# Patient Record
Sex: Female | Born: 1950 | Race: Black or African American | Hispanic: No | Smoking: Never smoker
Health system: Southern US, Community
[De-identification: ages and names within clinical notes are randomized; demographics above are authoritative.]

## PROBLEM LIST (undated history)

## (undated) DIAGNOSIS — G20 Parkinson's disease: Secondary | ICD-10-CM

## (undated) DIAGNOSIS — F32A Depression, unspecified: Secondary | ICD-10-CM

## (undated) DIAGNOSIS — G20A1 Parkinson's disease without dyskinesia, without mention of fluctuations: Secondary | ICD-10-CM

---

## 2018-10-24 ENCOUNTER — Ambulatory Visit: Payer: Medicare HMO | Admitting: Family Medicine

## 2019-03-07 ENCOUNTER — Ambulatory Visit: Payer: Medicare HMO | Admitting: Internal Medicine

## 2019-05-30 DIAGNOSIS — I10 Essential (primary) hypertension: Secondary | ICD-10-CM | POA: Diagnosis not present

## 2019-05-30 DIAGNOSIS — Z13220 Encounter for screening for lipoid disorders: Secondary | ICD-10-CM | POA: Diagnosis not present

## 2019-05-30 DIAGNOSIS — Z1329 Encounter for screening for other suspected endocrine disorder: Secondary | ICD-10-CM | POA: Diagnosis not present

## 2019-05-30 DIAGNOSIS — G20 Parkinson's disease: Secondary | ICD-10-CM | POA: Diagnosis not present

## 2019-05-30 DIAGNOSIS — Z131 Encounter for screening for diabetes mellitus: Secondary | ICD-10-CM | POA: Diagnosis not present

## 2019-05-30 DIAGNOSIS — Z7689 Persons encountering health services in other specified circumstances: Secondary | ICD-10-CM | POA: Diagnosis not present

## 2019-06-06 DIAGNOSIS — G20 Parkinson's disease: Secondary | ICD-10-CM | POA: Diagnosis not present

## 2019-06-06 DIAGNOSIS — F3342 Major depressive disorder, recurrent, in full remission: Secondary | ICD-10-CM | POA: Diagnosis not present

## 2019-06-06 DIAGNOSIS — Z7689 Persons encountering health services in other specified circumstances: Secondary | ICD-10-CM | POA: Diagnosis not present

## 2019-06-06 DIAGNOSIS — Z23 Encounter for immunization: Secondary | ICD-10-CM | POA: Diagnosis not present

## 2019-07-07 DIAGNOSIS — G20 Parkinson's disease: Secondary | ICD-10-CM | POA: Diagnosis not present

## 2019-08-19 DIAGNOSIS — Z23 Encounter for immunization: Secondary | ICD-10-CM | POA: Diagnosis not present

## 2019-11-27 DIAGNOSIS — G20 Parkinson's disease: Secondary | ICD-10-CM | POA: Diagnosis not present

## 2019-11-27 DIAGNOSIS — Z7689 Persons encountering health services in other specified circumstances: Secondary | ICD-10-CM | POA: Diagnosis not present

## 2019-11-27 DIAGNOSIS — F3342 Major depressive disorder, recurrent, in full remission: Secondary | ICD-10-CM | POA: Diagnosis not present

## 2019-12-04 DIAGNOSIS — F3340 Major depressive disorder, recurrent, in remission, unspecified: Secondary | ICD-10-CM | POA: Diagnosis not present

## 2019-12-04 DIAGNOSIS — Z780 Asymptomatic menopausal state: Secondary | ICD-10-CM | POA: Diagnosis not present

## 2019-12-04 DIAGNOSIS — Z0000 Encounter for general adult medical examination without abnormal findings: Secondary | ICD-10-CM | POA: Diagnosis not present

## 2019-12-04 DIAGNOSIS — L608 Other nail disorders: Secondary | ICD-10-CM | POA: Diagnosis not present

## 2019-12-04 DIAGNOSIS — G20 Parkinson's disease: Secondary | ICD-10-CM | POA: Diagnosis not present

## 2019-12-04 DIAGNOSIS — R634 Abnormal weight loss: Secondary | ICD-10-CM | POA: Diagnosis not present

## 2019-12-04 DIAGNOSIS — B351 Tinea unguium: Secondary | ICD-10-CM | POA: Diagnosis not present

## 2019-12-23 DIAGNOSIS — M8588 Other specified disorders of bone density and structure, other site: Secondary | ICD-10-CM | POA: Diagnosis not present

## 2020-01-23 DIAGNOSIS — R7989 Other specified abnormal findings of blood chemistry: Secondary | ICD-10-CM | POA: Diagnosis not present

## 2020-03-31 DIAGNOSIS — R634 Abnormal weight loss: Secondary | ICD-10-CM | POA: Diagnosis not present

## 2020-03-31 DIAGNOSIS — Z0000 Encounter for general adult medical examination without abnormal findings: Secondary | ICD-10-CM | POA: Diagnosis not present

## 2020-03-31 DIAGNOSIS — E042 Nontoxic multinodular goiter: Secondary | ICD-10-CM | POA: Diagnosis not present

## 2020-03-31 DIAGNOSIS — B351 Tinea unguium: Secondary | ICD-10-CM | POA: Diagnosis not present

## 2020-03-31 DIAGNOSIS — L608 Other nail disorders: Secondary | ICD-10-CM | POA: Diagnosis not present

## 2020-03-31 DIAGNOSIS — G20 Parkinson's disease: Secondary | ICD-10-CM | POA: Diagnosis not present

## 2020-04-07 DIAGNOSIS — G20 Parkinson's disease: Secondary | ICD-10-CM | POA: Diagnosis not present

## 2020-04-07 DIAGNOSIS — Z1211 Encounter for screening for malignant neoplasm of colon: Secondary | ICD-10-CM | POA: Diagnosis not present

## 2020-04-07 DIAGNOSIS — B351 Tinea unguium: Secondary | ICD-10-CM | POA: Diagnosis not present

## 2020-04-07 DIAGNOSIS — M8580 Other specified disorders of bone density and structure, unspecified site: Secondary | ICD-10-CM | POA: Diagnosis not present

## 2020-04-07 DIAGNOSIS — F3340 Major depressive disorder, recurrent, in remission, unspecified: Secondary | ICD-10-CM | POA: Diagnosis not present

## 2020-06-24 DIAGNOSIS — Z23 Encounter for immunization: Secondary | ICD-10-CM | POA: Diagnosis not present

## 2021-01-06 DIAGNOSIS — Z23 Encounter for immunization: Secondary | ICD-10-CM | POA: Diagnosis not present

## 2021-01-06 DIAGNOSIS — Z7185 Encounter for immunization safety counseling: Secondary | ICD-10-CM | POA: Diagnosis not present

## 2021-01-18 DIAGNOSIS — R7989 Other specified abnormal findings of blood chemistry: Secondary | ICD-10-CM | POA: Diagnosis not present

## 2021-01-18 DIAGNOSIS — Z1159 Encounter for screening for other viral diseases: Secondary | ICD-10-CM | POA: Diagnosis not present

## 2021-01-18 DIAGNOSIS — Z1329 Encounter for screening for other suspected endocrine disorder: Secondary | ICD-10-CM | POA: Diagnosis not present

## 2021-01-18 DIAGNOSIS — Z136 Encounter for screening for cardiovascular disorders: Secondary | ICD-10-CM | POA: Diagnosis not present

## 2021-01-18 DIAGNOSIS — Z125 Encounter for screening for malignant neoplasm of prostate: Secondary | ICD-10-CM | POA: Diagnosis not present

## 2021-01-18 DIAGNOSIS — Z0000 Encounter for general adult medical examination without abnormal findings: Secondary | ICD-10-CM | POA: Diagnosis not present

## 2021-01-18 DIAGNOSIS — Z131 Encounter for screening for diabetes mellitus: Secondary | ICD-10-CM | POA: Diagnosis not present

## 2021-01-18 DIAGNOSIS — E042 Nontoxic multinodular goiter: Secondary | ICD-10-CM | POA: Diagnosis not present

## 2021-02-03 ENCOUNTER — Emergency Department: Payer: PPO

## 2021-02-03 ENCOUNTER — Other Ambulatory Visit: Payer: Self-pay

## 2021-02-03 ENCOUNTER — Inpatient Hospital Stay
Admission: EM | Admit: 2021-02-03 | Discharge: 2021-02-08 | DRG: 565 | Disposition: A | Discharge status: Skilled Nursing Facility | Payer: PPO | Attending: Internal Medicine | Admitting: Internal Medicine

## 2021-02-03 DIAGNOSIS — R52 Pain, unspecified: Secondary | ICD-10-CM | POA: Diagnosis not present

## 2021-02-03 DIAGNOSIS — I2510 Atherosclerotic heart disease of native coronary artery without angina pectoris: Secondary | ICD-10-CM | POA: Diagnosis not present

## 2021-02-03 DIAGNOSIS — M47812 Spondylosis without myelopathy or radiculopathy, cervical region: Secondary | ICD-10-CM | POA: Diagnosis not present

## 2021-02-03 DIAGNOSIS — E559 Vitamin D deficiency, unspecified: Secondary | ICD-10-CM | POA: Diagnosis not present

## 2021-02-03 DIAGNOSIS — G609 Hereditary and idiopathic neuropathy, unspecified: Secondary | ICD-10-CM | POA: Diagnosis not present

## 2021-02-03 DIAGNOSIS — W19XXXD Unspecified fall, subsequent encounter: Secondary | ICD-10-CM | POA: Diagnosis not present

## 2021-02-03 DIAGNOSIS — F329 Major depressive disorder, single episode, unspecified: Secondary | ICD-10-CM | POA: Diagnosis not present

## 2021-02-03 DIAGNOSIS — E860 Dehydration: Secondary | ICD-10-CM | POA: Diagnosis present

## 2021-02-03 DIAGNOSIS — R609 Edema, unspecified: Secondary | ICD-10-CM | POA: Diagnosis not present

## 2021-02-03 DIAGNOSIS — R531 Weakness: Secondary | ICD-10-CM | POA: Diagnosis not present

## 2021-02-03 DIAGNOSIS — R0789 Other chest pain: Secondary | ICD-10-CM | POA: Diagnosis not present

## 2021-02-03 DIAGNOSIS — I739 Peripheral vascular disease, unspecified: Secondary | ICD-10-CM | POA: Diagnosis not present

## 2021-02-03 DIAGNOSIS — E538 Deficiency of other specified B group vitamins: Secondary | ICD-10-CM | POA: Diagnosis not present

## 2021-02-03 DIAGNOSIS — Z79899 Other long term (current) drug therapy: Secondary | ICD-10-CM | POA: Diagnosis not present

## 2021-02-03 DIAGNOSIS — Z20822 Contact with and (suspected) exposure to covid-19: Secondary | ICD-10-CM | POA: Diagnosis present

## 2021-02-03 DIAGNOSIS — R42 Dizziness and giddiness: Secondary | ICD-10-CM | POA: Diagnosis not present

## 2021-02-03 DIAGNOSIS — I6529 Occlusion and stenosis of unspecified carotid artery: Secondary | ICD-10-CM | POA: Diagnosis not present

## 2021-02-03 DIAGNOSIS — S2249XA Multiple fractures of ribs, unspecified side, initial encounter for closed fracture: Secondary | ICD-10-CM | POA: Diagnosis not present

## 2021-02-03 DIAGNOSIS — M8580 Other specified disorders of bone density and structure, unspecified site: Secondary | ICD-10-CM | POA: Diagnosis not present

## 2021-02-03 DIAGNOSIS — M6282 Rhabdomyolysis: Secondary | ICD-10-CM | POA: Diagnosis present

## 2021-02-03 DIAGNOSIS — G20 Parkinson's disease: Secondary | ICD-10-CM | POA: Diagnosis present

## 2021-02-03 DIAGNOSIS — D696 Thrombocytopenia, unspecified: Secondary | ICD-10-CM | POA: Diagnosis not present

## 2021-02-03 DIAGNOSIS — W06XXXA Fall from bed, initial encounter: Secondary | ICD-10-CM | POA: Diagnosis present

## 2021-02-03 DIAGNOSIS — I499 Cardiac arrhythmia, unspecified: Secondary | ICD-10-CM | POA: Diagnosis not present

## 2021-02-03 DIAGNOSIS — S2242XA Multiple fractures of ribs, left side, initial encounter for closed fracture: Secondary | ICD-10-CM

## 2021-02-03 DIAGNOSIS — G9389 Other specified disorders of brain: Secondary | ICD-10-CM | POA: Diagnosis not present

## 2021-02-03 DIAGNOSIS — S0990XA Unspecified injury of head, initial encounter: Secondary | ICD-10-CM | POA: Diagnosis not present

## 2021-02-03 DIAGNOSIS — W19XXXA Unspecified fall, initial encounter: Secondary | ICD-10-CM | POA: Diagnosis present

## 2021-02-03 DIAGNOSIS — G629 Polyneuropathy, unspecified: Secondary | ICD-10-CM | POA: Diagnosis present

## 2021-02-03 DIAGNOSIS — S2242XD Multiple fractures of ribs, left side, subsequent encounter for fracture with routine healing: Secondary | ICD-10-CM | POA: Diagnosis not present

## 2021-02-03 DIAGNOSIS — Y92003 Bedroom of unspecified non-institutional (private) residence as the place of occurrence of the external cause: Secondary | ICD-10-CM | POA: Diagnosis not present

## 2021-02-03 DIAGNOSIS — T796XXA Traumatic ischemia of muscle, initial encounter: Secondary | ICD-10-CM | POA: Diagnosis not present

## 2021-02-03 DIAGNOSIS — F331 Major depressive disorder, recurrent, moderate: Secondary | ICD-10-CM | POA: Diagnosis not present

## 2021-02-03 DIAGNOSIS — M47814 Spondylosis without myelopathy or radiculopathy, thoracic region: Secondary | ICD-10-CM | POA: Diagnosis not present

## 2021-02-03 DIAGNOSIS — S199XXA Unspecified injury of neck, initial encounter: Secondary | ICD-10-CM | POA: Diagnosis not present

## 2021-02-03 DIAGNOSIS — J439 Emphysema, unspecified: Secondary | ICD-10-CM | POA: Diagnosis not present

## 2021-02-03 DIAGNOSIS — Z743 Need for continuous supervision: Secondary | ICD-10-CM | POA: Diagnosis not present

## 2021-02-03 LAB — URINALYSIS, COMPLETE (UACMP) WITH MICROSCOPIC
Bacteria, UA: NONE SEEN
Bilirubin Urine: NEGATIVE
Glucose, UA: NEGATIVE mg/dL
Hgb urine dipstick: NEGATIVE
Ketones, ur: 20 mg/dL — AB
Leukocytes,Ua: NEGATIVE
Nitrite: NEGATIVE
Protein, ur: 100 mg/dL — AB
Specific Gravity, Urine: 1.028 (ref 1.005–1.030)
pH: 5 (ref 5.0–8.0)

## 2021-02-03 LAB — CBC
HCT: 39.1 % (ref 36.0–46.0)
Hemoglobin: 13.5 g/dL (ref 12.0–15.0)
MCH: 30.8 pg (ref 26.0–34.0)
MCHC: 34.5 g/dL (ref 30.0–36.0)
MCV: 89.1 fL (ref 80.0–100.0)
Platelets: 170 10*3/uL (ref 150–400)
RBC: 4.39 MIL/uL (ref 3.87–5.11)
RDW: 13.5 % (ref 11.5–15.5)
WBC: 5.2 10*3/uL (ref 4.0–10.5)
nRBC: 0 % (ref 0.0–0.2)

## 2021-02-03 LAB — TROPONIN I (HIGH SENSITIVITY): Troponin I (High Sensitivity): 5 ng/L (ref <18)

## 2021-02-03 LAB — BASIC METABOLIC PANEL
Anion gap: 11 (ref 5–15)
BUN: 22 mg/dL (ref 8–23)
CO2: 26 mmol/L (ref 22–32)
Calcium: 10 mg/dL (ref 8.9–10.3)
Chloride: 103 mmol/L (ref 98–111)
Creatinine, Ser: 0.71 mg/dL (ref 0.44–1.00)
GFR, Estimated: >60 mL/min (ref >60)
Glucose, Bld: 95 mg/dL (ref 70–99)
Potassium: 3.7 mmol/L (ref 3.5–5.1)
Sodium: 140 mmol/L (ref 135–145)

## 2021-02-03 LAB — RESP PANEL BY RT-PCR (FLU A&B, COVID) ARPGX2
Influenza A by PCR: NEGATIVE
Influenza B by PCR: NEGATIVE
SARS Coronavirus 2 by RT PCR: NEGATIVE

## 2021-02-03 LAB — CK: Total CK: 304 U/L — ABNORMAL HIGH (ref 38–234)

## 2021-02-03 MED ORDER — ACETAMINOPHEN 650 MG RE SUPP: 650.0000 mg | Freq: Four times a day (QID) | RECTAL | Status: DC | PRN

## 2021-02-03 MED ORDER — ACETAMINOPHEN 500 MG PO TABS
1000.0000 mg | ORAL_TABLET | Freq: Once | ORAL | Status: AC
  Administered 2021-02-03: 1000 mg via ORAL
  Filled 2021-02-03: qty 2

## 2021-02-03 MED ORDER — CARBIDOPA-LEVODOPA 25-100 MG PO TABS
1.0000 | ORAL_TABLET | Freq: Three times a day (TID) | ORAL | Status: DC
  Administered 2021-02-03 – 2021-02-08 (×15): 1 via ORAL
  Filled 2021-02-03 (×15): qty 1

## 2021-02-03 MED ORDER — ACETAMINOPHEN 325 MG PO TABS
650.0000 mg | ORAL_TABLET | Freq: Four times a day (QID) | ORAL | Status: DC | PRN
  Administered 2021-02-05 (×2): 650 mg via ORAL
  Filled 2021-02-03 (×2): qty 2

## 2021-02-03 MED ORDER — ONDANSETRON HCL 4 MG/2ML IJ SOLN: 4.0000 mg | Freq: Four times a day (QID) | INTRAMUSCULAR | Status: DC | PRN

## 2021-02-03 MED ORDER — MAGNESIUM HYDROXIDE 400 MG/5ML PO SUSP: 30.0000 mL | Freq: Every day | ORAL | Status: DC | PRN

## 2021-02-03 MED ORDER — ENOXAPARIN SODIUM 40 MG/0.4ML IJ SOSY
40.0000 mg | PREFILLED_SYRINGE | INTRAMUSCULAR | Status: DC
  Administered 2021-02-04 – 2021-02-07 (×5): 40 mg via SUBCUTANEOUS
  Filled 2021-02-03 (×6): qty 0.4

## 2021-02-03 MED ORDER — ONDANSETRON HCL 4 MG PO TABS
4.0000 mg | ORAL_TABLET | Freq: Four times a day (QID) | ORAL | Status: DC | PRN
  Filled 2021-02-03: qty 1

## 2021-02-03 MED ORDER — SODIUM CHLORIDE 0.9 % IV SOLN: INTRAVENOUS | Status: DC

## 2021-02-03 MED ORDER — CARBIDOPA-LEVODOPA 25-100 MG PO TABS: 1.0000 | ORAL_TABLET | Freq: Three times a day (TID) | ORAL | Status: DC

## 2021-02-03 MED ORDER — GABAPENTIN 100 MG PO CAPS
100.0000 mg | ORAL_CAPSULE | Freq: Three times a day (TID) | ORAL | Status: DC
  Administered 2021-02-04 – 2021-02-08 (×14): 100 mg via ORAL
  Filled 2021-02-03 (×14): qty 1

## 2021-02-03 MED ORDER — DULOXETINE HCL 30 MG PO CPEP
30.0000 mg | ORAL_CAPSULE | Freq: Two times a day (BID) | ORAL | Status: DC
  Administered 2021-02-04 – 2021-02-08 (×9): 30 mg via ORAL
  Filled 2021-02-03 (×11): qty 1

## 2021-02-03 MED ORDER — TRAZODONE HCL 50 MG PO TABS
25.0000 mg | ORAL_TABLET | Freq: Every evening | ORAL | Status: DC | PRN
Start: 2021-02-03 — End: 2021-02-08

## 2021-02-03 MED ORDER — MORPHINE SULFATE (PF) 2 MG/ML IV SOLN
2.0000 mg | Freq: Once | INTRAVENOUS | Status: DC
  Filled 2021-02-03: qty 1

## 2021-02-03 MED ORDER — SODIUM CHLORIDE 0.9 % IV BOLUS
1000.0000 mL | Freq: Once | INTRAVENOUS | Status: AC
  Administered 2021-02-03: 1000 mL via INTRAVENOUS

## 2021-02-03 NOTE — ED Triage Notes (Signed)
Please see first nurse note. Sunday rolled off bed injuring left side. Wed when trying to get up pt states unable to get up " like a disconnect like brain not working with the legs". Pt concerned for increased weakness.  Pt states weakness involves both sides. Pt reports intermittent pain on left side from falling out of bed. A&O x 4, NAD noted at this time.

## 2021-02-03 NOTE — ED Provider Notes (Signed)
[facility] Emergency Department Provider Note  ____________________________________________   Event Date/Time   First MD Initiated Contact with Patient 02/03/21 1611     (approximate)  I have reviewed the triage vital signs and the nursing notes.   HISTORY  Chief Complaint No chief complaint on file.    HPI Michele Brewer is a 70 y.o. female here with generalized weakness.  The patient states that for the last 2 days, she has had generalized weakness and difficulty with ambulating.  The patient reportedly fell out of her bed on Sunday.  She had some moderate left sided chest pain after the fall.  Since then, she has had persistent and worsening weakness and feels like she has been leaning to her left" favoring" her left side.  She states that for the last 2 days, she feels like she tries to get up and that her legs essentially just will not work.  She is able to move them, but is unable to muster the strength to stand up.  She has been in a chair for the last 12 hours due to this.  She has not had anything eat or drink because she can get up out of her bed.  Denies any vision changes.  No focal numbness or weakness.  She says she feels generally weak.  No fevers or chills.  No urinary symptoms.  No recent medication changes, though she does admit she has not been taking her Sinemet because she has not been eating or drinking.        History reviewed. No pertinent past medical history.  Patient Active Problem List   Diagnosis Date Noted  . Fall 02/03/2021    History reviewed. No pertinent surgical history.  Prior to Admission medications   Medication Sig Start Date End Date Taking? Authorizing Provider  carbidopa-levodopa (SINEMET IR) 25-100 MG tablet Take 1 tablet by mouth in the morning, at noon, and at bedtime. 09/08/19  Yes [provider]  DULoxetine (CYMBALTA) 30 MG capsule Take 1 capsule by mouth in the morning and at bedtime. 09/08/19  Yes  [provider]  gabapentin (NEURONTIN) 100 MG capsule Take 100 mg by mouth 3 (three) times daily. 09/08/19  Yes [provider]  cyanocobalamin (,VITAMIN B-12,) 1000 MCG/ML injection Inject into the muscle. Patient not taking: No sig reported    [provider]  ergocalciferol (VITAMIN D2) 1.25 MG (50000 UT) capsule Take 1 capsule by mouth once a week. Patient not taking: Reported on 02/03/2021 12/26/19   [provider]  Multiple Vitamin (MULTIVITAMIN) capsule Take 1 capsule by mouth. Take 1 capsule by mouth every other day Patient not taking: No sig reported    [provider]  olive oil external oil Take 1 capsule by mouth daily. Patient not taking: No sig reported    [provider]    Allergies Patient has no known allergies.  History reviewed. No pertinent family history.  Social History Social History   Tobacco Use  . Smoking status: Never Smoker  . Smokeless tobacco: Never Used  Substance Use Topics  . Alcohol use: Not Currently  . Drug use: Never    Review of Systems  Review of Systems  Constitutional: Positive for fatigue. Negative for fever.  HENT: Negative for congestion and sore throat.   Eyes: Negative for visual disturbance.  Respiratory: Negative for cough and shortness of breath.   Cardiovascular: Negative for chest pain.  Gastrointestinal: Negative for abdominal pain, diarrhea, nausea and  vomiting.  Genitourinary: Negative for flank pain.  Musculoskeletal: Positive for gait problem. Negative for back pain and neck pain.  Skin: Negative for rash and wound.  Neurological: Positive for weakness.  All other systems reviewed and are negative.    ____________________________________________  PHYSICAL EXAM:      VITAL SIGNS: ED Triage Vitals  Enc Vitals Group     BP 02/03/21 1427 123/64     Pulse Rate 02/03/21 1427 81     Resp 02/03/21 1427 16     Temp 02/03/21 1427 98.1 F (36.7 C)     Temp Source  02/03/21 1427 Oral     SpO2 02/03/21 1427 100 %     Weight 02/03/21 1439 130 lb (59 kg)     Height 02/03/21 1439 5\' 4"  (1.626 m)     Head Circumference --      Peak Flow --      Pain Score 02/03/21 1439 0     Pain Loc --      Pain Edu? --      Excl. in GC? --      Physical Exam Vitals and nursing note reviewed.  Constitutional:      General: She is not in acute distress.    Appearance: She is well-developed.  HENT:     Head: Normocephalic and atraumatic.     Mouth/Throat:     Mouth: Mucous membranes are dry.  Eyes:     Conjunctiva/sclera: Conjunctivae normal.  Cardiovascular:     Rate and Rhythm: Normal rate and regular rhythm.     Heart sounds: Normal heart sounds.  Pulmonary:     Effort: Pulmonary effort is normal. No respiratory distress.     Breath sounds: No wheezing.  Chest:     Chest wall: Tenderness (marked TTP over left lateral chest wall) present.     Comments: No bruising Abdominal:     General: There is no distension.  Musculoskeletal:     Cervical back: Neck supple.  Skin:    General: Skin is warm.     Capillary Refill: Capillary refill takes less than 2 seconds.     Findings: No rash.  Neurological:     Mental Status: She is alert and oriented to person, place, and time.     Motor: Tremor and abnormal muscle tone (mild rigidity) present.       ____________________________________________   LABS (all labs ordered are listed, but only abnormal results are displayed)  Labs Reviewed  URINALYSIS, COMPLETE (UACMP) WITH MICROSCOPIC - Abnormal; Notable for the following components:      Result Value   Color, Urine YELLOW (*)    APPearance CLEAR (*)    Ketones, ur 20 (*)    Protein, ur 100 (*)    All other components within normal limits  CK - Abnormal; Notable for the following components:   Total CK 304 (*)    All other components within normal limits  RESP PANEL BY RT-PCR (FLU A&B, COVID) [id number]  BASIC METABOLIC PANEL  CBC  BASIC METABOLIC  PANEL  CBC  CK  CBG MONITORING, ED  TROPONIN I (HIGH SENSITIVITY)  TROPONIN I (HIGH SENSITIVITY)    ____________________________________________  EKG: Normal sinus rhythm, VR 95. PR 98, QRS 80, QTc 472. No acute ST elevations or depressions. ________________________________________  RADIOLOGY All imaging, including plain films, CT scans, and ultrasounds, independently reviewed by me, and interpretations confirmed via formal radiology reads.  ED MD interpretation:   CXR: L 7th rib fx  CT Head/C Spine: No acute abnormality, no acute fx, spondylitic changes noted CT Chest: multiple L rib fx, no PTX or HTX  Official radiology report(s): CT Head Wo Contrast  Result Date: 02/03/2021 CLINICAL DATA:  Rolled from bed on Sunday, unable to get up EXAM: CT HEAD WITHOUT CONTRAST CT CERVICAL SPINE WITHOUT CONTRAST TECHNIQUE: Multidetector CT imaging of the head and cervical spine was performed following the standard protocol without intravenous contrast. Multiplanar CT image reconstructions of the cervical spine were also generated. COMPARISON:  None. FINDINGS: CT HEAD FINDINGS Brain: No evidence of acute infarction, hemorrhage, hydrocephalus, extra-axial collection, visible mass lesion or mass effect. Symmetric prominence of the ventricles, cisterns and sulci compatible with parenchymal volume loss with a slight frontal lobe predominance. Patchy areas of white matter hypoattenuation are most compatible with chronic microvascular angiopathy. Scattered dural calcifications. Insert tonsils normally position. Benign intracranial structures are unremarkable. Vascular: Atherosclerotic calcification of the carotid siphons. No hyperdense vessel. Skull: No significant scalp swelling, hematoma or calvarial fracture. Sinuses/Orbits: Paranasal sinuses and mastoid air cells are predominantly clear. Included orbital structures are unremarkable. Other: None CT CERVICAL SPINE FINDINGS Alignment: Stabilization collar  absent at the time of exam. Mild cervical flexion noted scout view. Right lateral bending noted on coronal view. There is slight leftward cranial rotation as well. Reversal the normal cervical lordosis likely related to cervical flexion and positioning. No evidence of traumatic listhesis. No abnormally widened, perched or jumped facets. Normal alignment of the craniocervical and atlantoaxial articulations. Skull base and vertebrae: No acute skull base fracture. No vertebral body fracture or height loss. Normal bone mineralization. Benign bone island C5. No worrisome osseous lesions. Soft tissues and spinal canal: No pre or paravertebral fluid or swelling. No visible canal hematoma. Disc levels: Multilevel intervertebral disc height loss with spondylitic endplate changes. Slightly larger disc osteophyte complex noted C4-5 with partial effacement of the ventral thecal sac but no significant spinal canal. Uncinate spurring and facet hypertrophic changes are noted throughout the cervical levels, resulting in some moderate foraminal narrowing on right C4-5 and bilaterally C6-7. Upper chest: No acute abnormality in the upper chest or imaged lung apices. Other: No concerning thyroid nodules or masses. Punctate focus of intravenous gas in the left brachiocephalic vein likely related to intravenous access. IMPRESSION: 1. No acute intracranial abnormality. Calvarial fracture or significant swelling. 2. 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 No acute fracture or traumatic listhesis of the cervical spine. 4. Cervical spondylitic changes as described above. Electronically Signed   By: Kreg Shropshire M.D.   On: 02/03/2021 17:53   CT Chest Wo Contrast  Result Date: 02/03/2021 CLINICAL DATA:  Recent fall with left rib fracture on recent plain film, initial encounter EXAM: CT CHEST WITHOUT CONTRAST TECHNIQUE: Multidetector CT imaging of the chest was performed following the standard protocol without  IV contrast. COMPARISON:  Chest x-ray from earlier in the same day. FINDINGS: Cardiovascular: Somewhat limited due to lack of IV contrast. Aortic calcifications are noted. Coronary calcifications are seen. No cardiac enlargement is noted. Mediastinum/Nodes: Thoracic inlet is within normal limits. No sizable hilar or mediastinal adenopathy is noted. The esophagus as visualized is within normal limits. Lungs/Pleura: Lungs are well aerated bilaterally. Mild emphysematous changes are seen. No pneumothorax is seen. No effusion is noted. A 4-5 mm nodule is noted in the anterior aspect of the left upper lobe best seen on image number 55 of series 3. No other sizable nodules are seen. Upper Abdomen: Visualized upper abdomen shows no  acute abnormality. Musculoskeletal: Degenerative changes of the thoracic spine are noted. Multiple left rib fractures are seen to include the seventh, eighth and ninth ribs laterally. These correspond with that seen on prior plain film examination. Again no pneumothorax is seen. IMPRESSION: Multiple left rib fractures without complicating factors. Small left upper lobe nodule as described. No follow-up needed if patient is low-risk. Non-contrast chest CT can be considered in 12 months if patient is high-risk. This recommendation follows the consensus statement: Guidelines for Management of Incidental Pulmonary Nodules Detected on CT Images: From the Fleischner Society 2017; Radiology 2017; 284:228-243. No other focal abnormality is seen. Aortic Atherosclerosis (ICD10-I70.0). Electronically Signed   By: Alcide Clever M.D.   On: 02/03/2021 19:18   CT Cervical Spine Wo Contrast  Result Date: 02/03/2021 CLINICAL DATA:  Rolled from bed on Sunday, unable to get up EXAM: CT HEAD WITHOUT CONTRAST CT CERVICAL SPINE WITHOUT CONTRAST TECHNIQUE: Multidetector CT imaging of the head and cervical spine was performed following the standard protocol without intravenous contrast. Multiplanar CT image  reconstructions of the cervical spine were also generated. COMPARISON:  None. FINDINGS: CT HEAD FINDINGS Brain: No evidence of acute infarction, hemorrhage, hydrocephalus, extra-axial collection, visible mass lesion or mass effect. Symmetric prominence of the ventricles, cisterns and sulci compatible with parenchymal volume loss with a slight frontal lobe predominance. Patchy areas of white matter hypoattenuation are most compatible with chronic microvascular angiopathy. Scattered dural calcifications. Insert tonsils normally position. Benign intracranial structures are unremarkable. Vascular: Atherosclerotic calcification of the carotid siphons. No hyperdense vessel. Skull: No significant scalp swelling, hematoma or calvarial fracture. Sinuses/Orbits: Paranasal sinuses and mastoid air cells are predominantly clear. Included orbital structures are unremarkable. Other: None CT CERVICAL SPINE FINDINGS Alignment: Stabilization collar absent at the time of exam. Mild cervical flexion noted scout view. Right lateral bending noted on coronal view. There is slight leftward cranial rotation as well. Reversal the normal cervical lordosis likely related to cervical flexion and positioning. No evidence of traumatic listhesis. No abnormally widened, perched or jumped facets. Normal alignment of the craniocervical and atlantoaxial articulations. Skull base and vertebrae: No acute skull base fracture. No vertebral body fracture or height loss. Normal bone mineralization. Benign bone island C5. No worrisome osseous lesions. Soft tissues and spinal canal: No pre or paravertebral fluid or swelling. No visible canal hematoma. Disc levels: Multilevel intervertebral disc height loss with spondylitic endplate changes. Slightly larger disc osteophyte complex noted C4-5 with partial effacement of the ventral thecal sac but no significant spinal canal. Uncinate spurring and facet hypertrophic changes are noted throughout the cervical  levels, resulting in some moderate foraminal narrowing on right C4-5 and bilaterally C6-7. Upper chest: No acute abnormality in the upper chest or imaged lung apices. Other: No concerning thyroid nodules or masses. Punctate focus of intravenous gas in the left brachiocephalic vein likely related to intravenous access. IMPRESSION: 1. No acute intracranial abnormality. Calvarial fracture or significant swelling. 2. 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 No acute fracture or traumatic listhesis of the cervical spine. 4. Cervical spondylitic changes as described above. Electronically Signed   By: Kreg Shropshire M.D.   On: 02/03/2021 17:53   DG Chest Portable 1 View  Result Date: 02/03/2021 CLINICAL DATA:  Weakness. EXAM: PORTABLE CHEST 1 VIEW COMPARISON:  None. FINDINGS: The heart size and mediastinal contours are within normal limits. Both lungs are clear. No pneumothorax or pleural effusion is noted. Minimally displaced left seventh rib fracture is noted. IMPRESSION: Minimally displaced left  seventh rib fracture. No acute cardiopulmonary abnormality. Electronically Signed   By: Lupita RaiderJames  Green Jr M.D.   On: 02/03/2021 17:29    ____________________________________________  PROCEDURES   Procedure(s) performed (including Critical Care):  Procedures  ____________________________________________  INITIAL IMPRESSION / MDM / ASSESSMENT AND PLAN / ED COURSE  As part of my medical decision making, I reviewed the following data within the electronic MEDICAL RECORD NUMBER Nursing notes reviewed and incorporated, Old chart reviewed, Notes from prior ED visits, and [place] Controlled Substance Database       *Michele HidesLinda Hawks was evaluated in Emergency Department on 02/04/2021 for the symptoms described in the history of present illness. She was evaluated in the context of the global COVID-19 pandemic, which necessitated consideration that the patient might be at risk for infection with the  SARS-CoV-2 virus that causes COVID-19. Institutional protocols and algorithms that pertain to the evaluation of patients at risk for COVID-19 are in a state of rapid change based on information released by regulatory bodies including the CDC and federal and state organizations. These policies and algorithms were followed during the patient's care in the ED.  Some ED evaluations and interventions may be delayed as a result of limited staffing during the pandemic.*     Medical Decision Making:  70 yo F here with generalized weakness, inability to ambulate after fall several days ago. Re: fall - CXR showed L rib fx, so CT obtained and shows three L-sided rib fx. No PTX or pulmonary contusions. CT Head/Cspine reviewed by me and are unremarkable. Re: her weakness, suspect this is 2/2 combination of her pain and deconditioning related to her rib fx, as well as some dehydration. She admits to poor PO intake and UA does show ketones, labs o/w reassuring. No significant AKI. CBC at baseline. COVID is negative. Pt also reportedly has not been taking her Sinemet 2/2 being unable to get out of her chair, and this could be contributing to her motor difficulty as well. Will admit.  ____________________________________________  FINAL CLINICAL IMPRESSION(S) / ED DIAGNOSES  Final diagnoses:  Dehydration  Generalized weakness  Closed fracture of multiple ribs of left side, initial encounter     MEDICATIONS GIVEN DURING THIS VISIT:  Medications  carbidopa-levodopa (SINEMET IR) 25-100 MG per tablet immediate release 1 tablet (1 tablet Oral Given 02/04/21 0110)  morphine 2 MG/ML injection 2 mg (2 mg Intravenous Not Given 02/03/21 2015)  DULoxetine (CYMBALTA) DR capsule 30 mg (30 mg Oral Patient Refused/Not Given 02/04/21 0155)  gabapentin (NEURONTIN) capsule 100 mg (100 mg Oral Given 02/04/21 0111)  enoxaparin (LOVENOX) injection 40 mg (has no administration in time range)  0.9 %  sodium chloride infusion (has no  administration in time range)  acetaminophen (TYLENOL) tablet 650 mg (has no administration in time range)    Or  acetaminophen (TYLENOL) suppository 650 mg (has no administration in time range)  traZODone (DESYREL) tablet 25 mg (has no administration in time range)  magnesium hydroxide (MILK OF MAGNESIA) suspension 30 mL (has no administration in time range)  ondansetron (ZOFRAN) tablet 4 mg (has no administration in time range)    Or  ondansetron (ZOFRAN) injection 4 mg (has no administration in time range)  sodium chloride 0.9 % bolus 1,000 mL (1,000 mLs Intravenous New Bag/Given 02/03/21 1738)  acetaminophen (TYLENOL) tablet 1,000 mg (1,000 mg Oral Given 02/03/21 1959)     ED Discharge Orders    None       Note:  This document was prepared using  [employer] and may include unintentional dictation errors.   Shaune Pollack, MD 02/04/21 0157

## 2021-02-03 NOTE — ED Triage Notes (Signed)
First Nurse Note:  Arrives from home via [facility] for c/o weakness.  Per report patient generally weak, hx parkinson's   300 ml NS via LAC #20g.  VS wnl  CBG:  104

## 2021-02-03 NOTE — H&P (Signed)
[place]   PATIENT NAME: Michele HidesLinda Rhinesmith    MR#:  [id number]  DATE OF BIRTH:  [birth year]  DATE OF ADMISSION:  02/03/2021  PRIMARY CARE PHYSICIAN: System, Provider Not In   Patient is coming from: Home.  REQUESTING/REFERRING PHYSICIAN: Shaune PollackIsaacs, [place], MD CHIEF COMPLAINT:  Generalized weakness  HISTORY OF PRESENT ILLNESS:  Michele Brewer is a 70 y.o. female with medical history significant for Parkinson's disease, osteopenia and major depressive disorder, who presented to the ER with acute onset of generalized weakness with difficulty with ambulation.  The patient apparently had a fall out of his bed on Sunday.  She had subsequent left-sided chest pain after a fall and since then has been progressively weaker and favoring her left side.  Over the last couple of days she was unable to ambulate.  She has been in a chair for the last 12 hours.  She subsequently was unable to eat or drink during that time.  No other paresthesias or focal muscle weakness.  She denies any chest pain or dyspnea or palpitations.  No fever or chills.  No nausea or vomiting or abdominal pain.  No dysuria, oliguria or hematuria or flank pain.  She has not taken her Sinemet or any of her medications during that time either.  ED Course: Upon presentation to the ER vital signs were within normal.  Labs revealed unremarkable BMP except for a BUN of 22 and CBC.  Her CK was 304 and high-sensitivity troponin was 5.  UA was unremarkable 413 and 20 ketones.  EKG as reviewed by me : Showed normal sinus rhythm with a rate of 95 with short PR interval and occasional premature ventricular complexes. Imaging: Chest x-ray showed minimally displaced left seventh rib fracture with no acute cardiopulmonary disease. Noncontrasted head CT scan revealed no acute intracranial normalities.  It showed background parenchymal volume loss and microvascular angiopathy.  C-spine CT showed no fracture or traumatic listhesis.  It showed  cervical spondylitic changes.  Chest CTA revealed multiple left rib fractures 7th-9th without complicating factors and a small left upper lobe nodule with recommendation for noncontrast chest CT in 12 months if patient is high risk.  It showed aortic atherosclerosis without other abnormalities.  The patient was given 1 g of p.o. Tylenol, 1 p.o. sentiment, 2 mg of IV morphine sulfate and 1 L bolus of IV normal saline.  She will be admitted to an observation medical bed for further valuation and management. PAST MEDICAL HISTORY:  Parkinson's disease Osteopenia Major depressive disorder  PAST SURGICAL HISTORY:  C-section  SOCIAL HISTORY:   Social History   Tobacco Use  . Smoking status: Never Smoker  . Smokeless tobacco: Never Used  Substance Use Topics  . Alcohol use: Not Currently  She denies any history of tobacco EtOH abuse or illicit drug use.  FAMILY HISTORY:  Her mother had TIAs  DRUG ALLERGIES:  No Known Allergies  REVIEW OF SYSTEMS:   ROS As per history of present illness. All pertinent systems were reviewed above. Constitutional, HEENT, cardiovascular, respiratory, GI, GU, musculoskeletal, neuro, psychiatric, endocrine, integumentary and hematologic systems were reviewed and are otherwise negative/unremarkable except for positive findings mentioned above in the HPI.   MEDICATIONS AT HOME:   Prior to Admission medications   Medication Sig Start Date End Date Taking? Authorizing Provider  carbidopa-levodopa (SINEMET IR) 25-100 MG tablet Take 1 tablet by mouth in the morning, at noon, and at bedtime. 09/08/19  Yes [provider]  DULoxetine (  CYMBALTA) 30 MG capsule Take 1 capsule by mouth in the morning and at bedtime. 09/08/19  Yes [provider]  gabapentin (NEURONTIN) 100 MG capsule Take 100 mg by mouth 3 (three) times daily. 09/08/19  Yes [provider]  cyanocobalamin (,VITAMIN B-12,) 1000 MCG/ML injection Inject into the  muscle. Patient not taking: No sig reported    [provider]  ergocalciferol (VITAMIN D2) 1.25 MG (50000 UT) capsule Take 1 capsule by mouth once a week. Patient not taking: Reported on 02/03/2021 12/26/19   [provider]  Multiple Vitamin (MULTIVITAMIN) capsule Take 1 capsule by mouth. Take 1 capsule by mouth every other day Patient not taking: No sig reported    [provider]  olive oil external oil Take 1 capsule by mouth daily. Patient not taking: No sig reported    [provider]      VITAL SIGNS:  Blood pressure 115/64, pulse 77, temperature 98.1 F (36.7 C), temperature source Oral, resp. rate 15, height  (1.626 m), weight 59 kg, SpO2 100 %.  PHYSICAL EXAMINATION:  Physical Exam  GENERAL:  70 y.o.-year-old African-American female patient lying in the bed with no acute distress.  EYES: Pupils equal, round, reactive to light and accommodation. No scleral icterus. Extraocular muscles intact.  HEENT: Head atraumatic, normocephalic. Oropharynx and nasopharynx clear.  NECK:  Supple, no jugular venous distention. No thyroid enlargement, no tenderness.  LUNGS: Normal breath sounds bilaterally, no wheezing, rales,rhonchi or crepitation. No use of accessory muscles of respiration.  CARDIOVASCULAR: Regular rate and rhythm, S1, S2 normal. No murmurs, rubs, or gallops.  ABDOMEN: Soft, nondistended, nontender. Bowel sounds present. No organomegaly or mass.  EXTREMITIES: No pedal edema, cyanosis, or clubbing.  NEUROLOGIC: Cranial nerves II through XII are intact. Muscle strength 5/5 in all extremities. Sensation intact. Gait not checked. Musculoskeletal: Left seventh, eighth and ninth ribs tenderness. PSYCHIATRIC: The patient is alert and oriented x 3.  Normal affect and good eye contact. SKIN: No obvious rash, lesion, or ulcer.   LABORATORY PANEL:   CBC Recent Labs  Lab 02/03/21 1447  WBC 5.2  HGB 13.5  HCT 39.1  PLT 170    ------------------------------------------------------------------------------------------------------------------  Chemistries  Recent Labs  Lab 02/03/21 1447  NA 140  K 3.7  CL 103  CO2 26  GLUCOSE 95  BUN 22  CREATININE 0.71  CALCIUM 10.0   ------------------------------------------------------------------------------------------------------------------  Cardiac Enzymes No results for input(s): TROPONINI in the last 168 hours. ------------------------------------------------------------------------------------------------------------------  RADIOLOGY:  CT Head Wo Contrast  Result Date: 02/03/2021 CLINICAL DATA:  Rolled from bed on Sunday, unable to get up EXAM: CT HEAD WITHOUT CONTRAST CT CERVICAL SPINE WITHOUT CONTRAST TECHNIQUE: Multidetector CT imaging of the head and cervical spine was performed following the standard protocol without intravenous contrast. Multiplanar CT image reconstructions of the cervical spine were also generated. COMPARISON:  None. FINDINGS: CT HEAD FINDINGS Brain: No evidence of acute infarction, hemorrhage, hydrocephalus, extra-axial collection, visible mass lesion or mass effect. Symmetric prominence of the ventricles, cisterns and sulci compatible with parenchymal volume loss with a slight frontal lobe predominance. Patchy areas of white matter hypoattenuation are most compatible with chronic microvascular angiopathy. Scattered dural calcifications. Insert tonsils normally position. Benign intracranial structures are unremarkable. Vascular: Atherosclerotic calcification of the carotid siphons. No hyperdense vessel. Skull: No significant scalp swelling, hematoma or calvarial fracture. Sinuses/Orbits: Paranasal sinuses and mastoid air cells are predominantly clear. Included orbital structures are unremarkable. Other: None CT CERVICAL SPINE FINDINGS Alignment: Stabilization collar absent at  the time of exam. Mild cervical flexion noted scout view. Right  lateral bending noted on coronal view. There is slight leftward cranial rotation as well. Reversal the normal cervical lordosis likely related to cervical flexion and positioning. No evidence of traumatic listhesis. No abnormally widened, perched or jumped facets. Normal alignment of the craniocervical and atlantoaxial articulations. Skull base and vertebrae: No acute skull base fracture. No vertebral body fracture or height loss. Normal bone mineralization. Benign bone island C5. No worrisome osseous lesions. Soft tissues and spinal canal: No pre or paravertebral fluid or swelling. No visible canal hematoma. Disc levels: Multilevel intervertebral disc height loss with spondylitic endplate changes. Slightly larger disc osteophyte complex noted C4-5 with partial effacement of the ventral thecal sac but no significant spinal canal. Uncinate spurring and facet hypertrophic changes are noted throughout the cervical levels, resulting in some moderate foraminal narrowing on right C4-5 and bilaterally C6-7. Upper chest: No acute abnormality in the upper chest or imaged lung apices. Other: No concerning thyroid nodules or masses. Punctate focus of intravenous gas in the left brachiocephalic vein likely related to intravenous access. IMPRESSION: 1. No acute intracranial abnormality. Calvarial fracture or significant swelling. 2. 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 No acute fracture or traumatic listhesis of the cervical spine. 4. Cervical spondylitic changes as described above. Electronically Signed   By: Kreg Shropshire M.D.   On: 02/03/2021 17:53   CT Chest Wo Contrast  Result Date: 02/03/2021 CLINICAL DATA:  Recent fall with left rib fracture on recent plain film, initial encounter EXAM: CT CHEST WITHOUT CONTRAST TECHNIQUE: Multidetector CT imaging of the chest was performed following the standard protocol without IV contrast. COMPARISON:  Chest x-ray from earlier in the same day.  FINDINGS: Cardiovascular: Somewhat limited due to lack of IV contrast. Aortic calcifications are noted. Coronary calcifications are seen. No cardiac enlargement is noted. Mediastinum/Nodes: Thoracic inlet is within normal limits. No sizable hilar or mediastinal adenopathy is noted. The esophagus as visualized is within normal limits. Lungs/Pleura: Lungs are well aerated bilaterally. Mild emphysematous changes are seen. No pneumothorax is seen. No effusion is noted. A 4-5 mm nodule is noted in the anterior aspect of the left upper lobe best seen on image number 55 of series 3. No other sizable nodules are seen. Upper Abdomen: Visualized upper abdomen shows no acute abnormality. Musculoskeletal: Degenerative changes of the thoracic spine are noted. Multiple left rib fractures are seen to include the seventh, eighth and ninth ribs laterally. These correspond with that seen on prior plain film examination. Again no pneumothorax is seen. IMPRESSION: Multiple left rib fractures without complicating factors. Small left upper lobe nodule as described. No follow-up needed if patient is low-risk. Non-contrast chest CT can be considered in 12 months if patient is high-risk. This recommendation follows the consensus statement: Guidelines for Management of Incidental Pulmonary Nodules Detected on CT Images: From the Fleischner Society 2017; Radiology 2017; 284:228-243. No other focal abnormality is seen. Aortic Atherosclerosis (ICD10-I70.0). Electronically Signed   By: Alcide Clever M.D.   On: 02/03/2021 19:18   CT Cervical Spine Wo Contrast  Result Date: 02/03/2021 CLINICAL DATA:  Rolled from bed on Sunday, unable to get up EXAM: CT HEAD WITHOUT CONTRAST CT CERVICAL SPINE WITHOUT CONTRAST TECHNIQUE: Multidetector CT imaging of the head and cervical spine was performed following the standard protocol without intravenous contrast. Multiplanar CT image reconstructions of the cervical spine were also generated. COMPARISON:   None. FINDINGS: CT HEAD FINDINGS Brain: No evidence  of acute infarction, hemorrhage, hydrocephalus, extra-axial collection, visible mass lesion or mass effect. Symmetric prominence of the ventricles, cisterns and sulci compatible with parenchymal volume loss with a slight frontal lobe predominance. Patchy areas of white matter hypoattenuation are most compatible with chronic microvascular angiopathy. Scattered dural calcifications. Insert tonsils normally position. Benign intracranial structures are unremarkable. Vascular: Atherosclerotic calcification of the carotid siphons. No hyperdense vessel. Skull: No significant scalp swelling, hematoma or calvarial fracture. Sinuses/Orbits: Paranasal sinuses and mastoid air cells are predominantly clear. Included orbital structures are unremarkable. Other: None CT CERVICAL SPINE FINDINGS Alignment: Stabilization collar absent at the time of exam. Mild cervical flexion noted scout view. Right lateral bending noted on coronal view. There is slight leftward cranial rotation as well. Reversal the normal cervical lordosis likely related to cervical flexion and positioning. No evidence of traumatic listhesis. No abnormally widened, perched or jumped facets. Normal alignment of the craniocervical and atlantoaxial articulations. Skull base and vertebrae: No acute skull base fracture. No vertebral body fracture or height loss. Normal bone mineralization. Benign bone island C5. No worrisome osseous lesions. Soft tissues and spinal canal: No pre or paravertebral fluid or swelling. No visible canal hematoma. Disc levels: Multilevel intervertebral disc height loss with spondylitic endplate changes. Slightly larger disc osteophyte complex noted C4-5 with partial effacement of the ventral thecal sac but no significant spinal canal. Uncinate spurring and facet hypertrophic changes are noted throughout the cervical levels, resulting in some moderate foraminal narrowing on right C4-5 and  bilaterally C6-7. Upper chest: No acute abnormality in the upper chest or imaged lung apices. Other: No concerning thyroid nodules or masses. Punctate focus of intravenous gas in the left brachiocephalic vein likely related to intravenous access. IMPRESSION: 1. No acute intracranial abnormality. Calvarial fracture or significant swelling. 2. 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 No acute fracture or traumatic listhesis of the cervical spine. 4. Cervical spondylitic changes as described above. Electronically Signed   By: Kreg Shropshire M.D.   On: 02/03/2021 17:53   DG Chest Portable 1 View  Result Date: 02/03/2021 CLINICAL DATA:  Weakness. EXAM: PORTABLE CHEST 1 VIEW COMPARISON:  None. FINDINGS: The heart size and mediastinal contours are within normal limits. Both lungs are clear. No pneumothorax or pleural effusion is noted. Minimally displaced left seventh rib fracture is noted. IMPRESSION: Minimally displaced left seventh rib fracture. No acute cardiopulmonary abnormality. Electronically Signed   By: Lupita Raider M.D.   On: 02/03/2021 17:29      IMPRESSION AND PLAN:  Active Problems:   Fall  1.  Fall with subsequent mild rhabdomyolysis, generalized weakness and deconditioning. - The patient admitted to a medical bed. - She will be hydrated with IV normal saline. - Physical therapy consult to be obtained to assess her ambulation. - Pain management will be provided.  2.  Left seventh, eighth and ninth rib fracture with associated chest pain. - Pain management will be provided. - We will monitor pulse oxsymmetry.  3.  Parkinson's disease. - We will continue her Sinemet.  4.  Major depression. - We will continue her Cymbalta.  5.  Peripheral neuropathy. - We will continue her Neurontin.  6.  Vitamin B12 deficiency. - We will continue her vitamin B12.   DVT prophylaxis: Lovenox. Code Status: full code. Family Communication:  The plan of care was  discussed in details with the patient (and family). I answered all questions. The patient agreed to proceed with the above mentioned plan. Further management will depend  upon hospital course. Disposition Plan: Back to previous home environment Consults called: none. All the records are reviewed and case discussed with ED provider.  Status is: Observation  The patient remains OBS appropriate and will d/c before 2 midnights.  Dispo: The patient is from: Home              Anticipated d/c is to: Home              Patient currently is not medically stable to d/c.   Difficult to place patient No   TOTAL TIME TAKING CARE OF THIS PATIENT: 55 minutes.    Hannah Beat M.D on 02/03/2021 at 9:30 PM  [facility]   From 7 PM-7 AM, contact night-coverage [email or web address]  CC: Primary care physician; System, Provider Not In

## 2021-02-04 DIAGNOSIS — S2249XA Multiple fractures of ribs, unspecified side, initial encounter for closed fracture: Secondary | ICD-10-CM | POA: Diagnosis not present

## 2021-02-04 DIAGNOSIS — Z79899 Other long term (current) drug therapy: Secondary | ICD-10-CM | POA: Diagnosis not present

## 2021-02-04 DIAGNOSIS — T796XXA Traumatic ischemia of muscle, initial encounter: Secondary | ICD-10-CM | POA: Diagnosis present

## 2021-02-04 DIAGNOSIS — M8580 Other specified disorders of bone density and structure, unspecified site: Secondary | ICD-10-CM | POA: Diagnosis present

## 2021-02-04 DIAGNOSIS — F329 Major depressive disorder, single episode, unspecified: Secondary | ICD-10-CM | POA: Diagnosis present

## 2021-02-04 DIAGNOSIS — R531 Weakness: Secondary | ICD-10-CM | POA: Diagnosis not present

## 2021-02-04 DIAGNOSIS — Y92003 Bedroom of unspecified non-institutional (private) residence as the place of occurrence of the external cause: Secondary | ICD-10-CM | POA: Diagnosis not present

## 2021-02-04 DIAGNOSIS — M6282 Rhabdomyolysis: Secondary | ICD-10-CM | POA: Diagnosis present

## 2021-02-04 DIAGNOSIS — G20 Parkinson's disease: Secondary | ICD-10-CM | POA: Diagnosis present

## 2021-02-04 DIAGNOSIS — G629 Polyneuropathy, unspecified: Secondary | ICD-10-CM | POA: Diagnosis present

## 2021-02-04 DIAGNOSIS — E538 Deficiency of other specified B group vitamins: Secondary | ICD-10-CM | POA: Diagnosis present

## 2021-02-04 DIAGNOSIS — S2242XA Multiple fractures of ribs, left side, initial encounter for closed fracture: Secondary | ICD-10-CM | POA: Diagnosis present

## 2021-02-04 DIAGNOSIS — D696 Thrombocytopenia, unspecified: Secondary | ICD-10-CM | POA: Diagnosis present

## 2021-02-04 DIAGNOSIS — W06XXXA Fall from bed, initial encounter: Secondary | ICD-10-CM | POA: Diagnosis present

## 2021-02-04 DIAGNOSIS — E860 Dehydration: Secondary | ICD-10-CM | POA: Diagnosis present

## 2021-02-04 DIAGNOSIS — Z20822 Contact with and (suspected) exposure to covid-19: Secondary | ICD-10-CM | POA: Diagnosis present

## 2021-02-04 DIAGNOSIS — G609 Hereditary and idiopathic neuropathy, unspecified: Secondary | ICD-10-CM | POA: Diagnosis not present

## 2021-02-04 LAB — BASIC METABOLIC PANEL
Anion gap: 8 (ref 5–15)
BUN: 23 mg/dL (ref 8–23)
CO2: 24 mmol/L (ref 22–32)
Calcium: 9.1 mg/dL (ref 8.9–10.3)
Chloride: 108 mmol/L (ref 98–111)
Creatinine, Ser: 0.59 mg/dL (ref 0.44–1.00)
GFR, Estimated: >60 mL/min (ref >60)
Glucose, Bld: 79 mg/dL (ref 70–99)
Potassium: 3.6 mmol/L (ref 3.5–5.1)
Sodium: 140 mmol/L (ref 135–145)

## 2021-02-04 LAB — CBC
HCT: 33 % — ABNORMAL LOW (ref 36.0–46.0)
Hemoglobin: 11.3 g/dL — ABNORMAL LOW (ref 12.0–15.0)
MCH: 31.4 pg (ref 26.0–34.0)
MCHC: 34.2 g/dL (ref 30.0–36.0)
MCV: 91.7 fL (ref 80.0–100.0)
Platelets: 145 10*3/uL — ABNORMAL LOW (ref 150–400)
RBC: 3.6 MIL/uL — ABNORMAL LOW (ref 3.87–5.11)
RDW: 13.7 % (ref 11.5–15.5)
WBC: 4.2 10*3/uL (ref 4.0–10.5)
nRBC: 0 % (ref 0.0–0.2)

## 2021-02-04 LAB — CK: Total CK: 517 U/L — ABNORMAL HIGH (ref 38–234)

## 2021-02-04 LAB — TROPONIN I (HIGH SENSITIVITY): Troponin I (High Sensitivity): 8 ng/L (ref <18)

## 2021-02-04 NOTE — Evaluation (Signed)
Physical Therapy Evaluation Patient Details Name: Michele Brewer MRN: [id number] DOB: 12/10/1950 Today's Date: 02/04/2021   History of Present Illness  Michele Brewer is a 70 y.o. female here with generalized weakness.  The patient states that for the last 2 days, she has had generalized weakness and difficulty with ambulating.  The patient reportedly fell out of her bed on Sunday.  She had some moderate left sided chest pain after the fall.  Since then, she has had persistent and worsening weakness and feels like she has been leaning to her left" favoring" her left side. Chest x-ray showed minimally displaced left seventh rib fracture with no acute cardiopulmonary disease.     Clinical Impression  Pt received in Semi-Fowler's position and agreeable to therapy.   Pt was able to perform bed-level exercises with good technique, but was cautiously performing due to pain in he L ribs from fall at home.  Pt unable to perform transfer from supine to sit without modA due to pain in her ribs.  Therapist assisted with sliding chuck pad to bring her to EOB.  Pt attempted STS x3 before needing elevated bed surface to come upright.  Pt then able to perform well, with minimal pain.  Pt then ambulated 700+ feet with no issues, but noted to start feeling fatigued after ~553ft.  Pt then returned to room where she requested to use the bathroom.  Pt performed self-care well with good technique but requested having nurse tech to come in to assist her with changing her clothes after soiling them.  Pt left with nursing and all questions and needs met.  Current recommendation of HHPT is solely dependent on her ability to perform supine to sit and sit-to-stand transfer without assistance due to her family member not being able to assist her.  Pt will benefit from skilled PT intervention to increase independence and safety with basic mobility in preparation for discharge to the venue listed below.        Follow Up  Recommendations Home health PT    Equipment Recommendations  Rolling walker with 5" wheels;3in1 (PT)    Recommendations for Other Services       Precautions / Restrictions        Mobility  Bed Mobility Overal bed mobility: Needs Assistance Bed Mobility: Supine to Sit     Supine to sit: Mod assist     General bed mobility comments: Pt requires modA from therapist to come into sitting position due to pain from L rib fx's.    Transfers Overall transfer level: Needs assistance Equipment used: Rolling walker (2 wheeled) Transfers: Sit to/from Stand Sit to Stand: Min assist         General transfer comment: Pt requires elevated surface to come upright into standing position due to pain from bending forward.  Ambulation/Gait Ambulation/Gait assistance: Min guard Gait Distance (Feet): 700 Feet Assistive device: Rolling walker (2 wheeled) Gait Pattern/deviations: WFL(Within Functional Limits) Gait velocity: decreased   General Gait Details: Pt has good gait mechanics, just has pain in ribs when rotating to turn the walker.  Stairs            Wheelchair Mobility    Modified Rankin (Stroke Patients Only)       Balance Overall balance assessment: Mild deficits observed, not formally tested  Pertinent Vitals/Pain      Home Living                        Prior Function                 Hand Dominance        Extremity/Trunk Assessment   Upper Extremity Assessment Upper Extremity Assessment: Defer to OT evaluation    Lower Extremity Assessment Lower Extremity Assessment: Generalized weakness       Communication      Cognition Arousal/Alertness: Awake/alert Behavior During Therapy: WFL for tasks assessed/performed Overall Cognitive Status: Within Functional Limits for tasks assessed                                 General Comments: Pt has hx of  Parkinson's and has tremors at rest.      General Comments General comments (skin integrity, edema, etc.): Pt has good balance in sitting, but has posterior and lateral lean due to rib fractures.    Exercises Total Joint Exercises Ankle Circles/Pumps: AROM;Strengthening;Both;10 reps;Supine Quad Sets: AROM;Strengthening;Both;10 reps;Supine Gluteal Sets: AROM;Strengthening;Both;10 reps;Supine Hip ABduction/ADduction: AROM;Strengthening;Both;10 reps;Supine Straight Leg Raises: AROM;Strengthening;Both;10 reps;Supine Marching in Standing: AROM;Strengthening;Both;10 reps;Standing Other Exercises Other Exercises: Pt educated on role of PT and services provided during hospital stay.  Pt also edcuated on the importance of exercising throughout stay and the physiologicla benefits of performing said exercises.   Assessment/Plan    PT Assessment Patient needs continued PT services  PT Problem List Decreased strength;Decreased activity tolerance;Decreased knowledge of use of DME;Decreased safety awareness;Pain       PT Treatment Interventions DME instruction    PT Goals (Current goals can be found in the Care Plan section)  Acute Rehab PT Goals Patient Stated Goal: For rib pain to go away PT Goal Formulation: With patient Time For Goal Achievement: 02/18/21 Potential to Achieve Goals: Fair    Frequency Min 2X/week   Barriers to discharge Decreased caregiver support Pt currently lives wih 35 y.o. mother and sister who is unable to assist pt.    Co-evaluation               AM-PAC PT "6 Clicks" Mobility  Outcome Measure Help needed turning from your back to your side while in a flat bed without using bedrails?: A Lot Help needed moving from lying on your back to sitting on the side of a flat bed without using bedrails?: A Lot Help needed moving to and from a bed to a chair (including a wheelchair)?: A Little Help needed standing up from a chair using your arms (e.g., wheelchair or  bedside chair)?: A Little Help needed to walk in hospital room?: A Little Help needed climbing 3-5 steps with a railing? : A Little 6 Click Score: 16    End of Session Equipment Utilized During Treatment: Gait belt Activity Tolerance: Patient tolerated treatment well;Patient limited by pain Patient left: in bed;with call bell/phone within reach;with bed alarm set Nurse Communication: Mobility status PT Visit Diagnosis: Unsteadiness on feet (R26.81);Other abnormalities of gait and mobility (R26.89);Repeated falls (R29.6);Muscle weakness (generalized) (M62.81);History of falling (Z91.81);Difficulty in walking, not elsewhere classified (R26.2)    Time: 4665-9935 PT Time Calculation (min) (ACUTE ONLY): 70 min   Charges:   PT Evaluation $PT Eval Low Complexity: 1 Low PT Treatments $Gait Training: 23-37 mins $Therapeutic Exercise: 8-22 mins $Self Care/Home Management: 8-22  Gwenlyn Saran, PT, DPT 02/04/21, 4:07 PM   Christie Nottingham 02/04/2021, 4:03 PM

## 2021-02-04 NOTE — Progress Notes (Signed)
PROGRESS NOTE    Michele Brewer  YNW:[id number] DOB: 1950/12/30 DOA: 02/03/2021 PCP: System, Provider Not In   Assessment & Plan:   Active Problems:   Fall  Rhabdomyolysis: likely secondary to fall at home. Continue on IVFs  Generalized weakness: PT/OT consulted   Left rib fractures: secondary to fall at home.    Parkinson's disease: continue on home dose of sinemet  Depression: severity unknown. Continue on home dose of cymbalta  Peripheral neuropathy: continue on home dose of gabapentin   Vitamin B12 deficiency: continue on vitamin B12 supplement   Thrombocytopenia: etiology unclear. Will continue to monitor   DVT prophylaxis: lovenox  Code Status: full  Family Communication:  Disposition Plan: depends on PT/OT recs   Level of care: Med-Surg   Status is: Inpatient  Remains inpatient appropriate because:Unsafe d/c plan, IV treatments appropriate due to intensity of illness or inability to take PO and Inpatient level of care appropriate due to severity of illness   Dispo: The patient is from: Home              Anticipated d/c is to: Home vs SNF              Patient currently is not medically stable to d/c.   Difficult to place patient : unclear         Consultants:    Procedures:   Antimicrobials:   Subjective: Pt c/o weakness  Objective: Vitals:   02/03/21 2315 02/03/21 2330 02/04/21 0115 02/04/21 0151  BP:   127/69 (!) 115/56  Pulse:   65 71  Resp: (!) 22 20  18   Temp:    97.7 F (36.5 C)  TempSrc:    Oral  SpO2:   100% 100%  Weight:      Height:        Intake/Output Summary (Last 24 hours) at 02/04/2021 0802 Last data filed at 02/04/2021 0319 Gross per 24 hour  Intake 115.6 ml  Output --  Net 115.6 ml   Filed Weights   02/03/21 1439  Weight: 59 kg    Examination:  General exam: Appears calm and comfortable  Respiratory system: Clear to auscultation. Respiratory effort normal. Cardiovascular system: S1 & S2 +. No  rubs,  gallops or clicks.  Gastrointestinal system: Abdomen is nondistended, soft and nontender.Normal bowel sounds heard. Central nervous system: Alert and oriented. Moves all extremities  Psychiatry: Judgement and insight appear normal. Mood & affect appropriate.     Data Reviewed: I have personally reviewed following labs and imaging studies  CBC: Recent Labs  Lab 02/03/21 1447 02/04/21 0509  WBC 5.2 4.2  HGB 13.5 11.3*  HCT 39.1 33.0*  MCV 89.1 91.7  PLT 170 145*   Basic Metabolic Panel: Recent Labs  Lab 02/03/21 1447 02/04/21 0509  NA 140 140  K 3.7 3.6  CL 103 108  CO2 26 24  GLUCOSE 95 79  BUN 22 23  CREATININE 0.71 0.59  CALCIUM 10.0 9.1   GFR: Estimated Creatinine Clearance: 55.7 mL/min (by C-G formula based on SCr of 0.59 mg/dL). Liver Function Tests: No results for input(s): AST, ALT, ALKPHOS, BILITOT, PROT, ALBUMIN in the last 168 hours. No results for input(s): LIPASE, AMYLASE in the last 168 hours. No results for input(s): AMMONIA in the last 168 hours. Coagulation Profile: No results for input(s): INR, PROTIME in the last 168 hours. Cardiac Enzymes: Recent Labs  Lab 02/03/21 1710 02/04/21 0509  CKTOTAL 304* 517*   BNP (last 3 results) No  results for input(s): PROBNP in the last 8760 hours. HbA1C: No results for input(s): HGBA1C in the last 72 hours. CBG: No results for input(s): GLUCAP in the last 168 hours. Lipid Profile: No results for input(s): CHOL, HDL, LDLCALC, TRIG, CHOLHDL, LDLDIRECT in the last 72 hours. Thyroid Function Tests: No results for input(s): TSH, T4TOTAL, FREET4, T3FREE, THYROIDAB in the last 72 hours. Anemia Panel: No results for input(s): VITAMINB12, FOLATE, FERRITIN, TIBC, IRON, RETICCTPCT in the last 72 hours. Sepsis Labs: No results for input(s): PROCALCITON, LATICACIDVEN in the last 168 hours.  Recent Results (from the past 240 hour(s))  Resp Panel by RT-PCR (Flu A&B, Covid) Nasopharyngeal Swab     Status: None    Collection Time: 02/03/21 10:31 PM   Specimen: Nasopharyngeal Swab; Nasopharyngeal(NP) swabs in vial transport medium  Result Value Ref Range Status   SARS Coronavirus 2 by RT PCR NEGATIVE NEGATIVE Final    Comment: (NOTE) SARS-CoV-2 target nucleic acids are NOT DETECTED.  The SARS-CoV-2 RNA is generally detectable in upper respiratory specimens during the acute phase of infection. The lowest concentration of SARS-CoV-2 viral copies this assay can detect is 138 copies/mL. A negative result does not preclude SARS-Cov-2 infection and should not be used as the sole basis for treatment or other patient management decisions. A negative result may occur with  improper specimen collection/handling, submission of specimen other than nasopharyngeal swab, presence of viral mutation(s) within the areas targeted by this assay, and inadequate number of viral copies(<138 copies/mL). A negative result must be combined with clinical observations, patient history, and epidemiological information. The expected result is Negative.  Fact Sheet for Patients:  BloggerCourse.com  Fact Sheet for Healthcare Providers:  SeriousBroker.it  This test is no t yet approved or cleared by the [place] FDA and  has been authorized for detection and/or diagnosis of SARS-CoV-2 by FDA under an Emergency Use Authorization (EUA). This EUA will remain  in effect (meaning this test can be used) for the duration of the COVID-19 declaration under Section 564(b)(1) of the Act, 21 U.S.C.section 360bbb-3(b)(1), unless the authorization is terminated  or revoked sooner.       Influenza A by PCR NEGATIVE NEGATIVE Final   Influenza B by PCR NEGATIVE NEGATIVE Final    Comment: (NOTE) The Xpert Xpress SARS-CoV-2/FLU/RSV plus assay is intended as an aid in the diagnosis of influenza from Nasopharyngeal swab specimens and should not be used as a sole basis for treatment.  Nasal washings and aspirates are unacceptable for Xpert Xpress SARS-CoV-2/FLU/RSV testing.  Fact Sheet for Patients: BloggerCourse.com  Fact Sheet for Healthcare Providers: SeriousBroker.it  This test is not yet approved or cleared by the [place] FDA and has been authorized for detection and/or diagnosis of SARS-CoV-2 by FDA under an Emergency Use Authorization (EUA). This EUA will remain in effect (meaning this test can be used) for the duration of the COVID-19 declaration under Section 564(b)(1) of the Act, 21 U.S.C. section 360bbb-3(b)(1), unless the authorization is terminated or revoked.  Performed at [facility], [address] [place], [place] [address]          Radiology Studies: CT Head Wo Contrast  Result Date: 02/03/2021 CLINICAL DATA:  Rolled from bed on Sunday, unable to get up EXAM: CT HEAD WITHOUT CONTRAST CT CERVICAL SPINE WITHOUT CONTRAST TECHNIQUE: Multidetector CT imaging of the head and cervical spine was performed following the standard protocol without intravenous contrast. Multiplanar CT image reconstructions of the cervical spine were also generated. COMPARISON:  None. FINDINGS: CT HEAD FINDINGS Brain: No evidence of acute infarction, hemorrhage, hydrocephalus, extra-axial collection, visible mass lesion or mass effect. Symmetric prominence of the ventricles, cisterns and sulci compatible with parenchymal volume loss with a slight frontal lobe predominance. Patchy areas of white matter hypoattenuation are most compatible with chronic microvascular angiopathy. Scattered dural calcifications. Insert tonsils normally position. Benign intracranial structures are unremarkable. Vascular: Atherosclerotic calcification of the carotid siphons. No hyperdense vessel. Skull: No significant scalp swelling, hematoma or calvarial fracture. Sinuses/Orbits: Paranasal sinuses and mastoid air cells are predominantly  clear. Included orbital structures are unremarkable. Other: None CT CERVICAL SPINE FINDINGS Alignment: Stabilization collar absent at the time of exam. Mild cervical flexion noted scout view. Right lateral bending noted on coronal view. There is slight leftward cranial rotation as well. Reversal the normal cervical lordosis likely related to cervical flexion and positioning. No evidence of traumatic listhesis. No abnormally widened, perched or jumped facets. Normal alignment of the craniocervical and atlantoaxial articulations. Skull base and vertebrae: No acute skull base fracture. No vertebral body fracture or height loss. Normal bone mineralization. Benign bone island C5. No worrisome osseous lesions. Soft tissues and spinal canal: No pre or paravertebral fluid or swelling. No visible canal hematoma. Disc levels: Multilevel intervertebral disc height loss with spondylitic endplate changes. Slightly larger disc osteophyte complex noted C4-5 with partial effacement of the ventral thecal sac but no significant spinal canal. Uncinate spurring and facet hypertrophic changes are noted throughout the cervical levels, resulting in some moderate foraminal narrowing on right C4-5 and bilaterally C6-7. Upper chest: No acute abnormality in the upper chest or imaged lung apices. Other: No concerning thyroid nodules or masses. Punctate focus of intravenous gas in the left brachiocephalic vein likely related to intravenous access. IMPRESSION: 1. No acute intracranial abnormality. Calvarial fracture or significant swelling. 2. 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 No acute fracture or traumatic listhesis of the cervical spine. 4. Cervical spondylitic changes as described above. Electronically Signed   By: Kreg Shropshire M.D.   On: 02/03/2021 17:53   CT Chest Wo Contrast  Result Date: 02/03/2021 CLINICAL DATA:  Recent fall with left rib fracture on recent plain film, initial encounter EXAM: CT  CHEST WITHOUT CONTRAST TECHNIQUE: Multidetector CT imaging of the chest was performed following the standard protocol without IV contrast. COMPARISON:  Chest x-ray from earlier in the same day. FINDINGS: Cardiovascular: Somewhat limited due to lack of IV contrast. Aortic calcifications are noted. Coronary calcifications are seen. No cardiac enlargement is noted. Mediastinum/Nodes: Thoracic inlet is within normal limits. No sizable hilar or mediastinal adenopathy is noted. The esophagus as visualized is within normal limits. Lungs/Pleura: Lungs are well aerated bilaterally. Mild emphysematous changes are seen. No pneumothorax is seen. No effusion is noted. A 4-5 mm nodule is noted in the anterior aspect of the left upper lobe best seen on image number 55 of series 3. No other sizable nodules are seen. Upper Abdomen: Visualized upper abdomen shows no acute abnormality. Musculoskeletal: Degenerative changes of the thoracic spine are noted. Multiple left rib fractures are seen to include the seventh, eighth and ninth ribs laterally. These correspond with that seen on prior plain film examination. Again no pneumothorax is seen. IMPRESSION: Multiple left rib fractures without complicating factors. Small left upper lobe nodule as described. No follow-up needed if patient is low-risk. Non-contrast chest CT can be considered in 12 months if patient is high-risk. This recommendation follows the consensus statement: Guidelines for Management of Incidental Pulmonary  Nodules Detected on CT Images: From the Fleischner Society 2017; Radiology 2017; [phone]. No other focal abnormality is seen. Aortic Atherosclerosis (ICD10-I70.0). Electronically Signed   By: Alcide Clever M.D.   On: 02/03/2021 19:18   CT Cervical Spine Wo Contrast  Result Date: 02/03/2021 CLINICAL DATA:  Rolled from bed on Sunday, unable to get up EXAM: CT HEAD WITHOUT CONTRAST CT CERVICAL SPINE WITHOUT CONTRAST TECHNIQUE: Multidetector CT imaging of the  head and cervical spine was performed following the standard protocol without intravenous contrast. Multiplanar CT image reconstructions of the cervical spine were also generated. COMPARISON:  None. FINDINGS: CT HEAD FINDINGS Brain: No evidence of acute infarction, hemorrhage, hydrocephalus, extra-axial collection, visible mass lesion or mass effect. Symmetric prominence of the ventricles, cisterns and sulci compatible with parenchymal volume loss with a slight frontal lobe predominance. Patchy areas of white matter hypoattenuation are most compatible with chronic microvascular angiopathy. Scattered dural calcifications. Insert tonsils normally position. Benign intracranial structures are unremarkable. Vascular: Atherosclerotic calcification of the carotid siphons. No hyperdense vessel. Skull: No significant scalp swelling, hematoma or calvarial fracture. Sinuses/Orbits: Paranasal sinuses and mastoid air cells are predominantly clear. Included orbital structures are unremarkable. Other: None CT CERVICAL SPINE FINDINGS Alignment: Stabilization collar absent at the time of exam. Mild cervical flexion noted scout view. Right lateral bending noted on coronal view. There is slight leftward cranial rotation as well. Reversal the normal cervical lordosis likely related to cervical flexion and positioning. No evidence of traumatic listhesis. No abnormally widened, perched or jumped facets. Normal alignment of the craniocervical and atlantoaxial articulations. Skull base and vertebrae: No acute skull base fracture. No vertebral body fracture or height loss. Normal bone mineralization. Benign bone island C5. No worrisome osseous lesions. Soft tissues and spinal canal: No pre or paravertebral fluid or swelling. No visible canal hematoma. Disc levels: Multilevel intervertebral disc height loss with spondylitic endplate changes. Slightly larger disc osteophyte complex noted C4-5 with partial effacement of the ventral thecal sac  but no significant spinal canal. Uncinate spurring and facet hypertrophic changes are noted throughout the cervical levels, resulting in some moderate foraminal narrowing on right C4-5 and bilaterally C6-7. Upper chest: No acute abnormality in the upper chest or imaged lung apices. Other: No concerning thyroid nodules or masses. Punctate focus of intravenous gas in the left brachiocephalic vein likely related to intravenous access. IMPRESSION: 1. No acute intracranial abnormality. Calvarial fracture or significant swelling. 2. 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 No acute fracture or traumatic listhesis of the cervical spine. 4. Cervical spondylitic changes as described above. Electronically Signed   By: Kreg Shropshire M.D.   On: 02/03/2021 17:53   DG Chest Portable 1 View  Result Date: 02/03/2021 CLINICAL DATA:  Weakness. EXAM: PORTABLE CHEST 1 VIEW COMPARISON:  None. FINDINGS: The heart size and mediastinal contours are within normal limits. Both lungs are clear. No pneumothorax or pleural effusion is noted. Minimally displaced left seventh rib fracture is noted. IMPRESSION: Minimally displaced left seventh rib fracture. No acute cardiopulmonary abnormality. Electronically Signed   By: Lupita Raider M.D.   On: 02/03/2021 17:29        Scheduled Meds: . carbidopa-levodopa  1 tablet Oral TID  . DULoxetine  30 mg Oral BID  . enoxaparin (LOVENOX) injection  40 mg Subcutaneous Q24H  . gabapentin  100 mg Oral TID  .  morphine injection  2 mg Intravenous Once   Continuous Infusions: . sodium chloride 100 mL/hr at 02/04/21 0319  LOS: 0 days    Time spent: 30 mins     Charise KillianJamiese M Jenniger Figiel, MD [facility] Pager [phone]  If 7PM-7AM, please contact night-coverage 02/04/2021, 8:02 AM

## 2021-02-04 NOTE — Evaluation (Signed)
Occupational Therapy Evaluation Patient Details Name: Michele Brewer MRN: [id number] DOB: [birth year] Today's Date: 02/04/2021    History of Present Illness Michele Brewer is a 70 y.o. female here with generalized weakness.  The patient states that for the last 2 days, she has had generalized weakness and difficulty with ambulating.  The patient reportedly fell out of her bed on Sunday.  She had some moderate left sided chest pain after the fall.  Since then, she has had persistent and worsening weakness and feels like she has been leaning to her left" favoring" her left side. Chest x-ray showed minimally displaced left seventh rib fracture with no acute cardiopulmonary disease.   Clinical Impression   Michele Brewer was seen for OT evaluation this date. 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 Pt lives in home with ramped entrance with her mother, reports her sister lives/works nearby. Pt presents to acute OT demonstrating impaired ADL performance and functional mobility 2/2 decreased activity tolerance, functional strength/ROM/balance deficits. Pt currently requires MIN A for toilet t/f and CGA for perihygiene. MIN A for LB access. CGA + RW for hand hygiene and UB bathing standing sinkside. Pt has 2 minor LOBS, requires stagger step to self-correct. MOD A sit>sup. Pt would benefit from skilled OT to address noted impairments and functional limitations (see below for any additional details) in order to maximize safety and independence while minimizing falls risk and caregiver burden. Upon hospital discharge, recommend STR to maximize pt safety and return to PLOF.     Follow Up Recommendations  SNF (pt unable to return to caregiver duties and family unable to provide assistance)   Equipment Recommendations  None recommended by OT    Recommendations for Other Services       Precautions / Restrictions Precautions Precautions:  Fall Restrictions Weight Bearing Restrictions: No      Mobility Bed Mobility Overal bed mobility: Needs Assistance Bed Mobility: Sit to Supine     Sit to supine: Mod assist   General bed mobility comments: L rib fx pain limiting    Transfers Overall transfer level: Needs assistance Equipment used: Rolling walker (2 wheeled) Transfers: Sit to/from Stand Sit to Stand: Min assist         General transfer comment: Pt requires elevated surface to come upright into standing position due to pain from bending forward.    Balance Overall balance assessment: Needs assistance Sitting-balance support: No upper extremity supported;Feet supported Sitting balance-Leahy Scale: Good     Standing balance support: No upper extremity supported;During functional activity Standing balance-Leahy Scale: Fair                             ADL either performed or assessed with clinical judgement   ADL Overall ADL's : Needs assistance/impaired                                       General ADL Comments: MIN A for toilet t/f and CGA for perihygiene. MIN A for LB access                  Pertinent Vitals/Pain Pain Assessment: Faces Faces Pain Scale: Hurts even more Pain Location: Denies pain at rest, but in significant pain if rolling/twisting to the L due to broken ribs on the L side. Pain  Descriptors / Indicators: Aching;Discomfort;Sharp;Sore Pain Intervention(s): Monitored during session;Limited activity within patient's tolerance;Repositioned     Hand Dominance Right   Extremity/Trunk Assessment Upper Extremity Assessment Upper Extremity Assessment: LUE deficits/detail LUE Deficits / Details: achieves ~45* shoulder abduction LUE: Unable to fully assess due to pain LUE Sensation: WNL LUE Coordination: WNL   Lower Extremity Assessment Lower Extremity Assessment: Generalized weakness       Communication Communication Communication: No  difficulties   Cognition Arousal/Alertness: Awake/alert Behavior During Therapy: WFL for tasks assessed/performed Overall Cognitive Status: Within Functional Limits for tasks assessed                                 General Comments: Pt has hx of Parkinson's and has tremors at rest.   General Comments  Pt has good balance in sitting, but has posterior and lateral lean due to rib fractures.    Exercises Exercises: Other exercises Total Joint Exercises Ankle Circles/Pumps: AROM;Strengthening;Both;10 reps;Supine Quad Sets: AROM;Strengthening;Both;10 reps;Supine Gluteal Sets: AROM;Strengthening;Both;10 reps;Supine Hip ABduction/ADduction: AROM;Strengthening;Both;10 reps;Supine Straight Leg Raises: AROM;Strengthening;Both;10 reps;Supine Marching in Standing: AROM;Strengthening;Both;10 reps;Standing Other Exercises Other Exercises: Pt educated re: OT role, DME recs, d/c recs, falls prevention Other Exercises: LBD , toileting, sit>sup, sit<>stand, sitting/standing balane/tolerance, bathing   Shoulder Instructions      Home Living Family/patient expects to be discharged to:: Private residence Living Arrangements: Parent (mother) Available Help at Discharge: Family;Available PRN/intermittently Type of Home: Mobile home Home Access: Ramped entrance     Home Layout: One level     Bathroom Shower/Tub: Producer, television/film/video: Standard Bathroom Accessibility: Yes   Home Equipment: Walker - standard;Cane - single point;Shower seat;Grab bars - toilet;Grab bars - tub/shower;Hand held shower head   Additional Comments: 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        OT Problem List: Decreased strength;Decreased range of motion;Decreased  activity tolerance;Impaired balance (sitting and/or standing);Decreased safety awareness      OT Treatment/Interventions: Self-care/ADL training;Therapeutic exercise;DME and/or AE instruction;Energy conservation;Therapeutic activities;Patient/family education;Balance training    OT Goals(Current goals can be found in the care plan section) Acute Rehab OT Goals Patient Stated Goal: For rib pain to go away OT Goal Formulation: With patient Time For Goal Achievement: 02/18/21 Potential to Achieve Goals: Good ADL Goals Pt Will Perform Grooming: Independently;standing Pt Will Perform Lower Body Dressing: with modified independence;sit to/from stand (c LRAD PRN\) Pt Will Transfer to Toilet: with modified independence;ambulating;regular height toilet (c LRAD PRN\)  OT Frequency: Min 1X/week   Barriers to D/C: Decreased caregiver support             AM-PAC OT "6 Clicks" Daily Activity     Outcome Measure Help from another person eating meals?: None Help from another person taking care of personal grooming?: A Little Help from another person toileting, which includes using toliet, bedpan, or urinal?: A Little Help from another person bathing (including washing, rinsing, drying)?: A Little Help from another person to put on and taking off regular upper body clothing?: A Little Help from another person to put on and taking off regular lower body clothing?: A Little 6 Click Score: 19   End of Session Equipment Utilized During Treatment: Rolling  walker Nurse Communication: Mobility status  Activity Tolerance: Patient tolerated treatment well Patient left: in bed;with call bell/phone within reach;with nursing/sitter in room  OT Visit Diagnosis: Other abnormalities of gait and mobility (R26.89);Muscle weakness (generalized) (M62.81)                Time: 4403-4742 OT Time Calculation (min): 41 min Charges:  OT General Charges $OT Visit: 1 Visit OT Evaluation $OT Eval Low Complexity: 1  Low OT Treatments $Self Care/Home Management : 23-37 mins  Kathie Dike, M.S. OTR/L  02/04/21, 4:25 PM  ascom [phone]

## 2021-02-05 LAB — CK: Total CK: 344 U/L — ABNORMAL HIGH (ref 38–234)

## 2021-02-05 LAB — CBC
HCT: 33.1 % — ABNORMAL LOW (ref 36.0–46.0)
Hemoglobin: 11.4 g/dL — ABNORMAL LOW (ref 12.0–15.0)
MCH: 31.3 pg (ref 26.0–34.0)
MCHC: 34.4 g/dL (ref 30.0–36.0)
MCV: 90.9 fL (ref 80.0–100.0)
Platelets: 134 10*3/uL — ABNORMAL LOW (ref 150–400)
RBC: 3.64 MIL/uL — ABNORMAL LOW (ref 3.87–5.11)
RDW: 13.4 % (ref 11.5–15.5)
WBC: 3.3 10*3/uL — ABNORMAL LOW (ref 4.0–10.5)
nRBC: 0 % (ref 0.0–0.2)

## 2021-02-05 LAB — BASIC METABOLIC PANEL
Anion gap: 4 — ABNORMAL LOW (ref 5–15)
BUN: 24 mg/dL — ABNORMAL HIGH (ref 8–23)
CO2: 26 mmol/L (ref 22–32)
Calcium: 8.9 mg/dL (ref 8.9–10.3)
Chloride: 108 mmol/L (ref 98–111)
Creatinine, Ser: 0.59 mg/dL (ref 0.44–1.00)
GFR, Estimated: >60 mL/min (ref >60)
Glucose, Bld: 73 mg/dL (ref 70–99)
Potassium: 3.7 mmol/L (ref 3.5–5.1)
Sodium: 138 mmol/L (ref 135–145)

## 2021-02-05 MED ORDER — OXYCODONE-ACETAMINOPHEN 5-325 MG PO TABS: 1.0000 | ORAL_TABLET | Freq: Four times a day (QID) | ORAL | Status: DC | PRN

## 2021-02-05 NOTE — Progress Notes (Addendum)
Physical Therapy Treatment Patient Details Name: Michele Brewer MRN: [id number] DOB: [birth year] Today's Date: 02/05/2021    History of Present Illness Michele Brewer is a 70 y.o. female here with generalized weakness.  The patient states that for the last 2 days, she has had generalized weakness and difficulty with ambulating.  The patient reportedly fell out of her bed on Sunday.  She had some moderate left sided chest pain after the fall.  Since then, she has had persistent and worsening weakness and feels like she has been leaning to her left" favoring" her left side. Chest x-ray showed minimally displaced left seventh rib fracture with no acute cardiopulmonary disease.    PT Comments    Pt was sitting in recliner upon arriving. Is alert and oriented x 4 and cooperative and motivated. Does endorse pain in ribs. Required min assist to stand with vcs for improved technique and sequencing. Pt ambulated 2 trials. Once with RW with CGA and once without AD with min assist. Pt does have difficulty and is not at baseline abilities. Pt ambulated at home with [facility] prior to admission. She would benefit from SNF at [place] to received more aggressive therapy while assisting pt to PLOF. She is agreeable to rehab and feels she does not have adequate assistance at home to safely return at this time. Acute PT changing recs to SNF. Will continue to follow and progress as able per POC.     Follow Up Recommendations  SNF     Equipment Recommendations  Rolling walker with 5" wheels;3in1 (PT)       Precautions / Restrictions Precautions Precautions: Fall Restrictions Weight Bearing Restrictions: No    Mobility  Bed Mobility Overal bed mobility: Needs Assistance Bed Mobility: Sit to Supine       Sit to supine: Mod assist   General bed mobility comments: Pt requires extensive assistance to return to bed from EOB short sit.    Transfers Overall transfer level: Needs assistance Equipment used: Rolling  walker (2 wheeled) Transfers: Sit to/from Stand Sit to Stand: Min assist         General transfer comment: Pt requires min assist to stand to RW. Increased time to perform.  Ambulation/Gait Ambulation/Gait assistance: Min guard;Min assist Gait Distance (Feet): 50 Feet Assistive device: Rolling walker (2 wheeled);None Gait Pattern/deviations: Step-through pattern Gait velocity: decreased   General Gait Details: Pt was CGA for use of RW but min assist without AD. pt uses SPC at baseline.       Balance Overall balance assessment: Needs assistance Sitting-balance support: No upper extremity supported;Feet supported Sitting balance-Leahy Scale: Good     Standing balance support: No upper extremity supported;During functional activity Standing balance-Leahy Scale: Poor Standing balance comment: pt is high fall risk without BUE support       Cognition Arousal/Alertness: Awake/alert Behavior During Therapy: WFL for tasks assessed/performed Overall Cognitive Status: Within Functional Limits for tasks assessed      General Comments: Pt has hx of Parkinson's and has tremors at rest.       Pertinent Vitals/Pain Pain Assessment: Faces Pain Score: 6  Pain Location: Denies pain at rest, but in significant pain if rolling/twisting to the L due to broken ribs on the L side. Pain Descriptors / Indicators: Aching;Discomfort;Sharp;Sore Pain Intervention(s): Limited activity within patient's tolerance;Monitored during session;Premedicated before session;Repositioned           PT Goals (current goals can now be found in the care plan section) Acute Rehab PT Goals Patient Stated  Goal: I want to return to ambulating with SPC Progress towards PT goals: Progressing toward goals    Frequency    Min 2X/week      PT Plan Discharge plan needs to be updated       AM-PAC PT "6 Clicks" Mobility   Outcome Measure  Help needed turning from your back to your side while in a flat  bed without using bedrails?: A Lot Help needed moving from lying on your back to sitting on the side of a flat bed without using bedrails?: A Lot Help needed moving to and from a bed to a chair (including a wheelchair)?: A Lot Help needed standing up from a chair using your arms (e.g., wheelchair or bedside chair)?: A Little Help needed to walk in hospital room?: A Lot Help needed climbing 3-5 steps with a railing? : A Lot 6 Click Score: 13    End of Session Equipment Utilized During Treatment: Gait belt Activity Tolerance: Patient tolerated treatment well Patient left: in bed;with call bell/phone within reach;with bed alarm set Nurse Communication: Mobility status PT Visit Diagnosis: Unsteadiness on feet (R26.81);Other abnormalities of gait and mobility (R26.89);Repeated falls (R29.6);Muscle weakness (generalized) (M62.81);History of falling (Z91.81);Difficulty in walking, not elsewhere classified (R26.2)     Time: 1440-1500 PT Time Calculation (min) (ACUTE ONLY): 20 min  Charges:  $Gait Training: 8-22 mins                     Jetta Lout PTA 02/05/21, 3:23 PM

## 2021-02-05 NOTE — Progress Notes (Signed)
PROGRESS NOTE    Michele HidesLinda Dazey  WRU:[id number] DOB: 17-Feb-1951 DOA: 02/03/2021 PCP: System, Provider Not In   Assessment & Plan:   Active Problems:   Fall   Rhabdomyolysis  Rhabdomyolysis: likely secondary to fall at home. CK is still elevated but trending down. Improving. Cr is WNL   Generalized weakness: OT recs SNF. PT recs home health   Left rib fractures: secondary to fall at home. Percocet prn. Encourage incentive spirometry   Parkinson's disease: continue on home dose carbidopa-levodopa   Depression: severity unknown. Continue on home dose of duloxetine   Peripheral neuropathy: continue on home dose of gabapentin   Vitamin B12 deficiency: continue on B12 supplement   Thrombocytopenia: etiology unclear. Will continue to monitor   DVT prophylaxis: lovenox  Code Status: full  Family Communication:  Disposition Plan: SNF vs HH. CM is aware   Level of care: Med-Surg   Status is: Inpatient  Remains inpatient appropriate because:Unsafe d/c plan, IV treatments appropriate due to intensity of illness or inability to take PO and Inpatient level of care appropriate due to severity of illness   Dispo: The patient is from: Home              Anticipated d/c is to: Home vs SNF              Patient currently is not medically stable to d/c.   Difficult to place patient : unclear         Consultants:    Procedures:   Antimicrobials:   Subjective: Pt c/o not being able to sit up & get out of bed by herself  Objective: Vitals:   02/04/21 1648 02/04/21 2016 02/05/21 0037 02/05/21 0518  BP: (!) 117/56 124/65 124/72 118/63  Pulse: 71 (!) 57 (!) 57 (!) 55  Resp: 16 16 16 16   Temp: 97.8 F (36.6 C) 97.9 F (36.6 C) 97.6 F (36.4 C) 98 F (36.7 C)  TempSrc:  Oral Oral Oral  SpO2: 100% 99% 99% 100%  Weight:      Height:        Intake/Output Summary (Last 24 hours) at 02/05/2021 0759 Last data filed at 02/04/2021 1700 Gross per 24 hour  Intake 600 ml   Output 100 ml  Net 500 ml   Filed Weights   02/03/21 1439  Weight: 59 kg    Examination:  General exam: Appears comfortable   Respiratory system: clear breath sounds b/l  Cardiovascular system: S1/S2+. No rubs or clicks  Gastrointestinal system: Abd is soft, NT, ND, & hypoactive bowel sounds  Central nervous system: Alert and oriented. Moves all extremities  Psychiatry: Judgement and insight appear normal. Flat mood and affect     Data Reviewed: I have personally reviewed following labs and imaging studies  CBC: Recent Labs  Lab 02/03/21 1447 02/04/21 0509 02/05/21 0512  WBC 5.2 4.2 3.3*  HGB 13.5 11.3* 11.4*  HCT 39.1 33.0* 33.1*  MCV 89.1 91.7 90.9  PLT 170 145* 134*   Basic Metabolic Panel: Recent Labs  Lab 02/03/21 1447 02/04/21 0509 02/05/21 0512  NA 140 140 138  K 3.7 3.6 3.7  CL 103 108 108  CO2 26 24 26   GLUCOSE 95 79 73  BUN 22 23 24*  CREATININE 0.71 0.59 0.59  CALCIUM 10.0 9.1 8.9   GFR: Estimated Creatinine Clearance: 55.7 mL/min (by C-G formula based on SCr of 0.59 mg/dL). Liver Function Tests: No results for input(s): AST, ALT, ALKPHOS, BILITOT, PROT, ALBUMIN in the  last 168 hours. No results for input(s): LIPASE, AMYLASE in the last 168 hours. No results for input(s): AMMONIA in the last 168 hours. Coagulation Profile: No results for input(s): INR, PROTIME in the last 168 hours. Cardiac Enzymes: Recent Labs  Lab 02/03/21 1710 02/04/21 0509 02/05/21 0512  CKTOTAL 304* 517* 344*   BNP (last 3 results) No results for input(s): PROBNP in the last 8760 hours. HbA1C: No results for input(s): HGBA1C in the last 72 hours. CBG: No results for input(s): GLUCAP in the last 168 hours. Lipid Profile: No results for input(s): CHOL, HDL, LDLCALC, TRIG, CHOLHDL, LDLDIRECT in the last 72 hours. Thyroid Function Tests: No results for input(s): TSH, T4TOTAL, FREET4, T3FREE, THYROIDAB in the last 72 hours. Anemia Panel: No results for input(s):  VITAMINB12, FOLATE, FERRITIN, TIBC, IRON, RETICCTPCT in the last 72 hours. Sepsis Labs: No results for input(s): PROCALCITON, LATICACIDVEN in the last 168 hours.  Recent Results (from the past 240 hour(s))  Resp Panel by RT-PCR (Flu A&B, Covid) Nasopharyngeal Swab     Status: None   Collection Time: 02/03/21 10:31 PM   Specimen: Nasopharyngeal Swab; Nasopharyngeal(NP) swabs in vial transport medium  Result Value Ref Range Status   SARS Coronavirus 2 by RT PCR NEGATIVE NEGATIVE Final    Comment: (NOTE) SARS-CoV-2 target nucleic acids are NOT DETECTED.  The SARS-CoV-2 RNA is generally detectable in upper respiratory specimens during the acute phase of infection. The lowest concentration of SARS-CoV-2 viral copies this assay can detect is 138 copies/mL. A negative result does not preclude SARS-Cov-2 infection and should not be used as the sole basis for treatment or other patient management decisions. A negative result may occur with  improper specimen collection/handling, submission of specimen other than nasopharyngeal swab, presence of viral mutation(s) within the areas targeted by this assay, and inadequate number of viral copies(<138 copies/mL). A negative result must be combined with clinical observations, patient history, and epidemiological information. The expected result is Negative.  Fact Sheet for Patients:  BloggerCourse.com  Fact Sheet for Healthcare Providers:  SeriousBroker.it  This test is no t yet approved or cleared by the [place] FDA and  has been authorized for detection and/or diagnosis of SARS-CoV-2 by FDA under an Emergency Use Authorization (EUA). This EUA will remain  in effect (meaning this test can be used) for the duration of the COVID-19 declaration under Section 564(b)(1) of the Act, 21 U.S.C.section 360bbb-3(b)(1), unless the authorization is terminated  or revoked sooner.       Influenza A  by PCR NEGATIVE NEGATIVE Final   Influenza B by PCR NEGATIVE NEGATIVE Final    Comment: (NOTE) The Xpert Xpress SARS-CoV-2/FLU/RSV plus assay is intended as an aid in the diagnosis of influenza from Nasopharyngeal swab specimens and should not be used as a sole basis for treatment. Nasal washings and aspirates are unacceptable for Xpert Xpress SARS-CoV-2/FLU/RSV testing.  Fact Sheet for Patients: BloggerCourse.com  Fact Sheet for Healthcare Providers: SeriousBroker.it  This test is not yet approved or cleared by the [place] FDA and has been authorized for detection and/or diagnosis of SARS-CoV-2 by FDA under an Emergency Use Authorization (EUA). This EUA will remain in effect (meaning this test can be used) for the duration of the COVID-19 declaration under Section 564(b)(1) of the Act, 21 U.S.C. section 360bbb-3(b)(1), unless the authorization is terminated or revoked.  Performed at [facility], [address], [place], [place] [address]          Radiology Studies: CT  Head Wo Contrast  Result Date: 02/03/2021 CLINICAL DATA:  Rolled from bed on Sunday, unable to get up EXAM: CT HEAD WITHOUT CONTRAST CT CERVICAL SPINE WITHOUT CONTRAST TECHNIQUE: Multidetector CT imaging of the head and cervical spine was performed following the standard protocol without intravenous contrast. Multiplanar CT image reconstructions of the cervical spine were also generated. COMPARISON:  None. FINDINGS: CT HEAD FINDINGS Brain: No evidence of acute infarction, hemorrhage, hydrocephalus, extra-axial collection, visible mass lesion or mass effect. Symmetric prominence of the ventricles, cisterns and sulci compatible with parenchymal volume loss with a slight frontal lobe predominance. Patchy areas of white matter hypoattenuation are most compatible with chronic microvascular angiopathy. Scattered dural calcifications. Insert tonsils normally  position. Benign intracranial structures are unremarkable. Vascular: Atherosclerotic calcification of the carotid siphons. No hyperdense vessel. Skull: No significant scalp swelling, hematoma or calvarial fracture. Sinuses/Orbits: Paranasal sinuses and mastoid air cells are predominantly clear. Included orbital structures are unremarkable. Other: None CT CERVICAL SPINE FINDINGS Alignment: Stabilization collar absent at the time of exam. Mild cervical flexion noted scout view. Right lateral bending noted on coronal view. There is slight leftward cranial rotation as well. Reversal the normal cervical lordosis likely related to cervical flexion and positioning. No evidence of traumatic listhesis. No abnormally widened, perched or jumped facets. Normal alignment of the craniocervical and atlantoaxial articulations. Skull base and vertebrae: No acute skull base fracture. No vertebral body fracture or height loss. Normal bone mineralization. Benign bone island C5. No worrisome osseous lesions. Soft tissues and spinal canal: No pre or paravertebral fluid or swelling. No visible canal hematoma. Disc levels: Multilevel intervertebral disc height loss with spondylitic endplate changes. Slightly larger disc osteophyte complex noted C4-5 with partial effacement of the ventral thecal sac but no significant spinal canal. Uncinate spurring and facet hypertrophic changes are noted throughout the cervical levels, resulting in some moderate foraminal narrowing on right C4-5 and bilaterally C6-7. Upper chest: No acute abnormality in the upper chest or imaged lung apices. Other: No concerning thyroid nodules or masses. Punctate focus of intravenous gas in the left brachiocephalic vein likely related to intravenous access. IMPRESSION: 1. No acute intracranial abnormality. Calvarial fracture or significant swelling. 2. 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 No acute fracture or traumatic listhesis of the  cervical spine. 4. Cervical spondylitic changes as described above. Electronically Signed   By: Kreg Shropshire M.D.   On: 02/03/2021 17:53   CT Chest Wo Contrast  Result Date: 02/03/2021 CLINICAL DATA:  Recent fall with left rib fracture on recent plain film, initial encounter EXAM: CT CHEST WITHOUT CONTRAST TECHNIQUE: Multidetector CT imaging of the chest was performed following the standard protocol without IV contrast. COMPARISON:  Chest x-ray from earlier in the same day. FINDINGS: Cardiovascular: Somewhat limited due to lack of IV contrast. Aortic calcifications are noted. Coronary calcifications are seen. No cardiac enlargement is noted. Mediastinum/Nodes: Thoracic inlet is within normal limits. No sizable hilar or mediastinal adenopathy is noted. The esophagus as visualized is within normal limits. Lungs/Pleura: Lungs are well aerated bilaterally. Mild emphysematous changes are seen. No pneumothorax is seen. No effusion is noted. A 4-5 mm nodule is noted in the anterior aspect of the left upper lobe best seen on image number 55 of series 3. No other sizable nodules are seen. Upper Abdomen: Visualized upper abdomen shows no acute abnormality. Musculoskeletal: Degenerative changes of the thoracic spine are noted. Multiple left rib fractures are seen to include the seventh, eighth and ninth ribs laterally. These  correspond with that seen on prior plain film examination. Again no pneumothorax is seen. IMPRESSION: Multiple left rib fractures without complicating factors. Small left upper lobe nodule as described. No follow-up needed if patient is low-risk. Non-contrast chest CT can be considered in 12 months if patient is high-risk. This recommendation follows the consensus statement: Guidelines for Management of Incidental Pulmonary Nodules Detected on CT Images: From the Fleischner Society 2017; Radiology 2017; 284:228-243. No other focal abnormality is seen. Aortic Atherosclerosis (ICD10-I70.0).  Electronically Signed   By: Alcide Clever M.D.   On: 02/03/2021 19:18   CT Cervical Spine Wo Contrast  Result Date: 02/03/2021 CLINICAL DATA:  Rolled from bed on Sunday, unable to get up EXAM: CT HEAD WITHOUT CONTRAST CT CERVICAL SPINE WITHOUT CONTRAST TECHNIQUE: Multidetector CT imaging of the head and cervical spine was performed following the standard protocol without intravenous contrast. Multiplanar CT image reconstructions of the cervical spine were also generated. COMPARISON:  None. FINDINGS: CT HEAD FINDINGS Brain: No evidence of acute infarction, hemorrhage, hydrocephalus, extra-axial collection, visible mass lesion or mass effect. Symmetric prominence of the ventricles, cisterns and sulci compatible with parenchymal volume loss with a slight frontal lobe predominance. Patchy areas of white matter hypoattenuation are most compatible with chronic microvascular angiopathy. Scattered dural calcifications. Insert tonsils normally position. Benign intracranial structures are unremarkable. Vascular: Atherosclerotic calcification of the carotid siphons. No hyperdense vessel. Skull: No significant scalp swelling, hematoma or calvarial fracture. Sinuses/Orbits: Paranasal sinuses and mastoid air cells are predominantly clear. Included orbital structures are unremarkable. Other: None CT CERVICAL SPINE FINDINGS Alignment: Stabilization collar absent at the time of exam. Mild cervical flexion noted scout view. Right lateral bending noted on coronal view. There is slight leftward cranial rotation as well. Reversal the normal cervical lordosis likely related to cervical flexion and positioning. No evidence of traumatic listhesis. No abnormally widened, perched or jumped facets. Normal alignment of the craniocervical and atlantoaxial articulations. Skull base and vertebrae: No acute skull base fracture. No vertebral body fracture or height loss. Normal bone mineralization. Benign bone island C5. No worrisome osseous  lesions. Soft tissues and spinal canal: No pre or paravertebral fluid or swelling. No visible canal hematoma. Disc levels: Multilevel intervertebral disc height loss with spondylitic endplate changes. Slightly larger disc osteophyte complex noted C4-5 with partial effacement of the ventral thecal sac but no significant spinal canal. Uncinate spurring and facet hypertrophic changes are noted throughout the cervical levels, resulting in some moderate foraminal narrowing on right C4-5 and bilaterally C6-7. Upper chest: No acute abnormality in the upper chest or imaged lung apices. Other: No concerning thyroid nodules or masses. Punctate focus of intravenous gas in the left brachiocephalic vein likely related to intravenous access. IMPRESSION: 1. No acute intracranial abnormality. Calvarial fracture or significant swelling. 2. 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 No acute fracture or traumatic listhesis of the cervical spine. 4. Cervical spondylitic changes as described above. Electronically Signed   By: Kreg Shropshire M.D.   On: 02/03/2021 17:53   DG Chest Portable 1 View  Result Date: 02/03/2021 CLINICAL DATA:  Weakness. EXAM: PORTABLE CHEST 1 VIEW COMPARISON:  None. FINDINGS: The heart size and mediastinal contours are within normal limits. Both lungs are clear. No pneumothorax or pleural effusion is noted. Minimally displaced left seventh rib fracture is noted. IMPRESSION: Minimally displaced left seventh rib fracture. No acute cardiopulmonary abnormality. Electronically Signed   By: Lupita Raider M.D.   On: 02/03/2021 17:29  Scheduled Meds: . carbidopa-levodopa  1 tablet Oral TID  . DULoxetine  30 mg Oral BID  . enoxaparin (LOVENOX) injection  40 mg Subcutaneous Q24H  . gabapentin  100 mg Oral TID  .  morphine injection  2 mg Intravenous Once   Continuous Infusions: . sodium chloride 100 mL/hr at 02/05/21 0034     LOS: 1 day    Time spent: 33 mins      Charise Killian, MD [facility] Pager [phone]  If 7PM-7AM, please contact night-coverage 02/05/2021, 7:59 AM

## 2021-02-05 NOTE — Plan of Care (Signed)

## 2021-02-05 NOTE — TOC Initial Note (Addendum)
Transition of Care [facility]) - Initial/Assessment Note    Patient Details  Name: Michele Brewer MRN: [id number] Date of Birth: 12-Jul-1951  Transition of Care [facility]) CM/SW Contact:    Michele Cline, LCSW Phone Number: 02/05/2021, 1:21 PM  Clinical Narrative:                CSW spoke with patient. PCP is Dr. Marcello Fennel. Pharmacy is [facility] order. Patient uses [employer] transportation. Patient has a cane and grab bars. No HH or SNF history. Patient is requesting short term rehab as she lives with her Mother and sister who have health issues and are unable to assist her with getting up and down. Yesterday, PT recommended HH and OT recommended SNF. Asked PT to reassess today. Patient is aware that insurance must approve SNF, and that the other option would be private pay. She reported if insurance do not approve SNF, she would like Home Health but really feels that she needs short term rehab. Patient has had both COVID vaccines and 2 boosters. CSW will start SNF work up and will start auth pending PT reassessment.  3:35- Called [facility] and started SNF and EMS [facility]) auth with [employer].   Expected Discharge Plan: Skilled Nursing Facility Barriers to Discharge: Continued Medical Work up   Patient Goals and CMS Choice Patient states their goals for this hospitalization and ongoing recovery are:: would like to go to short term rehab CMS Medicare.gov Compare Post Acute Care list provided to:: Patient Choice offered to / list presented to : Patient  Expected Discharge Plan and Services Expected Discharge Plan: Skilled Nursing Facility       Living arrangements for the past 2 months: Single Family Home                                      Prior Living Arrangements/Services Living arrangements for the past 2 months: Single Family Home Lives with:: Parents,Siblings Patient language and need for interpreter reviewed:: Yes Do you feel safe going back to the  place where you live?: Yes      Need for Family Participation in Patient Care: Yes (Comment) Care giver support system in place?: Yes (comment) Current home services: DME Criminal Activity/Legal Involvement Pertinent to Current Situation/Hospitalization: No - Comment as needed  Activities of Daily Living Home Assistive Devices/Equipment: Cane (specify quad or straight) ADL Screening (condition at time of admission) Patient's cognitive ability adequate to safely complete daily activities?: Yes Is the patient deaf or have difficulty hearing?: No Does the patient have difficulty seeing, even when wearing glasses/contacts?: No Does the patient have difficulty concentrating, remembering, or making decisions?: No Patient able to express need for assistance with ADLs?: Yes Does the patient have difficulty dressing or bathing?: No Independently performs ADLs?: Yes (appropriate for developmental age) Does the patient have difficulty walking or climbing stairs?: Yes Weakness of Legs: Both Weakness of Arms/Hands: Both  Permission Sought/Granted Permission sought to share information with : Facility Industrial/product designer granted to share information with : Yes, Verbal Permission Granted     Permission granted to share info w AGENCY: SNF, HH, DME        Emotional Assessment       Orientation: : Oriented to Self,Oriented to Place,Oriented to  Time,Oriented to Situation Alcohol / Substance Use: Not Applicable Psych Involvement: No (comment)  Admission diagnosis:  Fall [W19.XXXA] Rhabdomyolysis [M62.82] Patient Active Problem  List   Diagnosis Date Noted  . Rhabdomyolysis 02/04/2021  . Fall 02/03/2021   PCP:  System, Provider Not In Pharmacy:  No Pharmacies Listed    Social Determinants of Health (SDOH) Interventions    Readmission Risk Interventions No flowsheet data found.

## 2021-02-05 NOTE — NC FL2 (Signed)
[place] MEDICAID FL2 LEVEL OF CARE SCREENING TOOL     IDENTIFICATION  Patient Name: Michele Brewer Birthdate: 10-23-1950 Sex: female Admission Date (Current Location): 02/03/2021  [place] and [name] Number:  Chiropodist and Address:  [facility], [address], [place], [place] [address]      Provider Number: [id number]  Attending Physician Name and Address:  Charise Killian, MD  Relative Name and Phone Number:       Current Level of Care: Hospital Recommended Level of Care: Skilled Nursing Facility Prior Approval Number:    Date Approved/Denied:   PASRR Number: [id number]  Discharge Plan:      Current Diagnoses: Patient Active Problem List   Diagnosis Date Noted  . Rhabdomyolysis 02/04/2021  . Fall 02/03/2021    Orientation RESPIRATION BLADDER Height & Weight     Self,Time,Situation,Place  Normal Continent Weight: 130 lb (59 kg) Height:  5\' 4"  (162.6 cm)  BEHAVIORAL SYMPTOMS/MOOD NEUROLOGICAL BOWEL NUTRITION STATUS      Continent Diet (heart diet, thin liquids)  AMBULATORY STATUS COMMUNICATION OF NEEDS Skin   Supervision Verbally Bruising (dry skin)                       Personal Care Assistance Level of Assistance  Bathing,Feeding,Dressing Bathing Assistance: Limited assistance Feeding assistance: Independent Dressing Assistance: Limited assistance     Functional Limitations Info             SPECIAL CARE FACTORS FREQUENCY  PT (By licensed PT),OT (By licensed OT)     PT Frequency: 5 x/week OT Frequency: 5 x/week            Contractures      Additional Factors Info  Code Status,Allergies Code Status Info: full code Allergies Info: nka           Current Medications (02/05/2021):  This is the current hospital active medication list Current Facility-Administered Medications  Medication Dose Route Frequency Provider Last Rate Last Admin  . acetaminophen (TYLENOL) tablet 650 mg  650 mg Oral  Q6H PRN Mansy, Jan A, MD   650 mg at 02/05/21 0044   Or  . acetaminophen (TYLENOL) suppository 650 mg  650 mg Rectal Q6H PRN Mansy, Jan A, MD      . carbidopa-levodopa (SINEMET IR) 25-100 MG per tablet immediate release 1 tablet  1 tablet Oral TID 02/07/21, MD   1 tablet at 02/05/21 0840  . DULoxetine (CYMBALTA) DR capsule 30 mg  30 mg Oral BID Mansy, Jan A, MD   30 mg at 02/05/21 0839  . enoxaparin (LOVENOX) injection 40 mg  40 mg Subcutaneous Q24H Mansy, Jan A, MD   40 mg at 02/04/21 2044  . gabapentin (NEURONTIN) capsule 100 mg  100 mg Oral TID Mansy, Jan A, MD   100 mg at 02/05/21 0839  . magnesium hydroxide (MILK OF MAGNESIA) suspension 30 mL  30 mL Oral Daily PRN Mansy, Jan A, MD      . morphine 2 MG/ML injection 2 mg  2 mg Intravenous Once Feb, MD      . ondansetron [facility]) tablet 4 mg  4 mg Oral Q6H PRN Mansy, Jan A, MD       Or  . ondansetron [facility]) injection 4 mg  4 mg Intravenous Q6H PRN Mansy, Jan A, MD      . traZODone (DESYREL) tablet 25 mg  25 mg Oral QHS PRN Mansy, Feb, MD  Discharge Medications: Please see discharge summary for a list of discharge medications.  Relevant Imaging Results:  Relevant Lab Results:   Additional Information SS #: [id number]  Onelia Cadmus E Kerigan Narvaez, LCSW

## 2021-02-06 DIAGNOSIS — D696 Thrombocytopenia, unspecified: Secondary | ICD-10-CM

## 2021-02-06 LAB — BASIC METABOLIC PANEL
Anion gap: 7 (ref 5–15)
BUN: 17 mg/dL (ref 8–23)
CO2: 27 mmol/L (ref 22–32)
Calcium: 9 mg/dL (ref 8.9–10.3)
Chloride: 105 mmol/L (ref 98–111)
Creatinine, Ser: 0.47 mg/dL (ref 0.44–1.00)
GFR, Estimated: >60 mL/min (ref >60)
Glucose, Bld: 82 mg/dL (ref 70–99)
Potassium: 3.6 mmol/L (ref 3.5–5.1)
Sodium: 139 mmol/L (ref 135–145)

## 2021-02-06 LAB — CBC
HCT: 32.2 % — ABNORMAL LOW (ref 36.0–46.0)
Hemoglobin: 11.4 g/dL — ABNORMAL LOW (ref 12.0–15.0)
MCH: 31.7 pg (ref 26.0–34.0)
MCHC: 35.4 g/dL (ref 30.0–36.0)
MCV: 89.4 fL (ref 80.0–100.0)
Platelets: 136 10*3/uL — ABNORMAL LOW (ref 150–400)
RBC: 3.6 MIL/uL — ABNORMAL LOW (ref 3.87–5.11)
RDW: 13.2 % (ref 11.5–15.5)
WBC: 2.8 10*3/uL — ABNORMAL LOW (ref 4.0–10.5)
nRBC: 0 % (ref 0.0–0.2)

## 2021-02-06 LAB — CK: Total CK: 240 U/L — ABNORMAL HIGH (ref 38–234)

## 2021-02-06 NOTE — TOC Progression Note (Addendum)
Transition of Care [facility]) - Progression Note    Patient Details  Name: Michele Brewer MRN: [id number] Date of Birth: [birth year]  Transition of Care [facility]) CM/SW Contact  Liliana Cline, LCSW Phone Number: 02/06/2021, 10:07 AM  Clinical Narrative:   Insurance auth pending. Reached out to [facility], [employer], [employer], and [facility] and asked them to review bed request that was sent out yesterday. Will follow for auth and bed offers.  12:20- SNF approved for 7 days from date of admission to facility. Auth # [id number]. EMS denied. Tammy from [facility] is faxing EMS denial letter. Waiting for bed offers. Will need to call Tammy when SNF is chosen. Presented bed offers to patient. She would like to review them and will let CSW know by end of day.  3:35- Call to patient to follow up. She chose [facility]. Updated [facility] Representative Tammy.  [facility] Representative Verlon Au reported [facility] will definitely have a bed on Tuesday, may possibly have a bed Monday- TOC to follow up tomorrow morning.  Expected Discharge Plan: Skilled Nursing Facility Barriers to Discharge: Continued Medical Work up  Expected Discharge Plan and Services Expected Discharge Plan: Skilled Nursing Facility       Living arrangements for the past 2 months: Single Family Home                                       Social Determinants of Health (SDOH) Interventions    Readmission Risk Interventions No flowsheet data found.

## 2021-02-06 NOTE — Plan of Care (Signed)
No complaints overnight. Vitals stable. Safety measures in place. Will continue to monitor.  Problem: Education: Goal: Knowledge of General Education information will improve Description: Including pain rating scale, medication(s)/side effects and non-pharmacologic comfort measures Outcome: Progressing   Problem: Health Behavior/Discharge Planning: Goal: Ability to manage health-related needs will improve Outcome: Progressing   Problem: Clinical Measurements: Goal: Ability to maintain clinical measurements within normal limits will improve Outcome: Progressing Goal: Will remain free from infection Outcome: Progressing Goal: Diagnostic test results will improve Outcome: Progressing Goal: Respiratory complications will improve Outcome: Progressing Goal: Cardiovascular complication will be avoided Outcome: Progressing   Problem: Activity: Goal: Risk for activity intolerance will decrease Outcome: Progressing   Problem: Nutrition: Goal: Adequate nutrition will be maintained Outcome: Progressing   Problem: Coping: Goal: Level of anxiety will decrease Outcome: Progressing   Problem: Elimination: Goal: Will not experience complications related to bowel motility Outcome: Progressing Goal: Will not experience complications related to urinary retention Outcome: Progressing   Problem: Pain Managment: Goal: General experience of comfort will improve Outcome: Progressing   Problem: Safety: Goal: Ability to remain free from injury will improve Outcome: Progressing   Problem: Skin Integrity: Goal: Risk for impaired skin integrity will decrease Outcome: Progressing

## 2021-02-06 NOTE — Progress Notes (Signed)
PROGRESS NOTE    Michele Brewer  KZL:[id number] DOB: 1951-05-07 DOA: 02/03/2021 PCP: System, Provider Not In   Assessment & Plan:   Active Problems:   Fall   Rhabdomyolysis  Rhabdomyolysis: likely secondary to fall at home. CK continues to trend down. Improving. Cr is WNL   Generalized weakness: PT/OT both now rec SNF  Left rib fractures: secondary to fall at home. Encourage incentive spirometry. Percocet prn   Parkinson's disease: continue on home dose of sinemet   Depression: severity unknown. Continue on home dose of cymbalta    Peripheral neuropathy: continue on home dose of gabapentin   Thrombocytopenia: etiology unclear, trending up today. Will continue to monitor   DVT prophylaxis: lovenox  Code Status: full  Family Communication:  Disposition Plan: likely d/c to SNF  Level of care: Med-Surg   Status is: Inpatient  Remains inpatient appropriate because:Unsafe d/c plan, IV treatments appropriate due to intensity of illness or inability to take PO and Inpatient level of care appropriate due to severity of illness, waiting on SNF placement    Dispo: The patient is from: Home              Anticipated d/c is to: SNF              Patient currently : is medically stable for d/c    Difficult to place patient : unclear         Consultants:    Procedures:   Antimicrobials:   Subjective: Pt c/o malaise   Objective: Vitals:   02/05/21 1127 02/05/21 1913 02/05/21 2341 02/06/21 0413  BP: 121/67 119/67 136/66 124/69  Pulse: (!) 59 71 (!) 58 61  Resp: 18 16 16 16   Temp: 97.9 F (36.6 C) 98.5 F (36.9 C) 98.2 F (36.8 C) 97.9 F (36.6 C)  TempSrc: Oral Oral  Oral  SpO2: 99% 100% 100% 99%  Weight:      Height:        Intake/Output Summary (Last 24 hours) at 02/06/2021 0758 Last data filed at 02/06/2021 0601 Gross per 24 hour  Intake 2969.43 ml  Output 0 ml  Net 2969.43 ml   Filed Weights   02/03/21 1439  Weight: 59 kg     Examination:  General exam: Appears calm & comfortable Respiratory system: clear breath sounds b/l. No wheezes, rales  Cardiovascular system: S1 & S2+. No gallops or clicks  Gastrointestinal system: Abd is soft, NT, ND & normal bowel sounds  Central nervous system: Alert and oriented. Moves all extremities  Psychiatry: Judgement and insight appear normal. Appropriate mood and affect     Data Reviewed: I have personally reviewed following labs and imaging studies  CBC: Recent Labs  Lab 02/03/21 1447 02/04/21 0509 02/05/21 0512 02/06/21 0523  WBC 5.2 4.2 3.3* 2.8*  HGB 13.5 11.3* 11.4* 11.4*  HCT 39.1 33.0* 33.1* 32.2*  MCV 89.1 91.7 90.9 89.4  PLT 170 145* 134* 136*   Basic Metabolic Panel: Recent Labs  Lab 02/03/21 1447 02/04/21 0509 02/05/21 0512 02/06/21 0523  NA 140 140 138 139  K 3.7 3.6 3.7 3.6  CL 103 108 108 105  CO2 26 24 26 27   GLUCOSE 95 79 73 82  BUN 22 23 24* 17  CREATININE 0.71 0.59 0.59 0.47  CALCIUM 10.0 9.1 8.9 9.0   GFR: Estimated Creatinine Clearance: 55.7 mL/min (by C-G formula based on SCr of 0.47 mg/dL). Liver Function Tests: No results for input(s): AST, ALT, ALKPHOS, BILITOT, PROT, ALBUMIN in  the last 168 hours. No results for input(s): LIPASE, AMYLASE in the last 168 hours. No results for input(s): AMMONIA in the last 168 hours. Coagulation Profile: No results for input(s): INR, PROTIME in the last 168 hours. Cardiac Enzymes: Recent Labs  Lab 02/03/21 1710 02/04/21 0509 02/05/21 0512 02/06/21 0523  CKTOTAL 304* 517* 344* 240*   BNP (last 3 results) No results for input(s): PROBNP in the last 8760 hours. HbA1C: No results for input(s): HGBA1C in the last 72 hours. CBG: No results for input(s): GLUCAP in the last 168 hours. Lipid Profile: No results for input(s): CHOL, HDL, LDLCALC, TRIG, CHOLHDL, LDLDIRECT in the last 72 hours. Thyroid Function Tests: No results for input(s): TSH, T4TOTAL, FREET4, T3FREE, THYROIDAB in  the last 72 hours. Anemia Panel: No results for input(s): VITAMINB12, FOLATE, FERRITIN, TIBC, IRON, RETICCTPCT in the last 72 hours. Sepsis Labs: No results for input(s): PROCALCITON, LATICACIDVEN in the last 168 hours.  Recent Results (from the past 240 hour(s))  Resp Panel by RT-PCR (Flu A&B, Covid) Nasopharyngeal Swab     Status: None   Collection Time: 02/03/21 10:31 PM   Specimen: Nasopharyngeal Swab; Nasopharyngeal(NP) swabs in vial transport medium  Result Value Ref Range Status   SARS Coronavirus 2 by RT PCR NEGATIVE NEGATIVE Final    Comment: (NOTE) SARS-CoV-2 target nucleic acids are NOT DETECTED.  The SARS-CoV-2 RNA is generally detectable in upper respiratory specimens during the acute phase of infection. The lowest concentration of SARS-CoV-2 viral copies this assay can detect is 138 copies/mL. A negative result does not preclude SARS-Cov-2 infection and should not be used as the sole basis for treatment or other patient management decisions. A negative result may occur with  improper specimen collection/handling, submission of specimen other than nasopharyngeal swab, presence of viral mutation(s) within the areas targeted by this assay, and inadequate number of viral copies(<138 copies/mL). A negative result must be combined with clinical observations, patient history, and epidemiological information. The expected result is Negative.  Fact Sheet for Patients:  BloggerCourse.com  Fact Sheet for Healthcare Providers:  SeriousBroker.it  This test is no t yet approved or cleared by the [place] FDA and  has been authorized for detection and/or diagnosis of SARS-CoV-2 by FDA under an Emergency Use Authorization (EUA). This EUA will remain  in effect (meaning this test can be used) for the duration of the COVID-19 declaration under Section 564(b)(1) of the Act, 21 U.S.C.section 360bbb-3(b)(1), unless the  authorization is terminated  or revoked sooner.       Influenza A by PCR NEGATIVE NEGATIVE Final   Influenza B by PCR NEGATIVE NEGATIVE Final    Comment: (NOTE) The Xpert Xpress SARS-CoV-2/FLU/RSV plus assay is intended as an aid in the diagnosis of influenza from Nasopharyngeal swab specimens and should not be used as a sole basis for treatment. Nasal washings and aspirates are unacceptable for Xpert Xpress SARS-CoV-2/FLU/RSV testing.  Fact Sheet for Patients: BloggerCourse.com  Fact Sheet for Healthcare Providers: SeriousBroker.it  This test is not yet approved or cleared by the [place] FDA and has been authorized for detection and/or diagnosis of SARS-CoV-2 by FDA under an Emergency Use Authorization (EUA). This EUA will remain in effect (meaning this test can be used) for the duration of the COVID-19 declaration under Section 564(b)(1) of the Act, 21 U.S.C. section 360bbb-3(b)(1), unless the authorization is terminated or revoked.  Performed at [facility], [address], [place], [place] [address]  Radiology Studies: No results found.      Scheduled Meds: . carbidopa-levodopa  1 tablet Oral TID  . DULoxetine  30 mg Oral BID  . enoxaparin (LOVENOX) injection  40 mg Subcutaneous Q24H  . gabapentin  100 mg Oral TID  .  morphine injection  2 mg Intravenous Once   Continuous Infusions:    LOS: 2 days    Time spent: 30  mins     Charise Killian, MD [facility] Pager [phone]  If 7PM-7AM, please contact night-coverage 02/06/2021, 7:58 AM

## 2021-02-06 NOTE — Plan of Care (Signed)
Continuing with plan of care. 

## 2021-02-07 LAB — BASIC METABOLIC PANEL
Anion gap: 6 (ref 5–15)
BUN: 18 mg/dL (ref 8–23)
CO2: 29 mmol/L (ref 22–32)
Calcium: 9.1 mg/dL (ref 8.9–10.3)
Chloride: 104 mmol/L (ref 98–111)
Creatinine, Ser: 0.62 mg/dL (ref 0.44–1.00)
GFR, Estimated: >60 mL/min (ref >60)
Glucose, Bld: 81 mg/dL (ref 70–99)
Potassium: 3.8 mmol/L (ref 3.5–5.1)
Sodium: 139 mmol/L (ref 135–145)

## 2021-02-07 LAB — CBC
HCT: 31.8 % — ABNORMAL LOW (ref 36.0–46.0)
Hemoglobin: 11.2 g/dL — ABNORMAL LOW (ref 12.0–15.0)
MCH: 31.5 pg (ref 26.0–34.0)
MCHC: 35.2 g/dL (ref 30.0–36.0)
MCV: 89.6 fL (ref 80.0–100.0)
Platelets: 149 10*3/uL — ABNORMAL LOW (ref 150–400)
RBC: 3.55 MIL/uL — ABNORMAL LOW (ref 3.87–5.11)
RDW: 13.2 % (ref 11.5–15.5)
WBC: 3.4 10*3/uL — ABNORMAL LOW (ref 4.0–10.5)
nRBC: 0 % (ref 0.0–0.2)

## 2021-02-07 LAB — SARS CORONAVIRUS 2 (TAT 6-24 HRS): SARS Coronavirus 2: NEGATIVE

## 2021-02-07 NOTE — TOC Progression Note (Signed)
Transition of Care [facility]) - Progression Note    Patient Details  Name: Michele Brewer MRN: [id number] Date of Birth: Mar 04, 1951  Transition of Care [facility]) CM/SW Contact  Chapman Fitch, RN Phone Number: 02/07/2021, 3:28 PM  Clinical Narrative:     Confirmed with Verlon Au at [facility] that they will have a bed tomorrow Patient updated MD has ordered repeat covid test   Expected Discharge Plan: Skilled Nursing Facility Barriers to Discharge: Continued Medical Work up  Expected Discharge Plan and Services Expected Discharge Plan: Skilled Nursing Facility       Living arrangements for the past 2 months: Single Family Home                                       Social Determinants of Health (SDOH) Interventions    Readmission Risk Interventions No flowsheet data found.

## 2021-02-07 NOTE — Progress Notes (Signed)
PROGRESS NOTE    Michele Brewer  OZD:[id number] DOB: 1951-08-04 DOA: 02/03/2021 PCP: System, Provider Not In   Assessment & Plan:   Active Problems:   Fall   Rhabdomyolysis  Rhabdomyolysis: likely secondary to fall at home. Cr is WNL. Continues to improve  Generalized weakness: therapy recs SNF. Will d/c to SNF tomorrow, when bed is available  Left rib fractures: secondary to fall at home. Percocet prn. Encourage incentive spirometry   Parkinson's disease: continue on home dose of sinemet   Depression: severity unknown. Continue on home dose of cymbalta  Peripheral neuropathy: continue on home dose of gabapentin   Thrombocytopenia: etiology unclear, continues to trend up. Will continue to monitor   DVT prophylaxis: lovenox  Code Status: full  Family Communication:  Disposition Plan: likely d/c to SNF  Level of care: Med-Surg   Status is: Inpatient  Remains inpatient appropriate because:Unsafe d/c plan, IV treatments appropriate due to intensity of illness or inability to take PO and Inpatient level of care appropriate due to severity of illness, will go to SNF tomorrow when the bed is available    Dispo: The patient is from: Home              Anticipated d/c is to: SNF              Patient currently : is medically stable for d/c    Difficult to place patient : unclear         Consultants:    Procedures:   Antimicrobials:   Subjective: Pt c/o fatigue   Objective: Vitals:   02/06/21 1559 02/06/21 1911 02/06/21 2347 02/07/21 0423  BP: 115/68 96/72 (!) 123/56 128/74  Pulse: 74 75 62 60  Resp: 18 16 17 16   Temp: 98.1 F (36.7 C) 98.7 F (37.1 C) 98 F (36.7 C) 98.7 F (37.1 C)  TempSrc: Oral Oral Oral Oral  SpO2: 100% 100% 100% 99%  Weight:      Height:       No intake or output data in the 24 hours ending 02/07/21 0747 Filed Weights   02/03/21 1439  Weight: 59 kg    Examination:  General exam: Appears comfortable  Respiratory system:  clear breath sounds b/l  Cardiovascular system: S1/S2+. No rubs or clicks  Gastrointestinal system: Abd is soft, NT, ND & normal bowel sounds  Central nervous system: Alert and oriented. Moves all extremities  Psychiatry: judgement and insight appear normal. Appropriate mood and affect     Data Reviewed: I have personally reviewed following labs and imaging studies  CBC: Recent Labs  Lab 02/03/21 1447 02/04/21 0509 02/05/21 0512 02/06/21 0523 02/07/21 0422  WBC 5.2 4.2 3.3* 2.8* 3.4*  HGB 13.5 11.3* 11.4* 11.4* 11.2*  HCT 39.1 33.0* 33.1* 32.2* 31.8*  MCV 89.1 91.7 90.9 89.4 89.6  PLT 170 145* 134* 136* 149*   Basic Metabolic Panel: Recent Labs  Lab 02/03/21 1447 02/04/21 0509 02/05/21 0512 02/06/21 0523 02/07/21 0422  NA 140 140 138 139 139  K 3.7 3.6 3.7 3.6 3.8  CL 103 108 108 105 104  CO2 26 24 26 27 29   GLUCOSE 95 79 73 82 81  BUN 22 23 24* 17 18  CREATININE 0.71 0.59 0.59 0.47 0.62  CALCIUM 10.0 9.1 8.9 9.0 9.1   GFR: Estimated Creatinine Clearance: 55.7 mL/min (by C-G formula based on SCr of 0.62 mg/dL). Liver Function Tests: No results for input(s): AST, ALT, ALKPHOS, BILITOT, PROT, ALBUMIN in the last 168  hours. No results for input(s): LIPASE, AMYLASE in the last 168 hours. No results for input(s): AMMONIA in the last 168 hours. Coagulation Profile: No results for input(s): INR, PROTIME in the last 168 hours. Cardiac Enzymes: Recent Labs  Lab 02/03/21 1710 02/04/21 0509 02/05/21 0512 02/06/21 0523  CKTOTAL 304* 517* 344* 240*   BNP (last 3 results) No results for input(s): PROBNP in the last 8760 hours. HbA1C: No results for input(s): HGBA1C in the last 72 hours. CBG: No results for input(s): GLUCAP in the last 168 hours. Lipid Profile: No results for input(s): CHOL, HDL, LDLCALC, TRIG, CHOLHDL, LDLDIRECT in the last 72 hours. Thyroid Function Tests: No results for input(s): TSH, T4TOTAL, FREET4, T3FREE, THYROIDAB in the last 72  hours. Anemia Panel: No results for input(s): VITAMINB12, FOLATE, FERRITIN, TIBC, IRON, RETICCTPCT in the last 72 hours. Sepsis Labs: No results for input(s): PROCALCITON, LATICACIDVEN in the last 168 hours.  Recent Results (from the past 240 hour(s))  Resp Panel by RT-PCR (Flu A&B, Covid) Nasopharyngeal Swab     Status: None   Collection Time: 02/03/21 10:31 PM   Specimen: Nasopharyngeal Swab; Nasopharyngeal(NP) swabs in vial transport medium  Result Value Ref Range Status   SARS Coronavirus 2 by RT PCR NEGATIVE NEGATIVE Final    Comment: (NOTE) SARS-CoV-2 target nucleic acids are NOT DETECTED.  The SARS-CoV-2 RNA is generally detectable in upper respiratory specimens during the acute phase of infection. The lowest concentration of SARS-CoV-2 viral copies this assay can detect is 138 copies/mL. A negative result does not preclude SARS-Cov-2 infection and should not be used as the sole basis for treatment or other patient management decisions. A negative result may occur with  improper specimen collection/handling, submission of specimen other than nasopharyngeal swab, presence of viral mutation(s) within the areas targeted by this assay, and inadequate number of viral copies(<138 copies/mL). A negative result must be combined with clinical observations, patient history, and epidemiological information. The expected result is Negative.  Fact Sheet for Patients:  BloggerCourse.com  Fact Sheet for Healthcare Providers:  SeriousBroker.it  This test is no t yet approved or cleared by the [place] FDA and  has been authorized for detection and/or diagnosis of SARS-CoV-2 by FDA under an Emergency Use Authorization (EUA). This EUA will remain  in effect (meaning this test can be used) for the duration of the COVID-19 declaration under Section 564(b)(1) of the Act, 21 U.S.C.section 360bbb-3(b)(1), unless the authorization is  terminated  or revoked sooner.       Influenza A by PCR NEGATIVE NEGATIVE Final   Influenza B by PCR NEGATIVE NEGATIVE Final    Comment: (NOTE) The Xpert Xpress SARS-CoV-2/FLU/RSV plus assay is intended as an aid in the diagnosis of influenza from Nasopharyngeal swab specimens and should not be used as a sole basis for treatment. Nasal washings and aspirates are unacceptable for Xpert Xpress SARS-CoV-2/FLU/RSV testing.  Fact Sheet for Patients: BloggerCourse.com  Fact Sheet for Healthcare Providers: SeriousBroker.it  This test is not yet approved or cleared by the [place] FDA and has been authorized for detection and/or diagnosis of SARS-CoV-2 by FDA under an Emergency Use Authorization (EUA). This EUA will remain in effect (meaning this test can be used) for the duration of the COVID-19 declaration under Section 564(b)(1) of the Act, 21 U.S.C. section 360bbb-3(b)(1), unless the authorization is terminated or revoked.  Performed at [facility], [address] [place], [place] [address]          Radiology Studies:  No results found.      Scheduled Meds: . carbidopa-levodopa  1 tablet Oral TID  . DULoxetine  30 mg Oral BID  . enoxaparin (LOVENOX) injection  40 mg Subcutaneous Q24H  . gabapentin  100 mg Oral TID  .  morphine injection  2 mg Intravenous Once   Continuous Infusions:    LOS: 3 days    Time spent: 25 mins     Charise Killian, MD [facility] Pager [phone]  If 7PM-7AM, please contact night-coverage 02/07/2021, 7:47 AM

## 2021-02-07 NOTE — Care Management Important Message (Signed)
Important Message  Patient Details  Name: Michele Brewer MRN: [id number] Date of Birth: December 12, 1950   Medicare Important Message Given:  Yes     [name] 02/07/2021, 12:46 PM

## 2021-02-07 NOTE — Progress Notes (Signed)
Physical Therapy Treatment Patient Details Name: Michele Brewer MRN: [id number] DOB: 05/16/1951 Today's Date: 02/07/2021    History of Present Illness Michele Brewer is a 70 y.o. female here with generalized weakness.  The patient states that for the last 2 days, she has had generalized weakness and difficulty with ambulating.  The patient reportedly fell out of her bed on Sunday.  She had some moderate left sided chest pain after the fall.  Since then, she has had persistent and worsening weakness and feels like she has been leaning to her left" favoring" her left side. Chest x-ray showed minimally displaced left seventh rib fracture with no acute cardiopulmonary disease.    PT Comments    Patient agreeable to PT and sitting up in chair on arrival to room. Patient continues to require physical assistance with mobility and is limited at times by rib pain and fatigue with activity. Continue to recommend SNF placement at discharge for ongoing PT efforts to maximize independence and decrease caregiver burden. PT will continue to follow.     Follow Up Recommendations  SNF     Equipment Recommendations  Rolling walker with 5" wheels;3in1 (PT)    Recommendations for Other Services       Precautions / Restrictions Precautions Precautions: Fall Restrictions Weight Bearing Restrictions: No    Mobility  Bed Mobility               General bed mobility comments: not observed as patient sittng up on arrival to room and post session    Transfers Overall transfer level: Needs assistance Equipment used: Rolling walker (2 wheeled) Transfers: Sit to/from Stand Sit to Stand: Min assist         General transfer comment: verbal cues for hand placement and eccentric control. lifting assistance provided with standing  Ambulation/Gait Ambulation/Gait assistance: Min guard;Min assist Gait Distance (Feet): 175 Feet Assistive device: Rolling walker (2 wheeled);None Gait Pattern/deviations:  Step-through pattern;Narrow base of support Gait velocity: decreased   General Gait Details: CGA initially progressing to Min A with increased ambulation distance. patient reports feeling mild lightheadedness with walking. Sp02 94% on room air. Cues for pacing for endurance with slow overall cadence   Stairs             Wheelchair Mobility    Modified Rankin (Stroke Patients Only)       Balance                                            Cognition Arousal/Alertness: Awake/alert Behavior During Therapy: WFL for tasks assessed/performed Overall Cognitive Status: Within Functional Limits for tasks assessed                                 General Comments: Pt has hx of Parkinson's and has tremors at rest.      Exercises      General Comments        Pertinent Vitals/Pain Pain Assessment: 0-10 Pain Score: 6  Pain Location: left rib cage Pain Descriptors / Indicators: Discomfort Pain Intervention(s): Limited activity within patient's tolerance    Home Living                      Prior Function            PT Goals (current  goals can now be found in the care plan section) Acute Rehab PT Goals Patient Stated Goal: to regain independence PT Goal Formulation: With patient Time For Goal Achievement: 02/18/21 Potential to Achieve Goals: Fair Progress towards PT goals: Progressing toward goals    Frequency    Min 2X/week      PT Plan Current plan remains appropriate    Co-evaluation              AM-PAC PT "6 Clicks" Mobility   Outcome Measure  Help needed turning from your back to your side while in a flat bed without using bedrails?: A Lot Help needed moving from lying on your back to sitting on the side of a flat bed without using bedrails?: A Lot Help needed moving to and from a bed to a chair (including a wheelchair)?: A Lot Help needed standing up from a chair using your arms (e.g., wheelchair or  bedside chair)?: A Little Help needed to walk in hospital room?: A Little Help needed climbing 3-5 steps with a railing? : A Lot 6 Click Score: 14    End of Session   Activity Tolerance: Patient tolerated treatment well Patient left: in chair;with call bell/phone within reach   PT Visit Diagnosis: Unsteadiness on feet (R26.81);Other abnormalities of gait and mobility (R26.89);Repeated falls (R29.6);Muscle weakness (generalized) (M62.81);History of falling (Z91.81);Difficulty in walking, not elsewhere classified (R26.2)     Time: 6606-3016 PT Time Calculation (min) (ACUTE ONLY): 28 min  Charges:  $Therapeutic Activity: 23-37 mins                     Donna Bernard, PT, MPT   Ina Homes 02/07/2021, 3:20 PM

## 2021-02-08 DIAGNOSIS — R296 Repeated falls: Secondary | ICD-10-CM | POA: Diagnosis not present

## 2021-02-08 DIAGNOSIS — G609 Hereditary and idiopathic neuropathy, unspecified: Secondary | ICD-10-CM

## 2021-02-08 DIAGNOSIS — D696 Thrombocytopenia, unspecified: Secondary | ICD-10-CM | POA: Diagnosis not present

## 2021-02-08 DIAGNOSIS — F331 Major depressive disorder, recurrent, moderate: Secondary | ICD-10-CM | POA: Diagnosis not present

## 2021-02-08 DIAGNOSIS — E039 Hypothyroidism, unspecified: Secondary | ICD-10-CM | POA: Diagnosis not present

## 2021-02-08 DIAGNOSIS — S2249XA Multiple fractures of ribs, unspecified side, initial encounter for closed fracture: Secondary | ICD-10-CM | POA: Diagnosis not present

## 2021-02-08 DIAGNOSIS — E538 Deficiency of other specified B group vitamins: Secondary | ICD-10-CM | POA: Diagnosis not present

## 2021-02-08 DIAGNOSIS — G629 Polyneuropathy, unspecified: Secondary | ICD-10-CM | POA: Insufficient documentation

## 2021-02-08 DIAGNOSIS — E559 Vitamin D deficiency, unspecified: Secondary | ICD-10-CM | POA: Diagnosis not present

## 2021-02-08 DIAGNOSIS — T796XXA Traumatic ischemia of muscle, initial encounter: Secondary | ICD-10-CM | POA: Diagnosis not present

## 2021-02-08 DIAGNOSIS — F3340 Major depressive disorder, recurrent, in remission, unspecified: Secondary | ICD-10-CM | POA: Diagnosis not present

## 2021-02-08 DIAGNOSIS — W19XXXD Unspecified fall, subsequent encounter: Secondary | ICD-10-CM | POA: Diagnosis not present

## 2021-02-08 DIAGNOSIS — G20 Parkinson's disease: Secondary | ICD-10-CM | POA: Diagnosis not present

## 2021-02-08 DIAGNOSIS — M8580 Other specified disorders of bone density and structure, unspecified site: Secondary | ICD-10-CM | POA: Diagnosis not present

## 2021-02-08 DIAGNOSIS — S2242XD Multiple fractures of ribs, left side, subsequent encounter for fracture with routine healing: Secondary | ICD-10-CM | POA: Diagnosis not present

## 2021-02-08 DIAGNOSIS — R531 Weakness: Secondary | ICD-10-CM | POA: Diagnosis not present

## 2021-02-08 DIAGNOSIS — R42 Dizziness and giddiness: Secondary | ICD-10-CM | POA: Diagnosis not present

## 2021-02-08 DIAGNOSIS — M6282 Rhabdomyolysis: Secondary | ICD-10-CM | POA: Diagnosis not present

## 2021-02-08 LAB — BASIC METABOLIC PANEL
Anion gap: 6 (ref 5–15)
BUN: 22 mg/dL (ref 8–23)
CO2: 29 mmol/L (ref 22–32)
Calcium: 9.2 mg/dL (ref 8.9–10.3)
Chloride: 104 mmol/L (ref 98–111)
Creatinine, Ser: 0.5 mg/dL (ref 0.44–1.00)
GFR, Estimated: >60 mL/min (ref >60)
Glucose, Bld: 85 mg/dL (ref 70–99)
Potassium: 3.9 mmol/L (ref 3.5–5.1)
Sodium: 139 mmol/L (ref 135–145)

## 2021-02-08 LAB — CBC
HCT: 34.8 % — ABNORMAL LOW (ref 36.0–46.0)
Hemoglobin: 12 g/dL (ref 12.0–15.0)
MCH: 31.3 pg (ref 26.0–34.0)
MCHC: 34.5 g/dL (ref 30.0–36.0)
MCV: 90.6 fL (ref 80.0–100.0)
Platelets: 162 10*3/uL (ref 150–400)
RBC: 3.84 MIL/uL — ABNORMAL LOW (ref 3.87–5.11)
RDW: 13.2 % (ref 11.5–15.5)
WBC: 3.3 10*3/uL — ABNORMAL LOW (ref 4.0–10.5)
nRBC: 0 % (ref 0.0–0.2)

## 2021-02-08 MED ORDER — OXYCODONE-ACETAMINOPHEN 5-325 MG PO TABS: 1.0000 | ORAL_TABLET | Freq: Four times a day (QID) | ORAL | 0 refills | Status: AC | PRN

## 2021-02-08 NOTE — Progress Notes (Signed)
Occupational Therapy Treatment Patient Details Name: Michele Brewer MRN: [id number] DOB: 07/17/1951 Today's Date: 02/08/2021    History of present illness Michele Brewer is a 70 y.o. female here with generalized weakness.  The patient states that for the last 2 days, she has had generalized weakness and difficulty with ambulating.  The patient reportedly fell out of her bed on Sunday.  She had some moderate left sided chest pain after the fall.  Since then, she has had persistent and worsening weakness and feels like she has been leaning to her left" favoring" her left side. Chest x-ray showed minimally displaced left seventh rib fracture with no acute cardiopulmonary disease.   OT comments  Pt seen for OT treatment on this date. Upon arrival to room, pt awake and seated upright in recliner. Pt agreeable to tx. Pt attempted to don socks seated via figure-4 position however unable d/t rib pain and ultimately requiring MAX A from this author. With MIN A for steadying, pt able to don/doff posterior hospital gown while standing and walk 11ft with RW. Pt is making good progress toward goals and continues to benefit from skilled OT services to maximize return to PLOF and minimize risk of future falls, injury, caregiver burden, and readmission. Will continue to follow POC. Discharge recommendation remains appropriate.    Follow Up Recommendations  SNF    Equipment Recommendations  None recommended by OT       Precautions / Restrictions Precautions Precautions: Fall Restrictions Weight Bearing Restrictions: No       Mobility Bed Mobility               General bed mobility comments: not observed as patient sittng up on arrival to room and post session    Transfers Overall transfer level: Needs assistance Equipment used: Rolling walker (2 wheeled) Transfers: Sit to/from Stand Sit to Stand: Min assist         General transfer comment: MIN A for steadying upon standing    Balance  Overall balance assessment: Needs assistance Sitting-balance support: No upper extremity supported;Feet supported Sitting balance-Leahy Scale: Good Sitting balance - Comments: good sitting balance reaching within BOS   Standing balance support: Bilateral upper extremity supported;During functional activity Standing balance-Leahy Scale: Poor Standing balance comment: MIN A and intermittent b/l UE support from RW during standing UB dressing                           ADL either performed or assessed with clinical judgement   ADL Overall ADL's : Needs assistance/impaired                 Upper Body Dressing : Minimal assistance;Standing Upper Body Dressing Details (indicate cue type and reason): to don/doff posterior gown. MIN A for steadying Lower Body Dressing: Maximal assistance;Sitting/lateral leans Lower Body Dressing Details (indicate cue type and reason): to don socks d/t rib pain             Functional mobility during ADLs: Minimal assistance;Rolling walker (to walk 113ft with RW)                 Cognition Arousal/Alertness: Awake/alert Behavior During Therapy: WFL for tasks assessed/performed Overall Cognitive Status: Within Functional Limits for tasks assessed  Pertinent Vitals/ Pain       Pain Assessment: Faces Faces Pain Scale: Hurts little more Pain Location: left rib cage Pain Descriptors / Indicators: Discomfort Pain Intervention(s): Limited activity within patient's tolerance;Monitored during session;Repositioned     Prior Functioning/Environment              Frequency  Min 1X/week        Progress Toward Goals  OT Goals(current goals can now be found in the care plan section)  Progress towards OT goals: Progressing toward goals  Acute Rehab OT Goals Patient Stated Goal: to regain independence OT Goal Formulation: With patient Time For Goal  Achievement: 02/18/21 Potential to Achieve Goals: Good  Plan Discharge plan remains appropriate;Frequency remains appropriate       AM-PAC OT "6 Clicks" Daily Activity     Outcome Measure   Help from another person eating meals?: None Help from another person taking care of personal grooming?: A Little Help from another person toileting, which includes using toliet, bedpan, or urinal?: A Little Help from another person bathing (including washing, rinsing, drying)?: A Little Help from another person to put on and taking off regular upper body clothing?: A Little Help from another person to put on and taking off regular lower body clothing?: A Little 6 Click Score: 19    End of Session Equipment Utilized During Treatment: Rolling walker;Gait belt  OT Visit Diagnosis: Other abnormalities of gait and mobility (R26.89);Muscle weakness (generalized) (M62.81)   Activity Tolerance Patient tolerated treatment well   Patient Left in bed;with call bell/phone within reach;with nursing/sitter in room   Nurse Communication Mobility status        Time: 9449-6759 OT Time Calculation (min): 23 min  Charges: OT Treatments $Self Care/Home Management : 8-22 mins $Therapeutic Activity: 8-22 mins  Matthew Folks, OTR/L [facility] [phone]

## 2021-02-08 NOTE — TOC Transition Note (Signed)
Transition of Care [facility]) - CM/SW Discharge Note   Patient Details  Name: Michele Brewer MRN: [id number] Date of Birth: 06-Aug-1951  [facility] CM/SW Contact:  Chapman Fitch, RN Phone Number: 02/08/2021, 2:17 PM   Clinical Narrative:    Patient to discharge today to [facility] Patient states that she has already notified her family about the discharge  DC info sent in [facility] Transport set up for 2 pm  [place] waiver sent to [facility] transport    Final next level of care: Skilled Nursing Facility Barriers to Discharge: No Barriers Identified   Patient Goals and CMS Choice Patient states their goals for this hospitalization and ongoing recovery are:: would like to go to short term rehab CMS Medicare.gov Compare Post Acute Care list provided to:: Patient Choice offered to / list presented to : Patient  Discharge Placement                       Discharge Plan and Services                                     Social Determinants of Health (SDOH) Interventions     Readmission Risk Interventions No flowsheet data found.

## 2021-02-08 NOTE — Plan of Care (Signed)

## 2021-02-08 NOTE — Discharge Summary (Signed)
Physician Discharge Summary  Michele Brewer UJW:[id number] DOB: Aug 21, 1951 DOA: 02/03/2021  PCP: System, Provider Not In  Admit date: 02/03/2021 Discharge date: 02/08/2021  Admitted From: home Disposition: SNF  Recommendations for Outpatient Follow-up:  1. Follow up with PCP in 1-2 weeks 2. F/u w/ neuro, Dr. Steele Sizer, in 2-3 weeks  Home Health: no  Equipment/Devices  Discharge Condition: stable  CODE STATUS: full  Diet recommendation: regular  Brief/Interim Summary: HPI was taken from Dr. Arville Care: Michele Brewer is a 70 y.o. female with medical history significant for Parkinson's disease, osteopenia and major depressive disorder, who presented to the ER with acute onset of generalized weakness with difficulty with ambulation.  The patient apparently had a fall out of his bed on Sunday.  She had subsequent left-sided chest pain after a fall and since then has been progressively weaker and favoring her left side.  Over the last couple of days she was unable to ambulate.  She has been in a chair for the last 12 hours.  She subsequently was unable to eat or drink during that time.  No other paresthesias or focal muscle weakness.  She denies any chest pain or dyspnea or palpitations.  No fever or chills.  No nausea or vomiting or abdominal pain.  No dysuria, oliguria or hematuria or flank pain.  She has not taken her Sinemet or any of her medications during that time either.  ED Course: Upon presentation to the ER vital signs were within normal.  Labs revealed unremarkable BMP except for a BUN of 22 and CBC.  Her CK was 304 and high-sensitivity troponin was 5.  UA was unremarkable 413 and 20 ketones.  EKG as reviewed by me : Showed normal sinus rhythm with a rate of 95 with short PR interval and occasional premature ventricular complexes. Imaging: Chest x-ray showed minimally displaced left seventh rib fracture with no acute cardiopulmonary disease. Noncontrasted head CT scan revealed no acute  intracranial normalities.  It showed background parenchymal volume loss and microvascular angiopathy.  C-spine CT showed no fracture or traumatic listhesis.  It showed cervical spondylitic changes.  Chest CTA revealed multiple left rib fractures 7th-9th without complicating factors and a small left upper lobe nodule with recommendation for noncontrast chest CT in 12 months if patient is high risk.  It showed aortic atherosclerosis without other abnormalities.  The patient was given 1 g of p.o. Tylenol, 1 p.o. sentiment, 2 mg of IV morphine sulfate and 1 L bolus of IV normal saline.  She will be admitted to an observation medical bed for further valuation and management.   Hospital course from Dr. Mayford Knife 5/20-5/24/22: Pt presented after a fall at home and found to have rhabdomyolysis and generalized weakness. Pt did receive IVFs while inpatient. Of note, pt was also found to have left rib fractures secondary to fall and this was treated w/ conservative management. PT/OT evaluated the pt and recommended SNF. For more information, please see previous progress/consult notes.   Discharge Diagnoses:  Active Problems:   Fall   Rhabdomyolysis Rhabdomyolysis: likely secondary to fall at home. Cr is WNL.Resolved  Generalized weakness: PT/OT recs SNF  Left rib fractures: secondary to fall at home. Percocet prn. Encourage incentive spirometry   Parkinson's disease: continue on home dose of sinemet   Depression: severity unknown. Continue on home dose of cymbalta  Peripheral neuropathy: continue on home dose of gabapentin    Thrombocytopenia: resolved    Discharge Instructions  Discharge Instructions    Diet general  Complete by: As directed    Discharge instructions   Complete by: As directed    F/ u w/ PCP in 1-2 weeks. F/u w/ neuro, Dr. Cristopher Peru, in 2-3 weeks. (Needs to establish care, would be new pt for this neurologist)   Increase activity slowly   Complete by: As directed       Allergies as of 02/08/2021   No Known Allergies     Medication List    TAKE these medications   carbidopa-levodopa 25-100 MG tablet Commonly known as: SINEMET IR Take 1 tablet by mouth in the morning, at noon, and at bedtime.   cyanocobalamin 1000 MCG/ML injection Commonly known as: (VITAMIN B-12) Inject into the muscle.   DULoxetine 30 MG capsule Commonly known as: CYMBALTA Take 1 capsule by mouth in the morning and at bedtime.   ergocalciferol 1.25 MG (50000 UT) capsule Commonly known as: VITAMIN D2 Take 1 capsule by mouth once a week.   gabapentin 100 MG capsule Commonly known as: NEURONTIN Take 100 mg by mouth 3 (three) times daily.   multivitamin capsule Take 1 capsule by mouth. Take 1 capsule by mouth every other day   olive oil external oil Take 1 capsule by mouth daily.   oxyCODONE-acetaminophen 5-325 MG tablet Commonly known as: PERCOCET/ROXICET Take 1 tablet by mouth every 6 (six) hours as needed for up to 1 day for moderate pain.       Follow-up Information    Lonell Face, MD Follow up in 2 week(s).   Specialty: Neurology Contact information: [address] [facility] [place] [address] [phone]              No Known Allergies  Consultations:     Procedures/Studies: CT Head Wo Contrast  Result Date: 02/03/2021 CLINICAL DATA:  Rolled from bed on Sunday, unable to get up EXAM: CT HEAD WITHOUT CONTRAST CT CERVICAL SPINE WITHOUT CONTRAST TECHNIQUE: Multidetector CT imaging of the head and cervical spine was performed following the standard protocol without intravenous contrast. Multiplanar CT image reconstructions of the cervical spine were also generated. COMPARISON:  None. FINDINGS: CT HEAD FINDINGS Brain: No evidence of acute infarction, hemorrhage, hydrocephalus, extra-axial collection, visible mass lesion or mass effect. Symmetric prominence of the ventricles, cisterns and sulci compatible with  parenchymal volume loss with a slight frontal lobe predominance. Patchy areas of white matter hypoattenuation are most compatible with chronic microvascular angiopathy. Scattered dural calcifications. Insert tonsils normally position. Benign intracranial structures are unremarkable. Vascular: Atherosclerotic calcification of the carotid siphons. No hyperdense vessel. Skull: No significant scalp swelling, hematoma or calvarial fracture. Sinuses/Orbits: Paranasal sinuses and mastoid air cells are predominantly clear. Included orbital structures are unremarkable. Other: None CT CERVICAL SPINE FINDINGS Alignment: Stabilization collar absent at the time of exam. Mild cervical flexion noted scout view. Right lateral bending noted on coronal view. There is slight leftward cranial rotation as well. Reversal the normal cervical lordosis likely related to cervical flexion and positioning. No evidence of traumatic listhesis. No abnormally widened, perched or jumped facets. Normal alignment of the craniocervical and atlantoaxial articulations. Skull base and vertebrae: No acute skull base fracture. No vertebral body fracture or height loss. Normal bone mineralization. Benign bone island C5. No worrisome osseous lesions. Soft tissues and spinal canal: No pre or paravertebral fluid or swelling. No visible canal hematoma. Disc levels: Multilevel intervertebral disc height loss with spondylitic endplate changes. Slightly larger disc osteophyte complex noted C4-5 with partial effacement of the ventral thecal sac  but no significant spinal canal. Uncinate spurring and facet hypertrophic changes are noted throughout the cervical levels, resulting in some moderate foraminal narrowing on right C4-5 and bilaterally C6-7. Upper chest: No acute abnormality in the upper chest or imaged lung apices. Other: No concerning thyroid nodules or masses. Punctate focus of intravenous gas in the left brachiocephalic vein likely related to intravenous  access. IMPRESSION: 1. No acute intracranial abnormality. Calvarial fracture or significant swelling. 2. 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 No acute fracture or traumatic listhesis of the cervical spine. 4. Cervical spondylitic changes as described above. Electronically Signed   By: Kreg Shropshire M.D.   On: 02/03/2021 17:53   CT Chest Wo Contrast  Result Date: 02/03/2021 CLINICAL DATA:  Recent fall with left rib fracture on recent plain film, initial encounter EXAM: CT CHEST WITHOUT CONTRAST TECHNIQUE: Multidetector CT imaging of the chest was performed following the standard protocol without IV contrast. COMPARISON:  Chest x-ray from earlier in the same day. FINDINGS: Cardiovascular: Somewhat limited due to lack of IV contrast. Aortic calcifications are noted. Coronary calcifications are seen. No cardiac enlargement is noted. Mediastinum/Nodes: Thoracic inlet is within normal limits. No sizable hilar or mediastinal adenopathy is noted. The esophagus as visualized is within normal limits. Lungs/Pleura: Lungs are well aerated bilaterally. Mild emphysematous changes are seen. No pneumothorax is seen. No effusion is noted. A 4-5 mm nodule is noted in the anterior aspect of the left upper lobe best seen on image number 55 of series 3. No other sizable nodules are seen. Upper Abdomen: Visualized upper abdomen shows no acute abnormality. Musculoskeletal: Degenerative changes of the thoracic spine are noted. Multiple left rib fractures are seen to include the seventh, eighth and ninth ribs laterally. These correspond with that seen on prior plain film examination. Again no pneumothorax is seen. IMPRESSION: Multiple left rib fractures without complicating factors. Small left upper lobe nodule as described. No follow-up needed if patient is low-risk. Non-contrast chest CT can be considered in 12 months if patient is high-risk. This recommendation follows the consensus statement:  Guidelines for Management of Incidental Pulmonary Nodules Detected on CT Images: From the Fleischner Society 2017; Radiology 2017; 284:228-243. No other focal abnormality is seen. Aortic Atherosclerosis (ICD10-I70.0). Electronically Signed   By: Alcide Clever M.D.   On: 02/03/2021 19:18   CT Cervical Spine Wo Contrast  Result Date: 02/03/2021 CLINICAL DATA:  Rolled from bed on Sunday, unable to get up EXAM: CT HEAD WITHOUT CONTRAST CT CERVICAL SPINE WITHOUT CONTRAST TECHNIQUE: Multidetector CT imaging of the head and cervical spine was performed following the standard protocol without intravenous contrast. Multiplanar CT image reconstructions of the cervical spine were also generated. COMPARISON:  None. FINDINGS: CT HEAD FINDINGS Brain: No evidence of acute infarction, hemorrhage, hydrocephalus, extra-axial collection, visible mass lesion or mass effect. Symmetric prominence of the ventricles, cisterns and sulci compatible with parenchymal volume loss with a slight frontal lobe predominance. Patchy areas of white matter hypoattenuation are most compatible with chronic microvascular angiopathy. Scattered dural calcifications. Insert tonsils normally position. Benign intracranial structures are unremarkable. Vascular: Atherosclerotic calcification of the carotid siphons. No hyperdense vessel. Skull: No significant scalp swelling, hematoma or calvarial fracture. Sinuses/Orbits: Paranasal sinuses and mastoid air cells are predominantly clear. Included orbital structures are unremarkable. Other: None CT CERVICAL SPINE FINDINGS Alignment: Stabilization collar absent at the time of exam. Mild cervical flexion noted scout view. Right lateral bending noted on coronal view. There is slight leftward cranial rotation as well.  Reversal the normal cervical lordosis likely related to cervical flexion and positioning. No evidence of traumatic listhesis. No abnormally widened, perched or jumped facets. Normal alignment of the  craniocervical and atlantoaxial articulations. Skull base and vertebrae: No acute skull base fracture. No vertebral body fracture or height loss. Normal bone mineralization. Benign bone island C5. No worrisome osseous lesions. Soft tissues and spinal canal: No pre or paravertebral fluid or swelling. No visible canal hematoma. Disc levels: Multilevel intervertebral disc height loss with spondylitic endplate changes. Slightly larger disc osteophyte complex noted C4-5 with partial effacement of the ventral thecal sac but no significant spinal canal. Uncinate spurring and facet hypertrophic changes are noted throughout the cervical levels, resulting in some moderate foraminal narrowing on right C4-5 and bilaterally C6-7. Upper chest: No acute abnormality in the upper chest or imaged lung apices. Other: No concerning thyroid nodules or masses. Punctate focus of intravenous gas in the left brachiocephalic vein likely related to intravenous access. IMPRESSION: 1. No acute intracranial abnormality. Calvarial fracture or significant swelling. 2. 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 No acute fracture or traumatic listhesis of the cervical spine. 4. Cervical spondylitic changes as described above. Electronically Signed   By: Kreg Shropshire M.D.   On: 02/03/2021 17:53   DG Chest Portable 1 View  Result Date: 02/03/2021 CLINICAL DATA:  Weakness. EXAM: PORTABLE CHEST 1 VIEW COMPARISON:  None. FINDINGS: The heart size and mediastinal contours are within normal limits. Both lungs are clear. No pneumothorax or pleural effusion is noted. Minimally displaced left seventh rib fracture is noted. IMPRESSION: Minimally displaced left seventh rib fracture. No acute cardiopulmonary abnormality. Electronically Signed   By: Lupita Raider M.D.   On: 02/03/2021 17:29      Subjective: Pt c/o fatigue    Discharge Exam: Vitals:   02/07/21 2328 02/08/21 0815  BP: 129/64 112/63  Pulse: 66 76  Resp:  16 18  Temp: 97.9 F (36.6 C) 97.8 F (36.6 C)  SpO2: 99% 100%   Vitals:   02/07/21 1536 02/07/21 2028 02/07/21 2328 02/08/21 0815  BP: 101/65 101/66 129/64 112/63  Pulse: 73 75 66 76  Resp: 16 16 16 18   Temp: 97.7 F (36.5 C) 97.7 F (36.5 C) 97.9 F (36.6 C) 97.8 F (36.6 C)  TempSrc: Oral Oral Oral Oral  SpO2: 98% 100% 99% 100%  Weight:      Height:        General: Pt is alert, awake, not in acute distress. Frail appearing  Cardiovascular:  S1/S2 +, no rubs, no gallops Respiratory: CTA bilaterally, no wheezing, no rhonchi Abdominal: Soft, NT, ND, bowel sounds + Extremities: b/l LE edema, no cyanosis    The results of significant diagnostics from this hospitalization (including imaging, microbiology, ancillary and laboratory) are listed below for reference.     Microbiology: Recent Results (from the past 240 hour(s))  Resp Panel by RT-PCR (Flu A&B, Covid) Nasopharyngeal Swab     Status: None   Collection Time: 02/03/21 10:31 PM   Specimen: Nasopharyngeal Swab; Nasopharyngeal(NP) swabs in vial transport medium  Result Value Ref Range Status   SARS Coronavirus 2 by RT PCR NEGATIVE NEGATIVE Final    Comment: (NOTE) SARS-CoV-2 target nucleic acids are NOT DETECTED.  The SARS-CoV-2 RNA is generally detectable in upper respiratory specimens during the acute phase of infection. The lowest concentration of SARS-CoV-2 viral copies this assay can detect is 138 copies/mL. A negative result does not preclude SARS-Cov-2 infection and should not  be used as the sole basis for treatment or other patient management decisions. A negative result may occur with  improper specimen collection/handling, submission of specimen other than nasopharyngeal swab, presence of viral mutation(s) within the areas targeted by this assay, and inadequate number of viral copies(<138 copies/mL). A negative result must be combined with clinical observations, patient history, and  epidemiological information. The expected result is Negative.  Fact Sheet for Patients:  BloggerCourse.comhttps://www.fda.gov/media/152166/download  Fact Sheet for Healthcare Providers:  SeriousBroker.ithttps://www.fda.gov/media/152162/download  This test is no t yet approved or cleared by the [place] FDA and  has been authorized for detection and/or diagnosis of SARS-CoV-2 by FDA under an Emergency Use Authorization (EUA). This EUA will remain  in effect (meaning this test can be used) for the duration of the COVID-19 declaration under Section 564(b)(1) of the Act, 21 U.S.C.section 360bbb-3(b)(1), unless the authorization is terminated  or revoked sooner.       Influenza A by PCR NEGATIVE NEGATIVE Final   Influenza B by PCR NEGATIVE NEGATIVE Final    Comment: (NOTE) The Xpert Xpress SARS-CoV-2/FLU/RSV plus assay is intended as an aid in the diagnosis of influenza from Nasopharyngeal swab specimens and should not be used as a sole basis for treatment. Nasal washings and aspirates are unacceptable for Xpert Xpress SARS-CoV-2/FLU/RSV testing.  Fact Sheet for Patients: BloggerCourse.comhttps://www.fda.gov/media/152166/download  Fact Sheet for Healthcare Providers: SeriousBroker.ithttps://www.fda.gov/media/152162/download  This test is not yet approved or cleared by the [place] FDA and has been authorized for detection and/or diagnosis of SARS-CoV-2 by FDA under an Emergency Use Authorization (EUA). This EUA will remain in effect (meaning this test can be used) for the duration of the COVID-19 declaration under Section 564(b)(1) of the Act, 21 U.S.C. section 360bbb-3(b)(1), unless the authorization is terminated or revoked.  Performed at [facility], [address] [place], [place] [address]   SARS CORONAVIRUS 2 (TAT 6-24 HRS) Nasopharyngeal Nasopharyngeal Swab     Status: None   Collection Time: 02/07/21  1:58 PM   Specimen: Nasopharyngeal Swab  Result Value Ref Range Status   SARS Coronavirus 2 NEGATIVE  NEGATIVE Final    Comment: (NOTE) SARS-CoV-2 target nucleic acids are NOT DETECTED.  The SARS-CoV-2 RNA is generally detectable in upper and lower respiratory specimens during the acute phase of infection. Negative results do not preclude SARS-CoV-2 infection, do not rule out co-infections with other pathogens, and should not be used as the sole basis for treatment or other patient management decisions. Negative results must be combined with clinical observations, patient history, and epidemiological information. The expected result is Negative.  Fact Sheet for Patients: HairSlick.nohttps://www.fda.gov/media/138098/download  Fact Sheet for Healthcare Providers: quierodirigir.comhttps://www.fda.gov/media/138095/download  This test is not yet approved or cleared by the [place] FDA and  has been authorized for detection and/or diagnosis of SARS-CoV-2 by FDA under an Emergency Use Authorization (EUA). This EUA will remain  in effect (meaning this test can be used) for the duration of the COVID-19 declaration under Se ction 564(b)(1) of the Act, 21 U.S.C. section 360bbb-3(b)(1), unless the authorization is terminated or revoked sooner.  Performed at [facility], [address] [place], [place] [address]      Labs: BNP (last 3 results) No results for input(s): BNP in the last 8760 hours. Basic Metabolic Panel: Recent Labs  Lab 02/04/21 0509 02/05/21 0512 02/06/21 0523 02/07/21 0422 02/08/21 0555  NA 140 138 139 139 139  K 3.6 3.7 3.6 3.8 3.9  CL 108 108 105 104 104  CO2 24 26 27 29 29   GLUCOSE 79 73 82 81 85  BUN 23 24* 17 18 22   CREATININE 0.59 0.59 0.47 0.62 0.50  CALCIUM 9.1 8.9 9.0 9.1 9.2   Liver Function Tests: No results for input(s): AST, ALT, ALKPHOS, BILITOT, PROT, ALBUMIN in the last 168 hours. No results for input(s): LIPASE, AMYLASE in the last 168 hours. No results for input(s): AMMONIA in the last 168 hours. CBC: Recent Labs  Lab 02/04/21 0509 02/05/21 0512  02/06/21 0523 02/07/21 0422 02/08/21 0555  WBC 4.2 3.3* 2.8* 3.4* 3.3*  HGB 11.3* 11.4* 11.4* 11.2* 12.0  HCT 33.0* 33.1* 32.2* 31.8* 34.8*  MCV 91.7 90.9 89.4 89.6 90.6  PLT 145* 134* 136* 149* 162   Cardiac Enzymes: Recent Labs  Lab 02/03/21 1710 02/04/21 0509 02/05/21 0512 02/06/21 0523  CKTOTAL 304* 517* 344* 240*   BNP: Invalid input(s): POCBNP CBG: No results for input(s): GLUCAP in the last 168 hours. D-Dimer No results for input(s): DDIMER in the last 72 hours. Hgb A1c No results for input(s): HGBA1C in the last 72 hours. Lipid Profile No results for input(s): CHOL, HDL, LDLCALC, TRIG, CHOLHDL, LDLDIRECT in the last 72 hours. Thyroid function studies No results for input(s): TSH, T4TOTAL, T3FREE, THYROIDAB in the last 72 hours.  Invalid input(s): FREET3 Anemia work up No results for input(s): VITAMINB12, FOLATE, FERRITIN, TIBC, IRON, RETICCTPCT in the last 72 hours. Urinalysis    Component Value Date/Time   COLORURINE YELLOW (A) 02/03/2021 1817   APPEARANCEUR CLEAR (A) 02/03/2021 1817   LABSPEC 1.028 02/03/2021 1817   PHURINE 5.0 02/03/2021 1817   GLUCOSEU NEGATIVE 02/03/2021 1817   HGBUR NEGATIVE 02/03/2021 1817   BILIRUBINUR NEGATIVE 02/03/2021 1817   KETONESUR 20 (A) 02/03/2021 1817   PROTEINUR 100 (A) 02/03/2021 1817   NITRITE NEGATIVE 02/03/2021 1817   LEUKOCYTESUR NEGATIVE 02/03/2021 1817   Sepsis Labs Invalid input(s): PROCALCITONIN,  WBC,  LACTICIDVEN Microbiology Recent Results (from the past 240 hour(s))  Resp Panel by RT-PCR (Flu A&B, Covid) Nasopharyngeal Swab     Status: None   Collection Time: 02/03/21 10:31 PM   Specimen: Nasopharyngeal Swab; Nasopharyngeal(NP) swabs in vial transport medium  Result Value Ref Range Status   SARS Coronavirus 2 by RT PCR NEGATIVE NEGATIVE Final    Comment: (NOTE) SARS-CoV-2 target nucleic acids are NOT DETECTED.  The SARS-CoV-2 RNA is generally detectable in upper respiratory specimens during the  acute phase of infection. The lowest concentration of SARS-CoV-2 viral copies this assay can detect is 138 copies/mL. A negative result does not preclude SARS-Cov-2 infection and should not be used as the sole basis for treatment or other patient management decisions. A negative result may occur with  improper specimen collection/handling, submission of specimen other than nasopharyngeal swab, presence of viral mutation(s) within the areas targeted by this assay, and inadequate number of viral copies(<138 copies/mL). A negative result must be combined with clinical observations, patient history, and epidemiological information. The expected result is Negative.  Fact Sheet for Patients:  02/05/2021  Fact Sheet for Healthcare Providers:  02/05/21  This test is no t yet approved or cleared by the BloggerCourse.[place] FDA and  has been authorized for detection and/or diagnosis of SARS-CoV-2 by FDA under an Emergency Use Authorization (EUA). This EUA will remain  in effect (meaning this test can be used) for the duration of the COVID-19 declaration under Section 564(b)(1) of the Act, 21 U.S.C.section 360bbb-3(b)(1), unless the authorization is terminated  or revoked sooner.  Influenza A by PCR NEGATIVE NEGATIVE Final   Influenza B by PCR NEGATIVE NEGATIVE Final    Comment: (NOTE) The Xpert Xpress SARS-CoV-2/FLU/RSV plus assay is intended as an aid in the diagnosis of influenza from Nasopharyngeal swab specimens and should not be used as a sole basis for treatment. Nasal washings and aspirates are unacceptable for Xpert Xpress SARS-CoV-2/FLU/RSV testing.  Fact Sheet for Patients: BloggerCourse.com  Fact Sheet for Healthcare Providers: SeriousBroker.it  This test is not yet approved or cleared by the [place] FDA and has been authorized for detection and/or  diagnosis of SARS-CoV-2 by FDA under an Emergency Use Authorization (EUA). This EUA will remain in effect (meaning this test can be used) for the duration of the COVID-19 declaration under Section 564(b)(1) of the Act, 21 U.S.C. section 360bbb-3(b)(1), unless the authorization is terminated or revoked.  Performed at [facility], [address] [place], [place] [address]   SARS CORONAVIRUS 2 (TAT 6-24 HRS) Nasopharyngeal Nasopharyngeal Swab     Status: None   Collection Time: 02/07/21  1:58 PM   Specimen: Nasopharyngeal Swab  Result Value Ref Range Status   SARS Coronavirus 2 NEGATIVE NEGATIVE Final    Comment: (NOTE) SARS-CoV-2 target nucleic acids are NOT DETECTED.  The SARS-CoV-2 RNA is generally detectable in upper and lower respiratory specimens during the acute phase of infection. Negative results do not preclude SARS-CoV-2 infection, do not rule out co-infections with other pathogens, and should not be used as the sole basis for treatment or other patient management decisions. Negative results must be combined with clinical observations, patient history, and epidemiological information. The expected result is Negative.  Fact Sheet for Patients: HairSlick.no  Fact Sheet for Healthcare Providers: quierodirigir.com  This test is not yet approved or cleared by the [place] FDA and  has been authorized for detection and/or diagnosis of SARS-CoV-2 by FDA under an Emergency Use Authorization (EUA). This EUA will remain  in effect (meaning this test can be used) for the duration of the COVID-19 declaration under Se ction 564(b)(1) of the Act, 21 U.S.C. section 360bbb-3(b)(1), unless the authorization is terminated or revoked sooner.  Performed at [facility], [address] [place], [place] [address]      Time coordinating discharge: Over 30 minutes  SIGNED:   Charise Killian, MD  [facility] 02/08/2021, 10:56 AM Pager   If 7PM-7AM, please contact night-coverage

## 2021-02-08 NOTE — Progress Notes (Signed)
Mobility Specialist - Progress Note   02/08/21 1311  Mobility  Activity Ambulated in hall  Level of Assistance Standby assist, set-up cues, supervision of patient - no hands on  Assistive Device Front wheel walker  Distance Ambulated (ft) 200 ft  Mobility Ambulated with assistance in hallway  Mobility Response Tolerated well  Mobility performed by Mobility specialist  $Mobility charge 1 Mobility    Pt sitting in recliner on arrival. Voiced pain in ribs 4/10 and thighs 3/10. Pt clarifies that LE pain only occurs during sit-stand transfers. MinA to stand. Pt ambulated in hallway with RW. No LOB. Mild SOB on RA, O2 91-95% during activity. PLB engaged throughout session. Pt initially denied dizziness, but does report feeling "a little fuzzy around the edges" during ambulation. Pt left in recliner with all needs in reach.    Filiberto Pinks Mobility Specialist 02/08/21, 1:19 PM

## 2021-02-08 NOTE — Progress Notes (Signed)
Report called to [employer].  To transport via wheelchair

## 2021-02-09 DIAGNOSIS — R296 Repeated falls: Secondary | ICD-10-CM | POA: Insufficient documentation

## 2021-02-09 DIAGNOSIS — F3340 Major depressive disorder, recurrent, in remission, unspecified: Secondary | ICD-10-CM | POA: Diagnosis not present

## 2021-02-09 DIAGNOSIS — G20 Parkinson's disease: Secondary | ICD-10-CM | POA: Diagnosis not present

## 2021-03-01 DIAGNOSIS — S2242XD Multiple fractures of ribs, left side, subsequent encounter for fracture with routine healing: Secondary | ICD-10-CM | POA: Diagnosis not present

## 2021-03-01 DIAGNOSIS — G20 Parkinson's disease: Secondary | ICD-10-CM | POA: Diagnosis not present

## 2021-03-01 DIAGNOSIS — F3340 Major depressive disorder, recurrent, in remission, unspecified: Secondary | ICD-10-CM | POA: Diagnosis not present

## 2021-03-01 DIAGNOSIS — Z9181 History of falling: Secondary | ICD-10-CM | POA: Diagnosis not present

## 2021-03-01 DIAGNOSIS — M8580 Other specified disorders of bone density and structure, unspecified site: Secondary | ICD-10-CM | POA: Diagnosis not present

## 2021-03-01 DIAGNOSIS — R32 Unspecified urinary incontinence: Secondary | ICD-10-CM | POA: Diagnosis not present

## 2021-03-04 DIAGNOSIS — L309 Dermatitis, unspecified: Secondary | ICD-10-CM | POA: Diagnosis not present

## 2021-03-04 DIAGNOSIS — Z23 Encounter for immunization: Secondary | ICD-10-CM | POA: Diagnosis not present

## 2021-03-04 DIAGNOSIS — Z0000 Encounter for general adult medical examination without abnormal findings: Secondary | ICD-10-CM | POA: Diagnosis not present

## 2021-03-04 DIAGNOSIS — G20 Parkinson's disease: Secondary | ICD-10-CM | POA: Diagnosis not present

## 2021-03-04 DIAGNOSIS — F3340 Major depressive disorder, recurrent, in remission, unspecified: Secondary | ICD-10-CM | POA: Diagnosis not present

## 2021-03-04 DIAGNOSIS — Z9181 History of falling: Secondary | ICD-10-CM | POA: Diagnosis not present

## 2021-03-04 DIAGNOSIS — R600 Localized edema: Secondary | ICD-10-CM | POA: Diagnosis not present

## 2021-03-04 DIAGNOSIS — M8580 Other specified disorders of bone density and structure, unspecified site: Secondary | ICD-10-CM | POA: Diagnosis not present

## 2021-03-08 DIAGNOSIS — Z9181 History of falling: Secondary | ICD-10-CM | POA: Diagnosis not present

## 2021-03-08 DIAGNOSIS — S2242XD Multiple fractures of ribs, left side, subsequent encounter for fracture with routine healing: Secondary | ICD-10-CM | POA: Diagnosis not present

## 2021-03-08 DIAGNOSIS — R32 Unspecified urinary incontinence: Secondary | ICD-10-CM | POA: Diagnosis not present

## 2021-03-08 DIAGNOSIS — F3340 Major depressive disorder, recurrent, in remission, unspecified: Secondary | ICD-10-CM | POA: Diagnosis not present

## 2021-03-08 DIAGNOSIS — M8580 Other specified disorders of bone density and structure, unspecified site: Secondary | ICD-10-CM | POA: Diagnosis not present

## 2021-03-08 DIAGNOSIS — G20 Parkinson's disease: Secondary | ICD-10-CM | POA: Diagnosis not present

## 2021-03-09 DIAGNOSIS — F3340 Major depressive disorder, recurrent, in remission, unspecified: Secondary | ICD-10-CM | POA: Diagnosis not present

## 2021-03-09 DIAGNOSIS — S2242XD Multiple fractures of ribs, left side, subsequent encounter for fracture with routine healing: Secondary | ICD-10-CM | POA: Diagnosis not present

## 2021-03-09 DIAGNOSIS — M8580 Other specified disorders of bone density and structure, unspecified site: Secondary | ICD-10-CM | POA: Diagnosis not present

## 2021-03-09 DIAGNOSIS — Z9181 History of falling: Secondary | ICD-10-CM | POA: Diagnosis not present

## 2021-03-09 DIAGNOSIS — R32 Unspecified urinary incontinence: Secondary | ICD-10-CM | POA: Diagnosis not present

## 2021-03-09 DIAGNOSIS — G20 Parkinson's disease: Secondary | ICD-10-CM | POA: Diagnosis not present

## 2021-03-25 DIAGNOSIS — G20 Parkinson's disease: Secondary | ICD-10-CM | POA: Diagnosis not present

## 2021-03-25 DIAGNOSIS — F3340 Major depressive disorder, recurrent, in remission, unspecified: Secondary | ICD-10-CM | POA: Diagnosis not present

## 2021-03-25 DIAGNOSIS — Z9181 History of falling: Secondary | ICD-10-CM | POA: Diagnosis not present

## 2021-03-25 DIAGNOSIS — S2242XD Multiple fractures of ribs, left side, subsequent encounter for fracture with routine healing: Secondary | ICD-10-CM | POA: Diagnosis not present

## 2021-03-25 DIAGNOSIS — M8580 Other specified disorders of bone density and structure, unspecified site: Secondary | ICD-10-CM | POA: Diagnosis not present

## 2021-03-25 DIAGNOSIS — R32 Unspecified urinary incontinence: Secondary | ICD-10-CM | POA: Diagnosis not present

## 2021-04-08 DIAGNOSIS — S2242XD Multiple fractures of ribs, left side, subsequent encounter for fracture with routine healing: Secondary | ICD-10-CM | POA: Diagnosis not present

## 2021-04-08 DIAGNOSIS — Z9181 History of falling: Secondary | ICD-10-CM | POA: Diagnosis not present

## 2021-04-08 DIAGNOSIS — M8580 Other specified disorders of bone density and structure, unspecified site: Secondary | ICD-10-CM | POA: Diagnosis not present

## 2021-04-08 DIAGNOSIS — G20 Parkinson's disease: Secondary | ICD-10-CM | POA: Diagnosis not present

## 2021-04-08 DIAGNOSIS — F3340 Major depressive disorder, recurrent, in remission, unspecified: Secondary | ICD-10-CM | POA: Diagnosis not present

## 2021-04-08 DIAGNOSIS — R32 Unspecified urinary incontinence: Secondary | ICD-10-CM | POA: Diagnosis not present

## 2021-04-29 DIAGNOSIS — M8580 Other specified disorders of bone density and structure, unspecified site: Secondary | ICD-10-CM | POA: Diagnosis not present

## 2021-04-29 DIAGNOSIS — G20 Parkinson's disease: Secondary | ICD-10-CM | POA: Diagnosis not present

## 2021-04-29 DIAGNOSIS — F3340 Major depressive disorder, recurrent, in remission, unspecified: Secondary | ICD-10-CM | POA: Diagnosis not present

## 2021-04-29 DIAGNOSIS — S2242XD Multiple fractures of ribs, left side, subsequent encounter for fracture with routine healing: Secondary | ICD-10-CM | POA: Diagnosis not present

## 2021-04-29 DIAGNOSIS — Z9181 History of falling: Secondary | ICD-10-CM | POA: Diagnosis not present

## 2021-04-29 DIAGNOSIS — R32 Unspecified urinary incontinence: Secondary | ICD-10-CM | POA: Diagnosis not present

## 2021-06-02 DIAGNOSIS — B351 Tinea unguium: Secondary | ICD-10-CM | POA: Diagnosis not present

## 2021-06-02 DIAGNOSIS — G20 Parkinson's disease: Secondary | ICD-10-CM | POA: Diagnosis not present

## 2021-06-28 DIAGNOSIS — L309 Dermatitis, unspecified: Secondary | ICD-10-CM | POA: Diagnosis not present

## 2021-06-28 DIAGNOSIS — Z0000 Encounter for general adult medical examination without abnormal findings: Secondary | ICD-10-CM | POA: Diagnosis not present

## 2021-06-28 DIAGNOSIS — G20 Parkinson's disease: Secondary | ICD-10-CM | POA: Diagnosis not present

## 2021-06-28 DIAGNOSIS — F3340 Major depressive disorder, recurrent, in remission, unspecified: Secondary | ICD-10-CM | POA: Diagnosis not present

## 2021-06-28 DIAGNOSIS — M8580 Other specified disorders of bone density and structure, unspecified site: Secondary | ICD-10-CM | POA: Diagnosis not present

## 2021-07-05 DIAGNOSIS — Z23 Encounter for immunization: Secondary | ICD-10-CM | POA: Diagnosis not present

## 2021-07-05 DIAGNOSIS — F3340 Major depressive disorder, recurrent, in remission, unspecified: Secondary | ICD-10-CM | POA: Diagnosis not present

## 2021-07-05 DIAGNOSIS — L608 Other nail disorders: Secondary | ICD-10-CM | POA: Diagnosis not present

## 2021-07-05 DIAGNOSIS — R159 Full incontinence of feces: Secondary | ICD-10-CM | POA: Diagnosis not present

## 2021-07-05 DIAGNOSIS — R296 Repeated falls: Secondary | ICD-10-CM | POA: Diagnosis not present

## 2021-07-05 DIAGNOSIS — G20 Parkinson's disease: Secondary | ICD-10-CM | POA: Diagnosis not present

## 2021-07-05 DIAGNOSIS — M8580 Other specified disorders of bone density and structure, unspecified site: Secondary | ICD-10-CM | POA: Diagnosis not present

## 2021-12-30 DIAGNOSIS — H524 Presbyopia: Secondary | ICD-10-CM | POA: Diagnosis not present

## 2021-12-30 DIAGNOSIS — H2513 Age-related nuclear cataract, bilateral: Secondary | ICD-10-CM | POA: Diagnosis not present

## 2022-01-06 DIAGNOSIS — R159 Full incontinence of feces: Secondary | ICD-10-CM | POA: Diagnosis not present

## 2022-01-06 DIAGNOSIS — M8580 Other specified disorders of bone density and structure, unspecified site: Secondary | ICD-10-CM | POA: Diagnosis not present

## 2022-01-06 DIAGNOSIS — F3340 Major depressive disorder, recurrent, in remission, unspecified: Secondary | ICD-10-CM | POA: Diagnosis not present

## 2022-01-06 DIAGNOSIS — Z23 Encounter for immunization: Secondary | ICD-10-CM | POA: Diagnosis not present

## 2022-01-06 DIAGNOSIS — R296 Repeated falls: Secondary | ICD-10-CM | POA: Diagnosis not present

## 2022-01-06 DIAGNOSIS — G20 Parkinson's disease: Secondary | ICD-10-CM | POA: Diagnosis not present

## 2022-01-13 DIAGNOSIS — M79672 Pain in left foot: Secondary | ICD-10-CM | POA: Diagnosis not present

## 2022-01-13 DIAGNOSIS — E538 Deficiency of other specified B group vitamins: Secondary | ICD-10-CM | POA: Diagnosis not present

## 2022-01-13 DIAGNOSIS — Z0000 Encounter for general adult medical examination without abnormal findings: Secondary | ICD-10-CM | POA: Diagnosis not present

## 2022-01-13 DIAGNOSIS — F3340 Major depressive disorder, recurrent, in remission, unspecified: Secondary | ICD-10-CM | POA: Diagnosis not present

## 2022-01-13 DIAGNOSIS — R296 Repeated falls: Secondary | ICD-10-CM | POA: Diagnosis not present

## 2022-01-13 DIAGNOSIS — G20 Parkinson's disease: Secondary | ICD-10-CM | POA: Diagnosis not present

## 2022-05-25 ENCOUNTER — Other Ambulatory Visit: Payer: Self-pay

## 2022-05-25 ENCOUNTER — Inpatient Hospital Stay
Admission: EM | Admit: 2022-05-25 | Discharge: 2022-05-29 | DRG: 566 | Disposition: A | Discharge status: Skilled Nursing Facility | Payer: PPO | Attending: Internal Medicine | Admitting: Internal Medicine

## 2022-05-25 ENCOUNTER — Emergency Department: Payer: PPO

## 2022-05-25 DIAGNOSIS — I959 Hypotension, unspecified: Secondary | ICD-10-CM | POA: Diagnosis not present

## 2022-05-25 DIAGNOSIS — Z043 Encounter for examination and observation following other accident: Secondary | ICD-10-CM | POA: Diagnosis not present

## 2022-05-25 DIAGNOSIS — W06XXXA Fall from bed, initial encounter: Secondary | ICD-10-CM | POA: Diagnosis present

## 2022-05-25 DIAGNOSIS — M623 Immobility syndrome (paraplegic): Secondary | ICD-10-CM | POA: Diagnosis not present

## 2022-05-25 DIAGNOSIS — Z66 Do not resuscitate: Secondary | ICD-10-CM | POA: Diagnosis present

## 2022-05-25 DIAGNOSIS — R2681 Unsteadiness on feet: Secondary | ICD-10-CM | POA: Diagnosis not present

## 2022-05-25 DIAGNOSIS — W19XXXD Unspecified fall, subsequent encounter: Secondary | ICD-10-CM | POA: Diagnosis not present

## 2022-05-25 DIAGNOSIS — R498 Other voice and resonance disorders: Secondary | ICD-10-CM | POA: Diagnosis not present

## 2022-05-25 DIAGNOSIS — T796XXA Traumatic ischemia of muscle, initial encounter: Principal | ICD-10-CM

## 2022-05-25 DIAGNOSIS — M1611 Unilateral primary osteoarthritis, right hip: Secondary | ICD-10-CM | POA: Diagnosis not present

## 2022-05-25 DIAGNOSIS — R488 Other symbolic dysfunctions: Secondary | ICD-10-CM | POA: Diagnosis not present

## 2022-05-25 DIAGNOSIS — W19XXXA Unspecified fall, initial encounter: Secondary | ICD-10-CM | POA: Diagnosis not present

## 2022-05-25 DIAGNOSIS — Z79899 Other long term (current) drug therapy: Secondary | ICD-10-CM | POA: Diagnosis not present

## 2022-05-25 DIAGNOSIS — Z7401 Bed confinement status: Secondary | ICD-10-CM | POA: Diagnosis not present

## 2022-05-25 DIAGNOSIS — F32A Depression, unspecified: Secondary | ICD-10-CM | POA: Diagnosis present

## 2022-05-25 DIAGNOSIS — Y92003 Bedroom of unspecified non-institutional (private) residence as the place of occurrence of the external cause: Secondary | ICD-10-CM

## 2022-05-25 DIAGNOSIS — G20A1 Parkinson's disease without dyskinesia, without mention of fluctuations: Secondary | ICD-10-CM | POA: Diagnosis present

## 2022-05-25 DIAGNOSIS — G20 Parkinson's disease: Secondary | ICD-10-CM | POA: Diagnosis not present

## 2022-05-25 DIAGNOSIS — M47816 Spondylosis without myelopathy or radiculopathy, lumbar region: Secondary | ICD-10-CM | POA: Diagnosis not present

## 2022-05-25 DIAGNOSIS — M792 Neuralgia and neuritis, unspecified: Secondary | ICD-10-CM | POA: Diagnosis not present

## 2022-05-25 DIAGNOSIS — R531 Weakness: Secondary | ICD-10-CM | POA: Diagnosis not present

## 2022-05-25 DIAGNOSIS — M25551 Pain in right hip: Secondary | ICD-10-CM | POA: Diagnosis not present

## 2022-05-25 DIAGNOSIS — M25511 Pain in right shoulder: Secondary | ICD-10-CM | POA: Diagnosis not present

## 2022-05-25 DIAGNOSIS — T796XXS Traumatic ischemia of muscle, sequela: Secondary | ICD-10-CM | POA: Diagnosis not present

## 2022-05-25 DIAGNOSIS — M25572 Pain in left ankle and joints of left foot: Secondary | ICD-10-CM | POA: Diagnosis not present

## 2022-05-25 DIAGNOSIS — Z736 Limitation of activities due to disability: Secondary | ICD-10-CM | POA: Diagnosis not present

## 2022-05-25 DIAGNOSIS — M6282 Rhabdomyolysis: Secondary | ICD-10-CM | POA: Diagnosis not present

## 2022-05-25 DIAGNOSIS — M6259 Muscle wasting and atrophy, not elsewhere classified, multiple sites: Secondary | ICD-10-CM | POA: Diagnosis not present

## 2022-05-25 HISTORY — DX: Parkinson's disease: G20

## 2022-05-25 HISTORY — DX: Depression, unspecified: F32.A

## 2022-05-25 HISTORY — DX: Parkinson's disease without dyskinesia, without mention of fluctuations: G20.A1

## 2022-05-25 LAB — CBC WITH DIFFERENTIAL/PLATELET
Abs Immature Granulocytes: 0.01 10*3/uL (ref 0.00–0.07)
Basophils Absolute: 0 10*3/uL (ref 0.0–0.1)
Basophils Relative: 0 %
Eosinophils Absolute: 0 10*3/uL (ref 0.0–0.5)
Eosinophils Relative: 0 %
HCT: 35.5 % — ABNORMAL LOW (ref 36.0–46.0)
Hemoglobin: 12.1 g/dL (ref 12.0–15.0)
Immature Granulocytes: 0 %
Lymphocytes Relative: 4 %
Lymphs Abs: 0.3 10*3/uL — ABNORMAL LOW (ref 0.7–4.0)
MCH: 31.5 pg (ref 26.0–34.0)
MCHC: 34.1 g/dL (ref 30.0–36.0)
MCV: 92.4 fL (ref 80.0–100.0)
Monocytes Absolute: 0.2 10*3/uL (ref 0.1–1.0)
Monocytes Relative: 3 %
Neutro Abs: 5.9 10*3/uL (ref 1.7–7.7)
Neutrophils Relative %: 93 %
Platelets: 191 10*3/uL (ref 150–400)
RBC: 3.84 MIL/uL — ABNORMAL LOW (ref 3.87–5.11)
RDW: 14.1 % (ref 11.5–15.5)
WBC: 7.1 10*3/uL (ref 4.0–10.5)
nRBC: 0 % (ref 0.0–0.2)

## 2022-05-25 LAB — BASIC METABOLIC PANEL
Anion gap: 8 (ref 5–15)
BUN: 17 mg/dL (ref 8–23)
CO2: 28 mmol/L (ref 22–32)
Calcium: 9 mg/dL (ref 8.9–10.3)
Chloride: 107 mmol/L (ref 98–111)
Creatinine, Ser: 0.63 mg/dL (ref 0.44–1.00)
GFR, Estimated: >60 mL/min (ref >60)
Glucose, Bld: 118 mg/dL — ABNORMAL HIGH (ref 70–99)
Potassium: 3.6 mmol/L (ref 3.5–5.1)
Sodium: 143 mmol/L (ref 135–145)

## 2022-05-25 LAB — CK: Total CK: 928 U/L — ABNORMAL HIGH (ref 38–234)

## 2022-05-25 MED ORDER — ONDANSETRON HCL 4 MG/2ML IJ SOLN: 4.0000 mg | Freq: Three times a day (TID) | INTRAMUSCULAR | Status: DC | PRN

## 2022-05-25 MED ORDER — CARBIDOPA-LEVODOPA 25-100 MG PO TABS
1.0000 | ORAL_TABLET | Freq: Three times a day (TID) | ORAL | Status: DC
  Administered 2022-05-25 – 2022-05-29 (×12): 1 via ORAL
  Filled 2022-05-25 (×13): qty 1

## 2022-05-25 MED ORDER — DULOXETINE HCL 30 MG PO CPEP
30.0000 mg | ORAL_CAPSULE | Freq: Every day | ORAL | Status: DC
  Administered 2022-05-25 – 2022-05-29 (×5): 30 mg via ORAL
  Filled 2022-05-25 (×6): qty 1

## 2022-05-25 MED ORDER — ACETAMINOPHEN 325 MG PO TABS
650.0000 mg | ORAL_TABLET | Freq: Four times a day (QID) | ORAL | Status: DC | PRN
  Administered 2022-05-25 – 2022-05-27 (×3): 650 mg via ORAL
  Filled 2022-05-25 (×3): qty 2

## 2022-05-25 MED ORDER — SODIUM CHLORIDE 0.9 % IV SOLN: INTRAVENOUS | Status: DC

## 2022-05-25 MED ORDER — SODIUM CHLORIDE 0.9 % IV BOLUS
1000.0000 mL | Freq: Once | INTRAVENOUS | Status: AC
  Administered 2022-05-25: 1000 mL via INTRAVENOUS

## 2022-05-25 MED ORDER — GABAPENTIN 100 MG PO CAPS
100.0000 mg | ORAL_CAPSULE | Freq: Three times a day (TID) | ORAL | Status: DC
  Administered 2022-05-25 – 2022-05-29 (×12): 100 mg via ORAL
  Filled 2022-05-25 (×12): qty 1

## 2022-05-25 MED ORDER — ENOXAPARIN SODIUM 40 MG/0.4ML IJ SOSY
40.0000 mg | PREFILLED_SYRINGE | INTRAMUSCULAR | Status: DC
  Administered 2022-05-25 – 2022-05-29 (×5): 40 mg via SUBCUTANEOUS
  Filled 2022-05-25 (×5): qty 0.4

## 2022-05-25 MED ORDER — ACETAMINOPHEN 325 MG PO TABS
650.0000 mg | ORAL_TABLET | Freq: Once | ORAL | Status: AC
  Administered 2022-05-25: 650 mg via ORAL
  Filled 2022-05-25: qty 2

## 2022-05-25 NOTE — Evaluation (Signed)
Occupational Therapy Evaluation Patient Details Name: Michele Brewer MRN: [id number] DOB: 07/07/1951 Today's Date: 05/25/2022   History of Present Illness Patient is a 71 year old female with who presents after a fall from the bed where she was unable to get up from the ground.  Head CT negative for acute findings. Imaging of shoulder and hip with no fractures reported.  Medical history significant of Parkinson's disease, depression, rhabdomyolysis   Clinical Impression   Chart reviewed, RN cleared pt for participation in OT evaluation. Co tx completed with PT on this date. Pt is oriented x4. PTA pt reports she is MOD I for ADL/IADL, uses [facility] for home/community amb, history of falls. Pt presents with deficits in strength, endurance, activity tolerance, balance on this date affecting safe and optimal ADL completion. Tremor noted throughout B hands, pt reports she was able to feed herself today without concern. Pt requires MAX A for bed mobility, MOD A +2 for STS, MOD A +2 for step pivot transfer to bedside commode. Intermittent-frequent vcs required throughout for safety/technique. Pt is performing below PLOF, would benefit from STR to address functional deficits. Pt is left as received, NAD, all needs met. OT will follow acutely.      Recommendations for follow up therapy are one component of a multi-disciplinary discharge planning process, led by the attending physician.  Recommendations may be updated based on patient status, additional functional criteria and insurance authorization.   Follow Up Recommendations  Skilled nursing-short term rehab (<3 hours/day)    Assistance Recommended at Discharge Frequent or constant Supervision/Assistance  Patient can return home with the following A lot of help with walking and/or transfers;A lot of help with bathing/dressing/bathroom    Functional Status Assessment  Patient has had a recent decline in their functional status and demonstrates the ability to  make significant improvements in function in a reasonable and predictable amount of time.  Equipment Recommendations  Other (comment) (per next venue of care)    Recommendations for Other Services       Precautions / Restrictions Precautions Precautions: Fall Restrictions Weight Bearing Restrictions: No      Mobility Bed Mobility Overal bed mobility: Needs Assistance Bed Mobility: Supine to Sit, Sit to Supine     Supine to sit: Max assist Sit to supine: Max assist, +2 for physical assistance   General bed mobility comments: increased assist due to high stretcher height, intermittent vcs for technique    Transfers Overall transfer level: Needs assistance Equipment used: 2 person hand held assist Transfers: Bed to chair/wheelchair/BSC   Stand pivot transfers: Mod assist, +2 physical assistance   Step pivot transfers: Mod assist, +2 physical assistance            Balance Overall balance assessment: Needs assistance, History of Falls Sitting-balance support: Feet supported Sitting balance-Leahy Scale: Fair   Postural control: Posterior lean   Standing balance-Leahy Scale: Poor                             ADL either performed or assessed with clinical judgement   ADL Overall ADL's : Needs assistance/impaired     Grooming: Wash/dry hands;Sitting;Set up               Lower Body Dressing: Maximal assistance;Sit to/from stand Lower Body Dressing Details (indicate cue type and reason): underwear, socks Toilet Transfer: Moderate assistance;+2 for physical assistance;+2 for safety/equipment;BSC/3in1;Stand-pivot;Cueing for safety   Toileting- Clothing Manipulation and Hygiene: Maximal assistance;Sit  to/from stand;Cueing for safety               Vision Patient Visual Report: No change from baseline       Perception     Praxis      Pertinent Vitals/Pain Pain Assessment Pain Assessment: 0-10 Pain Score: 1  Pain Location: right side,  generalized Pain Descriptors / Indicators: Discomfort Pain Intervention(s): Limited activity within patient's tolerance, Monitored during session     Hand Dominance Right   Extremity/Trunk Assessment Upper Extremity Assessment Upper Extremity Assessment: Generalized weakness (tremor noted)   Lower Extremity Assessment Lower Extremity Assessment: Generalized weakness       Communication Communication Communication: No difficulties   Cognition Arousal/Alertness: Awake/alert Behavior During Therapy: WFL for tasks assessed/performed Overall Cognitive Status: Within Functional Limits for tasks assessed                                       General Comments       Exercises Other Exercises Other Exercises: edu re: role of rehab, falls prevention, home safety   Shoulder Instructions      Home Living Family/patient expects to be discharged to:: Private residence Living Arrangements: Other relatives (mother, sister) Available Help at Discharge: Family Type of Home: House Home Access: Ramped entrance     Home Layout: One level     Bathroom Shower/Tub: Walk-in shower         Home Equipment: BSC/3in1;Cane - single point;Toilet riser;Shower seat          Prior Functioning/Environment Prior Level of Function : Independent/Modified Independent;History of Falls (last six months)             Mobility Comments: [facility] for household/community amb, reports "it has not been enough" lately ADLs Comments: MOD I in ADL, assist for IADL as needed        OT Problem List: Decreased strength;Decreased activity tolerance;Impaired balance (sitting and/or standing);Decreased coordination;Decreased safety awareness;Decreased knowledge of use of DME or AE      OT Treatment/Interventions: Self-care/ADL training;Patient/family education;Therapeutic exercise;Balance training;Energy conservation;Therapeutic activities;DME and/or AE instruction    OT Goals(Current goals  can be found in the care plan section) Acute Rehab OT Goals Patient Stated Goal: return to PLOF OT Goal Formulation: With patient Time For Goal Achievement: 06/08/22 Potential to Achieve Goals: Good ADL Goals Pt Will Perform Grooming: with modified independence;sitting;standing Pt Will Perform Lower Body Dressing: with modified independence;sit to/from stand Pt Will Transfer to Toilet: with modified independence Pt Will Perform Toileting - Clothing Manipulation and hygiene: with modified independence;sit to/from stand  OT Frequency: Min 2X/week    Co-evaluation PT/OT/SLP Co-Evaluation/Treatment: Yes Reason for Co-Treatment: Complexity of the patient's impairments (multi-system involvement);To address functional/ADL transfers PT goals addressed during session: Mobility/safety with mobility OT goals addressed during session: ADL's and self-care      AM-PAC OT "6 Clicks" Daily Activity     Outcome Measure Help from another person eating meals?: None Help from another person taking care of personal grooming?: A Little Help from another person toileting, which includes using toliet, bedpan, or urinal?: A Lot Help from another person bathing (including washing, rinsing, drying)?: A Lot Help from another person to put on and taking off regular upper body clothing?: A Little Help from another person to put on and taking off regular lower body clothing?: A Lot 6 Click Score: 16   End of Session Nurse Communication: Mobility status  Activity Tolerance: Patient tolerated treatment well Patient left: in bed;with call bell/phone within reach  OT Visit Diagnosis: Unsteadiness on feet (R26.81);History of falling (Z91.81)                Time: 1720-9106 OT Time Calculation (min): 23 min Charges:  OT General Charges $OT Visit: 1 Visit OT Evaluation $OT Eval Moderate Complexity: 1 Mod  Shanon Payor, OTD OTR/L  05/25/22, 4:19 PM

## 2022-05-25 NOTE — Assessment & Plan Note (Signed)
Continue Cymbalta.

## 2022-05-25 NOTE — Assessment & Plan Note (Signed)
Continue Sinemet ?

## 2022-05-25 NOTE — ED Notes (Signed)
Pt at bedside

## 2022-05-25 NOTE — Assessment & Plan Note (Signed)
CK is 928. GFR >60.  -place in med-surg bed for obs -IVF: 1L of NSm then 100 cc/h -repeat CK in AM

## 2022-05-25 NOTE — ED Triage Notes (Signed)
Pt to ED via EMS from home. Pt fell last around 11pm, complaining of Right hip pain with no obvious shortening or deformity per EMS. Pt not complaining of any pain currently.  Received Fentanyl and 4mg  Zofran by EMS.  BP 120's CBG 140 95% RA

## 2022-05-25 NOTE — Assessment & Plan Note (Signed)
CT-head negative. -PT/TO -fall precaution.

## 2022-05-25 NOTE — Evaluation (Signed)
Physical Therapy Evaluation Patient Details Name: Michele Brewer MRN: [id number] DOB: 1951/02/02 Today's Date: 05/25/2022  History of Present Illness  Patient is a 71 year old female with who presents after a fall from the bed where she was unable to get up from the ground.  Head CT negative for acute findings. Imaging of shoulder and hip with no fractures reported.  Medical history significant of Parkinson's disease, depression, rhabdomyolysis   Clinical Impression  Patient is agreeable to PT. She is pleasant and cooperative. She reports she is ambulatory with a cane at baseline. Today the patient has notable tremors in BUE that she reports come and go at baseline. She complains of mild right side pain with movement. She was able to stand and pivot to the bed side commode and back to bed. +2 person assistance required for transfers at this time. Unable to safely attempt ambulation due to limited standing tolerance and fatigue with activity. The patient is not at her baseline level of functional mobility and would benefit from continued PT to maximize independence and facilitate return to prior level of function. SNF is recommended at this time.      Recommendations for follow up therapy are one component of a multi-disciplinary discharge planning process, led by the attending physician.  Recommendations may be updated based on patient status, additional functional criteria and insurance authorization.  Follow Up Recommendations Skilled nursing-short term rehab (<3 hours/day) Can patient physically be transported by private vehicle: No    Assistance Recommended at Discharge Frequent or constant Supervision/Assistance  Patient can return home with the following  Two people to help with walking and/or transfers;A lot of help with bathing/dressing/bathroom;Help with stairs or ramp for entrance;Assist for transportation;Assistance with cooking/housework    Equipment Recommendations  (to de  betermined at next level of care)  Recommendations for Other Services       Functional Status Assessment Patient has had a recent decline in their functional status and demonstrates the ability to make significant improvements in function in a reasonable and predictable amount of time.     Precautions / Restrictions Precautions Precautions: Fall Restrictions Weight Bearing Restrictions: No      Mobility  Bed Mobility Overal bed mobility: Needs Assistance Bed Mobility: Supine to Sit, Sit to Supine     Supine to sit: Max assist Sit to supine: Max assist, +2 for physical assistance   General bed mobility comments: increased assistance required for returning to bed partially due to high stretcher height. cues for technique    Transfers Overall transfer level: Needs assistance Equipment used: 2 person hand held assist Transfers: Bed to chair/wheelchair/BSC   Stand pivot transfers: Mod assist, +2 physical assistance Step pivot transfers: Mod assist, +2 physical assistance       General transfer comment: verbal cues for hand placement and technique for standing. +2 person assistance reuqired    Ambulation/Gait               General Gait Details: not attempted due to poor standing tolerance and fatigue with activity  Stairs            Wheelchair Mobility    Modified Rankin (Stroke Patients Only)       Balance Overall balance assessment: Needs assistance, History of Falls Sitting-balance support: Feet supported Sitting balance-Leahy Scale: Fair       Standing balance-Leahy Scale: Poor Standing balance comment: Mod A required to maintain standing balance for peri-hygiene following bowel movement. limited standing tolerance for progression of ambulation  Pertinent Vitals/Pain Pain Assessment Pain Assessment: 0-10 Pain Score: 1  (reported at rest) Pain Location: right side Pain Descriptors / Indicators:  Discomfort Pain Intervention(s): Limited activity within patient's tolerance, Monitored during session    Home Living Family/patient expects to be discharged to:: Private residence Living Arrangements: Other relatives Available Help at Discharge: Family Type of Home: House Home Access: Ramped entrance       Home Layout: One level Home Equipment: BSC/3in1;Cane - single point      Prior Function Prior Level of Function : Independent/Modified Independent;History of Falls (last six months)             Mobility Comments: using cane for ambulation       Hand Dominance        Extremity/Trunk Assessment   Upper Extremity Assessment Upper Extremity Assessment: Generalized weakness (tremor noted)    Lower Extremity Assessment Lower Extremity Assessment: Generalized weakness       Communication   Communication: No difficulties  Cognition Arousal/Alertness: Awake/alert Behavior During Therapy: WFL for tasks assessed/performed Overall Cognitive Status: Within Functional Limits for tasks assessed                                          General Comments      Exercises     Assessment/Plan    PT Assessment Patient needs continued PT services  PT Problem List Decreased strength;Decreased activity tolerance;Decreased balance;Decreased mobility       PT Treatment Interventions DME instruction;Gait training;Functional mobility training;Therapeutic activities;Therapeutic exercise;Balance training;Neuromuscular re-education    PT Goals (Current goals can be found in the Care Plan section)  Acute Rehab PT Goals Patient Stated Goal: to regain strength PT Goal Formulation: With patient Time For Goal Achievement: 06/08/22 Potential to Achieve Goals: Good    Frequency Min 2X/week     Co-evaluation PT/OT/SLP Co-Evaluation/Treatment: Yes Reason for Co-Treatment: Complexity of the patient's impairments (multi-system involvement);To address  functional/ADL transfers PT goals addressed during session: Mobility/safety with mobility         AM-PAC PT "6 Clicks" Mobility  Outcome Measure Help needed turning from your back to your side while in a flat bed without using bedrails?: A Lot Help needed moving from lying on your back to sitting on the side of a flat bed without using bedrails?: A Lot Help needed moving to and from a bed to a chair (including a wheelchair)?: Total Help needed standing up from a chair using your arms (e.g., wheelchair or bedside chair)?: Total Help needed to walk in hospital room?: Total Help needed climbing 3-5 steps with a railing? : Total 6 Click Score: 8    End of Session   Activity Tolerance: Patient limited by fatigue Patient left: in bed;with call bell/phone within reach Nurse Communication: Mobility status PT Visit Diagnosis: Unsteadiness on feet (R26.81);History of falling (Z91.81);Muscle weakness (generalized) (M62.81)    Time: 0350-0938 PT Time Calculation (min) (ACUTE ONLY): 22 min   Charges:   PT Evaluation $PT Eval Low Complexity: 1 Low          Donna Bernard, PT, MPT   Ina Homes 05/25/2022, 3:32 PM

## 2022-05-25 NOTE — H&P (Addendum)
History and Physical    Michele Brewer ASN:[id number] DOB: 1950/12/06 DOA: 05/25/2022  Referring MD/NP/PA:   PCP: Barbette Reichmann, MD   Patient coming from:  The patient is coming from home.    Chief Complaint: fall  HPI: Michele Brewer is a 71 y.o. female with medical history significant of Parkinson's disease, depression, rhabdomyolysis, who presents with fall.  Pt states that she accidentally slipped off the bed, fell on ground last night.  She has generalized weakness, could not get up until this morning when the patient was helped up.  No loss of consciousness.  Patient has mild injury to right hip and right shoulder, causing mild pain, which has resolved.  No headache, neck pain.  Patient does not have chest pain, cough, shortness breath.  No nausea, vomiting, diarrhea or abdominal pain.  Denies symptoms of UTI.  Data reviewed independently and ED Course: pt was found to have CKD 928, negative CT head, x-ray of her right shoulder and right hip was negative for bony fracture.  Temperature normal, blood pressure 137/81, 156/103, 138/78, heart rate 93, RR 18, oxygen saturation 99% on room air.  Patient is placed on MedSurg bed for observation.   EKG: I have personally reviewed.  Seem to be sinus rhythm, QTc 477, low voltage, artificially flaccid due to Parkinson's shaking.  Review of Systems:   General: no fevers, chills, no body weight gain, has fatigue HEENT: no blurry vision, hearing changes or sore throat Respiratory: no dyspnea, coughing, wheezing CV: no chest pain, no palpitations GI: no nausea, vomiting, abdominal pain, diarrhea, constipation GU: no dysuria, burning on urination, increased urinary frequency, hematuria  Ext: no leg edema Neuro: no unilateral weakness, numbness, or tingling, no vision change or hearing loss. Has fall Skin: no rash, no skin tear. MSK: No muscle spasm, no deformity, no limitation of range of movement in spin. Has mild right hip left shoulder  pain. Heme: No easy bruising.  Travel history: No recent long distant travel.   Allergy: No Known Allergies  Past Medical History:  Diagnosis Date   Depression    Parkinson disease (HCC)     Past Surgical History:  Procedure Laterality Date   CESAREAN SECTION      Social History:  reports that she has never smoked. She has never used smokeless tobacco. She reports that she does not currently use alcohol. She reports that she does not use drugs.  Family History:  Family History  Problem Relation Age of Onset   Gout Mother    Heart disease Father      Prior to Admission medications   Medication Sig Start Date End Date Taking? Authorizing Provider  carbidopa-levodopa (SINEMET IR) 25-100 MG tablet Take 1 tablet by mouth in the morning, at noon, and at bedtime. 09/08/19   [provider]  cyanocobalamin (,VITAMIN B-12,) 1000 MCG/ML injection Inject into the muscle. Patient not taking: No sig reported    [provider]  DULoxetine (CYMBALTA) 30 MG capsule Take 1 capsule by mouth in the morning and at bedtime. 09/08/19   [provider]  ergocalciferol (VITAMIN D2) 1.25 MG (50000 UT) capsule Take 1 capsule by mouth once a week. Patient not taking: Reported on 02/03/2021 12/26/19   [provider]  gabapentin (NEURONTIN) 100 MG capsule Take 100 mg by mouth 3 (three) times daily. 09/08/19   [provider]  Multiple Vitamin (MULTIVITAMIN) capsule Take 1 capsule by mouth. Take 1 capsule by mouth every other day Patient not taking: No  sig reported    [provider]  olive oil external oil Take 1 capsule by mouth daily. Patient not taking: No sig reported    [provider]    Physical Exam: Vitals:   05/25/22 1241 05/25/22 1330 05/25/22 1400 05/25/22 1529  BP:  127/75 117/71 95/63  Pulse:  91 69 87  Resp:  18 15 17   Temp: 98.3 F (36.8 C)   98.2 F (36.8 C)  TempSrc: Oral   Oral  SpO2:  100% 98% 99%  Weight:       Height:       General: Not in acute distress HEENT:       Eyes: PERRL, EOMI, no scleral icterus.       ENT: No discharge from the ears and nose, no pharynx injection, no tonsillar enlargement.        Neck: No JVD, no bruit, no mass felt. Heme: No neck lymph node enlargement. Cardiac: S1/S2, RRR, No murmurs, No gallops or rubs. Respiratory: No rales, wheezing, rhonchi or rubs. GI: Soft, nondistended, nontender, no rebound pain, no organomegaly, BS present. GU: No hematuria Ext: No pitting leg edema bilaterally. 1+DP/PT pulse bilaterally. Musculoskeletal: No joint deformities, No joint redness or warmth, no limitation of ROM in spin. Skin: No rashes.  Neuro: Alert, oriented X3, cranial nerves II-XII grossly intact, moves all extremities. Has tremor in both hands  Psych: Patient is not psychotic, no suicidal or hemocidal ideation.  Labs on Admission: I have personally reviewed following labs and imaging studies  CBC: Recent Labs  Lab 05/25/22 0748  WBC 7.1  NEUTROABS 5.9  HGB 12.1  HCT 35.5*  MCV 92.4  PLT 99991111   Basic Metabolic Panel: Recent Labs  Lab 05/25/22 0748  NA 143  K 3.6  CL 107  CO2 28  GLUCOSE 118*  BUN 17  CREATININE 0.63  CALCIUM 9.0   GFR: Estimated Creatinine Clearance: 59.3 mL/min (by C-G formula based on SCr of 0.63 mg/dL). Liver Function Tests: No results for input(s): "AST", "ALT", "ALKPHOS", "BILITOT", "PROT", "ALBUMIN" in the last 168 hours. No results for input(s): "LIPASE", "AMYLASE" in the last 168 hours. No results for input(s): "AMMONIA" in the last 168 hours. Coagulation Profile: No results for input(s): "INR", "PROTIME" in the last 168 hours. Cardiac Enzymes: Recent Labs  Lab 05/25/22 0748  CKTOTAL 928*   BNP (last 3 results) No results for input(s): "PROBNP" in the last 8760 hours. HbA1C: No results for input(s): "HGBA1C" in the last 72 hours. CBG: No results for input(s): "GLUCAP" in the last 168 hours. Lipid Profile: No  results for input(s): "CHOL", "HDL", "LDLCALC", "TRIG", "CHOLHDL", "LDLDIRECT" in the last 72 hours. Thyroid Function Tests: No results for input(s): "TSH", "T4TOTAL", "FREET4", "T3FREE", "THYROIDAB" in the last 72 hours. Anemia Panel: No results for input(s): "VITAMINB12", "FOLATE", "FERRITIN", "TIBC", "IRON", "RETICCTPCT" in the last 72 hours. Urine analysis:    Component Value Date/Time   COLORURINE YELLOW (A) 02/03/2021 1817   APPEARANCEUR CLEAR (A) 02/03/2021 1817   LABSPEC 1.028 02/03/2021 1817   PHURINE 5.0 02/03/2021 1817   GLUCOSEU NEGATIVE 02/03/2021 1817   HGBUR NEGATIVE 02/03/2021 1817   BILIRUBINUR NEGATIVE 02/03/2021 1817   KETONESUR 20 (A) 02/03/2021 1817   PROTEINUR 100 (A) 02/03/2021 1817   NITRITE NEGATIVE 02/03/2021 1817   LEUKOCYTESUR NEGATIVE 02/03/2021 1817   Sepsis Labs: @LABRCNTIP (procalcitonin:4,lacticidven:4) )No results found for this or any previous visit (from the past 240 hour(s)).   Radiological Exams on Admission: DG Shoulder Right  Result Date: 05/25/2022 CLINICAL DATA:  Fall, right shoulder pain EXAM: RIGHT SHOULDER - 2+ VIEW COMPARISON:  Chest radiograph 02/03/2021 FINDINGS: Bony demineralization. Mild glenoid spurring. Cervical spondylosis. Normal glenohumeral alignment. Moderately off axis transscapular view but no gross cortical discontinuity in the scapula is identified. No AC joint malalignment noted. IMPRESSION: 1. No acute fracture identified. 2. Cervical spondylosis. 3. Bony demineralization. Electronically Signed   By: Van Clines M.D.   On: 05/25/2022 08:26   DG Hip Unilat With Pelvis 2-3 Views Right  Result Date: 05/25/2022 CLINICAL DATA:  Fall last night, hip pain EXAM: DG HIP (WITH OR WITHOUT PELVIS) 2-3V RIGHT COMPARISON:  None Available. FINDINGS: Bony demineralization. Mild spurring of the femoral heads and acetabula, left greater than right. Lower lumbar spondylosis. No well-defined cortical discontinuity to indicate fracture of  the right proximal femur or adjacent bony pelvis. IMPRESSION: 1. No fracture or acute bony findings identified radiographically. 2. Mild degenerative arthropathy of both hips. 3. Lower lumbar spondylosis. 4. Bony demineralization. Electronically Signed   By: Van Clines M.D.   On: 05/25/2022 08:24   CT Head Wo Contrast  Result Date: 05/25/2022 CLINICAL DATA:  Fall EXAM: CT HEAD WITHOUT CONTRAST TECHNIQUE: Contiguous axial images were obtained from the base of the skull through the vertex without intravenous contrast. RADIATION DOSE REDUCTION: This exam was performed according to the departmental dose-optimization program which includes automated exposure control, adjustment of the mA and/or kV according to patient size and/or use of iterative reconstruction technique. COMPARISON:  CT head 02/03/2021 FINDINGS: Brain: There is no acute intracranial hemorrhage, extra-axial fluid collection, or acute infarct. Parenchymal volume is normal. The ventricles are normal in size. Gray-white differentiation is preserved. There is no mass lesion.  There is no mass effect or midline shift. Vascular: No hyperdense vessel or unexpected calcification. Skull: Normal. Negative for fracture or focal lesion. Sinuses/Orbits: The paranasal sinuses are clear. The globes and orbits are unremarkable. Other: None. IMPRESSION: No acute intracranial pathology. Electronically Signed   By: Valetta Mole M.D.   On: 05/25/2022 08:08      Assessment/Plan Principal Problem:   Rhabdomyolysis Active Problems:   Fall   Parkinson disease [facility])   Depression   Assessment and Plan: * Rhabdomyolysis CK is 928. GFR >60.  -place in med-surg bed for obs -IVF: 1L of NSm then 100 cc/h -repeat CK in AM  Fall CT-head negative. -PT/TO -fall precaution.   Parkinson disease ([place]) - Continue Sinemet  Depression -Continue Cymbalta          DVT ppx: SQ Lovenox  Code Status:  DNR (I discussed with patient and explained  the meaning of CODE STATUS. Patient wants to be DNR)  Family Communication: I offered to call her family, but patient states that she can call her family by herself   Disposition Plan:  Anticipate discharge back to previous environment  Consults called:  none  Admission status and Level of care: Med-Surg:    for obs    Dispo: The patient is from: Home              Anticipated d/c is to: Home              Anticipated d/c date is: 1 day              Patient currently is not medically stable to d/c.    Severity of Illness:  The appropriate patient status for this patient is OBSERVATION. Observation status is judged to be reasonable and  necessary in order to provide the required intensity of service to ensure the patient's safety. The patient's presenting symptoms, physical exam findings, and initial radiographic and laboratory data in the context of their medical condition is felt to place them at decreased risk for further clinical deterioration. Furthermore, it is anticipated that the patient will be medically stable for discharge from the hospital within 2 midnights of admission.        Date of Service 05/25/2022    Lorretta Harp [facility]   If 7PM-7AM, please contact night-coverage [email or web address] 05/25/2022, 6:08 PM

## 2022-05-25 NOTE — ED Provider Notes (Addendum)
[facility] Provider Note    Event Date/Time   First MD Initiated Contact with Patient 05/25/22 [phone]     (approximate)   History   Fall and Hip Pain   HPI  Michele Brewer is a 71 y.o. female   Past medical history of history of Parkinson's disease, ambulatory with a cane at baseline, lives at home with 3 roommates who presents with a fall.  She slipped off of her bed last night and was on the ground unable to get up.  She denies head strike or loss of consciousness, no blood thinners.  She injured her right shoulder and right hip.  She was found this morning on the ground, and brought to the emergency department by EMS.  She otherwise has been in her regular state of health, no recent illnesses, no other complaints.    No presyncope leading to the event.    History was obtained via the patient and review of external medical notes.      Physical Exam   Triage Vital Signs: ED Triage Vitals  Enc Vitals Group     BP      Pulse      Resp      Temp      Temp src      SpO2      Weight      Height      Head Circumference      [facility] Flow      Pain Score      Pain Loc      Pain Edu?      Excl. in GC?     Most recent vital signs: Vitals:   05/25/22 0741 05/25/22 0748  BP: 137/81   Pulse: 93   Resp: 18   Temp:  98.1 F (36.7 C)  SpO2: 99%     General: Awake, no distress.  CV:  Good peripheral perfusion.  Resp:  Normal effort.  Abd:  No distention.  Other:  Coarse tremors, at baseline per patient.  No signs of head injuries, no C, T, L-spine tenderness.  No signs of thoracic or abdominal injuries, tenderness.  She is contracted at baseline.  Vascular intact in all 4 extremities.  Some tenderness to ranging of the right shoulder and right hip.  No shortening.  ED Results / Procedures / Treatments   Labs (all labs ordered are listed, but only abnormal results are displayed) Labs Reviewed  BASIC METABOLIC PANEL - Abnormal; Notable for the  following components:      Result Value   Glucose, Bld 118 (*)    All other components within normal limits  CBC WITH DIFFERENTIAL/PLATELET - Abnormal; Notable for the following components:   RBC 3.84 (*)    HCT 35.5 (*)    Lymphs Abs 0.3 (*)    All other components within normal limits  CK - Abnormal; Notable for the following components:   Total CK 928 (*)    All other components within normal limits  URINALYSIS, ROUTINE W REFLEX MICROSCOPIC     I reviewed labs and they are notable for CK 900s  RADIOLOGY I independently reviewed and interpreted hip XR R sided and see no obvious fractures or dislocations   PROCEDURES:  Critical Care performed: No  Procedures   MEDICATIONS ORDERED IN ED: Medications  acetaminophen (TYLENOL) tablet 650 mg (650 mg Oral Given 05/25/22 0831)  sodium chloride 0.9 % bolus 1,000 mL (1,000 mLs Intravenous New Bag/Given 05/25/22 0851)  Consultants:  I spoke with hospitalist re; Admission and regarding care plan for this patient.   IMPRESSION / MDM / ASSESSMENT AND PLAN / ED COURSE  I reviewed the triage vital signs and the nursing notes.                              Differential diagnosis includes, but is not limited to, mechanical slip and fall on the ground for prolonged period of time.  Check basic labs for kidney injury and rhabdo.  Traumatic work-up differential diagnosis includes intracranial injuries, less likely blunt thoracic/ intra-abdominal complaints of pain and no signs of injury on my physical exam, consider extremity dislocations or fractures, particular concern of right shoulder and right hip where the patient is reporting pain.  MDM: Differential diagnosis as above, getting basic labs, CK, imaging of the head, right shoulder, right hip and pelvis.  CK 900+ and no traumatic injuries on imaging.  Able to range at hip bilaterally, though bilateral weakness; doubt occult fx given neg XR and ability to range.   Dispo: After careful  consideration of this patient's presentation, medical and social risk factors, and evaluation in the emergency department I engaged in shared decision making with the patient and/or their representative to consider admission or observation and this patient was ultimately admitted because comorbidities, weakness, elevated CK concern for rhabdo..   Patient's presentation is most consistent with acute presentation with potential threat to life or bodily function.       FINAL CLINICAL IMPRESSION(S) / ED DIAGNOSES   Final diagnoses:  Non-traumatic rhabdomyolysis  Fall, initial encounter  Generalized weakness     Rx / DC Orders   ED Discharge Orders     None        Note:  This document was prepared using Dragon voice recognition software and may include unintentional dictation errors.    Pilar Jarvis, MD 05/25/22 9458    Pilar Jarvis, MD 05/25/22 [phone]

## 2022-05-26 DIAGNOSIS — F32A Depression, unspecified: Secondary | ICD-10-CM | POA: Diagnosis present

## 2022-05-26 DIAGNOSIS — T796XXA Traumatic ischemia of muscle, initial encounter: Secondary | ICD-10-CM | POA: Diagnosis present

## 2022-05-26 DIAGNOSIS — W06XXXA Fall from bed, initial encounter: Secondary | ICD-10-CM | POA: Diagnosis present

## 2022-05-26 DIAGNOSIS — Z66 Do not resuscitate: Secondary | ICD-10-CM | POA: Diagnosis present

## 2022-05-26 DIAGNOSIS — M25551 Pain in right hip: Secondary | ICD-10-CM | POA: Diagnosis present

## 2022-05-26 DIAGNOSIS — Y92003 Bedroom of unspecified non-institutional (private) residence as the place of occurrence of the external cause: Secondary | ICD-10-CM | POA: Diagnosis not present

## 2022-05-26 DIAGNOSIS — Z79899 Other long term (current) drug therapy: Secondary | ICD-10-CM | POA: Diagnosis not present

## 2022-05-26 DIAGNOSIS — W19XXXA Unspecified fall, initial encounter: Secondary | ICD-10-CM | POA: Diagnosis not present

## 2022-05-26 DIAGNOSIS — G20 Parkinson's disease: Secondary | ICD-10-CM | POA: Diagnosis present

## 2022-05-26 DIAGNOSIS — R531 Weakness: Secondary | ICD-10-CM | POA: Diagnosis present

## 2022-05-26 LAB — BASIC METABOLIC PANEL
Anion gap: 5 (ref 5–15)
BUN: 15 mg/dL (ref 8–23)
CO2: 26 mmol/L (ref 22–32)
Calcium: 8.6 mg/dL — ABNORMAL LOW (ref 8.9–10.3)
Chloride: 110 mmol/L (ref 98–111)
Creatinine, Ser: 0.63 mg/dL (ref 0.44–1.00)
GFR, Estimated: >60 mL/min (ref >60)
Glucose, Bld: 87 mg/dL (ref 70–99)
Potassium: 3.6 mmol/L (ref 3.5–5.1)
Sodium: 141 mmol/L (ref 135–145)

## 2022-05-26 LAB — CK: Total CK: 4450 U/L — ABNORMAL HIGH (ref 38–234)

## 2022-05-26 LAB — MAGNESIUM: Magnesium: 1.9 mg/dL (ref 1.7–2.4)

## 2022-05-26 NOTE — Plan of Care (Signed)

## 2022-05-26 NOTE — Progress Notes (Signed)
  Progress Note   Patient: Michele Brewer YOV:[id number] DOB: January 05, 1951 DOA: 05/25/2022     0 DOS: the patient was seen and examined on 05/26/2022   Brief hospital course: Michele Brewer is a 71 y.o. female with medical history significant of Parkinson's disease, depression, rhabdomyolysis, who presents with fall.  She could not get up after the fall, she was on the floor overnight.  By time she came to the hospital, her CPK level was elevated at 928, received fluids, CK level increased to 4450 the next morning. Patient is diagnosed with traumatic rhabdomyolysis, continued on IV fluids.  Assessment and Plan: Traumatic rhabdomyolysis. Fall. Patient had a fall at home, she did not have syncope.  She does not take a statin.  Her CK level went up today.  We will continue IV fluids for rehydration.  Renal function still within normal limits. Continue PT/OT.  Patient may need nursing home placement.  Parkinson disease. Continue Sinemet.  Depression. Continue Cymbalta.     Subjective:  Patient has signal weakness, tremor, otherwise no complaints.  Physical Exam: Vitals:   05/25/22 1529 05/25/22 2013 05/25/22 2349 05/26/22 0810  BP: 95/63 125/69 118/65 128/67  Pulse: 87 99 64 (!) 57  Resp: 17 17 17 17   Temp: 98.2 F (36.8 C) 98.5 F (36.9 C)  98 F (36.7 C)  TempSrc: Oral   Oral  SpO2: 99% 100% 99% 99%  Weight:      Height:       General exam: Appears calm and comfortable  Respiratory system: Clear to auscultation. Respiratory effort normal. Cardiovascular system: S1 & S2 heard, RRR. No JVD, murmurs, rubs, gallops or clicks. No pedal edema. Gastrointestinal system: Abdomen is nondistended, soft and nontender. No organomegaly or masses felt. Normal bowel sounds heard. Central nervous system: Alert and oriented x3. No focal neurological deficits. Extremities: Hand tremor. Skin: No rashes, lesions or ulcers Psychiatry: Judgement and insight appear normal. Mood & affect appropriate.  ' Data Reviewed:  Lab results reviewed.  Family Communication: Does not have a family, listed friend as contact person.  Disposition: Status is: Inpatient Remains inpatient appropriate because: Severity of disease, IV treatment.  Planned Discharge Destination: Skilled nursing facility    Time spent: 35 minutes  Author: , MD 05/26/2022 1:19 PM  For on call review [email or web address].

## 2022-05-26 NOTE — NC FL2 (Signed)
  [place] MEDICAID FL2 LEVEL OF CARE SCREENING TOOL     IDENTIFICATION  Patient Name: Michele Brewer Birthdate: 25-Feb-1951 Sex: female Admission Date (Current Location): 05/25/2022  [facility] and [name] Number:  Chiropodist and Address:  [facility], [address], [place], [place] [address]      Provider Number: [id number]  Attending Physician Name and Address:  Marrion Coy, MD  Relative Name and Phone Number:  Marina Goodell, friend [phone]    Current Level of Care: Hospital Recommended Level of Care: Skilled Nursing Facility Prior Approval Number:    Date Approved/Denied:   PASRR Number: [id number]  Discharge Plan: SNF    Current Diagnoses: Patient Active Problem List   Diagnosis Date Noted   Parkinson disease (HCC) 05/25/2022   Depression 05/25/2022   Rhabdomyolysis 02/04/2021   Fall 02/03/2021    Orientation RESPIRATION BLADDER Height & Weight     Self, Time, Situation, Place  Normal Continent Weight: 65.8 kg Height:  5\' 4"  (162.6 cm)  BEHAVIORAL SYMPTOMS/MOOD NEUROLOGICAL BOWEL NUTRITION STATUS     (parkinsons) Continent Diet (see dc summary)  AMBULATORY STATUS COMMUNICATION OF NEEDS Skin   Extensive Assist Verbally Normal                       Personal Care Assistance Level of Assistance  Bathing, Feeding, Dressing Bathing Assistance: Limited assistance Feeding assistance: Independent Dressing Assistance: Maximum assistance     Functional Limitations Info             SPECIAL CARE FACTORS FREQUENCY  PT (By licensed PT), OT (By licensed OT)     PT Frequency: 5 times per week OT Frequency: 5 times per week            Contractures Contractures Info: Not present    Additional Factors Info  Code Status, Allergies Code Status Info: DNR Allergies Info: NKDA           Current Medications (05/26/2022):  This is the current hospital active medication list Current Facility-Administered Medications   Medication Dose Route Frequency Provider Last Rate Last Admin   0.9 %  sodium chloride infusion   Intravenous Continuous 07/26/2022, MD 100 mL/hr at 05/25/22 2215 New Bag at 05/25/22 2215   acetaminophen (TYLENOL) tablet 650 mg  650 mg Oral Q6H PRN 2216, MD   650 mg at 05/25/22 2210   carbidopa-levodopa (SINEMET IR) 25-100 MG per tablet immediate release 1 tablet  1 tablet Oral TID 07/25/22, MD   1 tablet at 05/26/22 0914   DULoxetine (CYMBALTA) DR capsule 30 mg  30 mg Oral Daily 07/26/22, MD   30 mg at 05/26/22 0914   enoxaparin (LOVENOX) injection 40 mg  40 mg Subcutaneous Q24H 07/26/22, MD   40 mg at 05/26/22 0915   gabapentin (NEURONTIN) capsule 100 mg  100 mg Oral TID 07/26/22, MD   100 mg at 05/26/22 0914   ondansetron (ZOFRAN) injection 4 mg  4 mg Intravenous Q8H PRN 07/26/22, MD         Discharge Medications: Please see discharge summary for a list of discharge medications.  Relevant Imaging Results:  Relevant Lab Results:   Additional Information SS #: [id number]  Eidan Muellner Lorretta Harp, RN

## 2022-05-26 NOTE — TOC Progression Note (Signed)
Transition of Care [facility]) - Progression Note    Patient Details  Name: Michele Brewer MRN: [id number] Date of Birth: 10-19-1950  Transition of Care [facility]) CM/SW Contact  Marlowe Sax, RN Phone Number: 05/26/2022, 9:21 AM  Clinical Narrative:    Spoke to the patient and gained approval to do a bedsearch she would like to go to [facility], her sister is there currently, I explained I would reach out to see if they have a bed, I also explained we would have to have Ins approval Bedsearch sent, FL2 completed, PASSR obtained       Expected Discharge Plan and Services                                                 Social Determinants of Health (SDOH) Interventions    Readmission Risk Interventions     No data to display

## 2022-05-26 NOTE — Hospital Course (Signed)
Michele Brewer is a 71 y.o. female with medical history significant of Parkinson's disease, depression, rhabdomyolysis, who presents with fall.  She could not get up after the fall, she was on the floor overnight.  By time she came to the hospital, her CPK level was elevated at 928, received fluids, CK level increased to 4450 the next morning. Patient is diagnosed with traumatic rhabdomyolysis, continued on IV fluids.

## 2022-05-26 NOTE — TOC Progression Note (Signed)
Transition of Care [facility]) - Progression Note    Patient Details  Name: Michele Brewer MRN: [id number] Date of Birth: 12/18/1950  Transition [facility] Care [facility]) CM/SW Contact  Marlowe Sax, RN Phone Number: 05/26/2022, 2:01 PM  Clinical Narrative:     Reviewed the bed offers with the patient and she chose [facility], I called [employer] and requested Ins approval to go to [facility] as well as EMS approval  Spoke with Judeth Cornfield and provided the needed information      Expected Discharge Plan and Services                                                 Social Determinants of Health (SDOH) Interventions    Readmission Risk Interventions     No data to display

## 2022-05-27 DIAGNOSIS — G20 Parkinson's disease: Secondary | ICD-10-CM | POA: Diagnosis not present

## 2022-05-27 DIAGNOSIS — T796XXA Traumatic ischemia of muscle, initial encounter: Secondary | ICD-10-CM | POA: Diagnosis not present

## 2022-05-27 LAB — BASIC METABOLIC PANEL
Anion gap: 8 (ref 5–15)
BUN: 15 mg/dL (ref 8–23)
CO2: 24 mmol/L (ref 22–32)
Calcium: 8.6 mg/dL — ABNORMAL LOW (ref 8.9–10.3)
Chloride: 107 mmol/L (ref 98–111)
Creatinine, Ser: 0.55 mg/dL (ref 0.44–1.00)
GFR, Estimated: >60 mL/min (ref >60)
Glucose, Bld: 72 mg/dL (ref 70–99)
Potassium: 3.6 mmol/L (ref 3.5–5.1)
Sodium: 139 mmol/L (ref 135–145)

## 2022-05-27 LAB — CK: Total CK: 2857 U/L — ABNORMAL HIGH (ref 38–234)

## 2022-05-27 LAB — MAGNESIUM: Magnesium: 1.7 mg/dL (ref 1.7–2.4)

## 2022-05-27 NOTE — Plan of Care (Signed)

## 2022-05-27 NOTE — TOC Progression Note (Signed)
Transition of Care [facility])) - Progression Note    Patient Details  Name: Michele Brewer MRN: [id number] Date of Birth: 06/25/1951  Transition of Care [facility]) CM/SW Contact  Liliana Cline, LCSW Phone Number: 05/27/2022, 9:24 AM  Clinical Narrative:    Patient not medically ready for SNF today per MD. Notified Tammy at [facility].         Expected Discharge Plan and Services                                                 Social Determinants of Health (SDOH) Interventions    Readmission Risk Interventions     No data to display

## 2022-05-27 NOTE — Progress Notes (Signed)
  Progress Note   Patient: Michele Brewer EXB:[id number] DOB: 1950-11-09 DOA: 05/25/2022     1 DOS: the patient was seen and examined on 05/27/2022   Brief hospital course: Michele Brewer is a 71 y.o. female with medical history significant of Parkinson's disease, depression, rhabdomyolysis, who presents with fall.  She could not get up after the fall, she was on the floor overnight.  By time she came to the hospital, her CPK level was elevated at 928, received fluids, CK level increased to 4450 the next morning. Patient is diagnosed with traumatic rhabdomyolysis, continued on IV fluids.  Assessment and Plan: Traumatic rhabdomyolysis. Fall. Patient had a fall at home, she did not have syncope.  She does not take a statin.   CK level improving.  Currently patient has no evidence of volume overload, continue IV fluids.  Renal function still normal. Patient feels weaker today, discussed with RN, patient need to get up and walk with RN.   Parkinson disease. Continue Sinemet.   Depression. Continue Cymbalta.      Subjective:  Patient doing well today, slept well, denies any short of breath or cough.  Physical Exam: Vitals:   05/26/22 0810 05/26/22 1556 05/27/22 0012 05/27/22 0815  BP: 128/67 121/71 130/71 129/74  Pulse: (!) 57 64 (!) 57 64  Resp: 17  16 17   Temp: 98 F (36.7 C) 97.8 F (36.6 C) (!) 97.3 F (36.3 C) 97.9 F (36.6 C)  TempSrc: Oral     SpO2: 99% 99%  100%  Weight:      Height:       General exam: Appears calm and comfortable  Respiratory system: Clear to auscultation. Respiratory effort normal. Cardiovascular system: S1 & S2 heard, RRR. No JVD, murmurs, rubs, gallops or clicks. No pedal edema. Gastrointestinal system: Abdomen is nondistended, soft and nontender. No organomegaly or masses felt. Normal bowel sounds heard. Central nervous system: Alert and oriented. No focal neurological deficits. Extremities: Symmetric 5 x 5 power. Skin: No rashes, lesions or  ulcers Psychiatry: Judgement and insight appear normal. Mood & affect appropriate.   Data Reviewed:  Lab results reviewed.  Family Communication:   Disposition: Status is: Inpatient Remains inpatient appropriate because: Severity of disease, IV fluids.  Planned Discharge Destination: Skilled nursing facility    Time spent: 35 minutes  Author: , MD 05/27/2022 12:19 PM  For on call review [email or web address].

## 2022-05-28 DIAGNOSIS — G20 Parkinson's disease: Secondary | ICD-10-CM | POA: Diagnosis not present

## 2022-05-28 DIAGNOSIS — T796XXA Traumatic ischemia of muscle, initial encounter: Secondary | ICD-10-CM | POA: Diagnosis not present

## 2022-05-28 LAB — BASIC METABOLIC PANEL
Anion gap: 2 — ABNORMAL LOW (ref 5–15)
BUN: 15 mg/dL (ref 8–23)
CO2: 28 mmol/L (ref 22–32)
Calcium: 8.1 mg/dL — ABNORMAL LOW (ref 8.9–10.3)
Chloride: 110 mmol/L (ref 98–111)
Creatinine, Ser: 0.56 mg/dL (ref 0.44–1.00)
GFR, Estimated: >60 mL/min (ref >60)
Glucose, Bld: 87 mg/dL (ref 70–99)
Potassium: 4.3 mmol/L (ref 3.5–5.1)
Sodium: 140 mmol/L (ref 135–145)

## 2022-05-28 LAB — MAGNESIUM: Magnesium: 1.6 mg/dL — ABNORMAL LOW (ref 1.7–2.4)

## 2022-05-28 LAB — CK: Total CK: 1691 U/L — ABNORMAL HIGH (ref 38–234)

## 2022-05-28 MED ORDER — MAGNESIUM SULFATE 2 GM/50ML IV SOLN
2.0000 g | Freq: Once | INTRAVENOUS | Status: AC
  Administered 2022-05-28: 2 g via INTRAVENOUS
  Filled 2022-05-28: qty 50

## 2022-05-28 MED ORDER — BUTALBITAL-APAP-CAFFEINE 50-325-40 MG PO TABS
1.0000 | ORAL_TABLET | Freq: Two times a day (BID) | ORAL | Status: DC | PRN
  Administered 2022-05-28: 1 via ORAL
  Filled 2022-05-28 (×2): qty 1

## 2022-05-28 MED ORDER — BUTALBITAL-APAP-CAFFEINE 50-325-40 MG PO TABS
1.0000 | ORAL_TABLET | Freq: Two times a day (BID) | ORAL | Status: DC
Start: 2022-05-28 — End: 2022-05-28

## 2022-05-28 NOTE — Progress Notes (Signed)
  Progress Note   Patient: Michele Brewer WVP:[id number] DOB: 03/22/1951 DOA: 05/25/2022     2 DOS: the patient was seen and examined on 05/28/2022   Brief hospital course: Lilliahna Schubring is a 71 y.o. female with medical history significant of Parkinson's disease, depression, rhabdomyolysis, who presents with fall.  She could not get up after the fall, she was on the floor overnight.  By time she came to the hospital, her CPK level was elevated at 928, received fluids, CK level increased to 4450 the next morning. Patient is diagnosed with traumatic rhabdomyolysis, continued on IV fluids.  Assessment and Plan: Traumatic rhabdomyolysis. Fall. Patient had a fall at home, she did not have syncope.  She does not take a statin.   CK continues to improve, patient has no volume overload.  No short of breath.  Continue IV fluids.     Parkinson disease. Continue Sinemet.   Depression. Continue Cymbalta.        Subjective:  Doing well today, denies any short of breath or cough.  No diarrhea.  Physical Exam: Vitals:   05/27/22 0815 05/27/22 1717 05/28/22 0042 05/28/22 0938  BP: 129/74 125/66 126/66 124/74  Pulse: 64 73 (!) 58 73  Resp: 17 16 16    Temp: 97.9 F (36.6 C) 98 F (36.7 C) (!) 97.4 F (36.3 C) 98 F (36.7 C)  TempSrc:   Oral   SpO2: 100% 100% 100% 100%  Weight:      Height:       General exam: Appears calm and comfortable  Respiratory system: Clear to auscultation. Respiratory effort normal. Cardiovascular system: S1 & S2 heard, RRR. No JVD, murmurs, rubs, gallops or clicks. No pedal edema. Gastrointestinal system: Abdomen is nondistended, soft and nontender. No organomegaly or masses felt. Normal bowel sounds heard. Central nervous system: Alert and oriented. No focal neurological deficits. Extremities: Symmetric 5 x 5 power. Skin: No rashes, lesions or ulcers Psychiatry: Judgement and insight appear normal. Mood & affect appropriate.   Data Reviewed:  Lab results  reviewed.  Family Communication:   Disposition: Status is: Inpatient Remains inpatient appropriate because: Severity of disease.  IV fluids.  Planned Discharge Destination: Skilled nursing facility    Time spent: 30 minutes  Author: , MD 05/28/2022 1:19 PM  For on call review [email or web address].

## 2022-05-28 NOTE — Progress Notes (Signed)
Physical Therapy Treatment Patient Details Name: Michele Brewer MRN: [id number] DOB: 1951-07-17 Today's Date: 05/28/2022   History of Present Illness Patient is a 71 year old female with who presents after a fall from the bed where she was unable to get up from the ground.  Head CT negative for acute findings. Imaging of shoulder and hip with no fractures reported.  Medical history significant of Parkinson's disease, depression, rhabdomyolysis    PT Comments    Pt in chair, feeling better.  Stated she is walking to/from bathroom with tech.  Stood with min a a x 1 after min assist to scoot to edge of chair.  She is able to stand for standing ex x 10 with walker support.  Short seated rest before walking 140' in hallway with slow step through gait with decreased step height and length.  After sitting in chair, stands again to walk to/from bathroom to void.    Pt progressing well with mobility but is not at baseline.  Gait remains unsteady and unsafe to walk with +1 assist at this time.  Sister with recent fall at [facility] for rehab and mother is 100 home alone with mobility issues and unable to provide support.    SNF remain appropriate prior to hopeful transition back home with her mother and sister.   Recommendations for follow up therapy are one component of a multi-disciplinary discharge planning process, led by the attending physician.  Recommendations may be updated based on patient status, additional functional criteria and insurance authorization.  Follow Up Recommendations  Skilled nursing-short term rehab (<3 hours/day)     Assistance Recommended at Discharge Frequent or constant Supervision/Assistance  Patient can return home with the following A little help with walking and/or transfers;A little help with bathing/dressing/bathroom;Assistance with cooking/housework;Assist for transportation;Help with stairs or ramp for entrance   Equipment Recommendations       Recommendations for Other  Services       Precautions / Restrictions Precautions Precautions: Fall Restrictions Weight Bearing Restrictions: No     Mobility  Bed Mobility               General bed mobility comments: in chair before and after session    Transfers Overall transfer level: Needs assistance Equipment used: Rolling walker (2 wheels) Transfers: Sit to/from Stand   Stand pivot transfers: Min assist              Ambulation/Gait Ambulation/Gait assistance: Min assist Gait Distance (Feet): 130 Feet Assistive device: Rolling walker (2 wheels) Gait Pattern/deviations: Step-through pattern, Decreased step length - right, Decreased step length - left, Shuffle, Trunk flexed Gait velocity: decreased     General Gait Details: able to walk 150' in hallway then after return to chair, to bathroom and back to void.   Stairs             Wheelchair Mobility    Modified Rankin (Stroke Patients Only)       Balance Overall balance assessment: Needs assistance, History of Falls Sitting-balance support: Feet supported Sitting balance-Leahy Scale: Fair     Standing balance support: Bilateral upper extremity supported Standing balance-Leahy Scale: Fair Standing balance comment: hand on assist needed but overall does fair.  no lob or bucking but anticipate poor recovery if she does have LOB                            Cognition Arousal/Alertness: Awake/alert Behavior During Therapy: WFL for tasks  assessed/performed Overall Cognitive Status: Within Functional Limits for tasks assessed                                          Exercises Other Exercises Other Exercises: to bathroom to void    General Comments        Pertinent Vitals/Pain Pain Assessment Pain Assessment: Faces Faces Pain Scale: Hurts a little bit Pain Location: right side, generalized Pain Descriptors / Indicators: Discomfort, Sore Pain Intervention(s): Monitored during  session, Repositioned    Home Living                          Prior Function            PT Goals (current goals can now be found in the care plan section) Progress towards PT goals: Progressing toward goals    Frequency    Min 2X/week      PT Plan Current plan remains appropriate    Co-evaluation              AM-PAC PT "6 Clicks" Mobility   Outcome Measure  Help needed turning from your back to your side while in a flat bed without using bedrails?: A Little Help needed moving from lying on your back to sitting on the side of a flat bed without using bedrails?: A Little Help needed moving to and from a bed to a chair (including a wheelchair)?: A Little Help needed standing up from a chair using your arms (e.g., wheelchair or bedside chair)?: A Little Help needed to walk in hospital room?: A Little Help needed climbing 3-5 steps with a railing? : A Lot 6 Click Score: 17    End of Session Equipment Utilized During Treatment: Gait belt Activity Tolerance: Patient tolerated treatment well Patient left: in chair;with call bell/phone within reach;with chair alarm set Nurse Communication: Mobility status PT Visit Diagnosis: Unsteadiness on feet (R26.81);History of falling (Z91.81);Muscle weakness (generalized) (M62.81)     Time: 9326-7124 PT Time Calculation (min) (ACUTE ONLY): 34 min  Charges:  $Gait Training: 23-37 mins                   Danielle Dess, PTA 05/28/22, 3:56 PM

## 2022-05-29 DIAGNOSIS — M6282 Rhabdomyolysis: Secondary | ICD-10-CM | POA: Diagnosis not present

## 2022-05-29 DIAGNOSIS — F32A Depression, unspecified: Secondary | ICD-10-CM | POA: Diagnosis not present

## 2022-05-29 DIAGNOSIS — G20 Parkinson's disease: Secondary | ICD-10-CM | POA: Diagnosis not present

## 2022-05-29 DIAGNOSIS — E119 Type 2 diabetes mellitus without complications: Secondary | ICD-10-CM | POA: Diagnosis not present

## 2022-05-29 DIAGNOSIS — Z736 Limitation of activities due to disability: Secondary | ICD-10-CM | POA: Diagnosis not present

## 2022-05-29 DIAGNOSIS — T796XXS Traumatic ischemia of muscle, sequela: Secondary | ICD-10-CM | POA: Diagnosis not present

## 2022-05-29 DIAGNOSIS — Z79899 Other long term (current) drug therapy: Secondary | ICD-10-CM | POA: Diagnosis not present

## 2022-05-29 DIAGNOSIS — M6281 Muscle weakness (generalized): Secondary | ICD-10-CM | POA: Diagnosis not present

## 2022-05-29 DIAGNOSIS — R488 Other symbolic dysfunctions: Secondary | ICD-10-CM | POA: Diagnosis not present

## 2022-05-29 DIAGNOSIS — R2681 Unsteadiness on feet: Secondary | ICD-10-CM | POA: Diagnosis not present

## 2022-05-29 DIAGNOSIS — W19XXXD Unspecified fall, subsequent encounter: Secondary | ICD-10-CM | POA: Diagnosis not present

## 2022-05-29 DIAGNOSIS — D72819 Decreased white blood cell count, unspecified: Secondary | ICD-10-CM | POA: Diagnosis not present

## 2022-05-29 DIAGNOSIS — R600 Localized edema: Secondary | ICD-10-CM | POA: Diagnosis not present

## 2022-05-29 DIAGNOSIS — R498 Other voice and resonance disorders: Secondary | ICD-10-CM | POA: Diagnosis not present

## 2022-05-29 DIAGNOSIS — I10 Essential (primary) hypertension: Secondary | ICD-10-CM | POA: Diagnosis not present

## 2022-05-29 DIAGNOSIS — M792 Neuralgia and neuritis, unspecified: Secondary | ICD-10-CM | POA: Diagnosis not present

## 2022-05-29 DIAGNOSIS — W19XXXA Unspecified fall, initial encounter: Secondary | ICD-10-CM | POA: Diagnosis not present

## 2022-05-29 DIAGNOSIS — R12 Heartburn: Secondary | ICD-10-CM | POA: Diagnosis not present

## 2022-05-29 DIAGNOSIS — T796XXA Traumatic ischemia of muscle, initial encounter: Secondary | ICD-10-CM | POA: Diagnosis not present

## 2022-05-29 DIAGNOSIS — M6259 Muscle wasting and atrophy, not elsewhere classified, multiple sites: Secondary | ICD-10-CM | POA: Diagnosis not present

## 2022-05-29 DIAGNOSIS — M623 Immobility syndrome (paraplegic): Secondary | ICD-10-CM | POA: Diagnosis not present

## 2022-05-29 DIAGNOSIS — Z7401 Bed confinement status: Secondary | ICD-10-CM | POA: Diagnosis not present

## 2022-05-29 LAB — BASIC METABOLIC PANEL
Anion gap: 5 (ref 5–15)
BUN: 12 mg/dL (ref 8–23)
CO2: 26 mmol/L (ref 22–32)
Calcium: 8.6 mg/dL — ABNORMAL LOW (ref 8.9–10.3)
Chloride: 111 mmol/L (ref 98–111)
Creatinine, Ser: 0.58 mg/dL (ref 0.44–1.00)
GFR, Estimated: >60 mL/min (ref >60)
Glucose, Bld: 85 mg/dL (ref 70–99)
Potassium: 3.7 mmol/L (ref 3.5–5.1)
Sodium: 142 mmol/L (ref 135–145)

## 2022-05-29 LAB — CK: Total CK: 892 U/L — ABNORMAL HIGH (ref 38–234)

## 2022-05-29 LAB — MAGNESIUM: Magnesium: 2 mg/dL (ref 1.7–2.4)

## 2022-05-29 NOTE — TOC Progression Note (Signed)
Transition of Care [facility]) - Progression Note    Patient Details  Name: Michele Brewer MRN: [id number] Date of Birth: 05-15-1951  [facility] CM/SW Contact  Marlowe Sax, RN Phone Number: 05/29/2022, 9:52 AM  Clinical Narrative:    [facility] Approved the auth to go to [facility] today,  Auth number [id number] EMS approved Auth number auth number [id number] Tammky confirmed can go to [facility] today       Expected Discharge Plan and Services                                                 Social Determinants of Health (SDOH) Interventions    Readmission Risk Interventions     No data to display

## 2022-05-29 NOTE — Progress Notes (Signed)
Occupational Therapy Treatment Patient Details Name: Michele Brewer MRN: [id number] DOB: 05-Apr-1951 Today's Date: 05/29/2022   History of present illness Patient is a 71 year old female with who presents after a fall from the bed where she was unable to get up from the ground.  Head CT negative for acute findings. Imaging of shoulder and hip with no fractures reported.  Medical history significant of Parkinson's disease, depression, rhabdomyolysis   OT comments  Pt seen for OT tx. Pt seated EOB with nursing upon OT's arrival. Pt required cues for anterior weight shift prior to lift off to improve technique. VC for hand placement to push up from the EOB with good carryover, resulting in need for MIN-MOD A to stand from EOB then MIN A for a few steps and to sit in the recliner. Pt demonstrating progress towards goals, requiring less assist for ADL mobility this date.     Recommendations for follow up therapy are one component of a multi-disciplinary discharge planning process, led by the attending physician.  Recommendations may be updated based on patient status, additional functional criteria and insurance authorization.    Follow Up Recommendations  Skilled nursing-short term rehab (<3 hours/day)    Assistance Recommended at Discharge Frequent or constant Supervision/Assistance  Patient can return home with the following  A lot of help with walking and/or transfers;A lot of help with bathing/dressing/bathroom   Equipment Recommendations  Other (comment) (per next venue)    Recommendations for Other Services      Precautions / Restrictions Precautions Precautions: Fall Restrictions Weight Bearing Restrictions: No       Mobility Bed Mobility               General bed mobility comments: pt seated EOB with nursing upon OT's arrival    Transfers Overall transfer level: Needs assistance Equipment used: Rolling walker (2 wheels) Transfers: Sit to/from Stand, Bed to  chair/wheelchair/BSC Sit to Stand: Min assist, Mod assist     Step pivot transfers: Mod assist, Min assist     General transfer comment: VC for sequencing with RW     Balance Overall balance assessment: Needs assistance, History of Falls Sitting-balance support: Feet supported, Single extremity supported Sitting balance-Leahy Scale: Fair     Standing balance support: Bilateral upper extremity supported Standing balance-Leahy Scale: Fair                             ADL either performed or assessed with clinical judgement   ADL Overall ADL's : Needs assistance/impaired Eating/Feeding: Sitting;Set up                                   Functional mobility during ADLs: Minimal assistance;Moderate assistance;Rolling walker (2 wheels)      Extremity/Trunk Assessment              Vision       Perception     Praxis      Cognition Arousal/Alertness: Awake/alert Behavior During Therapy: WFL for tasks assessed/performed Overall Cognitive Status: Within Functional Limits for tasks assessed                                          Exercises      Shoulder Instructions  General Comments      Pertinent Vitals/ Pain       Pain Assessment Pain Assessment: Faces Faces Pain Scale: No hurt  Home Living                                          Prior Functioning/Environment              Frequency  Min 2X/week        Progress Toward Goals  OT Goals(current goals can now be found in the care plan section)  Progress towards OT goals: Progressing toward goals  Acute Rehab OT Goals Patient Stated Goal: return to PLOF OT Goal Formulation: With patient Time For Goal Achievement: 06/08/22 Potential to Achieve Goals: Good  Plan Discharge plan remains appropriate;Frequency remains appropriate    Co-evaluation                 AM-PAC OT "6 Clicks" Daily Activity     Outcome Measure    Help from another person eating meals?: None Help from another person taking care of personal grooming?: A Little Help from another person toileting, which includes using toliet, bedpan, or urinal?: A Lot Help from another person bathing (including washing, rinsing, drying)?: A Lot Help from another person to put on and taking off regular upper body clothing?: A Little Help from another person to put on and taking off regular lower body clothing?: A Lot 6 Click Score: 16    End of Session Equipment Utilized During Treatment: Rolling walker (2 wheels);Gait belt  OT Visit Diagnosis: Unsteadiness on feet (R26.81);History of falling (Z91.81)   Activity Tolerance Patient tolerated treatment well   Patient Left in chair;with call bell/phone within reach;with chair alarm set   Nurse Communication          Time: [phone] OT Time Calculation (min): 9 min  Charges: OT General Charges $OT Visit: 1 Visit OT Treatments $Therapeutic Activity: 8-22 mins  Arman Filter., MPH, MS, OTR/L ascom [phone] 05/29/22, 9:47 AM

## 2022-05-29 NOTE — Care Management Important Message (Signed)
Important Message  Patient Details  Name: Michele Brewer MRN: [id number] Date of Birth: 08/07/1951   Medicare Important Message Given:  Yes     Bernadette Hoit 05/29/2022, 3:50 PM

## 2022-05-29 NOTE — Discharge Summary (Signed)
Physician Discharge Summary   Patient: Michele Brewer MRN: [id number] DOB: 1951-06-20  Admit date:     05/25/2022  Discharge date: 05/29/22  Discharge Physician: Marrion Coy   PCP: Barbette Reichmann, MD   Recommendations at discharge:      Discharge Diagnoses: Principal Problem:   Traumatic rhabdomyolysis [facility]) Active Problems:   Fall   Parkinson disease [facility])   Depression   Hypomagnesemia  Resolved Problems:   * No resolved hospital problems. *  Hospital Course: Michele Brewer is a 71 y.o. female with medical history significant of Parkinson's disease, depression, rhabdomyolysis, who presents with fall.  She could not get up after the fall, she was on the floor overnight.  By time she came to the hospital, her CPK level was elevated at 928, received fluids, CK level increased to 4450 the next morning. Patient is diagnosed with traumatic rhabdomyolysis, continued on IV fluids.  Assessment and Plan: Traumatic rhabdomyolysis. Fall. Patient had a fall at home, she did not have syncope.  She does not take a statin.   Patient is given IV fluids, finally CK has dropped below 1000. Reanl function still normal. She is medically stable to be discharged   Parkinson disease. Continue Sinemet.   Depression. Continue Cymbalta.         Consultants: None Procedures performed: None  Disposition: Skilled nursing facility Diet recommendation:  Discharge Diet Orders (From admission, onward)     Start     Ordered   05/29/22 0000  Diet - low sodium heart healthy        05/29/22 1005           Cardiac diet DISCHARGE MEDICATION: Allergies as of 05/29/2022   No Known Allergies      Medication List     STOP taking these medications    cyanocobalamin 1000 MCG/ML injection Commonly known as: VITAMIN B12   ergocalciferol 1.25 MG (50000 UT) capsule Commonly known as: VITAMIN D2   multivitamin capsule   olive oil external oil       TAKE these medications     carbidopa-levodopa 25-100 MG tablet Commonly known as: SINEMET IR Take 1 tablet by mouth in the morning, at noon, and at bedtime.   DULoxetine 30 MG capsule Commonly known as: CYMBALTA Take 1 capsule by mouth in the morning and at bedtime.   gabapentin 100 MG capsule Commonly known as: NEURONTIN Take 100 mg by mouth 3 (three) times daily.        Contact information for after-discharge care     Destination     [facility] Preferred SNF .   Service: Skilled Nursing Contact information: [address] [place] [address] [phone]                    Discharge Exam: Ceasar Mons Weights   05/25/22 0737  Weight: 65.8 kg   General exam: Appears calm and comfortable  Respiratory system: Clear to auscultation. Respiratory effort normal. Cardiovascular system: S1 & S2 heard, RRR. No JVD, murmurs, rubs, gallops or clicks. No pedal edema. Gastrointestinal system: Abdomen is nondistended, soft and nontender. No organomegaly or masses felt. Normal bowel sounds heard. Central nervous system: Alert and oriented. No focal neurological deficits. Extremities: Symmetric 5 x 5 power. Skin: No rashes, lesions or ulcers Psychiatry: Judgement and insight appear normal. Mood & affect appropriate.    Condition at discharge: good  The results of significant diagnostics from this hospitalization (including imaging, microbiology, ancillary and laboratory) are listed  below for reference.   Imaging Studies: DG Shoulder Right  Result Date: 05/25/2022 CLINICAL DATA:  Fall, right shoulder pain EXAM: RIGHT SHOULDER - 2+ VIEW COMPARISON:  Chest radiograph 02/03/2021 FINDINGS: Bony demineralization. Mild glenoid spurring. Cervical spondylosis. Normal glenohumeral alignment. Moderately off axis transscapular view but no gross cortical discontinuity in the scapula is identified. No AC joint malalignment noted. IMPRESSION: 1. No acute fracture identified. 2.  Cervical spondylosis. 3. Bony demineralization. Electronically Signed   By: Gaylyn Rong M.D.   On: 05/25/2022 08:26   DG Hip Unilat With Pelvis 2-3 Views Right  Result Date: 05/25/2022 CLINICAL DATA:  Fall last night, hip pain EXAM: DG HIP (WITH OR WITHOUT PELVIS) 2-3V RIGHT COMPARISON:  None Available. FINDINGS: Bony demineralization. Mild spurring of the femoral heads and acetabula, left greater than right. Lower lumbar spondylosis. No well-defined cortical discontinuity to indicate fracture of the right proximal femur or adjacent bony pelvis. IMPRESSION: 1. No fracture or acute bony findings identified radiographically. 2. Mild degenerative arthropathy of both hips. 3. Lower lumbar spondylosis. 4. Bony demineralization. Electronically Signed   By: Gaylyn Rong M.D.   On: 05/25/2022 08:24   CT Head Wo Contrast  Result Date: 05/25/2022 CLINICAL DATA:  Fall EXAM: CT HEAD WITHOUT CONTRAST TECHNIQUE: Contiguous axial images were obtained from the base of the skull through the vertex without intravenous contrast. RADIATION DOSE REDUCTION: This exam was performed according to the departmental dose-optimization program which includes automated exposure control, adjustment of the mA and/or kV according to patient size and/or use of iterative reconstruction technique. COMPARISON:  CT head 02/03/2021 FINDINGS: Brain: There is no acute intracranial hemorrhage, extra-axial fluid collection, or acute infarct. Parenchymal volume is normal. The ventricles are normal in size. Gray-white differentiation is preserved. There is no mass lesion.  There is no mass effect or midline shift. Vascular: No hyperdense vessel or unexpected calcification. Skull: Normal. Negative for fracture or focal lesion. Sinuses/Orbits: The paranasal sinuses are clear. The globes and orbits are unremarkable. Other: None. IMPRESSION: No acute intracranial pathology. Electronically Signed   By: Lesia Hausen M.D.   On: 05/25/2022 08:08     Microbiology: Results for orders placed or performed during the hospital encounter of 02/03/21  Resp Panel by RT-PCR (Flu A&B, Covid) Nasopharyngeal Swab     Status: None   Collection Time: 02/03/21 10:31 PM   Specimen: Nasopharyngeal Swab; Nasopharyngeal(NP) swabs in vial transport medium  Result Value Ref Range Status   SARS Coronavirus 2 by RT PCR NEGATIVE NEGATIVE Final    Comment: (NOTE) SARS-CoV-2 target nucleic acids are NOT DETECTED.  The SARS-CoV-2 RNA is generally detectable in upper respiratory specimens during the acute phase of infection. The lowest concentration of SARS-CoV-2 viral copies this assay can detect is 138 copies/mL. A negative result does not preclude SARS-Cov-2 infection and should not be used as the sole basis for treatment or other patient management decisions. A negative result may occur with  improper specimen collection/handling, submission of specimen other than nasopharyngeal swab, presence of viral mutation(s) within the areas targeted by this assay, and inadequate number of viral copies(<138 copies/mL). A negative result must be combined with clinical observations, patient history, and epidemiological information. The expected result is Negative.  Fact Sheet for Patients:  BloggerCourse.com  Fact Sheet for Healthcare Providers:  SeriousBroker.it  This test is no t yet approved or cleared by the [place] FDA and  has been authorized for detection and/or diagnosis of SARS-CoV-2 by FDA under an Emergency Use Authorization (  EUA). This EUA will remain  in effect (meaning this test can be used) for the duration of the COVID-19 declaration under Section 564(b)(1) of the Act, 21 U.S.C.section 360bbb-3(b)(1), unless the authorization is terminated  or revoked sooner.       Influenza A by PCR NEGATIVE NEGATIVE Final   Influenza B by PCR NEGATIVE NEGATIVE Final    Comment: (NOTE) The Xpert  Xpress SARS-CoV-2/FLU/RSV plus assay is intended as an aid in the diagnosis of influenza from Nasopharyngeal swab specimens and should not be used as a sole basis for treatment. Nasal washings and aspirates are unacceptable for Xpert Xpress SARS-CoV-2/FLU/RSV testing.  Fact Sheet for Patients: BloggerCourse.com  Fact Sheet for Healthcare Providers: SeriousBroker.it  This test is not yet approved or cleared by the [place] FDA and has been authorized for detection and/or diagnosis of SARS-CoV-2 by FDA under an Emergency Use Authorization (EUA). This EUA will remain in effect (meaning this test can be used) for the duration of the COVID-19 declaration under Section 564(b)(1) of the Act, 21 U.S.C. section 360bbb-3(b)(1), unless the authorization is terminated or revoked.  Performed at [facility], [address] [place], [place] [address]   SARS CORONAVIRUS 2 (TAT 6-24 HRS) Nasopharyngeal Nasopharyngeal Swab     Status: None   Collection Time: 02/07/21  1:58 PM   Specimen: Nasopharyngeal Swab  Result Value Ref Range Status   SARS Coronavirus 2 NEGATIVE NEGATIVE Final    Comment: (NOTE) SARS-CoV-2 target nucleic acids are NOT DETECTED.  The SARS-CoV-2 RNA is generally detectable in upper and lower respiratory specimens during the acute phase of infection. Negative results do not preclude SARS-CoV-2 infection, do not rule out co-infections with other pathogens, and should not be used as the sole basis for treatment or other patient management decisions. Negative results must be combined with clinical observations, patient history, and epidemiological information. The expected result is Negative.  Fact Sheet for Patients: HairSlick.no  Fact Sheet for Healthcare Providers: quierodirigir.com  This test is not yet approved or cleared by the [place] FDA and   has been authorized for detection and/or diagnosis of SARS-CoV-2 by FDA under an Emergency Use Authorization (EUA). This EUA will remain  in effect (meaning this test can be used) for the duration of the COVID-19 declaration under Se ction 564(b)(1) of the Act, 21 U.S.C. section 360bbb-3(b)(1), unless the authorization is terminated or revoked sooner.  Performed at [facility], [address], [place], [place] [address]     Labs: CBC: Recent Labs  Lab 05/25/22 0748  WBC 7.1  NEUTROABS 5.9  HGB 12.1  HCT 35.5*  MCV 92.4  PLT 191   Basic Metabolic Panel: Recent Labs  Lab 05/25/22 0748 05/26/22 0748 05/27/22 0713 05/28/22 0349 05/29/22 0622  NA 143 141 139 140 142  K 3.6 3.6 3.6 4.3 3.7  CL 107 110 107 110 111  CO2 28 26 24 28 26   GLUCOSE 118* 87 72 87 85  BUN 17 15 15 15 12   CREATININE 0.63 0.63 0.55 0.56 0.58  CALCIUM 9.0 8.6* 8.6* 8.1* 8.6*  MG  --  1.9 1.7 1.6* 2.0   Liver Function Tests: No results for input(s): "AST", "ALT", "ALKPHOS", "BILITOT", "PROT", "ALBUMIN" in the last 168 hours. CBG: No results for input(s): "GLUCAP" in the last 168 hours.  Discharge time spent: less than 30 minutes.  Signed: , MD [facility] 05/29/2022

## 2022-05-29 NOTE — Plan of Care (Signed)
  Problem: Education: Goal: Knowledge of General Education information will improve Description: Including pain rating scale, medication(s)/side effects and non-pharmacologic comfort measures Outcome: Progressing   Problem: Clinical Measurements: Goal: Cardiovascular complication will be avoided Outcome: Progressing   Problem: Activity: Goal: Risk for activity intolerance will decrease Outcome: Progressing   Problem: Nutrition: Goal: Adequate nutrition will be maintained Outcome: Progressing   Problem: Coping: Goal: Level of anxiety will decrease Outcome: Progressing   Problem: Elimination: Goal: Will not experience complications related to urinary retention Outcome: Progressing   Problem: Pain Managment: Goal: General experience of comfort will improve Outcome: Progressing   Problem: Safety: Goal: Ability to remain free from injury will improve Outcome: Progressing

## 2022-05-29 NOTE — TOC Progression Note (Signed)
Transition of Care [facility]) - Progression Note    Patient Details  Name: Michele Brewer MRN: [id number] Date of Birth: 10-May-1951  [facility]) CM/SW Contact  Marlowe Sax, RN Phone Number: 05/29/2022, 11:05 AM  Clinical Narrative:     Patient going to [facility] has called and she is 3rd on the list, her sister is at [facility], she will notify her family of the room number       Expected Discharge Plan and Services           Expected Discharge Date: 05/29/22                                     Social Determinants of Health (SDOH) Interventions    Readmission Risk Interventions     No data to display

## 2022-05-30 DIAGNOSIS — M6281 Muscle weakness (generalized): Secondary | ICD-10-CM | POA: Diagnosis not present

## 2022-05-30 DIAGNOSIS — G20 Parkinson's disease: Secondary | ICD-10-CM | POA: Diagnosis not present

## 2022-05-30 DIAGNOSIS — M6282 Rhabdomyolysis: Secondary | ICD-10-CM | POA: Diagnosis not present

## 2022-05-30 DIAGNOSIS — F32A Depression, unspecified: Secondary | ICD-10-CM | POA: Diagnosis not present

## 2022-06-02 DIAGNOSIS — R600 Localized edema: Secondary | ICD-10-CM | POA: Diagnosis not present

## 2022-06-02 DIAGNOSIS — D72819 Decreased white blood cell count, unspecified: Secondary | ICD-10-CM | POA: Diagnosis not present

## 2022-06-02 DIAGNOSIS — M6281 Muscle weakness (generalized): Secondary | ICD-10-CM | POA: Diagnosis not present

## 2022-06-02 DIAGNOSIS — M6282 Rhabdomyolysis: Secondary | ICD-10-CM | POA: Diagnosis not present

## 2022-06-07 DIAGNOSIS — R600 Localized edema: Secondary | ICD-10-CM | POA: Diagnosis not present

## 2022-06-07 DIAGNOSIS — R12 Heartburn: Secondary | ICD-10-CM | POA: Diagnosis not present

## 2022-06-07 DIAGNOSIS — D72819 Decreased white blood cell count, unspecified: Secondary | ICD-10-CM | POA: Diagnosis not present

## 2022-06-13 DIAGNOSIS — M6281 Muscle weakness (generalized): Secondary | ICD-10-CM | POA: Diagnosis not present

## 2022-06-13 DIAGNOSIS — D72819 Decreased white blood cell count, unspecified: Secondary | ICD-10-CM | POA: Diagnosis not present

## 2022-06-13 DIAGNOSIS — R600 Localized edema: Secondary | ICD-10-CM | POA: Diagnosis not present

## 2022-06-15 DIAGNOSIS — M6282 Rhabdomyolysis: Secondary | ICD-10-CM | POA: Diagnosis not present

## 2022-06-15 DIAGNOSIS — M6281 Muscle weakness (generalized): Secondary | ICD-10-CM | POA: Diagnosis not present

## 2022-06-15 DIAGNOSIS — R600 Localized edema: Secondary | ICD-10-CM | POA: Diagnosis not present

## 2022-06-15 DIAGNOSIS — D72819 Decreased white blood cell count, unspecified: Secondary | ICD-10-CM | POA: Diagnosis not present

## 2022-06-20 DIAGNOSIS — G20A1 Parkinson's disease without dyskinesia, without mention of fluctuations: Secondary | ICD-10-CM | POA: Diagnosis not present

## 2022-06-21 DIAGNOSIS — F32A Depression, unspecified: Secondary | ICD-10-CM | POA: Diagnosis not present

## 2022-06-21 DIAGNOSIS — T796XXD Traumatic ischemia of muscle, subsequent encounter: Secondary | ICD-10-CM | POA: Diagnosis not present

## 2022-06-21 DIAGNOSIS — E559 Vitamin D deficiency, unspecified: Secondary | ICD-10-CM | POA: Diagnosis not present

## 2022-06-21 DIAGNOSIS — K219 Gastro-esophageal reflux disease without esophagitis: Secondary | ICD-10-CM | POA: Diagnosis not present

## 2022-06-21 DIAGNOSIS — G20C Parkinsonism, unspecified: Secondary | ICD-10-CM | POA: Diagnosis not present

## 2022-06-21 DIAGNOSIS — W19XXXD Unspecified fall, subsequent encounter: Secondary | ICD-10-CM | POA: Diagnosis not present

## 2022-07-14 ENCOUNTER — Emergency Department: Payer: PPO

## 2022-07-14 ENCOUNTER — Other Ambulatory Visit: Payer: Self-pay

## 2022-07-14 ENCOUNTER — Inpatient Hospital Stay
Admission: EM | Admit: 2022-07-14 | Discharge: 2022-07-18 | DRG: 057 | Disposition: A | Discharge status: Skilled Nursing Facility | Payer: PPO | Attending: Internal Medicine | Admitting: Internal Medicine

## 2022-07-14 DIAGNOSIS — M62838 Other muscle spasm: Secondary | ICD-10-CM

## 2022-07-14 DIAGNOSIS — F32A Depression, unspecified: Secondary | ICD-10-CM | POA: Diagnosis present

## 2022-07-14 DIAGNOSIS — R29898 Other symptoms and signs involving the musculoskeletal system: Secondary | ICD-10-CM | POA: Diagnosis not present

## 2022-07-14 DIAGNOSIS — R2681 Unsteadiness on feet: Secondary | ICD-10-CM | POA: Diagnosis not present

## 2022-07-14 DIAGNOSIS — R4789 Other speech disturbances: Secondary | ICD-10-CM | POA: Diagnosis not present

## 2022-07-14 DIAGNOSIS — R600 Localized edema: Secondary | ICD-10-CM | POA: Diagnosis present

## 2022-07-14 DIAGNOSIS — E559 Vitamin D deficiency, unspecified: Secondary | ICD-10-CM

## 2022-07-14 DIAGNOSIS — G319 Degenerative disease of nervous system, unspecified: Secondary | ICD-10-CM | POA: Diagnosis not present

## 2022-07-14 DIAGNOSIS — E538 Deficiency of other specified B group vitamins: Secondary | ICD-10-CM

## 2022-07-14 DIAGNOSIS — G20B2 Parkinson's disease with dyskinesia, with fluctuations: Secondary | ICD-10-CM

## 2022-07-14 DIAGNOSIS — G629 Polyneuropathy, unspecified: Secondary | ICD-10-CM | POA: Diagnosis not present

## 2022-07-14 DIAGNOSIS — R4189 Other symptoms and signs involving cognitive functions and awareness: Secondary | ICD-10-CM | POA: Diagnosis present

## 2022-07-14 DIAGNOSIS — Z79899 Other long term (current) drug therapy: Secondary | ICD-10-CM

## 2022-07-14 DIAGNOSIS — R609 Edema, unspecified: Secondary | ICD-10-CM | POA: Diagnosis not present

## 2022-07-14 DIAGNOSIS — G20C Parkinsonism, unspecified: Secondary | ICD-10-CM | POA: Diagnosis not present

## 2022-07-14 DIAGNOSIS — E569 Vitamin deficiency, unspecified: Secondary | ICD-10-CM | POA: Diagnosis not present

## 2022-07-14 DIAGNOSIS — R262 Difficulty in walking, not elsewhere classified: Secondary | ICD-10-CM | POA: Diagnosis present

## 2022-07-14 DIAGNOSIS — T796XXS Traumatic ischemia of muscle, sequela: Secondary | ICD-10-CM | POA: Diagnosis not present

## 2022-07-14 DIAGNOSIS — M40204 Unspecified kyphosis, thoracic region: Secondary | ICD-10-CM | POA: Diagnosis not present

## 2022-07-14 DIAGNOSIS — E878 Other disorders of electrolyte and fluid balance, not elsewhere classified: Secondary | ICD-10-CM | POA: Diagnosis present

## 2022-07-14 DIAGNOSIS — G20A1 Parkinson's disease without dyskinesia, without mention of fluctuations: Principal | ICD-10-CM | POA: Diagnosis present

## 2022-07-14 DIAGNOSIS — K5900 Constipation, unspecified: Secondary | ICD-10-CM | POA: Diagnosis not present

## 2022-07-14 DIAGNOSIS — M6250 Muscle wasting and atrophy, not elsewhere classified, unspecified site: Secondary | ICD-10-CM | POA: Diagnosis not present

## 2022-07-14 DIAGNOSIS — W19XXXD Unspecified fall, subsequent encounter: Secondary | ICD-10-CM | POA: Diagnosis not present

## 2022-07-14 DIAGNOSIS — Z9181 History of falling: Secondary | ICD-10-CM

## 2022-07-14 DIAGNOSIS — R42 Dizziness and giddiness: Secondary | ICD-10-CM | POA: Diagnosis not present

## 2022-07-14 DIAGNOSIS — E860 Dehydration: Secondary | ICD-10-CM | POA: Diagnosis present

## 2022-07-14 DIAGNOSIS — M549 Dorsalgia, unspecified: Secondary | ICD-10-CM | POA: Diagnosis present

## 2022-07-14 DIAGNOSIS — M7989 Other specified soft tissue disorders: Secondary | ICD-10-CM | POA: Diagnosis not present

## 2022-07-14 DIAGNOSIS — R251 Tremor, unspecified: Secondary | ICD-10-CM | POA: Diagnosis not present

## 2022-07-14 DIAGNOSIS — M25561 Pain in right knee: Secondary | ICD-10-CM | POA: Diagnosis not present

## 2022-07-14 DIAGNOSIS — N390 Urinary tract infection, site not specified: Secondary | ICD-10-CM | POA: Diagnosis not present

## 2022-07-14 DIAGNOSIS — M6259 Muscle wasting and atrophy, not elsewhere classified, multiple sites: Secondary | ICD-10-CM | POA: Diagnosis not present

## 2022-07-14 DIAGNOSIS — M792 Neuralgia and neuritis, unspecified: Secondary | ICD-10-CM | POA: Diagnosis not present

## 2022-07-14 DIAGNOSIS — Z736 Limitation of activities due to disability: Secondary | ICD-10-CM | POA: Diagnosis not present

## 2022-07-14 DIAGNOSIS — M542 Cervicalgia: Secondary | ICD-10-CM | POA: Diagnosis not present

## 2022-07-14 DIAGNOSIS — I10 Essential (primary) hypertension: Secondary | ICD-10-CM | POA: Diagnosis not present

## 2022-07-14 DIAGNOSIS — M546 Pain in thoracic spine: Secondary | ICD-10-CM | POA: Diagnosis not present

## 2022-07-14 LAB — CBC WITH DIFFERENTIAL/PLATELET
Abs Immature Granulocytes: 0.01 10*3/uL (ref 0.00–0.07)
Basophils Absolute: 0 10*3/uL (ref 0.0–0.1)
Basophils Relative: 0 %
Eosinophils Absolute: 0 10*3/uL (ref 0.0–0.5)
Eosinophils Relative: 0 %
HCT: 37.4 % (ref 36.0–46.0)
Hemoglobin: 12.4 g/dL (ref 12.0–15.0)
Immature Granulocytes: 0 %
Lymphocytes Relative: 13 %
Lymphs Abs: 0.6 10*3/uL — ABNORMAL LOW (ref 0.7–4.0)
MCH: 31.4 pg (ref 26.0–34.0)
MCHC: 33.2 g/dL (ref 30.0–36.0)
MCV: 94.7 fL (ref 80.0–100.0)
Monocytes Absolute: 0.5 10*3/uL (ref 0.1–1.0)
Monocytes Relative: 10 %
Neutro Abs: 3.4 10*3/uL (ref 1.7–7.7)
Neutrophils Relative %: 77 %
Platelets: 193 10*3/uL (ref 150–400)
RBC: 3.95 MIL/uL (ref 3.87–5.11)
RDW: 13.6 % (ref 11.5–15.5)
WBC: 4.5 10*3/uL (ref 4.0–10.5)
nRBC: 0 % (ref 0.0–0.2)

## 2022-07-14 LAB — CK: Total CK: 184 U/L (ref 38–234)

## 2022-07-14 LAB — URINALYSIS, ROUTINE W REFLEX MICROSCOPIC
Bilirubin Urine: NEGATIVE
Glucose, UA: NEGATIVE mg/dL
Hgb urine dipstick: NEGATIVE
Ketones, ur: 20 mg/dL — AB
Leukocytes,Ua: NEGATIVE
Nitrite: NEGATIVE
Protein, ur: NEGATIVE mg/dL
Specific Gravity, Urine: 1.02 (ref 1.005–1.030)
pH: 5 (ref 5.0–8.0)

## 2022-07-14 LAB — MAGNESIUM: Magnesium: 2 mg/dL (ref 1.7–2.4)

## 2022-07-14 LAB — BASIC METABOLIC PANEL
Anion gap: 10 (ref 5–15)
BUN: 31 mg/dL — ABNORMAL HIGH (ref 8–23)
CO2: 26 mmol/L (ref 22–32)
Calcium: 10 mg/dL (ref 8.9–10.3)
Chloride: 104 mmol/L (ref 98–111)
Creatinine, Ser: 0.76 mg/dL (ref 0.44–1.00)
GFR, Estimated: >60 mL/min (ref >60)
Glucose, Bld: 95 mg/dL (ref 70–99)
Potassium: 4 mmol/L (ref 3.5–5.1)
Sodium: 140 mmol/L (ref 135–145)

## 2022-07-14 MED ORDER — SODIUM CHLORIDE 0.9 % IV BOLUS
500.0000 mL | Freq: Once | INTRAVENOUS | Status: AC
  Administered 2022-07-14: 500 mL via INTRAVENOUS

## 2022-07-14 MED ORDER — METHOCARBAMOL 500 MG PO TABS
500.0000 mg | ORAL_TABLET | Freq: Once | ORAL | Status: AC
  Administered 2022-07-14: 500 mg via ORAL
  Filled 2022-07-14: qty 1

## 2022-07-14 NOTE — Assessment & Plan Note (Signed)
We will continue patient on Sinemet IR and Neurontin. Continue working with physical therapy. Check orthostatic vitals.

## 2022-07-14 NOTE — Assessment & Plan Note (Signed)
From Parkinson's disease. Physical therapy. Diazepam overnight x1. Fall precautions. Ambulation with assistance.

## 2022-07-14 NOTE — ED Provider Notes (Signed)
Provider Note  Patient Contact: 4:02 PM (approximate)   History   Dehydration   HPI  Michele Brewer is a 71 y.o. female with a history of depression, Parkinson's disease and nontraumatic rhabdo, presents to the emergency department with concern for shakiness, muscle rigidity and feeling as though she is frozen when trying to take a step.  Patient states that it took her 5 minutes to take a step while trying to leave her bathroom earlier today.  She states that the symptoms started approximately a week ago and seemed initially to come and go.  Her symptoms have become more frequent and feel constant at this current time, prompting her to come to the emergency department.  She states that she has new lower extremity swelling that has occurred for the past several months.  She denies associated shortness of breath, chest tightness, increased pillow usage or paroxysmal nocturnal dyspnea. She denies nausea, vomiting or abdominal pain.        Physical Exam   Triage Vital Signs: ED Triage Vitals [07/14/22 1450]  Enc Vitals Group     BP (!) 112/49     Pulse Rate 83     Resp 20     Temp 98.3 F (36.8 C)     Temp src      SpO2 99 %     Weight 145 lb (65.8 kg)     Height      Head Circumference      [facility] Flow      Pain Score 8     Pain Loc      Pain Edu?      Excl. in GC?     Most recent vital signs: Vitals:   07/14/22 2000 07/14/22 2130  BP: 118/72 133/67  Pulse: 85 82  Resp: 16 16  Temp:  98.3 F (36.8 C)  SpO2: 100% 100%     General: Alert and in no acute distress. Eyes:  PERRL. EOMI. Head: No acute traumatic findings ENT:      Nose: No congestion/rhinnorhea.      Mouth/Throat: Mucous membranes are moist. Neck: No stridor. No cervical spine tenderness to palpation. Cardiovascular:  Good peripheral perfusion Respiratory: Normal respiratory effort without tachypnea or retractions. Lungs CTAB. Good air entry to the bases with no decreased or absent breath  sounds. Gastrointestinal: Bowel sounds 4 quadrants. Soft and nontender to palpation. No guarding or rigidity. No palpable masses. No distention. No CVA tenderness. Musculoskeletal: Full range of motion to all extremities.  Neurologic:  No gross focal neurologic deficits are appreciated.  Skin: Patient has 2+ pitting edema bilaterally. Other:   ED Results / Procedures / Treatments   Labs (all labs ordered are listed, but only abnormal results are displayed) Labs Reviewed  CBC WITH DIFFERENTIAL/PLATELET - Abnormal; Notable for the following components:      Result Value   Lymphs Abs 0.6 (*)    All other components within normal limits  BASIC METABOLIC PANEL - Abnormal; Notable for the following components:   BUN 31 (*)    All other components within normal limits  URINALYSIS, ROUTINE W REFLEX MICROSCOPIC - Abnormal; Notable for the following components:   Color, Urine YELLOW (*)    APPearance CLEAR (*)    Ketones, ur 20 (*)    All other components within normal limits  CK  MAGNESIUM        RADIOLOGY  I personally viewed and evaluated these images as part of my medical decision making,  as well as reviewing the written report by the radiologist.  ED Provider Interpretation: No evidence of DVT on bilateral venous ultrasound.  CT head unremarkable for intracranial bleed.  Chest x-ray shows no evidence of pneumonia or pneumothorax.   PROCEDURES:  Critical Care performed: No  Procedures   MEDICATIONS ORDERED IN ED: Medications  sodium chloride 0.9 % bolus 500 mL (0 mLs Intravenous Stopped 07/14/22 2024)  sodium chloride 0.9 % bolus 500 mL (0 mLs Intravenous Stopped 07/14/22 2248)  methocarbamol (ROBAXIN) tablet 500 mg (500 mg Oral Given 07/14/22 2036)     IMPRESSION / MDM / ASSESSMENT AND PLAN / ED COURSE  I reviewed the triage vital signs and the nursing notes.                              Assessment and plan: Weakness Ambulatory dysfunction 71 year old female  with history of Parkinson's disease, nontraumatic rhabdo and depression presents to the emergency department with a sensation of muscle rigidity and stiffness.  Patient states that she is largely incapacitated and cannot take care of herself or her family members at home.  Vital signs were reassuring at triage.  On exam, patient was able to provide historical information but was not able to move her lower extremities easily and her upper extremity seemed rigid.  CBC, BMP and CK were within range.  Urinalysis shows no signs of UTI.  CT head showed no evidence of intracranial bleed.  Bilateral venous ultrasound showed no signs of DVT.  Chest x-ray unremarkable.    Patient received a normal saline bolus in the emergency department.  Given weakness and muscle rigidity and new onset ambulatory dysfunction, will admit to the hospitalist service under the care of Dr. Posey Pronto.     FINAL CLINICAL IMPRESSION(S) / ED DIAGNOSES   Final diagnoses:  Muscle spasms of both lower extremities     Rx / DC Orders   ED Discharge Orders     None        Note:  This document was prepared using Dragon voice recognition software and may include unintentional dictation errors.   Vallarie Mare [place], PA-C 07/14/22 2308    Merlyn Lot, MD 07/16/22 [phone]

## 2022-07-14 NOTE — ED Notes (Signed)
Patient transported to X-ray 

## 2022-07-14 NOTE — Assessment & Plan Note (Signed)
Physical therapy recommending the urine

## 2022-07-14 NOTE — H&P (Signed)
History and Physical    Chief Complaint: Muscle rigidity    HISTORY OF PRESENT ILLNESS: Michele Brewer is an 71 y.o. female  with muscle tightness and generalized weakness.  She can't take care of herself.  It is progressively gotten worse but the patient decided to come to the hospital tonight.  She is not able to ambulate or move her legs.   Pt has PMH as below: Past Medical History:  Diagnosis Date   Depression    Parkinson disease (HCC)      Review of Systems  Musculoskeletal:  Positive for gait problem and myalgias.  Neurological:  Positive for tremors and weakness.       Rigidity.   All other systems reviewed and are negative.    No Known Allergies   Past Surgical History:  Procedure Laterality Date   CESAREAN SECTION        Social History   Socioeconomic History   Marital status: Widowed    Spouse name: Not on file   Number of children: Not on file   Years of education: Not on file   Highest education level: Not on file  Occupational History   Not on file  Tobacco Use   Smoking status: Never   Smokeless tobacco: Never  Substance and Sexual Activity   Alcohol use: Not Currently   Drug use: Never   Sexual activity: Not on file  Other Topics Concern   Not on file  Social History Narrative   Not on file   Social Determinants of Health   Financial Resource Strain: Not on file  Food Insecurity: Not on file  Transportation Needs: Not on file  Physical Activity: Not on file  Stress: Not on file  Social Connections: Not on file      Current Outpatient Medications (Other):    carbidopa-levodopa (SINEMET IR) 25-100 MG tablet, Take 1 tablet by mouth in the morning, at noon, and at bedtime.   DULoxetine (CYMBALTA) 30 MG capsule, Take 1 capsule by mouth in the morning and at bedtime.   gabapentin (NEURONTIN) 100 MG capsule, Take 100 mg by mouth 3 (three) times daily. No current facility-administered medications for this encounter.    ED Course: Pt  in Ed alert awake oriented afebrile. Vitals:   07/14/22 1450 07/14/22 1740 07/14/22 2000 07/14/22 2130  BP: (!) 112/49 127/78 118/72 133/67  Pulse: 83 98 85 82  Resp: 20 15 16 16   Temp: 98.3 F (36.8 C) 98 F (36.7 C)  98.3 F (36.8 C)  TempSrc:  Oral  Oral  SpO2: 99% 100% 100% 100%  Weight: 65.8 kg      No intake/output data recorded. SpO2: 100 % Blood work in ed shows normal CBC, normal BMP, normal CPK, Results for orders placed or performed during the hospital encounter of 07/14/22 (from the past 48 hour(s))  CBC with Differential     Status: Abnormal   Collection Time: 07/14/22  2:52 PM  Result Value Ref Range   WBC 4.5 4.0 - 10.5 K/uL   RBC 3.95 3.87 - 5.11 MIL/uL   Hemoglobin 12.4 12.0 - 15.0 g/dL   HCT 07/16/22 08.6 - 76.1 %   MCV 94.7 80.0 - 100.0 fL   MCH 31.4 26.0 - 34.0 pg   MCHC 33.2 30.0 - 36.0 g/dL   RDW 95.0 93.2 - 67.1 %   Platelets 193 150 - 400 K/uL   nRBC 0.0 0.0 - 0.2 %   Neutrophils Relative % 77 %  Neutro Abs 3.4 1.7 - 7.7 K/uL   Lymphocytes Relative 13 %   Lymphs Abs 0.6 (L) 0.7 - 4.0 K/uL   Monocytes Relative 10 %   Monocytes Absolute 0.5 0.1 - 1.0 K/uL   Eosinophils Relative 0 %   Eosinophils Absolute 0.0 0.0 - 0.5 K/uL   Basophils Relative 0 %   Basophils Absolute 0.0 0.0 - 0.1 K/uL   Immature Granulocytes 0 %   Abs Immature Granulocytes 0.01 0.00 - 0.07 K/uL    Comment: Performed at [facility], [address] [place], [place] [address]  Basic metabolic panel     Status: Abnormal   Collection Time: 07/14/22  2:52 PM  Result Value Ref Range   Sodium 140 135 - 145 mmol/L   Potassium 4.0 3.5 - 5.1 mmol/L   Chloride 104 98 - 111 mmol/L   CO2 26 22 - 32 mmol/L   Glucose, Bld 95 70 - 99 mg/dL    Comment: Glucose reference range applies only to samples taken after fasting for at least 8 hours.   BUN 31 (H) 8 - 23 mg/dL   Creatinine, Ser 1.020.76 0.44 - 1.00 mg/dL   Calcium 72.510.0 8.9 - 36.610.3 mg/dL   GFR, Estimated >44>60 >03>60 mL/min     Comment: (NOTE) Calculated using the CKD-EPI Creatinine Equation (2021)    Anion gap 10 5 - 15    Comment: Performed at [facility], [address] [place], [place] [address]  CK     Status: None   Collection Time: 07/14/22  2:52 PM  Result Value Ref Range   Total CK 184 38 - 234 U/L    Comment: Performed at [facility], [address] [place], [place] [address]  Magnesium     Status: None   Collection Time: 07/14/22  2:52 PM  Result Value Ref Range   Magnesium 2.0 1.7 - 2.4 mg/dL    Comment: Performed at [facility], [address] [place], [place] [address]  Urinalysis, Routine w reflex microscopic Urine, Clean Catch     Status: Abnormal   Collection Time: 07/14/22  9:39 PM  Result Value Ref Range   Color, Urine YELLOW (A) YELLOW   APPearance CLEAR (A) CLEAR   Specific Gravity, Urine 1.020 1.005 - 1.030   pH 5.0 5.0 - 8.0   Glucose, UA NEGATIVE NEGATIVE mg/dL   Hgb urine dipstick NEGATIVE NEGATIVE   Bilirubin Urine NEGATIVE NEGATIVE   Ketones, ur 20 (A) NEGATIVE mg/dL   Protein, ur NEGATIVE NEGATIVE mg/dL   Nitrite NEGATIVE NEGATIVE   Leukocytes,Ua NEGATIVE NEGATIVE    Comment: Performed at [facility], [address] [place], [place] [address]    In Ed pt received: Meds ordered this encounter  Medications   sodium chloride 0.9 % bolus 500 mL   sodium chloride 0.9 % bolus 500 mL   methocarbamol (ROBAXIN) tablet 500 mg    Unresulted Labs (From admission, onward)     Start     Ordered   Pending  CBC  (heparin)  Once,   R       Comments: Baseline for heparin therapy IF NOT ALREADY DRAWN.  Notify MD if PLT < 100 K.    Pending   Pending  Creatinine, serum  (heparin)  Once,   R       Comments: Baseline for heparin therapy IF NOT ALREADY DRAWN.    Pending   Pending  Comprehensive metabolic panel  Tomorrow morning,  R        Pending   Pending  CBC  Tomorrow morning,   R        Pending   Pending  T4, free  Once,   R         Pending   Pending  TSH  Once,   R        Pending   Pending  Vitamin B12  Once,   R        Pending            Admission Imaging : US Venous Img Lower Bilateral (DVT)  Result Date: 07/14/2022 CLINICAL DATA:  Bilateral leg swelling x3 months EXAM: BILATERAL LOWER EXTREMITY VENOUS DOPPLER ULTRASOUND TECHNIQUE: Gray-scale sonography with compression, as well as color and duplex ultrasound, were performed to evaluate the deep venous system(s) from the level of the common femoral vein through the popliteal and proximal calf veins. COMPARISON:  None Available. FINDINGS: VENOUS Normal compressibility of the common femoral, superficial femoral, and popliteal veins, as well as the visualized calf veins. Visualized portions of profunda femoral vein and great saphenous vein unremarkable. No filling defects to suggest DVT on grayscale or color Doppler imaging. Doppler waveforms show normal direction of venous flow, normal respiratory plasticity and response to augmentation. OTHER Subcutaneous edema in the bilateral calves, right greater than left. Limitations: none IMPRESSION: Negative. Electronically Signed   By: Julian Hy M.D.   On: 07/14/2022 17:18   CT Head Wo Contrast  Result Date: 07/14/2022 CLINICAL DATA:  Dizziness, persistent/recurrent, cardiac or vascular cause suspected EXAM: CT HEAD WITHOUT CONTRAST TECHNIQUE: Contiguous axial images were obtained from the base of the skull through the vertex without intravenous contrast. RADIATION DOSE REDUCTION: This exam was performed according to the departmental dose-optimization program which includes automated exposure control, adjustment of the mA and/or kV according to patient size and/or use of iterative reconstruction technique. COMPARISON:  None Available. FINDINGS: Brain: No evidence of acute infarction, hemorrhage, hydrocephalus, extra-axial collection or mass lesion/mass effect. Vascular: No hyperdense vessel identified. Skull: No acute  fracture. Sinuses/Orbits: Clear sinuses.  No acute orbital findings. Other: No mastoid effusions. IMPRESSION: No evidence of acute intracranial abnormality. Electronically Signed   By: Margaretha Sheffield M.D.   On: 07/14/2022 16:30   DG Chest 1 View  Result Date: 07/14/2022 CLINICAL DATA:  Muscle tightness, tremors EXAM: CHEST  1 VIEW COMPARISON:  02/03/2021 FINDINGS: The heart size and mediastinal contours are within normal limits. Both lungs are clear. The visualized skeletal structures are unremarkable. IMPRESSION: No active disease. Electronically Signed   By: Randa Ngo M.D.   On: 07/14/2022 15:21      Physical Examination: Vitals:   07/14/22 1450 07/14/22 1740 07/14/22 2000 07/14/22 2130  BP: (!) 112/49 127/78 118/72 133/67  Pulse: 83 98 85 82  Temp: 98.3 F (36.8 C) 98 F (36.7 C)  98.3 F (36.8 C)  Resp: 20 15 16 16   Weight: 65.8 kg     SpO2: 99% 100% 100% 100%  TempSrc:  Oral  Oral   Physical Exam Vitals and nursing note reviewed.  Constitutional:      General: She is not in acute distress.    Appearance: Normal appearance. She is not ill-appearing, toxic-appearing or diaphoretic.  HENT:     Head: Normocephalic and atraumatic.     Right Ear: Hearing and external ear normal.     Left Ear: Hearing and external ear normal.     Nose: Nose normal. No nasal  deformity.     Mouth/Throat:     Lips: Pink.     Mouth: Mucous membranes are moist.     Tongue: No lesions.     Pharynx: Oropharynx is clear.  Eyes:     Extraocular Movements: Extraocular movements intact.     Pupils: Pupils are equal, round, and reactive to light.  Cardiovascular:     Rate and Rhythm: Normal rate and regular rhythm.     Pulses: Normal pulses.     Heart sounds: Normal heart sounds.  Pulmonary:     Effort: Pulmonary effort is normal.     Breath sounds: Normal breath sounds.  Abdominal:     General: Bowel sounds are normal. There is no distension.     Palpations: Abdomen is soft. There is no  mass.     Tenderness: There is no abdominal tenderness. There is no guarding.     Hernia: No hernia is present.  Musculoskeletal:     Right lower leg: No edema.     Left lower leg: No edema.  Skin:    General: Skin is warm.  Neurological:     General: No focal deficit present.     Mental Status: She is alert and oriented to person, place, and time.     Cranial Nerves: Cranial nerves 2-12 are intact.     Motor: Weakness, tremor and abnormal muscle tone present.  Psychiatric:        Attention and Perception: Attention normal.        Mood and Affect: Mood normal.        Speech: Speech normal.        Behavior: Behavior normal. Behavior is cooperative.        Cognition and Memory: Cognition normal.        Assessment and Plan: * Parkinson disease We will continue patient on Sinemet IR and Neurontin. Attribute patient's stiffness to truncal stiffness and gait dysfunction and ambulatory dysfunction due to Parkinson's disease patient has cogwheel rigidity.  Single dose trial with diazepam overnight. Neurology consult per a.m. team as deemed appropriate and EMG for further evaluation. CPK and aldolase.  Electrolyte abnormality Replace and follow levels as deemed appropriate for sodium potassium calcium magnesium phosphorus etc.  Cog-wheel rigidity From Parkinson's disease. Physical therapy. Diazepam overnight x1. Fall precautions. Ambulation with assistance.  Ambulatory dysfunction Due to patient's Parkinson disease patient's developed ambulatory dysfunction. CPK is within normal limits. We will get physical therapy consult.  Additional supportive care as deemed appropriate.      DVT prophylaxis:  Heparin  Code Status:  Full code  Family Communication:  Karle Plumber friend: [phone].  Disposition Plan:  Home  Consults called:  None  Admission status: Observation  Unit/ Expected LOS: MedSurg/1 to 2 days.   Gertha Calkin MD [facility]  6 PM-  2 AM. Please contact me via secure Chat 6 PM-2 AM. [phone] ( Pager ) To contact the [facility] Attending or Consulting provider 7A - 7P or covering provider during after hours 7P -7A, for this patient.   Check the care team in [facility] and look for a) attending/consulting [facility] provider listed and b) the [facility] team listed Log into [email or web address] and use [place] [facility] universal password to access. If you do not have the password, please contact the hospital operator. Locate the [facility] provider you are looking for under [facility] and page to a number that you can be directly reached. If you still have difficulty reaching the provider, please page  the [facility] (Director on Call) for the Hospitalists listed on amion for assistance. [email or web address] 07/14/2022, 11:47 PM

## 2022-07-14 NOTE — ED Triage Notes (Signed)
Pt BIB EMS for muscle tightness and tremors. Pt seen for same before. Pt has parkinsons. Per EMS, pts tremors and muscle tightness get worse with dehydration. Pt also has some lower leg swelling that started about 3 months ago.

## 2022-07-14 NOTE — Assessment & Plan Note (Signed)
Replace and follow levels as deemed appropriate for sodium potassium calcium magnesium phosphorus etc.

## 2022-07-15 ENCOUNTER — Inpatient Hospital Stay: Payer: PPO

## 2022-07-15 ENCOUNTER — Encounter: Payer: Self-pay | Admitting: Internal Medicine

## 2022-07-15 ENCOUNTER — Observation Stay: Payer: PPO

## 2022-07-15 DIAGNOSIS — R29898 Other symptoms and signs involving the musculoskeletal system: Secondary | ICD-10-CM | POA: Diagnosis present

## 2022-07-15 DIAGNOSIS — G629 Polyneuropathy, unspecified: Secondary | ICD-10-CM | POA: Diagnosis present

## 2022-07-15 DIAGNOSIS — R262 Difficulty in walking, not elsewhere classified: Secondary | ICD-10-CM | POA: Diagnosis present

## 2022-07-15 DIAGNOSIS — E559 Vitamin D deficiency, unspecified: Secondary | ICD-10-CM | POA: Diagnosis present

## 2022-07-15 DIAGNOSIS — Z79899 Other long term (current) drug therapy: Secondary | ICD-10-CM | POA: Diagnosis not present

## 2022-07-15 DIAGNOSIS — R4189 Other symptoms and signs involving cognitive functions and awareness: Secondary | ICD-10-CM | POA: Diagnosis present

## 2022-07-15 DIAGNOSIS — E860 Dehydration: Secondary | ICD-10-CM | POA: Diagnosis present

## 2022-07-15 DIAGNOSIS — G319 Degenerative disease of nervous system, unspecified: Secondary | ICD-10-CM | POA: Diagnosis not present

## 2022-07-15 DIAGNOSIS — Z9181 History of falling: Secondary | ICD-10-CM | POA: Diagnosis not present

## 2022-07-15 DIAGNOSIS — K5900 Constipation, unspecified: Secondary | ICD-10-CM | POA: Diagnosis present

## 2022-07-15 DIAGNOSIS — M6250 Muscle wasting and atrophy, not elsewhere classified, unspecified site: Secondary | ICD-10-CM | POA: Diagnosis present

## 2022-07-15 DIAGNOSIS — F32A Depression, unspecified: Secondary | ICD-10-CM | POA: Diagnosis present

## 2022-07-15 DIAGNOSIS — G20B2 Parkinson's disease with dyskinesia, with fluctuations: Secondary | ICD-10-CM | POA: Diagnosis not present

## 2022-07-15 DIAGNOSIS — R600 Localized edema: Secondary | ICD-10-CM | POA: Diagnosis present

## 2022-07-15 DIAGNOSIS — G20A1 Parkinson's disease without dyskinesia, without mention of fluctuations: Secondary | ICD-10-CM | POA: Diagnosis present

## 2022-07-15 DIAGNOSIS — E538 Deficiency of other specified B group vitamins: Secondary | ICD-10-CM | POA: Diagnosis present

## 2022-07-15 DIAGNOSIS — M549 Dorsalgia, unspecified: Secondary | ICD-10-CM | POA: Diagnosis present

## 2022-07-15 DIAGNOSIS — M62838 Other muscle spasm: Secondary | ICD-10-CM | POA: Diagnosis not present

## 2022-07-15 LAB — CBC
HCT: 32.2 % — ABNORMAL LOW (ref 36.0–46.0)
HCT: 34.2 % — ABNORMAL LOW (ref 36.0–46.0)
Hemoglobin: 11.4 g/dL — ABNORMAL LOW (ref 12.0–15.0)
Hemoglobin: 11.7 g/dL — ABNORMAL LOW (ref 12.0–15.0)
MCH: 33.6 pg (ref 26.0–34.0)
MCH: 31.8 pg (ref 26.0–34.0)
MCHC: 35.4 g/dL (ref 30.0–36.0)
MCHC: 34.2 g/dL (ref 30.0–36.0)
MCV: 95 fL (ref 80.0–100.0)
MCV: 92.9 fL (ref 80.0–100.0)
Platelets: 170 10*3/uL (ref 150–400)
Platelets: 158 10*3/uL (ref 150–400)
RBC: 3.39 MIL/uL — ABNORMAL LOW (ref 3.87–5.11)
RBC: 3.68 MIL/uL — ABNORMAL LOW (ref 3.87–5.11)
RDW: 13.9 % (ref 11.5–15.5)
RDW: 13.8 % (ref 11.5–15.5)
WBC: 5.9 10*3/uL (ref 4.0–10.5)
WBC: 5.6 10*3/uL (ref 4.0–10.5)
nRBC: 0 % (ref 0.0–0.2)
nRBC: 0 % (ref 0.0–0.2)

## 2022-07-15 LAB — COMPREHENSIVE METABOLIC PANEL
ALT: 17 U/L (ref 0–44)
AST: 41 U/L (ref 15–41)
Albumin: 3.5 g/dL (ref 3.5–5.0)
Alkaline Phosphatase: 84 U/L (ref 38–126)
Anion gap: 7 (ref 5–15)
BUN: 24 mg/dL — ABNORMAL HIGH (ref 8–23)
CO2: 24 mmol/L (ref 22–32)
Calcium: 8.9 mg/dL (ref 8.9–10.3)
Chloride: 108 mmol/L (ref 98–111)
Creatinine, Ser: 0.62 mg/dL (ref 0.44–1.00)
GFR, Estimated: >60 mL/min (ref >60)
Glucose, Bld: 78 mg/dL (ref 70–99)
Potassium: 3.6 mmol/L (ref 3.5–5.1)
Sodium: 139 mmol/L (ref 135–145)
Total Bilirubin: 1.6 mg/dL — ABNORMAL HIGH (ref 0.3–1.2)
Total Protein: 6.4 g/dL — ABNORMAL LOW (ref 6.5–8.1)

## 2022-07-15 LAB — CREATININE, SERUM
Creatinine, Ser: 0.66 mg/dL (ref 0.44–1.00)
GFR, Estimated: >60 mL/min

## 2022-07-15 LAB — TSH: TSH: 0.456 u[IU]/mL (ref 0.350–4.500)

## 2022-07-15 LAB — T4, FREE: Free T4: 1.32 ng/dL — ABNORMAL HIGH (ref 0.61–1.12)

## 2022-07-15 LAB — FOLATE: Folate: 5.2 ng/mL — ABNORMAL LOW (ref >5.9)

## 2022-07-15 LAB — VITAMIN B12: Vitamin B-12: 145 pg/mL — ABNORMAL LOW (ref 180–914)

## 2022-07-15 MED ORDER — INFLUENZA VAC A&B SA ADJ QUAD 0.5 ML IM PRSY
0.5000 mL | PREFILLED_SYRINGE | INTRAMUSCULAR | Status: DC
  Filled 2022-07-15: qty 0.5

## 2022-07-15 MED ORDER — CEFAZOLIN SODIUM-DEXTROSE 1-4 GM/50ML-% IV SOLN
1.0000 g | Freq: Three times a day (TID) | INTRAVENOUS | Status: DC
  Administered 2022-07-15 – 2022-07-18 (×9): 1 g via INTRAVENOUS
  Filled 2022-07-15 (×10): qty 50

## 2022-07-15 MED ORDER — ACETAMINOPHEN 650 MG RE SUPP: 650.0000 mg | Freq: Four times a day (QID) | RECTAL | Status: DC | PRN

## 2022-07-15 MED ORDER — ACETAMINOPHEN 325 MG PO TABS
650.0000 mg | ORAL_TABLET | Freq: Four times a day (QID) | ORAL | Status: DC | PRN
  Administered 2022-07-15 – 2022-07-16 (×3): 650 mg via ORAL
  Filled 2022-07-15 (×3): qty 2

## 2022-07-15 MED ORDER — DIAZEPAM 2 MG PO TABS
2.0000 mg | ORAL_TABLET | Freq: Once | ORAL | Status: AC
  Administered 2022-07-15: 2 mg via ORAL
  Filled 2022-07-15: qty 1

## 2022-07-15 MED ORDER — MORPHINE SULFATE (PF) 2 MG/ML IV SOLN
2.0000 mg | INTRAVENOUS | Status: DC | PRN
  Administered 2022-07-16 (×2): 2 mg via INTRAVENOUS
  Filled 2022-07-15 (×3): qty 1

## 2022-07-15 MED ORDER — GABAPENTIN 100 MG PO CAPS
100.0000 mg | ORAL_CAPSULE | Freq: Three times a day (TID) | ORAL | Status: DC
  Administered 2022-07-15 – 2022-07-18 (×11): 100 mg via ORAL
  Filled 2022-07-15 (×11): qty 1

## 2022-07-15 MED ORDER — SODIUM CHLORIDE 0.9% FLUSH
3.0000 mL | Freq: Two times a day (BID) | INTRAVENOUS | Status: DC
  Administered 2022-07-15 – 2022-07-18 (×7): 3 mL via INTRAVENOUS

## 2022-07-15 MED ORDER — HEPARIN SODIUM (PORCINE) 5000 UNIT/ML IJ SOLN
5000.0000 [IU] | Freq: Two times a day (BID) | INTRAMUSCULAR | Status: DC
  Administered 2022-07-15 – 2022-07-18 (×8): 5000 [IU] via SUBCUTANEOUS
  Filled 2022-07-15 (×8): qty 1

## 2022-07-15 MED ORDER — DULOXETINE HCL 30 MG PO CPEP
30.0000 mg | ORAL_CAPSULE | Freq: Two times a day (BID) | ORAL | Status: DC
  Administered 2022-07-15 – 2022-07-18 (×7): 30 mg via ORAL
  Filled 2022-07-15 (×7): qty 1

## 2022-07-15 MED ORDER — SODIUM CHLORIDE 0.9 % IV SOLN: INTRAVENOUS | Status: AC

## 2022-07-15 MED ORDER — CYANOCOBALAMIN 1000 MCG/ML IJ SOLN
1000.0000 ug | Freq: Every day | INTRAMUSCULAR | Status: AC
  Administered 2022-07-15 – 2022-07-17 (×3): 1000 ug via INTRAMUSCULAR
  Filled 2022-07-15 (×3): qty 1

## 2022-07-15 MED ORDER — CARBIDOPA-LEVODOPA 25-100 MG PO TABS
0.5000 | ORAL_TABLET | Freq: Every day | ORAL | Status: DC
  Administered 2022-07-15: 0.5 via ORAL

## 2022-07-15 MED ORDER — CARBIDOPA-LEVODOPA 25-100 MG PO TABS
1.0000 | ORAL_TABLET | Freq: Two times a day (BID) | ORAL | Status: DC
  Administered 2022-07-15 – 2022-07-16 (×3): 1 via ORAL
  Filled 2022-07-15 (×4): qty 1

## 2022-07-15 MED ORDER — MECLIZINE HCL 25 MG PO TABS: 12.5000 mg | ORAL_TABLET | Freq: Three times a day (TID) | ORAL | Status: DC | PRN

## 2022-07-15 NOTE — Assessment & Plan Note (Signed)
On Cymbalta 

## 2022-07-15 NOTE — Consult Note (Signed)
Neurology Consultation Reason for Consult: Right lower extremity weakness Requesting Physician: Fidela Juneau  CC: Cannot move my legs  History is obtained from: Patient and chart review  HPI: Michele Brewer is a 71 y.o. female with a PMHx significant for Parkinson's disease complicated by poor tolerance of Sinemet and cognitive impairment as well as chart history of visual hallucinations which she denies, depression  She reports that she was sitting on the side of the bed and found herself unable to get up and therefore decided to come to the ED for further evaluation via EMS.  She denies being weaker on any one side but reports that both her legs feel heavy.  She denies any bowel or bladder symptoms (including chronic constipation) or change in sensation.  When asked about anosmia she reports waxing and waning sense of smell. She does note she has lost nearly 100 lbs but reports her weight has been more stable recently  Patient denies any other recent symptoms to me but on reviewing the records on 10/27 home health had called her physician noting that the patient had refused visits over the course of the week reporting that she was not feeling well having weakness and some vomiting.  She was recently admitted from 9/9-9/11 for traumatic rhabdomyolysis as she had fallen and could not get up with CPK level peaking at 4450.  She additionally presented on 02/08/2021 again for traumatic after a fall and generalized weakness and again was discharged to a skilled nursing facility.  Her Sinemet is always documented as 1 tablet 3 times a day, a very low dose of Sinemet.  There is also note that she has not tolerated dose increases so that she was recommended for dose increase at her last outpatient follow-up with Dr. Sherryll Burger on 06/02/2021 (1.5 tablets 3 times daily was recommended).  In fact she tells me she is taking Sinemet 1 tablet twice a day and half a tablet for her third dose.  At our initial conversation  she does not clearly tell me which side effects of Sinemet are intolerable for her.  She notes that she has been diagnosed with Parkinson's disease for over 15 years and confirms she has not been on higher dose of Sinemet.  She does inquire if there are other medication options.  She reports her history of visual hallucinations was just a medication side effect/hospital delirium when she was giving birth to her daughter, with "the doors going up and down instead of opening and closing"  ROS: All other review of systems was negative except as noted in the HPI.   Past Medical History:  Diagnosis Date   Depression    Parkinson disease    Past Surgical History:  Procedure Laterality Date   CESAREAN SECTION     Current Outpatient Medications  Medication Instructions   carbidopa-levodopa (SINEMET IR) 25-100 MG tablet 1 tablet, Oral, As directed, Take 1 tablet in the morning, 1 tablet at midday and 1/2 tablet at bedtime.   DULoxetine (CYMBALTA) 30 MG capsule 1 capsule, Oral, 2 times daily   gabapentin (NEURONTIN) 100 mg, Oral, 3 times daily     Family History  Problem Relation Age of Onset   Gout Mother    Heart disease Father     Social History:  reports that she has never smoked. She has never used smokeless tobacco. She reports that she does not currently use alcohol. She reports that she does not use drugs.  Exam: Current vital signs: BP 104/62 (BP  Location: Left Arm)   Pulse 77   Temp 98.1 F (36.7 C) (Oral)   Resp 16   Ht 5\' 4"  (1.626 m)   Wt 65.8 kg   SpO2 98%   BMI 24.89 kg/m  Vital signs in last 24 hours: Temp:  [98 F (36.7 C)-98.3 F (36.8 C)] 98.1 F (36.7 C) (10/28 0848) Pulse Rate:  [64-98] 77 (10/28 0848) Resp:  [12-20] 16 (10/28 0848) BP: (104-133)/(42-78) 104/62 (10/28 0848) SpO2:  [97 %-100 %] 98 % (10/28 0848)   Physical Exam  Constitutional: Appears thin Psych: Affect flat (masked facies)  Eyes: No scleral injection HENT: No oropharyngeal  obstruction.  MSK: no joint deformities. Muscle wasting and atrophy of the bilateral hands Cardiovascular: Perfusing extremities well Respiratory: Effort normal, non-labored breathing GI: Soft.  No distension. There is no tenderness.  Skin: Warm dry and intact visible skin. Some mild edema of the bilateral LE  Neuro: Mental Status: Patient is awake, alert, oriented to person, place, month, year, and situation. Patient is able to give a clear and coherent history, though slow to respond at times  No signs of aphasia or neglect Cranial Nerves: II: Visual Fields are full. Pupils are equal, round, and reactive to light.   III,IV, VI: EOMI without ptosis or diploplia.  V: Facial sensation is symmetric to light touch VII: Facial movement is notable for masked facies but symmetric smile.  VIII: hearing is intact to voice X: Uvula elevates symmetrically XI: Shoulder shrug is symmetric. XII: tongue is midline without atrophy or fasciculations.  Motor: Tone is notable for a cogwheeling tremor. Bulk is notable for atrophy particularly notable in bilateral hands. 5/5 strength was present in all four extremities, except hip flexion bilaterally which may be effort limited.  Sensory: Sensation is symmetric to light touch and temperature in the arms and legs with length dependent loss of temperature sensation Deep Tendon Reflexes: 3+ and symmetric in the brachioradialis, biceps, + Hoffman's bilaterally and 3+ patellae.  Cerebellar: FNF intact bilaterally Gait:  Deferred    I have reviewed labs in epic and the results pertinent to this consultation are:   Lab Results  Component Value Date   VITAMINB12 145 (L) 07/15/2022   01/09/2022  B12 -- 170 (L) Vit D -- 22.6 (L)  I have reviewed the images obtained:  MRI brain personally reviewed, agree with radiology:   Normal MRI appearance of the brain for age. No acute or focal lesion to explain the patient's symptoms.  Assessment: Discussed  with the patient that Parkinson's disease is a progressive neurodegenerative disorder and typically requires escalating doses of Sinemet for treatment.  The rigidity and tremor she exhibits on examination indicate that she is underdosed.  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  With her significant muscle atrophy I am concerned about the possibility of other nutritional deficiencies playing a role in causing neuropathy and potentially myelopathy secondary to B12 deficiency. Given her age, compressive myelopathy from disc disease is also a possibility, though she denies bowel/bladder symptoms exam is notable for new hyperreflexia not documented on last neurology examination in addition to bilateral hip flexor weakness (vs. Freezing vs. Poor effort). Myelopathy could be due to her B12 deficiency since at least 12/2021, but will additionally assess for structural lesions with MRI C and T spine  Recommendations: - Given low B12 and history of low Vit D, will send additional nutritional labs (thiamine, B3, B6,  folate, copper, zinc) - MRI C spine and T spine - Neurology will continue to follow    Lenawee Chapel [phone] [facility] coverage for [facility] is from 8 AM to 4 AM in-house and 4 PM to 8 PM by telephone/video. 8 PM to 8 AM emergent questions or overnight urgent questions should be addressed to Teleneurology On-call or [facility] neurohospitalist; contact information can be found on AMION

## 2022-07-15 NOTE — Evaluation (Signed)
Physical Therapy Evaluation Patient Details Name: Michele Brewer MRN: [id number] DOB: 1951-07-05 Today's Date: 07/15/2022  History of Present Illness  Michele Brewer is an 71 y.o. female  with muscle tightness and generalized weakness.   She can't take care of herself.  It is progressively gotten worse but the patient decided to come to the hospital 07/14/2022.  She was not able to ambulate or move her legs since yesterday. Pt describes weakness or being frozen in place for 5 mins. Till day before yesterday pt was walking with cane.  Clinical Impression  Pt receive din bed agreeable to participate in therapy evaluation. Pt reported of 7/10 pain with touch and movement and 0/10 at rest in RLE and Sacral bone.  Pt PLOF is Ind with household level education and using ramp with SPC. Today's assessment reveals weakness in BLE R>L, decreased neuromotor control, increased pain with movement and touch with edema noted in R>LLE. Pt remained dizzy through out he sessio with intermittent spell of spinning. Modified passive rolling of head in supine to Right increased spinning. Pt has history of vertigo. Closing of eyes makes pt feel better. Pt required max assit for bed mobility and max of 1 for STS and Max of 2 to remaining standing for 30 secs with NBOS unable correct stance. Pt neuromotor control in BLE has decreased severely as compared to pt's baseline. Pt may benefit from skilled pT interventions after pain meds. Pt is Awaiting Neurology consult. PT will continue in acute. Pt will benefit from SNF after acute to return t [facility].     Recommendations for follow up therapy are one component of a multi-disciplinary discharge planning process, led by the attending physician.  Recommendations may be updated based on patient status, additional functional criteria and insurance authorization.  Follow Up Recommendations Skilled nursing-short term rehab (<3 hours/day) Can patient physically be transported by private  vehicle: No    Assistance Recommended at Discharge Frequent or constant Supervision/Assistance  Patient can return home with the following  Two people to help with bathing/dressing/bathroom;Two people to help with walking and/or transfers;Assistance with cooking/housework;Assistance with feeding;Direct supervision/assist for medications management;Direct supervision/assist for financial management;Assist for transportation;Help with stairs or ramp for entrance    Equipment Recommendations None recommended by PT  Recommendations for Other Services  OT consult    Functional Status Assessment Patient has had a recent decline in their functional status and demonstrates the ability to make significant improvements in function in a reasonable and predictable amount of time.     Precautions / Restrictions Precautions Precautions: Fall Restrictions Weight Bearing Restrictions: No      Mobility  Bed Mobility Overal bed mobility: Needs Assistance Bed Mobility: Rolling, Supine to Sit, Sit to Supine Rolling: Max assist   Supine to sit: Max assist Sit to supine: Max assist   General bed mobility comments: In pain throught out with dizzinesss and  spinning. L sided lean    Transfers Overall transfer level: Needs assistance Equipment used: Rolling walker (2 wheels) Transfers: Sit to/from Stand Sit to Stand: +2 physical assistance           General transfer comment: with L sided lean    Ambulation/Gait               General Gait Details: Unable  Stairs: Unable            Wheelchair Mobility    Modified Rankin (Stroke Patients Only)       Balance Overall balance assessment: Needs assistance Sitting-balance  support: Single extremity supported Sitting balance-Leahy Scale: Poor Sitting balance - Comments: dizzy and intermittently spinning. Postural control: Left lateral lean Standing balance support: Bilateral upper extremity supported Standing balance-Leahy  Scale: Poor Standing balance comment: Max assit to stand with L lateral lean without upright posture.                             Pertinent Vitals/Pain Pain Assessment Pain Assessment: 0-10 Pain Score: 7  Pain Location: LLE Pain Descriptors / Indicators: Discomfort, Sharp, Aching Pain Intervention(s): Limited activity within patient's tolerance, Monitored during session, RN gave pain meds during session    [place] expects to be discharged to:: Private residence Living Arrangements: Other relatives Available Help at Discharge: Family Type of Home: Mobile home Home Access: Ramped entrance       Home Layout: One level Home Equipment: BSC/3in1;Cane - single point;Toilet riser;Shower seat      Prior Function Prior Level of Function : Independent/Modified Independent ((last fall in September))             Mobility Comments: SPC for household/community amb, reports "it has not been enough" lately ADLs Comments: MOD I in ADL, assist for IADL as needed     Hand Dominance   Dominant Hand: Right    Extremity/Trunk Assessment   Upper Extremity Assessment Upper Extremity Assessment: Generalized weakness    Lower Extremity Assessment Lower Extremity Assessment: Generalized weakness;RLE deficits/detail RLE Deficits / Details: {Ain with touch and movement RLE: Unable to fully assess due to pain RLE Sensation: WNL RLE Coordination: decreased gross motor;decreased fine motor    Cervical / Trunk Assessment Cervical / Trunk Assessment: Other exceptions (flexed posutre)  Communication   Communication: No difficulties  Cognition Arousal/Alertness: Awake/alert Behavior During Therapy: WFL for tasks assessed/performed, Flat affect Overall Cognitive Status: Within Functional Limits for tasks assessed                                          General Comments  Pt communicative and able to follow commands well. But unable to move  with time.     Exercises     Assessment/Plan    PT Assessment Patient needs continued PT services  PT Problem List Decreased strength;Decreased range of motion;Decreased activity tolerance;Decreased balance;Decreased mobility;Decreased coordination;Pain       PT Treatment Interventions Gait training;Functional mobility training;Therapeutic activities;Therapeutic exercise;Balance training;Neuromuscular re-education;Patient/family education;Stair training    PT Goals (Current goals can be found in the Care Plan section)  Acute Rehab PT Goals Patient Stated Goal: " I want to feel better." PT Goal Formulation: With patient Time For Goal Achievement: 07/29/22 Potential to Achieve Goals: Fair    Frequency Min 2X/week     Co-evaluation               AM-PAC PT "6 Clicks" Mobility  Outcome Measure Help needed turning from your back to your side while in a flat bed without using bedrails?: A Lot Help needed moving from lying on your back to sitting on the side of a flat bed without using bedrails?: A Lot Help needed moving to and from a bed to a chair (including a wheelchair)?: A Lot Help needed standing up from a chair using your arms (e.g., wheelchair or bedside chair)?: A Lot Help needed to walk in hospital room?: Total Help needed climbing 3-5 steps  with a railing? : Total 6 Click Score: 10    End of Session Equipment Utilized During Treatment: Gait belt Activity Tolerance: Patient limited by pain;Treatment limited secondary to medical complications (Comment) Patient left: in bed;with call bell/phone within reach;with chair alarm set Nurse Communication: Mobility status (pain level and dizziness and spinning) PT Visit Diagnosis: Unsteadiness on feet (R26.81);Other abnormalities of gait and mobility (R26.89);History of falling (Z91.81);Muscle weakness (generalized) (M62.81);Pain;Dizziness and giddiness (R42);Other symptoms and signs involving the nervous system  (R29.898);BPPV BPPV - Right/Left : Right Pain - Right/Left: Right Pain - part of body: Leg (Sacral bone)    Time: 3276-1470 PT Time Calculation (min) (ACUTE ONLY): 20 min   Charges:   PT Evaluation $PT Eval Moderate Complexity: 1 Mod PT Treatments $Therapeutic Activity: 8-22 mins      Travin Marik PT DPT 11:50 AM,07/15/22

## 2022-07-15 NOTE — ED Notes (Signed)
Straight stick using butterfly to collect labs. Right forearm

## 2022-07-15 NOTE — Progress Notes (Signed)
  Progress Note   Patient: Michele Brewer TSV:[id number] DOB: 13-May-1951 DOA: 07/14/2022     0 DOS: the patient was seen and examined on 07/15/2022     Assessment and Plan: * Right leg weakness Patient withdrew her leg to Babinski but did have pain in the morning so.  Unable to lift her right leg up off the bed.  MRI of the brain was negative for stroke.  Neurological consultation to evaluate if there is any other neurological cause.  I did place on empiric antibiotics just in case cellulitis with subcutaneous edema seen on both legs on ultrasound.  Parkinson disease We will continue patient on Sinemet IR and Neurontin. Continue working with physical therapy. Check orthostatic vitals.  Depression On Cymbalta  Neuropathy Continue gabapentin  Ambulatory dysfunction Physical therapy recommending the urine         Subjective: Patient states that she is having difficulty moving her right leg from yesterday.  Having difficulty standing and walking.  Does have some pain in her right leg.  Has a history of Parkinson's and sometimes her first step is difficult for her.  No back pain.  Physical Exam: Vitals:   07/15/22 0344 07/15/22 0400 07/15/22 0515 07/15/22 0848  BP: (!) 112/55 (!) 104/42  104/62  Pulse: 64 71  77  Resp: 12 20  16   Temp:  98 F (36.7 C)  98.1 F (36.7 C)  TempSrc:  Oral  Oral  SpO2: 100% 97%  98%  Weight:      Height:   5\' 4"  (1.626 m)    Physical Exam HENT:     Head: Normocephalic.     Mouth/Throat:     Pharynx: No oropharyngeal exudate.  Eyes:     General: Lids are normal.     Conjunctiva/sclera: Conjunctivae normal.  Cardiovascular:     Rate and Rhythm: Normal rate and regular rhythm.     Heart sounds: Normal heart sounds, S1 normal and S2 normal.  Pulmonary:     Breath sounds: No decreased breath sounds, wheezing, rhonchi or rales.  Abdominal:     Palpations: Abdomen is soft.     Tenderness: There is no abdominal tenderness.  Musculoskeletal:      Right lower leg: Swelling present.     Left lower leg: Swelling present.  Skin:    General: Skin is warm.     Comments: Possible slight redness posterior right leg  Neurological:     Mental Status: She is alert and oriented to person, place, and time.     Comments: Unable to lift her right leg up off the bed.  Did withdraw her leg when I did a Babinski and had some pain in doing so.  Slightly able to lift the left leg up off the bed.     Data Reviewed: Vitamin B12 low at 145, CK1 84, creatinine 0.62, hemoglobin 11.7, platelet count 158, TSH 0.56  Family Communication: Tried to reach primary contact but phone number likely not good  Disposition: Status is: Observation Physical therapy recommending rehab.  Neurology consultation pending.  Trying to figure out Right leg weakness secondary to a neurological issue versus Parkinson's.  Planned Discharge Destination: Rehab    Time spent: 28 minutes  Author: Loletha Grayer, MD 07/15/2022 2:24 PM  For on call review [email or web address].

## 2022-07-15 NOTE — ED Notes (Signed)
Request made for transport to the floor ?

## 2022-07-15 NOTE — NC FL2 (Signed)
[place] LEVEL OF CARE SCREENING TOOL     IDENTIFICATION  Patient Name: Michele Brewer Birthdate: April 23, 1951 Sex: female Admission Date (Current Location): 07/14/2022  [facility] and [place] Number:  Engineering geologist and Address:  [facility], [address], [place], [place] [address]      Provider Number: [id number]  Attending Physician Name and Address:  Loletha Grayer, MD  Relative Name and Phone Number:       Current Level of Care: Hospital Recommended Level of Care: [place] Prior Approval Number:    Date Approved/Denied:   PASRR Number: [id number]  Discharge Plan: SNF    Current Diagnoses: Patient Active Problem List   Diagnosis Date Noted   Right leg weakness 07/15/2022   Neuropathy 07/15/2022   Ambulatory dysfunction 07/14/2022   Hypomagnesemia 05/28/2022   Parkinson disease 05/25/2022   Depression 05/25/2022   Traumatic rhabdomyolysis ([place]) 02/04/2021   Fall 02/03/2021    Orientation RESPIRATION BLADDER Height & Weight     Self, Time, Situation, Place  Normal Continent Weight: 65.8 kg Height:  5\' 4"  (162.6 cm)  BEHAVIORAL SYMPTOMS/MOOD NEUROLOGICAL BOWEL NUTRITION STATUS      Continent Diet  AMBULATORY STATUS COMMUNICATION OF NEEDS Skin   Limited Assist Verbally Normal                       Personal Care Assistance Level of Assistance  Bathing, Dressing Bathing Assistance: Limited assistance         Functional Limitations Info             SPECIAL CARE FACTORS FREQUENCY  PT (By licensed PT), OT (By licensed OT)     PT Frequency: 5 times a week OT Frequency: 5 times a week            Contractures Contractures Info: Not present    Additional Factors Info  Code Status, Allergies Code Status Info: Full Allergies Info: NKA           Current Medications (07/15/2022):  This is the current hospital active medication list Current Facility-Administered Medications   Medication Dose Route Frequency Provider Last Rate Last Admin   0.9 %  sodium chloride infusion   Intravenous Continuous Para Skeans, MD 20 mL/hr at 07/15/22 0409 New Bag at 07/15/22 0409   acetaminophen (TYLENOL) tablet 650 mg  650 mg Oral Q6H PRN Para Skeans, MD   650 mg at 07/15/22 1049   Or   acetaminophen (TYLENOL) suppository 650 mg  650 mg Rectal Q6H PRN Para Skeans, MD       carbidopa-levodopa (SINEMET IR) 25-100 MG per tablet immediate release 0.5 tablet  0.5 tablet Oral QHS Wynelle Cleveland, RPH       carbidopa-levodopa (SINEMET IR) 25-100 MG per tablet immediate release 1 tablet  1 tablet Oral BID Florina Ou V, MD   1 tablet at 07/15/22 1020   ceFAZolin (ANCEF) IVPB 1 g/50 mL premix  1 g Intravenous Q8H Loletha Grayer, MD 100 mL/hr at 07/15/22 1052 1 g at 07/15/22 1052   cyanocobalamin (VITAMIN B12) injection 1,000 mcg  1,000 mcg Intramuscular Daily Loletha Grayer, MD   1,000 mcg at 07/15/22 1049   DULoxetine (CYMBALTA) DR capsule 30 mg  30 mg Oral BID Para Skeans, MD   30 mg at 07/15/22 1050   gabapentin (NEURONTIN) capsule 100 mg  100 mg Oral TID Para Skeans, MD   100 mg at 07/15/22  1058   heparin injection 5,000 Units  5,000 Units Subcutaneous Q12H Irena Cords V, MD   5,000 Units at 07/15/22 1050   [START ON 07/16/2022] influenza vaccine adjuvanted (FLUAD) injection 0.5 mL  0.5 mL Intramuscular Tomorrow-1000 Gertha Calkin, MD       meclizine (ANTIVERT) tablet 12.5 mg  12.5 mg Oral TID PRN Alford Highland, MD       morphine (PF) 2 MG/ML injection 2 mg  2 mg Intravenous Q4H PRN Gertha Calkin, MD       sodium chloride flush (NS) 0.9 % injection 3 mL  3 mL Intravenous Q12H Gertha Calkin, MD   3 mL at 07/15/22 0218     Discharge Medications: Please see discharge summary for a list of discharge medications.  Relevant Imaging Results:  Relevant Lab Results:   Additional Information SS #: [id number]  Kemper Durie, RN

## 2022-07-15 NOTE — Assessment & Plan Note (Signed)
Continue gabapentin.

## 2022-07-15 NOTE — Assessment & Plan Note (Signed)
Patient withdrew her leg to Babinski but did have pain in the morning so.  Unable to lift her right leg up off the bed.  MRI of the brain was negative for stroke.  Neurological consultation to evaluate if there is any other neurological cause.  I did place on empiric antibiotics just in case cellulitis with subcutaneous edema seen on both legs on ultrasound.

## 2022-07-15 NOTE — TOC Initial Note (Signed)
Transition of Care [facility]) - Initial/Assessment Note    Patient Details  Name: Michele Brewer MRN: [id number] Date of Birth: 07/14/1951  [facility] CM/SW Contact:    Valente David, RN Phone Number: 07/15/2022, 3:25 PM  Clinical Narrative:                  Patient admitted from home where she lives with her mother and sister.  PCP is Dr. Ginette Pitman, obtains medications from mail order pharmacy via insurance plan.  Has walker and cane in the home.    Aware of recommendations to discharge to SNF for short term rehab, she agrees.  Choices offered, state she would like to go to [facility].  Discussed other options should [facility] not accept, she declines to have information sent elsewhere unless [facility] declines.  Will need EMS transport once medically ready for discharge.  Active PASRR status, FL2 completed, bed searched initiated to [facility] only per patient request.  Expected Discharge Plan: Skilled Nursing Facility Barriers to Discharge: Continued Medical Work up   Patient Goals and CMS Choice Patient states their goals for this hospitalization and ongoing recovery are:: SNF for short term rehab CMS Medicare.gov Compare Post Acute Care list provided to:: Patient Choice offered to / list presented to : Patient  Expected Discharge Plan and Services Expected Discharge Plan: [place] arrangements for the past 2 months: Single Family Home                                      Prior Living Arrangements/Services Living arrangements for the past 2 months: Single Family Home Lives with:: Siblings, Parents Patient language and need for interpreter reviewed:: Yes Do you feel safe going back to the place where you live?: Yes      Need for Family Participation in Patient Care: Yes (Comment) Care giver support system in place?: Yes (comment) Current home services: DME Criminal Activity/Legal Involvement Pertinent to Current Situation/Hospitalization: No - Comment  as needed  Activities of Daily Living Home Assistive Devices/Equipment: Gilford Rile (specify type) ADL Screening (condition at time of admission) Patient's cognitive ability adequate to safely complete daily activities?: Yes Is the patient deaf or have difficulty hearing?: No Does the patient have difficulty seeing, even when wearing glasses/contacts?: No Does the patient have difficulty concentrating, remembering, or making decisions?: No Patient able to express need for assistance with ADLs?: Yes Does the patient have difficulty dressing or bathing?: Yes Independently performs ADLs?: No Communication: Independent Dressing (OT): Dependent Is this a change from baseline?: Change from baseline, expected to last >3 days Grooming: Dependent Is this a change from baseline?: Change from baseline, expected to last >3 days Feeding: Dependent Is this a change from baseline?: Change from baseline, expected to last >3 days Bathing: Dependent Is this a change from baseline?: Change from baseline, expected to last >3 days Toileting: Dependent Is this a change from baseline?: Change from baseline, expected to last >3days In/Out Bed: Dependent Is this a change from baseline?: Change from baseline, expected to last >3 days Walks in Home: Dependent Is this a change from baseline?: Change from baseline, expected to last >3 days Does the patient have difficulty walking or climbing stairs?: Yes Weakness of Legs: Both Weakness of Arms/Hands: Both  Permission Sought/Granted Permission sought to share information with : Case Manager, Customer service manager Permission granted to share information with :  Yes, Verbal Permission Granted     Permission granted to share info w AGENCY: [facility]        Emotional Assessment   Attitude/Demeanor/Rapport: Engaged Affect (typically observed): Calm Orientation: : Oriented to Self, Oriented to Place, Oriented to  Time, Oriented to Situation   Psych  Involvement: No (comment)  Admission diagnosis:  Rigidity [R29.898] Muscle spasms of both lower extremities [M62.838] Patient Active Problem List   Diagnosis Date Noted   Right leg weakness 07/15/2022   Neuropathy 07/15/2022   Ambulatory dysfunction 07/14/2022   Hypomagnesemia 05/28/2022   Parkinson disease 05/25/2022   Depression 05/25/2022   Traumatic rhabdomyolysis ([place]) 02/04/2021   Fall 02/03/2021   PCP:  Tracie Harrier, MD Pharmacy:  No Pharmacies Listed    Social Determinants of Health (SDOH) Interventions Housing Interventions: Patient Refused  Readmission Risk Interventions     No data to display

## 2022-07-16 ENCOUNTER — Inpatient Hospital Stay: Payer: PPO

## 2022-07-16 DIAGNOSIS — F32A Depression, unspecified: Secondary | ICD-10-CM | POA: Diagnosis not present

## 2022-07-16 DIAGNOSIS — R29898 Other symptoms and signs involving the musculoskeletal system: Secondary | ICD-10-CM | POA: Diagnosis not present

## 2022-07-16 DIAGNOSIS — G20B2 Parkinson's disease with dyskinesia, with fluctuations: Secondary | ICD-10-CM | POA: Diagnosis not present

## 2022-07-16 DIAGNOSIS — R262 Difficulty in walking, not elsewhere classified: Secondary | ICD-10-CM | POA: Diagnosis not present

## 2022-07-16 DIAGNOSIS — E559 Vitamin D deficiency, unspecified: Secondary | ICD-10-CM

## 2022-07-16 DIAGNOSIS — E538 Deficiency of other specified B group vitamins: Secondary | ICD-10-CM

## 2022-07-16 LAB — VITAMIN D 25 HYDROXY (VIT D DEFICIENCY, FRACTURES): Vit D, 25-Hydroxy: 18.99 ng/mL — ABNORMAL LOW (ref 30–100)

## 2022-07-16 MED ORDER — THIAMINE HCL 100 MG/ML IJ SOLN
500.0000 mg | Freq: Three times a day (TID) | INTRAVENOUS | Status: AC
  Administered 2022-07-16 – 2022-07-18 (×6): 500 mg via INTRAVENOUS
  Filled 2022-07-16 (×6): qty 5

## 2022-07-16 MED ORDER — CARBIDOPA-LEVODOPA 25-100 MG PO TABS
1.5000 | ORAL_TABLET | Freq: Two times a day (BID) | ORAL | Status: DC
  Administered 2022-07-16 – 2022-07-18 (×5): 1.5 via ORAL
  Filled 2022-07-16 (×5): qty 2

## 2022-07-16 MED ORDER — VITAMIN D (ERGOCALCIFEROL) 1.25 MG (50000 UNIT) PO CAPS
50000.0000 [IU] | ORAL_CAPSULE | ORAL | Status: DC
  Administered 2022-07-16: 50000 [IU] via ORAL
  Filled 2022-07-16: qty 1

## 2022-07-16 MED ORDER — CARBIDOPA-LEVODOPA 25-100 MG PO TABS
1.5000 | ORAL_TABLET | Freq: Every day | ORAL | Status: DC
  Administered 2022-07-16 – 2022-07-17 (×2): 1.5 via ORAL
  Filled 2022-07-16 (×2): qty 2

## 2022-07-16 MED ORDER — FOLIC ACID 1 MG PO TABS
1.0000 mg | ORAL_TABLET | Freq: Every day | ORAL | Status: DC
  Administered 2022-07-16 – 2022-07-18 (×3): 1 mg via ORAL
  Filled 2022-07-16 (×3): qty 1

## 2022-07-16 NOTE — Assessment & Plan Note (Signed)
Oral supplementation

## 2022-07-16 NOTE — Assessment & Plan Note (Signed)
Received intramuscular B12 injections for 3 days then change over to oral

## 2022-07-16 NOTE — Assessment & Plan Note (Addendum)
Supplement vitamin D orally

## 2022-07-16 NOTE — Progress Notes (Signed)
  Progress Note   Patient: Michele Brewer [id number] DOB: [birth year] DOA: 07/14/2022     1 DOS: the patient was seen and examined on 07/16/2022     Assessment and Plan: * Right leg weakness MRI of the brain negative.  MRI of the cervical thoracic spine to explain this.  I did place on empiric antibiotics just in case cellulitis with subcutaneous edema seen on both legs on ultrasound.  We will get an x-ray of the right knee.  Continue working with physical therapy. Replace vitamin B12, vitamin D and folic acid. As per neurology recommended high-dose thiamine for few days.  Parkinson disease We will continue patient on Sinemet IR and Neurontin. Continue working with physical therapy.   Depression On Cymbalta  Folate deficiency Oral supplementation  Vitamin D deficiency Supplement vitamin D orally  Vitamin B12 deficiency Intramuscular B12 injections for 3 days then change over to oral  Neuropathy Continue gabapentin  Ambulatory dysfunction Physical therapy recommending rehab        Subjective: Patient still cannot move her right leg very well.  Able to flex and extend the ankle.  Admitted with weakness and inability to ambulate  Physical Exam: Vitals:   07/15/22 1932 07/16/22 0409 07/16/22 0500 07/16/22 0801  BP: (!) 98/52 130/67  (!) 118/59  Pulse: 65 69  (!) 59  Resp: 20 20    Temp: 97.7 F (36.5 C) 97.8 F (36.6 C)  97.6 F (36.4 C)  TempSrc: Oral Oral  Oral  SpO2: 99% 99%  99%  Weight:   68.5 kg   Height:       Physical Exam HENT:     Head: Normocephalic.     Mouth/Throat:     Pharynx: No oropharyngeal exudate.  Eyes:     General: Lids are normal.     Conjunctiva/sclera: Conjunctivae normal.  Cardiovascular:     Rate and Rhythm: Normal rate and regular rhythm.     Heart sounds: Normal heart sounds, S1 normal and S2 normal.  Pulmonary:     Breath sounds: No decreased breath sounds, wheezing, rhonchi or rales.  Abdominal:     Palpations: Abdomen  is soft.     Tenderness: There is no abdominal tenderness.  Musculoskeletal:     Right lower leg: Swelling present.     Left lower leg: Swelling present.  Skin:    General: Skin is warm.     Comments: Possible slight redness posterior right leg  Neurological:     Mental Status: She is alert and oriented to person, place, and time.     Comments: Unable to lift her right leg up off the bed but I saw her muscles prior Her leg.  Able to flex and extend at the right ankle and wiggle toes.     Data Reviewed: MRI cervical spine-right foraminal narrowing at C4-C5.  In speaking with neurology this would not cause her right leg weakness. Vitamin B12 0.45, vitamin D18.99, folate 5 2  Disposition: Status is: Inpatient Remains inpatient appropriate because: Still working up right leg weakness.  Patient will need rehab.  Neurology recommended IV thiamine high-dose.  Planned Discharge Destination: Rehab    Time spent: 27 minutes  Author: Loletha Grayer, MD 07/16/2022 2:33 PM  For on call review [email or web address].

## 2022-07-16 NOTE — Progress Notes (Signed)
Neurology Progress Note  Patient ID: Michele Brewer is a 71 y.o. with PMHx of  has a past medical history of Depression and Parkinson disease.  Subjective: Brighter affect today, friend is visiting at bedside and asked them to step out for examination and further discussion with the patient. She reports she was having some back pain for which morphine has been helpful and she feels comfortable at this time She reports she is willing to try an increased dose of Sinemet today  Exam: Vitals:   07/16/22 0409 07/16/22 0801  BP: 130/67 (!) 118/59  Pulse: 69 (!) 59  Resp: 20   Temp: 97.8 F (36.6 C) 97.6 F (36.4 C)  SpO2: 99% 99%   Gen: In bed, comfortable  Resp: non-labored breathing, no grossly audible wheezing Cardiac: Perfusing extremities well  Abd: soft, nt  Neuro: MS: Awake, alert, oriented to person, place, time and situation CN: Face symmetric, EOMI, pupils equal Motor: Remains 5/5 throughout the upper extremities and in bilateral knee flexion and extension, still does not give antigravity effort with the bilateral hip flexors, but does appear to be giving better effort, estimate 2/5  MRI C and T-spine personally reviewed, agree with radiology: 1. C4-C5 severe right and mild left neural foraminal narrowing. 2. C6-C7 mild-to-moderate left neural foraminal narrowing. 3. C3-C4 mild bilateral neural foraminal narrowing. 4. No spinal canal stenosis in the cervical or thoracic spine. 5. Trace pleural effusions.  Pertinent Labs: Folate low at 5.2, vitamin D low at 18.99  Thiamine level drawn on 10/28 at 1900 hrs.  Impression: Multifactorial gait difficulty secondary to Parkinson's disease as well as nutritional myelopathy/neuropathy  Recommendations: -Increase Sinemet to 1.5 tablets 3 times daily (ordered) -Empiric high-dose thiamine 500 every 8 hours pending levels, may be discontinued if the level results normal (will be accurate as it was collected prior to starting  supplementation) -Appreciate vitamin D supplementation and B12 supplementation per primary team -Nutrition consult requested  -Neurology will follow along to confirm she is tolerating the increased Sinemet dose  Lesleigh Noe MD-PhD [facility] [phone]  [facility] coverage for [facility] is from 8 AM to 4 AM in-house and 4 PM to 8 PM by telephone/video. 8 PM to 8 AM emergent questions or overnight urgent questions should be addressed to Teleneurology On-call or [facility] neurohospitalist; contact information can be found on AMION

## 2022-07-17 DIAGNOSIS — R29898 Other symptoms and signs involving the musculoskeletal system: Secondary | ICD-10-CM | POA: Diagnosis not present

## 2022-07-17 DIAGNOSIS — M62838 Other muscle spasm: Secondary | ICD-10-CM

## 2022-07-17 DIAGNOSIS — G20B2 Parkinson's disease with dyskinesia, with fluctuations: Secondary | ICD-10-CM | POA: Diagnosis not present

## 2022-07-17 DIAGNOSIS — R262 Difficulty in walking, not elsewhere classified: Secondary | ICD-10-CM | POA: Diagnosis not present

## 2022-07-17 DIAGNOSIS — E538 Deficiency of other specified B group vitamins: Secondary | ICD-10-CM | POA: Diagnosis not present

## 2022-07-17 MED ORDER — VITAMIN B-12 1000 MCG PO TABS
1000.0000 ug | ORAL_TABLET | Freq: Every day | ORAL | Status: DC
  Administered 2022-07-18: 1000 ug via ORAL
  Filled 2022-07-17: qty 1

## 2022-07-17 MED ORDER — ADULT MULTIVITAMIN W/MINERALS CH
1.0000 | ORAL_TABLET | Freq: Every day | ORAL | Status: DC
  Administered 2022-07-18: 1 via ORAL
  Filled 2022-07-17: qty 1

## 2022-07-17 MED ORDER — NEPRO/CARBSTEADY PO LIQD
237.0000 mL | Freq: Three times a day (TID) | ORAL | Status: DC
  Administered 2022-07-17 – 2022-07-18 (×2): 237 mL via ORAL

## 2022-07-17 NOTE — Progress Notes (Addendum)
Initial Nutrition Assessment  DOCUMENTATION CODES:   Not applicable  INTERVENTION:   Nepro Shake po TID, each supplement provides 425 kcal and 19 grams protein   MVI po daily   Liberalize diet   Vitamins B1, B3, B6, A, E, copper and zinc pending   SLP evaluation   NUTRITION DIAGNOSIS:   Increased nutrient needs related to catabolic illness (Parkinson's disease) as evidenced by estimated needs.  GOAL:   Patient will meet greater than or equal to 90% of their needs  MONITOR:   PO intake, Supplement acceptance, Labs, Weight trends, Skin, I & O's  REASON FOR ASSESSMENT:   Consult Assessment of nutrition requirement/status  ASSESSMENT:   71 y/o female with h/o Parkison's disease, MDD and neuropathy who is admitted with weakness and ambulatory dysfunction.  RD working remotely.  Spoke with pt via phone. Pt reports good appetite and oral intake pta and in hospital. Pt is documented to be eating 100% of meals. Pt reports a 60lb weight loss but reports that this has occurred over many years and that she stays around 145lbs now. Per chart, pt has been weight stable for at least 2 years. Pt with several nutrient deficiencies noted. Pt reports that she does not take any vitamins or nutritional supplements at home. Pt denies any past history of abdominal surgeries or gastric bypass; suspect deficiencies are in relation to Sinemet use. Pt does have several vitamin labs pending. Vitamins are being supplemented per MD. RD discussed with pt the importance of adequate nutrition needed to preserve lean muscle. Pt is willing to drink vanilla supplements in hospital. Recommended daily MVI. Pt will also need to continue vitamin supplementation and follow up to have labs rechecked in ~30-60 days. RD will add supplements and MVI. RD will also liberalize pt's diet. Unsure why pt is on nectar thick liquids; pt reports normal diet at home. SLP evaluation is pending. RD will follow up to obtain  nutrition related exam.    Medications reviewed and include: sinemet, folic acid, heparin, ergocalciferol, cefazolin, thiamine   Labs reviewed: K 3.6 wnl Folate 5.2(L), vitamin D 18.99(L), B12 145(L)- 10/28  NUTRITION - FOCUSED PHYSICAL EXAM: Unable to perform at this time   Diet Order:   Diet Order             Diet regular Room service appropriate? Yes; Fluid consistency: Nectar Thick  Diet effective now                  EDUCATION NEEDS:   Education needs have been addressed  Skin:  Skin Assessment: Reviewed RN Assessment  Last BM:  10/25  Height:   Ht Readings from Last 1 Encounters:  07/15/22 5\' 4"  (1.626 m)    Weight:   Wt Readings from Last 1 Encounters:  07/17/22 65.1 kg    Ideal Body Weight:  54.5 kg  BMI:  Body mass index is 24.63 kg/m.  Estimated Nutritional Needs:   Kcal:  1500-1700kcal/day  Protein:  75-85g/day  Fluid:  1.4-1.6L/day  Koleen Distance MS, RD, LDN Please refer to [facility] for RD and/or RD on-call/weekend/after hours pager

## 2022-07-17 NOTE — Evaluation (Signed)
Clinical/Bedside Swallow Evaluation Patient Details  Name: Michele Brewer MRN: [id number] Date of Birth: 1951-05-19  Today's Date: 07/17/2022 Time: SLP Start Time (ACUTE ONLY): 1555 SLP Stop Time (ACUTE ONLY): 1640 SLP Time Calculation (min) (ACUTE ONLY): 45 min  Past Medical History:  Past Medical History:  Diagnosis Date   Depression    Parkinson disease    Past Surgical History:  Past Surgical History:  Procedure Laterality Date   CESAREAN SECTION     HPI:  Pt  is a 71 y.o. female with a history of depression, Parkinson's disease and nontraumatic rhabdo, presents to the emergency department with concern for shakiness, muscle rigidity and feeling as though she is frozen when trying to take a step.  Patient states that it took her 5 minutes to take a step while trying to leave her bathroom earlier today.  She states that the symptoms started approximately a week ago and seemed initially to come and go.  Her symptoms have become more frequent and feel constant at this current time, prompting her to come to the emergency department.  She states that she has new lower extremity swelling that has occurred for the past several months.  She denies associated shortness of breath, chest tightness, increased pillow usage or paroxysmal nocturnal dyspnea. She denies nausea, vomiting or abdominal pain.  Neurology is following and adjusting her Sinemet(for Parkinson's Dis.).  CXR: Negative.  MRI: Negative.    Assessment / Plan / Recommendation  Clinical Impression   Pt seen for BSE today. She was alert/awake; A/O x4. Followed all instructions appropriately.  On RA; afebrile. WBC WNL.   Pt appears to present w/ adequate oropharyngeal phase swallow function w/ No gross oropharyngeal phase dysphagia noted, No neuromuscular deficits noted to impact general bolus management and swallowing. Pt consumed po trials w/ No immediate, overt, clinical s/s of aspiration during po trials.  Pt appears at reduced risk  for aspiration following general aspiration precautions.   However, pt does have challenging factors that could impact her oropharyngeal swallowing to include fatigue/weakness and Parkinson's Dis. These factors can increase risk for dysphagia, aspiration as well as decreased oral intake overall.   During po trials, pt consumed all consistencies w/ no overt coughing, decline in vocal quality, or change in respiratory presentation during/post trials. Oral phase appeared [facility] w/ timely bolus management and control of bolus propulsion for A-P transfer for swallowing. Oral clearing achieved w/ all trial consistencies -- moistened, soft foods given. OM Exam appeared [facility] w/ no unilateral weakness noted; adequate strength w/ direct movements but min weaker in connected/rapid lingual movement - suspect impact from Parkinson's Dis. Speech intelligible but w/ low volume - pt stated she had Speech Therapy at [facility] including Pulmonary exs. Encouraged her to continue this even though she was discharged to home - maybe have Home Health f/u from the Discharge for Speech Therapy for Speech/Exs. Pt fed self w/ min setup support.  Recommend continue a fairly Regular consistency diet w/ well-Cut meats/foods, moistened foods; Thin liquids. Recommend general aspiration precautions, Pills WHOLE in Puree for safer, easier swallowing as pt desires d/t stating "some pills stick to the roof of my mouth". Rest Breaks during meals for conservation of energy. Education given on Pills in Puree for cohesion during swallowing; food consistencies and easy to eat options; general aspiration precautions to pt. NSG to reconsult if any new needs arise. NSG updated, agreed. MD updated. Recommend Dietician f/u for support. SLP Visit Diagnosis: Dysphagia, unspecified (R13.10)    Aspiration  Risk   (reduced folloiwing general precautions)    Diet Recommendation    a fairly Regular consistency diet w/ well-Cut meats/foods, moistened foods;  Thin liquids. Recommend general aspiration precautions. Rest Breaks during meals for conservation of energy.   Medication Administration: Whole meds with puree (as needed for ease/safety of swallowing)    Other  Recommendations Recommended Consults:  (Dietician f/u for support) Oral Care Recommendations: Oral care BID;Oral care before and after PO;Patient independent with oral care (setup support) Other Recommendations:  (n/a)    Recommendations for follow up therapy are one component of a multi-disciplinary discharge planning process, led by the attending physician.  Recommendations may be updated based on patient status, additional functional criteria and insurance authorization.  Follow up Recommendations No SLP follow up      Assistance Recommended at Discharge Set up Supervision/Assistance (at meals as needed)  Functional Status Assessment Patient has had a recent decline in their functional status and demonstrates the ability to make significant improvements in function in a reasonable and predictable amount of time.  Frequency and Duration  (n/a)   (n/a)       Prognosis Prognosis for Safe Diet Advancement: Good Barriers to Reach Goals: Time post onset;Severity of deficits Barriers/Prognosis Comment: Parksinson's Dis.; deconditioned      Swallow Study   General Date of Onset: 07/14/22 HPI: Pt  is a 71 y.o. female with a history of depression, Parkinson's disease and nontraumatic rhabdo, presents to the emergency department with concern for shakiness, muscle rigidity and feeling as though she is frozen when trying to take a step.  Patient states that it took her 5 minutes to take a step while trying to leave her bathroom earlier today.  She states that the symptoms started approximately a week ago and seemed initially to come and go.  Her symptoms have become more frequent and feel constant at this current time, prompting her to come to the emergency department.  She states that she  has new lower extremity swelling that has occurred for the past several months.  She denies associated shortness of breath, chest tightness, increased pillow usage or paroxysmal nocturnal dyspnea. She denies nausea, vomiting or abdominal pain.  Neurology is following and adjusting her Sinemet(for Parkinson's Dis.).  CXR: Negative.  MRI: Negative. Type of Study: Bedside Swallow Evaluation Previous Swallow Assessment: none Diet Prior to this Study: Regular;Nectar-thick liquids (at admit) Temperature Spikes Noted: No (wbc 5.6) Respiratory Status: Room air History of Recent Intubation: No Behavior/Cognition: Alert;Cooperative;Pleasant mood Oral Cavity Assessment: Within Functional Limits Oral Care Completed by SLP: Yes Oral Cavity - Dentition: Adequate natural dentition;Missing dentition (few) Vision: Functional for self-feeding Self-Feeding Abilities: Able to feed self;Needs set up Patient Positioning: Upright in chair (needed supported positoning) Baseline Vocal Quality: Low vocal intensity (mildly decreased articulation of speech -- Baseline for pt(was seen by ST services at recent Rehab admit to [facility])) Volitional Cough: Strong Volitional Swallow: Able to elicit    Oral/Motor/Sensory Function Overall Oral Motor/Sensory Function: Within functional limits (for individual movements; min decreased connected/rapid movements)   Ice Chips Ice chips: Not tested   Thin Liquid Thin Liquid: Within functional limits Presentation: Self Fed;Straw (10+ trials; consecutive sips)    Nectar Thick Nectar Thick Liquid: Not tested   Honey Thick Honey Thick Liquid: Not tested   Puree Puree: Within functional limits Presentation: Self Fed;Spoon (3+ trials)   Solid     Solid: Not tested Other Comments: declined; but denied any significant deficits w/ foods at her meals(chicken  is a little tougher to chew she stated)        Jerilynn Som, MS, CCC-SLP Speech Language Pathologist Rehab Services; [facility]  [place] [phone] (ascom) Akshar Starnes 07/17/2022,4:41 PM

## 2022-07-17 NOTE — TOC Progression Note (Signed)
Transition of Care [facility]) - Progression Note    Patient Details  Name: Michele Brewer MRN: [id number] Date of Birth: May 17, 1951  [facility] CM/SW Contact  Beverly Sessions, RN Phone Number: 07/17/2022, 3:38 PM  Clinical Narrative:     Received call from Tammy at [place] has been approved for [facility] for 7 days.  Auth number [id number].  Tammy at [facility] notified  EMS auth has went to medical review   Expected Discharge Plan: [place] Barriers to Discharge: Continued Medical Work up  Expected Discharge Plan and Services Expected Discharge Plan: [place] arrangements for the past 2 months: Single Family Home                                       Social Determinants of Health (SDOH) Interventions Housing Interventions: Patient Refused  Readmission Risk Interventions     No data to display

## 2022-07-17 NOTE — Progress Notes (Signed)
She reports that she has had no problems with the increased dose of Sinemet.  On exam, no increased tone, no signs of cogwheeling.  I agree with Dr. Curly [place], likely multifactorial gait disturbance including nutritional component and parkinsonism.  At this point, I think focusing on nutrition and physical therapy/Occupational Therapy would be prudent.  If she is tolerating her increased dose, would continue 1.5 tab 3 times daily assessment.  Neurology will continue to be available as needed, please call with further questions or concerns.  Roland Rack, MD [facility] [phone]  If 7pm- 7am, please page neurology on call as listed in [place].

## 2022-07-17 NOTE — Progress Notes (Signed)
  Progress Note   Patient: Michele Brewer ERX:[id number] DOB: 1951/03/19 DOA: 07/14/2022     2 DOS: the patient was seen and examined on 07/17/2022     Assessment and Plan: * Right leg weakness MRI of the brain negative.  MRI of the cervical thoracic spine does not explain her weakness.  I did place on empiric antibiotics just in case cellulitis with subcutaneous edema seen on both legs on ultrasound.  Right knee x-ray negative.  Continue working with physical therapy.  Patient's strength has improved today but still weak.  Continue P50, vitamin D, folic acid and thiamine replacement.  Parkinson disease We will continue patient on Sinemet IR and Neurontin. Continue working with physical therapy.   Depression On Cymbalta  Folate deficiency Oral supplementation  Vitamin D deficiency Supplement vitamin D orally  Vitamin B12 deficiency Intramuscular B12 injections for 3 days then change over to oral  Neuropathy Continue gabapentin  Ambulatory dysfunction Physical therapy recommending rehab        Subjective: Patient able to wiggle her toes and flex up and down with her ankle.  Patient able to straight leg raise today.  Came in with right leg weakness  Physical Exam: Vitals:   07/17/22 0430 07/17/22 0441 07/17/22 0833 07/17/22 1629  BP: 121/64  130/66   Pulse: 67  (!) 59 69  Resp: 18  16 16   Temp: 97.6 F (36.4 C)  97.8 F (36.6 C)   TempSrc: Oral  Oral   SpO2: 100%  98% 100%  Weight:  65.1 kg    Height:       Physical Exam HENT:     Head: Normocephalic.     Mouth/Throat:     Pharynx: No oropharyngeal exudate.  Eyes:     General: Lids are normal.     Conjunctiva/sclera: Conjunctivae normal.  Cardiovascular:     Rate and Rhythm: Normal rate and regular rhythm.     Heart sounds: Normal heart sounds, S1 normal and S2 normal.  Pulmonary:     Breath sounds: No decreased breath sounds, wheezing, rhonchi or rales.  Abdominal:     Palpations: Abdomen is soft.      Tenderness: There is no abdominal tenderness.  Musculoskeletal:     Right lower leg: Swelling present.     Left lower leg: Swelling present.  Skin:    General: Skin is warm.  Neurological:     Mental Status: She is alert and oriented to person, place, and time.     Comments: Slightly able to straight leg raise with the right leg today.  Able to flex up and down with her ankle on the right foot.  Able to wiggle toes.     Data Reviewed: Right knee x-ray negative  Disposition: Status is: Inpatient Remains inpatient appropriate because: Awaiting insurance authorization for rehab  Planned Discharge Destination: Rehab    Time spent: 41minutes  Author: Loletha Grayer, MD 07/17/2022 5:52 PM  For on call review [email or web address].

## 2022-07-17 NOTE — TOC Progression Note (Signed)
Transition of Care [facility]) - Progression Note    Patient Details  Name: Michele Brewer MRN: [id number] Date of Birth: 1951/05/08  [facility] CM/SW Contact  Beverly Sessions, RN Phone Number: 07/17/2022, 11:42 AM  Clinical Narrative:     Per Tam[facility] at [facility] they can offer a bed but patient has used 18 of her 20 days and will owe $1288 for 7 days prior to admission Patient aware and would like to accept.  Tammy at [facility] notified Accepted in [place] Per MD anticipated patient will be ready for DC tomorrow Spoke with Marlowe Kays at [facility].  Alma Downs for SNF and EMS through [facility]    Expected Discharge Plan: [place] Barriers to Discharge: Continued Medical Work up  Expected Discharge Plan and Services Expected Discharge Plan: [place] arrangements for the past 2 months: Single Family Home                                       Social Determinants of Health (SDOH) Interventions Housing Interventions: Patient Refused  Readmission Risk Interventions     No data to display

## 2022-07-17 NOTE — Progress Notes (Signed)
Physical Therapy Treatment Patient Details Name: Michele Brewer MRN: [id number] DOB: 1951-01-13 Today's Date: 07/17/2022   History of Present Illness Michele Brewer is an 71 y.o. female  with muscle tightness and generalized weakness.   She can't take care of herself.  It is progressively gotten worse but the patient decided to come to the hospital tonight.  She is not able to ambulate or move her legs since yesterday. Pt describes weakness or being frozen in place for 5 mins. Till day befroe yesterday pt was walking with cane.    PT Comments    Pt received in bed & agreeable to tx. Pt requires extra time & use of hospital bed features to complete supine>sit but is able to do so without physical assistance from PT, using BUE to move RLE to EOB. Pt completes STS with min assist & requires mod assist for step pivot to recliner. Pt reports feeling "stuck" & requires manual facilitation for weight shifting to allow her to take steps with RW. Pt ambulates into hallway with RW & min assist with cuing for "big" steps with fair improvement in gait pattern. Pt with continent void on BSC at end of session, managing clothing with CGA for standing balance. Pt endorses dizziness that comes & goes throughout session but reports this has been ongoing for a few years. Pt reports she's been educated on "crystals in her ears" but has never had formal testing for BPPV (no nystagmus observed during session). Will make note for BPPV assessment prior to getting OOB during next PT session. Continue to recommend STR upon d/c.    Recommendations for follow up therapy are one component of a multi-disciplinary discharge planning process, led by the attending physician.  Recommendations may be updated based on patient status, additional functional criteria and insurance authorization.  Follow Up Recommendations  Skilled nursing-short term rehab (<3 hours/day) Can patient physically be transported by private vehicle: Yes    Assistance Recommended at Discharge Frequent or constant Supervision/Assistance  Patient can return home with the following A little help with walking and/or transfers;A little help with bathing/dressing/bathroom;Assistance with cooking/housework;Assist for transportation;Help with stairs or ramp for entrance   Equipment Recommendations  None recommended by PT    Recommendations for Other Services OT consult     Precautions / Restrictions Precautions Precautions: Fall Restrictions Weight Bearing Restrictions: No     Mobility  Bed Mobility Overal bed mobility: Needs Assistance Bed Mobility: Supine to Sit     Supine to sit: Supervision, HOB elevated     General bed mobility comments: Significantly extra time to come to sitting EOB, HOB elevated, bed rails, uses UE to move RLE to EOB.    Transfers Overall transfer level: Needs assistance Equipment used: Rolling walker (2 wheels) Transfers: Sit to/from Stand, Bed to chair/wheelchair/BSC Sit to Stand: Min assist (Educational cuing re: hand placement during STS) Stand pivot transfers: Min guard (recliner<>BSC) Step pivot transfers: Mod assist (initially required mod assist with PT facilitating weight shifting L<>R to allow her to advance RLE)            Ambulation/Gait Ambulation/Gait assistance: Min assist Gait Distance (Feet): 50 Feet Assistive device: Rolling walker (2 wheels) Gait Pattern/deviations: Decreased step length - right, Decreased step length - left, Decreased dorsiflexion - right, Decreased dorsiflexion - left, Decreased stride length Gait velocity: decreased     General Gait Details: Initially pt with shuffled gait pattern, decreased heel strike BLE. Cuing for increased step width & step length, pt with fair return  demo of step length & heel strike. Intermittent standing breaks 2/2 dizziness.   Stairs             Wheelchair Mobility    Modified Rankin (Stroke Patients Only)       Balance  Overall balance assessment: Needs assistance Sitting-balance support: Feet supported, Bilateral upper extremity supported Sitting balance-Leahy Scale: Fair     Standing balance support: Bilateral upper extremity supported, During functional activity, Reliant on assistive device for balance Standing balance-Leahy Scale: Fair Standing balance comment: R lateral lean in standing with decreased awareness & ability to correct even when using mirror in room for visual feedback.                            Cognition Arousal/Alertness: Awake/alert Behavior During Therapy: WFL for tasks assessed/performed, Flat affect Overall Cognitive Status: Within Functional Limits for tasks assessed                                          Exercises      General Comments General comments (skin integrity, edema, etc.): Pt with continent void on [facility] & performs peri hygiene with CGA for standing balance.      Pertinent Vitals/Pain Pain Assessment Pain Assessment: No/denies pain    Home Living                          Prior Function            PT Goals (current goals can now be found in the care plan section) Acute Rehab PT Goals Patient Stated Goal: " I want to feel better." PT Goal Formulation: With patient Time For Goal Achievement: 07/29/22 Potential to Achieve Goals: Fair Progress towards PT goals: Progressing toward goals    Frequency    Min 2X/week      PT Plan Current plan remains appropriate    Co-evaluation              AM-PAC PT "6 Clicks" Mobility   Outcome Measure  Help needed turning from your back to your side while in a flat bed without using bedrails?: None Help needed moving from lying on your back to sitting on the side of a flat bed without using bedrails?: A Lot Help needed moving to and from a bed to a chair (including a wheelchair)?: A Lot Help needed standing up from a chair using your arms (e.g., wheelchair or  bedside chair)?: A Little Help needed to walk in hospital room?: A Lot Help needed climbing 3-5 steps with a railing? : A Lot 6 Click Score: 15    End of Session Equipment Utilized During Treatment: Gait belt Activity Tolerance: Patient tolerated treatment well Patient left: in chair;with chair alarm set Nurse Communication: Mobility status PT Visit Diagnosis: Unsteadiness on feet (R26.81);Other abnormalities of gait and mobility (R26.89);History of falling (Z91.81);Muscle weakness (generalized) (M62.81);Dizziness and giddiness (R42);Other symptoms and signs involving the nervous system (R29.898);BPPV     Time: 5956-3875 PT Time Calculation (min) (ACUTE ONLY): 38 min  Charges:  $Gait Training: 8-22 mins $Therapeutic Activity: 23-37 mins                     Lavone Nian, PT, DPT 07/17/22, 12:37 PM   Waunita Schooner 07/17/2022, 12:37 PM

## 2022-07-18 DIAGNOSIS — D519 Vitamin B12 deficiency anemia, unspecified: Secondary | ICD-10-CM | POA: Diagnosis not present

## 2022-07-18 DIAGNOSIS — G20C Parkinsonism, unspecified: Secondary | ICD-10-CM | POA: Diagnosis not present

## 2022-07-18 DIAGNOSIS — F32A Depression, unspecified: Secondary | ICD-10-CM | POA: Diagnosis not present

## 2022-07-18 DIAGNOSIS — K5900 Constipation, unspecified: Secondary | ICD-10-CM | POA: Diagnosis not present

## 2022-07-18 DIAGNOSIS — K219 Gastro-esophageal reflux disease without esophagitis: Secondary | ICD-10-CM | POA: Diagnosis not present

## 2022-07-18 DIAGNOSIS — R2681 Unsteadiness on feet: Secondary | ICD-10-CM | POA: Diagnosis not present

## 2022-07-18 DIAGNOSIS — J309 Allergic rhinitis, unspecified: Secondary | ICD-10-CM | POA: Diagnosis not present

## 2022-07-18 DIAGNOSIS — R262 Difficulty in walking, not elsewhere classified: Secondary | ICD-10-CM | POA: Diagnosis not present

## 2022-07-18 DIAGNOSIS — E538 Deficiency of other specified B group vitamins: Secondary | ICD-10-CM | POA: Diagnosis not present

## 2022-07-18 DIAGNOSIS — M792 Neuralgia and neuritis, unspecified: Secondary | ICD-10-CM | POA: Diagnosis not present

## 2022-07-18 DIAGNOSIS — G20B2 Parkinson's disease with dyskinesia, with fluctuations: Secondary | ICD-10-CM | POA: Diagnosis not present

## 2022-07-18 DIAGNOSIS — T796XXS Traumatic ischemia of muscle, sequela: Secondary | ICD-10-CM | POA: Diagnosis not present

## 2022-07-18 DIAGNOSIS — E559 Vitamin D deficiency, unspecified: Secondary | ICD-10-CM | POA: Diagnosis not present

## 2022-07-18 DIAGNOSIS — R4789 Other speech disturbances: Secondary | ICD-10-CM | POA: Diagnosis not present

## 2022-07-18 DIAGNOSIS — M6281 Muscle weakness (generalized): Secondary | ICD-10-CM | POA: Diagnosis not present

## 2022-07-18 DIAGNOSIS — E569 Vitamin deficiency, unspecified: Secondary | ICD-10-CM | POA: Diagnosis not present

## 2022-07-18 DIAGNOSIS — T796XXD Traumatic ischemia of muscle, subsequent encounter: Secondary | ICD-10-CM | POA: Diagnosis not present

## 2022-07-18 DIAGNOSIS — M79604 Pain in right leg: Secondary | ICD-10-CM | POA: Diagnosis not present

## 2022-07-18 DIAGNOSIS — G629 Polyneuropathy, unspecified: Secondary | ICD-10-CM | POA: Diagnosis not present

## 2022-07-18 DIAGNOSIS — M6259 Muscle wasting and atrophy, not elsewhere classified, multiple sites: Secondary | ICD-10-CM | POA: Diagnosis not present

## 2022-07-18 DIAGNOSIS — W19XXXD Unspecified fall, subsequent encounter: Secondary | ICD-10-CM | POA: Diagnosis not present

## 2022-07-18 DIAGNOSIS — Z736 Limitation of activities due to disability: Secondary | ICD-10-CM | POA: Diagnosis not present

## 2022-07-18 DIAGNOSIS — N390 Urinary tract infection, site not specified: Secondary | ICD-10-CM | POA: Diagnosis not present

## 2022-07-18 DIAGNOSIS — R29898 Other symptoms and signs involving the musculoskeletal system: Secondary | ICD-10-CM | POA: Diagnosis not present

## 2022-07-18 MED ORDER — VITAMIN B-1 100 MG PO TABS: 100.0000 mg | ORAL_TABLET | Freq: Every day | ORAL | 0 refills | Status: DC

## 2022-07-18 MED ORDER — VITAMIN D (ERGOCALCIFEROL) 1.25 MG (50000 UNIT) PO CAPS: 50000.0000 [IU] | ORAL_CAPSULE | ORAL | 0 refills | Status: DC

## 2022-07-18 MED ORDER — LACTULOSE 10 GM/15ML PO SOLN: 20.0000 g | Freq: Once | ORAL | Status: DC

## 2022-07-18 MED ORDER — ACETAMINOPHEN 325 MG PO TABS: 650.0000 mg | ORAL_TABLET | Freq: Four times a day (QID) | ORAL | Status: AC | PRN

## 2022-07-18 MED ORDER — CARBIDOPA-LEVODOPA 25-100 MG PO TABS: 1.5000 | ORAL_TABLET | Freq: Two times a day (BID) | ORAL | Status: DC

## 2022-07-18 MED ORDER — NEPRO/CARBSTEADY PO LIQD: 237.0000 mL | Freq: Three times a day (TID) | ORAL | 0 refills | Status: DC

## 2022-07-18 MED ORDER — CYANOCOBALAMIN 1000 MCG PO TABS: 1000.0000 ug | ORAL_TABLET | Freq: Every day | ORAL | 0 refills | Status: DC

## 2022-07-18 MED ORDER — FLEET ENEMA 7-19 GM/118ML RE ENEM
1.0000 | ENEMA | Freq: Once | RECTAL | Status: AC
  Administered 2022-07-18: 1 via RECTAL

## 2022-07-18 MED ORDER — POLYETHYLENE GLYCOL 3350 17 G PO PACK
17.0000 g | PACK | Freq: Every day | ORAL | Status: DC
  Administered 2022-07-18: 17 g via ORAL
  Filled 2022-07-18: qty 1

## 2022-07-18 MED ORDER — ADULT MULTIVITAMIN W/MINERALS CH: 1.0000 | ORAL_TABLET | Freq: Every day | ORAL | 0 refills | Status: AC

## 2022-07-18 MED ORDER — CEPHALEXIN 500 MG PO CAPS
500.0000 mg | ORAL_CAPSULE | Freq: Three times a day (TID) | ORAL | Status: DC
  Administered 2022-07-18: 500 mg via ORAL
  Filled 2022-07-18: qty 1

## 2022-07-18 MED ORDER — POLYETHYLENE GLYCOL 3350 17 G PO PACK: 17.0000 g | PACK | Freq: Every day | ORAL | 0 refills | Status: AC | PRN

## 2022-07-18 MED ORDER — DULOXETINE HCL 30 MG PO CPEP: 30.0000 mg | ORAL_CAPSULE | Freq: Two times a day (BID) | ORAL | 0 refills | Status: DC

## 2022-07-18 MED ORDER — THIAMINE MONONITRATE 100 MG PO TABS: 100.0000 mg | ORAL_TABLET | Freq: Every day | ORAL | Status: DC

## 2022-07-18 MED ORDER — CEPHALEXIN 500 MG PO CAPS: 500.0000 mg | ORAL_CAPSULE | Freq: Three times a day (TID) | ORAL | 0 refills | Status: AC

## 2022-07-18 MED ORDER — FOLIC ACID 1 MG PO TABS: 1.0000 mg | ORAL_TABLET | Freq: Every day | ORAL | 0 refills | Status: AC

## 2022-07-18 MED ORDER — CARBIDOPA-LEVODOPA 25-100 MG PO TABS: 1.5000 | ORAL_TABLET | Freq: Every day | ORAL | Status: DC

## 2022-07-18 NOTE — Progress Notes (Signed)
Mobility Specialist - Progress Note   07/18/22 1619  Mobility  Activity Stood at bedside  Level of Assistance Standby assist, set-up cues, supervision of patient - no hands on  Assistive Device Front wheel walker  Activity Response Tolerated well  Mobility Referral Yes  $Mobility charge 1 Mobility   Pt supine upon entry, utilizing RA. Pt transferred to EOB and stood at the bedside. Pt donned undergarments and returned to bed. Pt left supine with needs within reach, RN present at bedside.   Candie Mile Mobility Specialist 07/18/22 4:22 PM

## 2022-07-18 NOTE — TOC Transition Note (Signed)
Transition of Care [facility]) - CM/SW Discharge Note   Patient Details  Name: Tametria Aho MRN: [id number] Date of Birth: Apr 08, 1951  Transition of Care [facility] CM/SW Contact:  Beverly Sessions, RN Phone Number: 07/18/2022, 2:11 PM   Clinical Narrative:     Patient will DC MB:WGYK Anticipated DC date:07/18/22 Transport by: Patient's friend will transport at 6pm  Per MD patient ready for DC to . RN, patient, and facility notified of DC. Discharge Summary sent to facility. RN given number for report.   TOC signing off.  Isaias Cowman [facility] [phone]     Barriers to Discharge: Continued Medical Work up   Patient Goals and CMS Choice Patient states their goals for this hospitalization and ongoing recovery are:: SNF for short term rehab CMS Medicare.gov Compare Post Acute Care list provided to:: Patient Choice offered to / list presented to : Patient  Discharge Placement                       Discharge Plan and Services                                     Social Determinants of Health (SDOH) Interventions Housing Interventions: Patient Refused   Readmission Risk Interventions     No data to display

## 2022-07-18 NOTE — Progress Notes (Signed)
Mobility Specialist - Progress Note   07/18/22 1613  Mobility  Activity Stood at bedside;Transferred to/from [facility]  Level of Assistance Minimal assist, patient does 75% or more  Assistive Device Front wheel walker  Distance Ambulated (ft) 4 ft  Activity Response Tolerated well  Mobility Referral Yes  $Mobility charge 1 Mobility   Pt supine upon entry, utilizing RA. Pt completed bed mob and STS to RW with supervision. Pt ambulated to [facility], tolerated well. Pt completed ADLs such as brushing teeth and washing face. Pt is given a seated bath but Chief Strategy Officer and MS, dons gown and returns to bed. Pt left supine with needs within reach, no complaints.   Candie Mile / AP Mobility Specialist 07/18/22 4:19 PM

## 2022-07-18 NOTE — Assessment & Plan Note (Signed)
Patient had bowel movement with enema.  As needed MiraLAX prescribed.

## 2022-07-18 NOTE — Care Management Important Message (Signed)
Important Message  Patient Details  Name: Michele Brewer MRN: [id number] Date of Birth: [birth year]   Medicare Important Message Given:  N/A - LOS <3 / Initial given by admissions     Dannette Barbara 07/18/2022, 8:34 AM

## 2022-07-18 NOTE — Hospital Course (Signed)
71 year old female with past medical history of Parkinson's disease.  Patient presented to the hospital with right leg weakness and difficulty with ambulating.  She was unable to move her right leg at all initially.  MRI of the brain was negative for stroke.  MRI of the cervical and thoracic spine did not show any etiology for right leg weakness.  She did have an ultrasound of the lower extremity which was negative for DVT but did show some subcutaneous edema.  She was empirically started on antibiotics.  She was found to have vitamin B12 deficiency, vitamin D deficiency and folic acid deficiency.  She was supplemented on Mayzent also given high-dose IV thiamine.  At the time of discharge the patient was able to move her right leg is still weaker than normal but better than on admission.

## 2022-07-18 NOTE — Discharge Summary (Signed)
Physician Discharge Summary   Patient: Michele HidesLinda Brewer MRN: [id number] DOB: [birth year]  Admit date:     07/14/2022  Discharge date: 07/18/22  Discharge Physician: Alford Highlandichard Elizah Mierzwa   PCP: Barbette ReichmannHande, Vishwanath, MD   Recommendations at discharge:    Follow up team at rehab one day  Discharge Diagnoses: Principal Problem:   Right leg weakness Active Problems:   Parkinson disease   Depression   Ambulatory dysfunction   Neuropathy   Vitamin B12 deficiency   Vitamin D deficiency   Folate deficiency   Constipation    Hospital Course: 71 year old female with past medical history of Parkinson's disease.  Patient presented to the hospital with right leg weakness and difficulty with ambulating.  She was unable to move her right leg at all initially.  MRI of the brain was negative for stroke.  MRI of the cervical and thoracic spine did not show any etiology for right leg weakness.  She did have an ultrasound of the lower extremity which was negative for DVT but did show some subcutaneous edema.  She was empirically started on antibiotics.  She was found to have vitamin B12 deficiency, vitamin D deficiency and folate deficiency.  She was supplemented.  Patient was also given high-dose IV thiamine.  At the time of discharge the patient was able to move her right leg is still weaker than normal but better than on admission.  Assessment and Plan: * Right leg weakness MRI of the brain negative.  MRI of the cervical thoracic spine does not explain her weakness.  I did place on empiric antibiotics just in case cellulitis with subcutaneous edema seen on both legs on ultrasound.  We will continue 4 more days of Keflex upon discharge.  Right knee x-ray negative.  Continue working with physical therapy.  Patient's strength has improved today but still weak.  Continue B12, vitamin D, folic acid and thiamine replacement.  Parkinson disease We will continue patient on Sinemet IR and Neurontin. Continue working  with physical therapy.   Depression On Cymbalta  Constipation Patient had bowel movement with enema.  As needed MiraLAX prescribed.  Folate deficiency Oral supplementation  Vitamin D deficiency Supplement vitamin D orally  Vitamin B12 deficiency Received intramuscular B12 injections for 3 days then change over to oral  Neuropathy Continue gabapentin  Ambulatory dysfunction Physical therapy recommending rehab         Consultants: Neurology Procedures performed: None Disposition: Rehabilitation facility Diet recommendation:  Regular with thin liquid DISCHARGE MEDICATION: Allergies as of 07/18/2022   No Known Allergies      Medication List     TAKE these medications    acetaminophen 325 MG tablet Commonly known as: TYLENOL Take 2 tablets (650 mg total) by mouth every 6 (six) hours as needed for mild pain, moderate pain, fever or headache (or Fever >/= 101).   carbidopa-levodopa 25-100 MG tablet Commonly known as: SINEMET IR Take 1.5 tablets by mouth 2 (two) times daily. What changed:  how much to take when to take this additional instructions   carbidopa-levodopa 25-100 MG tablet Commonly known as: SINEMET IR Take 1.5 tablets by mouth at bedtime. What changed: You were already taking a medication with the same name, and this prescription was added. Make sure you understand how and when to take each.   cephALEXin 500 MG capsule Commonly known as: KEFLEX Take 1 capsule (500 mg total) by mouth every 8 (eight) hours for 4 days.   cyanocobalamin 1000 MCG tablet Take 1 tablet (1,000 mcg  total) by mouth daily. Start taking on: July 19, 2022   DULoxetine 30 MG capsule Commonly known as: CYMBALTA Take 1 capsule (30 mg total) by mouth 2 (two) times daily. What changed: when to take this   feeding supplement (NEPRO CARB STEADY) Liqd Take 237 mLs by mouth 3 (three) times daily between meals.   folic acid 1 MG tablet Commonly known as: FOLVITE Take  1 tablet (1 mg total) by mouth daily. Start taking on: July 19, 2022   gabapentin 100 MG capsule Commonly known as: NEURONTIN Take 100 mg by mouth 3 (three) times daily.   multivitamin with minerals Tabs tablet Take 1 tablet by mouth daily. Start taking on: July 19, 2022   polyethylene glycol 17 g packet Commonly known as: MIRALAX / GLYCOLAX Take 17 g by mouth daily as needed for moderate constipation.   thiamine 100 MG tablet Commonly known as: Vitamin B-1 Take 1 tablet (100 mg total) by mouth daily. Start taking on: July 19, 2022   Vitamin D (Ergocalciferol) 1.25 MG (50000 UNIT) Caps capsule Commonly known as: DRISDOL Take 1 capsule (50,000 Units total) by mouth every 7 (seven) days. Start taking on: July 23, 2022        Contact information for after-discharge care     Destination     [facility] Preferred SNF .   Service: Skilled Nursing Contact information: [address] [place] [address] [phone]                    Discharge Exam: Filed Weights   07/16/22 0500 07/17/22 0441 07/18/22 0500  Weight: 68.5 kg 65.1 kg 64.8 kg   Physical Exam HENT:     Head: Normocephalic.     Mouth/Throat:     Pharynx: No oropharyngeal exudate.  Eyes:     General: Lids are normal.     Conjunctiva/sclera: Conjunctivae normal.  Cardiovascular:     Rate and Rhythm: Normal rate and regular rhythm.     Heart sounds: Normal heart sounds, S1 normal and S2 normal.  Pulmonary:     Breath sounds: No decreased breath sounds, wheezing, rhonchi or rales.  Abdominal:     Palpations: Abdomen is soft.     Tenderness: There is no abdominal tenderness.  Musculoskeletal:     Right lower leg: Swelling present.     Left lower leg: Swelling present.  Skin:    General: Skin is warm.  Neurological:     Mental Status: She is alert and oriented to person, place, and time.     Comments: Slightly able to straight leg raise with the  right leg today.  Able to flex up and down with her ankle on the right foot.  Able to wiggle toes.      Condition at discharge: stable  The results of significant diagnostics from this hospitalization (including imaging, microbiology, ancillary and laboratory) are listed below for reference.   Imaging Studies: DG Knee 1-2 Views Right  Result Date: 07/16/2022 CLINICAL DATA:  Right knee pain.  No injury. EXAM: RIGHT KNEE - 1-2 VIEW COMPARISON:  None Available. FINDINGS: No fracture.  No bone lesion. Knee joint normally spaced and aligned. Minimal marginal osteophytes from the patella and lateral compartment. No joint effusion. Soft tissues are unremarkable. IMPRESSION: No fracture or acute finding. Electronically Signed   By: Amie Portland M.D.   On: 07/16/2022 14:47   MR CERVICAL SPINE WO CONTRAST  Result Date: 07/16/2022 CLINICAL DATA:  Cervical  and thoracic pain EXAM: MRI CERVICAL AND THORACIC SPINE WITHOUT CONTRAST TECHNIQUE: Multiplanar and multiecho pulse sequences of the cervical spine, to include the craniocervical junction and cervicothoracic junction, and the thoracic spine, were obtained without intravenous contrast. COMPARISON:  No prior MRI of the cervical or thoracic spine, correlation is made with CT cervical spine and CT chest 02/03/2021 FINDINGS: MRI CERVICAL SPINE FINDINGS Alignment: Straightening and mild reversal of the normal cervical lordosis. No significant listhesis. Vertebrae: No fracture, evidence of discitis, or bone lesion. Cord: Normal signal and morphology. Posterior Fossa, vertebral arteries, paraspinal tissues: Negative. Disc levels: C2-C3: No significant disc bulge. No spinal canal stenosis or neuroforaminal narrowing. C3-C4: Mild disc bulge. Facet and uncovertebral hypertrophy. No spinal canal stenosis. Mild bilateral neural foraminal narrowing. C4-C5: Mild disc bulge. Right-greater-than-left uncovertebral and facet arthropathy. No spinal canal stenosis. Severe right  and mild left neural foraminal narrowing. C5-C6: Mild disc bulge. No spinal canal stenosis or neural foraminal narrowing. C6-C7: Mild disc bulge. Facet and uncovertebral hypertrophy. No spinal canal stenosis. Mild-to-moderate left neural foraminal narrowing. C7-T1: No significant disc bulge. No spinal canal stenosis or neuroforaminal narrowing. MRI THORACIC SPINE FINDINGS Alignment: Trace anterolisthesis of T1 on T2 and T2 on T3. Mild exaggeration of the normal thoracic kyphosis. Vertebrae: No fracture, evidence of discitis, or bone lesion. Cord:  Normal signal and morphology. Paraspinal and other soft tissues: Trace pleural effusions. Disc levels: Small disc bulges without significant spinal canal stenosis. No neural foraminal narrowing. IMPRESSION: 1. C4-C5 severe right and mild left neural foraminal narrowing. 2. C6-C7 mild-to-moderate left neural foraminal narrowing. 3. C3-C4 mild bilateral neural foraminal narrowing. 4. No spinal canal stenosis in the cervical or thoracic spine. 5. Trace pleural effusions. Electronically Signed   By: Merilyn Baba M.D.   On: 07/16/2022 02:44   MR THORACIC SPINE WO CONTRAST  Result Date: 07/16/2022 CLINICAL DATA:  Cervical and thoracic pain EXAM: MRI CERVICAL AND THORACIC SPINE WITHOUT CONTRAST TECHNIQUE: Multiplanar and multiecho pulse sequences of the cervical spine, to include the craniocervical junction and cervicothoracic junction, and the thoracic spine, were obtained without intravenous contrast. COMPARISON:  No prior MRI of the cervical or thoracic spine, correlation is made with CT cervical spine and CT chest 02/03/2021 FINDINGS: MRI CERVICAL SPINE FINDINGS Alignment: Straightening and mild reversal of the normal cervical lordosis. No significant listhesis. Vertebrae: No fracture, evidence of discitis, or bone lesion. Cord: Normal signal and morphology. Posterior Fossa, vertebral arteries, paraspinal tissues: Negative. Disc levels: C2-C3: No significant disc bulge.  No spinal canal stenosis or neuroforaminal narrowing. C3-C4: Mild disc bulge. Facet and uncovertebral hypertrophy. No spinal canal stenosis. Mild bilateral neural foraminal narrowing. C4-C5: Mild disc bulge. Right-greater-than-left uncovertebral and facet arthropathy. No spinal canal stenosis. Severe right and mild left neural foraminal narrowing. C5-C6: Mild disc bulge. No spinal canal stenosis or neural foraminal narrowing. C6-C7: Mild disc bulge. Facet and uncovertebral hypertrophy. No spinal canal stenosis. Mild-to-moderate left neural foraminal narrowing. C7-T1: No significant disc bulge. No spinal canal stenosis or neuroforaminal narrowing. MRI THORACIC SPINE FINDINGS Alignment: Trace anterolisthesis of T1 on T2 and T2 on T3. Mild exaggeration of the normal thoracic kyphosis. Vertebrae: No fracture, evidence of discitis, or bone lesion. Cord:  Normal signal and morphology. Paraspinal and other soft tissues: Trace pleural effusions. Disc levels: Small disc bulges without significant spinal canal stenosis. No neural foraminal narrowing. IMPRESSION: 1. C4-C5 severe right and mild left neural foraminal narrowing. 2. C6-C7 mild-to-moderate left neural foraminal narrowing. 3. C3-C4 mild bilateral neural foraminal narrowing. 4. No  spinal canal stenosis in the cervical or thoracic spine. 5. Trace pleural effusions. Electronically Signed   By: Wiliam Ke M.D.   On: 07/16/2022 02:44   MR BRAIN WO CONTRAST  Result Date: 07/15/2022 CLINICAL DATA:  Neuro deficit, acute, stroke suspected. Increased frequency of feeling frozen and unable to move. EXAM: MRI HEAD WITHOUT CONTRAST TECHNIQUE: Multiplanar, multiecho pulse sequences of the brain and surrounding structures were obtained without intravenous contrast. COMPARISON:  None Available. FINDINGS: Brain: No acute infarct, hemorrhage, or mass lesion is present. No acute infarct, hemorrhage, or mass lesion is present. Mild atrophy and white matter changes are within  normal limits for age. The ventricles are of normal size. No significant extraaxial fluid collection is present. The internal auditory canals are within normal limits. The brainstem and cerebellum are within normal limits. Vascular: Flow is present in the major intracranial arteries. Skull and upper cervical spine: The craniocervical junction is normal. Upper cervical spine is within normal limits. Marrow signal is unremarkable. Sinuses/Orbits: The paranasal sinuses and mastoid air cells are clear. The globes and orbits are within normal limits. IMPRESSION: Normal MRI appearance of the brain for age. No acute or focal lesion to explain the patient's symptoms. Electronically Signed   By: Marin Roberts M.D.   On: 07/15/2022 13:26   US Venous Img Lower Bilateral (DVT)  Result Date: 07/14/2022 CLINICAL DATA:  Bilateral leg swelling x3 months EXAM: BILATERAL LOWER EXTREMITY VENOUS DOPPLER ULTRASOUND TECHNIQUE: Gray-scale sonography with compression, as well as color and duplex ultrasound, were performed to evaluate the deep venous system(s) from the level of the common femoral vein through the popliteal and proximal calf veins. COMPARISON:  None Available. FINDINGS: VENOUS Normal compressibility of the common femoral, superficial femoral, and popliteal veins, as well as the visualized calf veins. Visualized portions of profunda femoral vein and great saphenous vein unremarkable. No filling defects to suggest DVT on grayscale or color Doppler imaging. Doppler waveforms show normal direction of venous flow, normal respiratory plasticity and response to augmentation. OTHER Subcutaneous edema in the bilateral calves, right greater than left. Limitations: none IMPRESSION: Negative. Electronically Signed   By: Charline Bills M.D.   On: 07/14/2022 17:18   CT Head Wo Contrast  Result Date: 07/14/2022 CLINICAL DATA:  Dizziness, persistent/recurrent, cardiac or vascular cause suspected EXAM: CT HEAD WITHOUT  CONTRAST TECHNIQUE: Contiguous axial images were obtained from the base of the skull through the vertex without intravenous contrast. RADIATION DOSE REDUCTION: This exam was performed according to the departmental dose-optimization program which includes automated exposure control, adjustment of the mA and/or kV according to patient size and/or use of iterative reconstruction technique. COMPARISON:  None Available. FINDINGS: Brain: No evidence of acute infarction, hemorrhage, hydrocephalus, extra-axial collection or mass lesion/mass effect. Vascular: No hyperdense vessel identified. Skull: No acute fracture. Sinuses/Orbits: Clear sinuses.  No acute orbital findings. Other: No mastoid effusions. IMPRESSION: No evidence of acute intracranial abnormality. Electronically Signed   By: Feliberto Harts M.D.   On: 07/14/2022 16:30   DG Chest 1 View  Result Date: 07/14/2022 CLINICAL DATA:  Muscle tightness, tremors EXAM: CHEST  1 VIEW COMPARISON:  02/03/2021 FINDINGS: The heart size and mediastinal contours are within normal limits. Both lungs are clear. The visualized skeletal structures are unremarkable. IMPRESSION: No active disease. Electronically Signed   By: Sharlet Salina M.D.   On: 07/14/2022 15:21    Microbiology: Results for orders placed or performed during the hospital encounter of 02/03/21  Resp Panel by RT-PCR (Flu A&B,  Covid) Nasopharyngeal Swab     Status: None   Collection Time: 02/03/21 10:31 PM   Specimen: Nasopharyngeal Swab; Nasopharyngeal(NP) swabs in vial transport medium  Result Value Ref Range Status   SARS Coronavirus 2 by RT PCR NEGATIVE NEGATIVE Final    Comment: (NOTE) SARS-CoV-2 target nucleic acids are NOT DETECTED.  The SARS-CoV-2 RNA is generally detectable in upper respiratory specimens during the acute phase of infection. The lowest concentration of SARS-CoV-2 viral copies this assay can detect is 138 copies/mL. A negative result does not preclude SARS-Cov-2 infection  and should not be used as the sole basis for treatment or other patient management decisions. A negative result may occur with  improper specimen collection/handling, submission of specimen other than nasopharyngeal swab, presence of viral mutation(s) within the areas targeted by this assay, and inadequate number of viral copies(<138 copies/mL). A negative result must be combined with clinical observations, patient history, and epidemiological information. The expected result is Negative.  Fact Sheet for Patients:  BloggerCourse.com  Fact Sheet for Healthcare Providers:  SeriousBroker.it  This test is no t yet approved or cleared by the [place] FDA and  has been authorized for detection and/or diagnosis of SARS-CoV-2 by FDA under an Emergency Use Authorization (EUA). This EUA will remain  in effect (meaning this test can be used) for the duration of the COVID-19 declaration under Section 564(b)(1) of the Act, 21 U.S.C.section 360bbb-3(b)(1), unless the authorization is terminated  or revoked sooner.       Influenza A by PCR NEGATIVE NEGATIVE Final   Influenza B by PCR NEGATIVE NEGATIVE Final    Comment: (NOTE) The Xpert Xpress SARS-CoV-2/FLU/RSV plus assay is intended as an aid in the diagnosis of influenza from Nasopharyngeal swab specimens and should not be used as a sole basis for treatment. Nasal washings and aspirates are unacceptable for Xpert Xpress SARS-CoV-2/FLU/RSV testing.  Fact Sheet for Patients: BloggerCourse.com  Fact Sheet for Healthcare Providers: SeriousBroker.it  This test is not yet approved or cleared by the [place] FDA and has been authorized for detection and/or diagnosis of SARS-CoV-2 by FDA under an Emergency Use Authorization (EUA). This EUA will remain in effect (meaning this test can be used) for the duration of the COVID-19 declaration  under Section 564(b)(1) of the Act, 21 U.S.C. section 360bbb-3(b)(1), unless the authorization is terminated or revoked.  Performed at [facility], [address] [place], [place] [address]   SARS CORONAVIRUS 2 (TAT 6-24 HRS) Nasopharyngeal Nasopharyngeal Swab     Status: None   Collection Time: 02/07/21  1:58 PM   Specimen: Nasopharyngeal Swab  Result Value Ref Range Status   SARS Coronavirus 2 NEGATIVE NEGATIVE Final    Comment: (NOTE) SARS-CoV-2 target nucleic acids are NOT DETECTED.  The SARS-CoV-2 RNA is generally detectable in upper and lower respiratory specimens during the acute phase of infection. Negative results do not preclude SARS-CoV-2 infection, do not rule out co-infections with other pathogens, and should not be used as the sole basis for treatment or other patient management decisions. Negative results must be combined with clinical observations, patient history, and epidemiological information. The expected result is Negative.  Fact Sheet for Patients: HairSlick.no  Fact Sheet for Healthcare Providers: quierodirigir.com  This test is not yet approved or cleared by the [place] FDA and  has been authorized for detection and/or diagnosis of SARS-CoV-2 by FDA under an Emergency Use Authorization (EUA). This EUA will remain  in effect (meaning this test can be used)  for the duration of the COVID-19 declaration under Se ction 564(b)(1) of the Act, 21 U.S.C. section 360bbb-3(b)(1), unless the authorization is terminated or revoked sooner.  Performed at [facility], [address], [place], [place] [address]     Labs: CBC: Recent Labs  Lab 07/14/22 1452 07/15/22 0203 07/15/22 0436  WBC 4.5 5.9 5.6  NEUTROABS 3.4  --   --   HGB 12.4 11.4* 11.7*  HCT 37.4 32.2* 34.2*  MCV 94.7 95.0 92.9  PLT 193 170 158   Basic Metabolic Panel: Recent Labs  Lab 07/14/22 1452  07/15/22 0203 07/15/22 0436  NA 140  --  139  K 4.0  --  3.6  CL 104  --  108  CO2 26  --  24  GLUCOSE 95  --  78  BUN 31*  --  24*  CREATININE 0.76 0.66 0.62  CALCIUM 10.0  --  8.9  MG 2.0  --   --    Liver Function Tests: Recent Labs  Lab 07/15/22 0436  AST 41  ALT 17  ALKPHOS 84  BILITOT 1.6*  PROT 6.4*  ALBUMIN 3.5    Discharge time spent: greater than 30 minutes.  Signed: Alford Highland, MD [facility] 07/18/2022

## 2022-07-19 DIAGNOSIS — D519 Vitamin B12 deficiency anemia, unspecified: Secondary | ICD-10-CM | POA: Diagnosis not present

## 2022-07-19 DIAGNOSIS — M6281 Muscle weakness (generalized): Secondary | ICD-10-CM | POA: Diagnosis not present

## 2022-07-19 DIAGNOSIS — E559 Vitamin D deficiency, unspecified: Secondary | ICD-10-CM | POA: Diagnosis not present

## 2022-07-19 DIAGNOSIS — M79604 Pain in right leg: Secondary | ICD-10-CM | POA: Diagnosis not present

## 2022-07-19 LAB — VITAMIN B6: Vitamin B6: 5.7 ug/L (ref 3.4–65.2)

## 2022-07-20 DIAGNOSIS — E559 Vitamin D deficiency, unspecified: Secondary | ICD-10-CM | POA: Diagnosis not present

## 2022-07-20 DIAGNOSIS — K219 Gastro-esophageal reflux disease without esophagitis: Secondary | ICD-10-CM | POA: Diagnosis not present

## 2022-07-20 DIAGNOSIS — G20C Parkinsonism, unspecified: Secondary | ICD-10-CM | POA: Diagnosis not present

## 2022-07-20 DIAGNOSIS — W19XXXD Unspecified fall, subsequent encounter: Secondary | ICD-10-CM | POA: Diagnosis not present

## 2022-07-20 DIAGNOSIS — F32A Depression, unspecified: Secondary | ICD-10-CM | POA: Diagnosis not present

## 2022-07-20 DIAGNOSIS — T796XXD Traumatic ischemia of muscle, subsequent encounter: Secondary | ICD-10-CM | POA: Diagnosis not present

## 2022-07-20 LAB — VITAMIN B1: Vitamin B1 (Thiamine): 67.4 nmol/L (ref 66.5–200.0)

## 2022-07-21 LAB — VITAMIN E
Vitamin E (Alpha Tocopherol): 6.6 mg/L — ABNORMAL LOW (ref 9.0–29.0)
Vitamin E(Gamma Tocopherol): 1.3 mg/L (ref 0.5–4.9)

## 2022-07-21 LAB — VITAMIN A: Vitamin A (Retinoic Acid): 17.7 ug/dL — ABNORMAL LOW (ref 22.0–69.5)

## 2022-07-24 DIAGNOSIS — D519 Vitamin B12 deficiency anemia, unspecified: Secondary | ICD-10-CM | POA: Diagnosis not present

## 2022-07-24 DIAGNOSIS — J309 Allergic rhinitis, unspecified: Secondary | ICD-10-CM | POA: Diagnosis not present

## 2022-07-24 DIAGNOSIS — E559 Vitamin D deficiency, unspecified: Secondary | ICD-10-CM | POA: Diagnosis not present

## 2022-07-25 DIAGNOSIS — M6281 Muscle weakness (generalized): Secondary | ICD-10-CM | POA: Diagnosis not present

## 2022-07-25 DIAGNOSIS — G20C Parkinsonism, unspecified: Secondary | ICD-10-CM | POA: Diagnosis not present

## 2022-07-25 DIAGNOSIS — M792 Neuralgia and neuritis, unspecified: Secondary | ICD-10-CM | POA: Diagnosis not present

## 2022-07-25 DIAGNOSIS — F32A Depression, unspecified: Secondary | ICD-10-CM | POA: Diagnosis not present

## 2022-07-26 LAB — ZINC: Zinc: 56 ug/dL (ref 44–115)

## 2022-07-26 LAB — COPPER, SERUM: Copper: 96 ug/dL (ref 80–158)

## 2022-07-28 DIAGNOSIS — E559 Vitamin D deficiency, unspecified: Secondary | ICD-10-CM | POA: Diagnosis not present

## 2022-07-28 DIAGNOSIS — M79604 Pain in right leg: Secondary | ICD-10-CM | POA: Diagnosis not present

## 2022-07-28 DIAGNOSIS — D519 Vitamin B12 deficiency anemia, unspecified: Secondary | ICD-10-CM | POA: Diagnosis not present

## 2022-07-28 DIAGNOSIS — M6281 Muscle weakness (generalized): Secondary | ICD-10-CM | POA: Diagnosis not present

## 2022-08-01 ENCOUNTER — Encounter: Payer: Self-pay | Admitting: Dietician

## 2022-08-01 DIAGNOSIS — F32A Depression, unspecified: Secondary | ICD-10-CM | POA: Diagnosis not present

## 2022-08-01 DIAGNOSIS — K219 Gastro-esophageal reflux disease without esophagitis: Secondary | ICD-10-CM | POA: Diagnosis not present

## 2022-08-01 DIAGNOSIS — W19XXXD Unspecified fall, subsequent encounter: Secondary | ICD-10-CM | POA: Diagnosis not present

## 2022-08-01 DIAGNOSIS — E559 Vitamin D deficiency, unspecified: Secondary | ICD-10-CM | POA: Diagnosis not present

## 2022-08-01 DIAGNOSIS — T796XXD Traumatic ischemia of muscle, subsequent encounter: Secondary | ICD-10-CM | POA: Diagnosis not present

## 2022-08-01 DIAGNOSIS — G20C Parkinsonism, unspecified: Secondary | ICD-10-CM | POA: Diagnosis not present

## 2022-08-01 NOTE — Progress Notes (Signed)
Nutrition Brief Note   Vitamin labs sent out while patient was hospitalized as pt with neuropathy, weakness and on chronic Sinemet. Labs returned as follows:    Spoke with pt via phone. Recommended:  Vitamin D2- 50,000 units po weekly B12- 2000 units po daily Folic acid- 1mg  po daily Vitamin A- 10,000 units po daily  Vitamin E- 400 units po daily   Recommended follow-up with PCP in 30-60 days to recheck vitamins labs. Pt is at high risk for deficiency r/t chronic Sinemet use. Recommend checking vitamin labs yearly.   [birth year]-21-1969 MS, RD, LDN Please refer to [facility] for RD and/or RD on-call/weekend/after hours pager

## 2022-08-18 DIAGNOSIS — G609 Hereditary and idiopathic neuropathy, unspecified: Secondary | ICD-10-CM | POA: Diagnosis not present

## 2022-08-18 DIAGNOSIS — J309 Allergic rhinitis, unspecified: Secondary | ICD-10-CM | POA: Diagnosis not present

## 2022-08-18 DIAGNOSIS — F32A Depression, unspecified: Secondary | ICD-10-CM | POA: Diagnosis not present

## 2022-08-18 DIAGNOSIS — R296 Repeated falls: Secondary | ICD-10-CM | POA: Diagnosis not present

## 2022-08-18 DIAGNOSIS — R2689 Other abnormalities of gait and mobility: Secondary | ICD-10-CM | POA: Diagnosis not present

## 2022-08-18 DIAGNOSIS — K5900 Constipation, unspecified: Secondary | ICD-10-CM | POA: Diagnosis not present

## 2022-08-18 DIAGNOSIS — G20C Parkinsonism, unspecified: Secondary | ICD-10-CM | POA: Diagnosis not present

## 2022-08-18 DIAGNOSIS — E559 Vitamin D deficiency, unspecified: Secondary | ICD-10-CM | POA: Diagnosis not present

## 2022-08-18 DIAGNOSIS — K219 Gastro-esophageal reflux disease without esophagitis: Secondary | ICD-10-CM | POA: Diagnosis not present

## 2022-08-18 DIAGNOSIS — R531 Weakness: Secondary | ICD-10-CM | POA: Diagnosis not present

## 2022-08-18 DIAGNOSIS — D510 Vitamin B12 deficiency anemia due to intrinsic factor deficiency: Secondary | ICD-10-CM | POA: Diagnosis not present

## 2022-08-25 DIAGNOSIS — J309 Allergic rhinitis, unspecified: Secondary | ICD-10-CM | POA: Diagnosis not present

## 2022-09-08 DIAGNOSIS — R2689 Other abnormalities of gait and mobility: Secondary | ICD-10-CM | POA: Diagnosis not present

## 2022-09-08 DIAGNOSIS — G20A1 Parkinson's disease without dyskinesia, without mention of fluctuations: Secondary | ICD-10-CM | POA: Diagnosis not present

## 2022-09-08 DIAGNOSIS — K219 Gastro-esophageal reflux disease without esophagitis: Secondary | ICD-10-CM | POA: Diagnosis not present

## 2022-09-08 DIAGNOSIS — F32A Depression, unspecified: Secondary | ICD-10-CM | POA: Diagnosis not present

## 2022-09-08 DIAGNOSIS — R531 Weakness: Secondary | ICD-10-CM | POA: Diagnosis not present

## 2022-09-08 DIAGNOSIS — R296 Repeated falls: Secondary | ICD-10-CM | POA: Diagnosis not present

## 2022-09-08 DIAGNOSIS — E559 Vitamin D deficiency, unspecified: Secondary | ICD-10-CM | POA: Diagnosis not present

## 2022-09-08 DIAGNOSIS — G609 Hereditary and idiopathic neuropathy, unspecified: Secondary | ICD-10-CM | POA: Diagnosis not present

## 2022-09-22 DIAGNOSIS — R531 Weakness: Secondary | ICD-10-CM | POA: Diagnosis not present

## 2022-09-22 DIAGNOSIS — K219 Gastro-esophageal reflux disease without esophagitis: Secondary | ICD-10-CM | POA: Diagnosis not present

## 2022-09-22 DIAGNOSIS — G609 Hereditary and idiopathic neuropathy, unspecified: Secondary | ICD-10-CM | POA: Diagnosis not present

## 2022-09-22 DIAGNOSIS — D510 Vitamin B12 deficiency anemia due to intrinsic factor deficiency: Secondary | ICD-10-CM | POA: Diagnosis not present

## 2022-09-22 DIAGNOSIS — R296 Repeated falls: Secondary | ICD-10-CM | POA: Diagnosis not present

## 2022-09-22 DIAGNOSIS — G20C Parkinsonism, unspecified: Secondary | ICD-10-CM | POA: Diagnosis not present

## 2022-09-22 DIAGNOSIS — R2689 Other abnormalities of gait and mobility: Secondary | ICD-10-CM | POA: Diagnosis not present

## 2022-09-22 DIAGNOSIS — F32A Depression, unspecified: Secondary | ICD-10-CM | POA: Diagnosis not present

## 2022-09-22 DIAGNOSIS — K5900 Constipation, unspecified: Secondary | ICD-10-CM | POA: Diagnosis not present

## 2022-09-22 DIAGNOSIS — E559 Vitamin D deficiency, unspecified: Secondary | ICD-10-CM | POA: Diagnosis not present

## 2022-10-11 IMAGING — CT CT CERVICAL SPINE W/O CM
3 series · 9 of 35 positions shown, 10 images · non-contrast
Comparison: None.

CLINICAL DATA: Rolled from bed on [REDACTED], unable to get up

EXAM:
CT HEAD WITHOUT CONTRAST
CT CERVICAL SPINE WITHOUT CONTRAST
TECHNIQUE: Multidetector CT imaging of the head and cervical spine was
performed following the standard protocol without intravenous
contrast. Multiplanar CT image reconstructions of the cervical spine
were also generated.

[Series 3: c spine soft · axial · 0.40mm/px · z∈[+319,+319]mm · 1 of 61 slices shown, 2 images]
[im 33/61  soft-tissue]
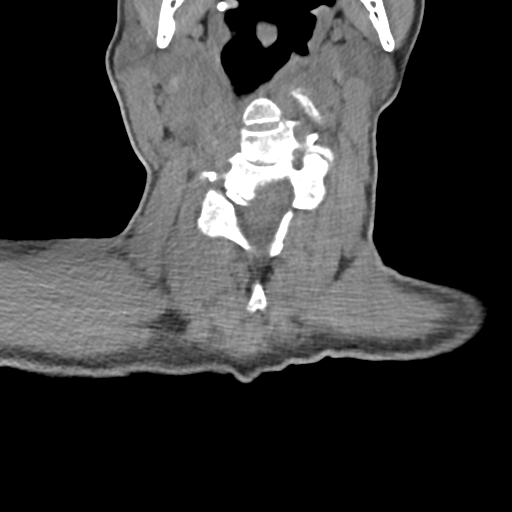
[im 33/61  bone]
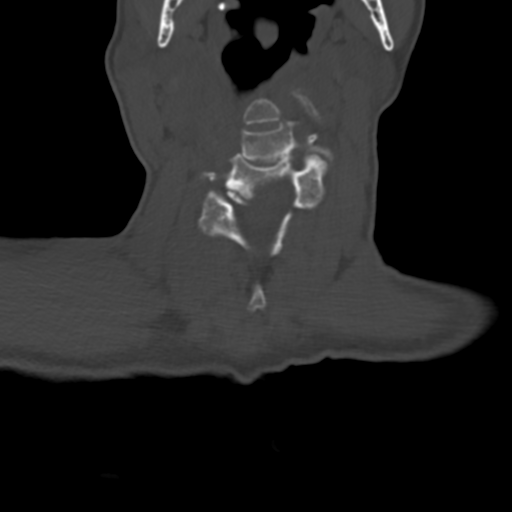

[Series 6: sagittal bone · sagittal · 0.21mm/px · 5 of 37 slices shown]
[im 13/37  bone]
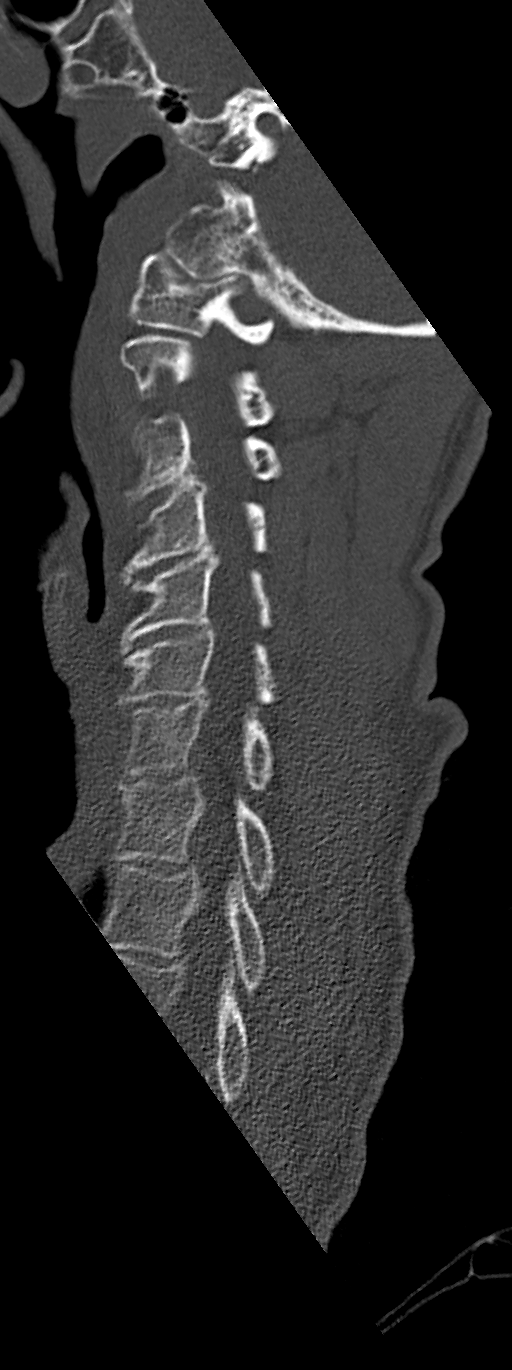
[im 16/37  bone]
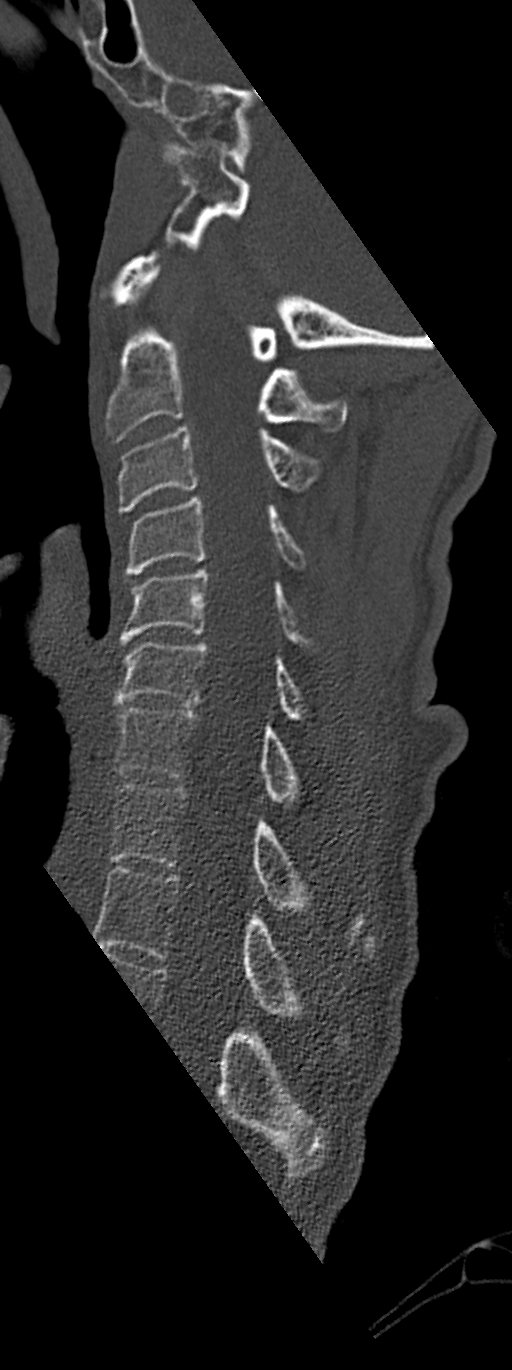
[im 19/37  bone]
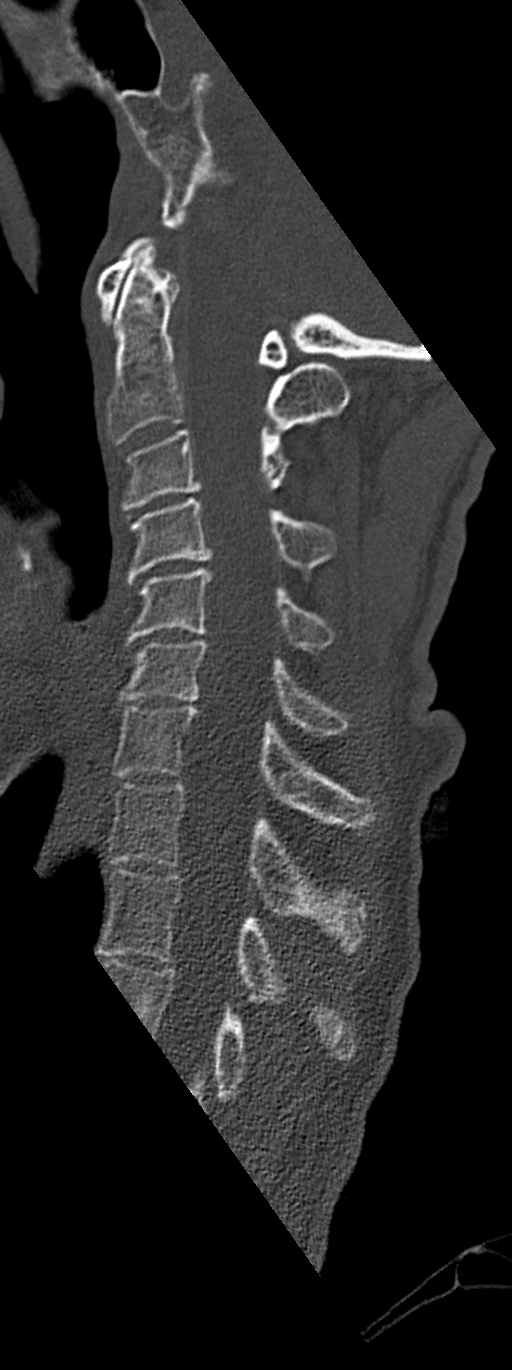
[im 22/37  bone]
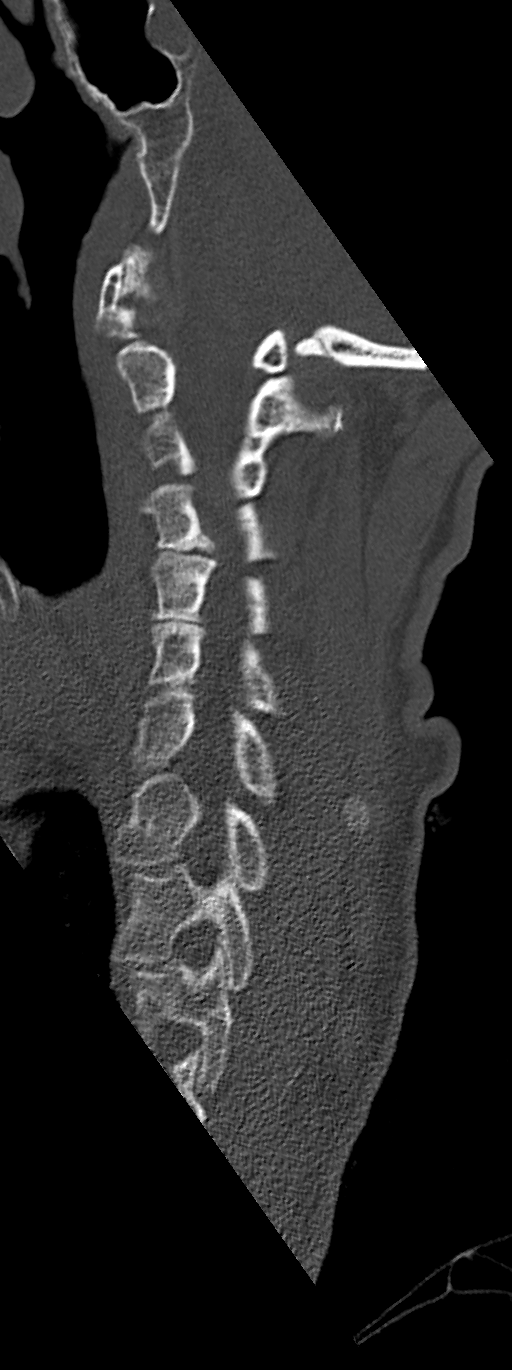
[im 25/37  bone]
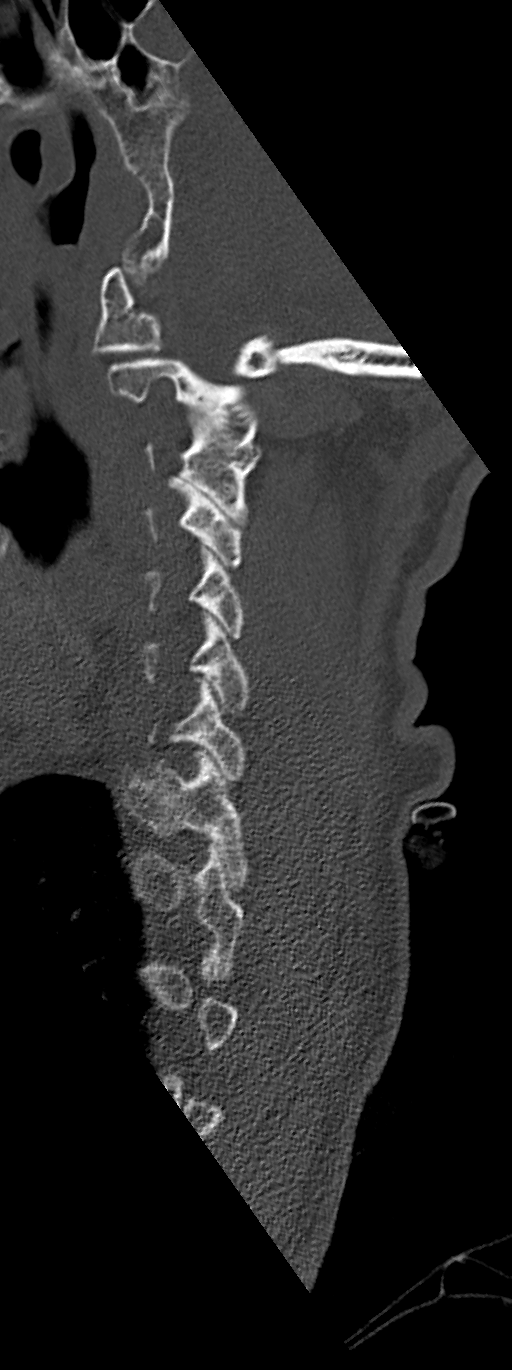

[Series 8: orthogonal bone · coronal · 0.21mm/px · 3 of 88 slices shown]
[im 31/88  bone]
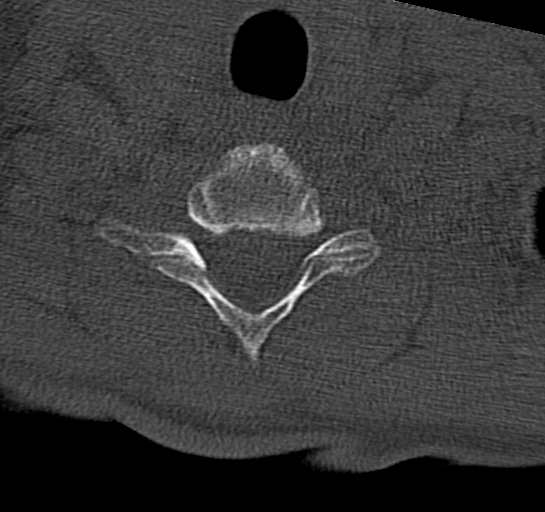
[im 40/88  bone]
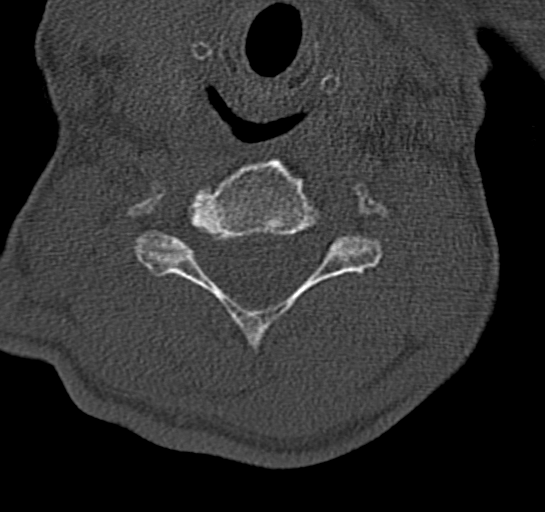
[im 48/88  bone]
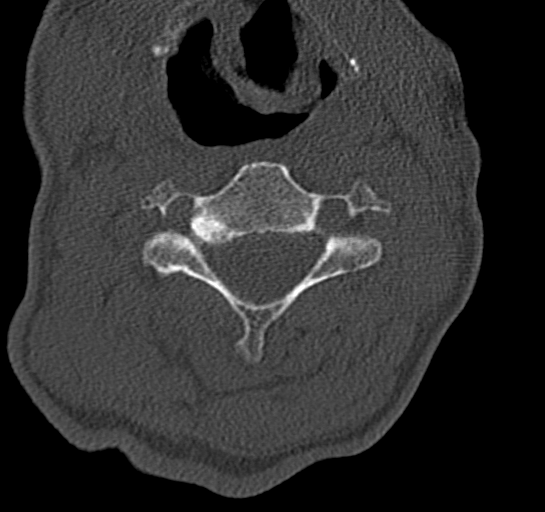

[9 of 35 positions shown; findings below may reference images not displayed]

FINDINGS: CT HEAD FINDINGS

Brain: No evidence of acute infarction, hemorrhage, hydrocephalus,
extra-axial collection, visible mass lesion or mass effect.
Symmetric prominence of the ventricles, cisterns and sulci
compatible with parenchymal volume loss with a slight frontal lobe
predominance. Patchy areas of white matter hypoattenuation are most
compatible with chronic microvascular angiopathy. Scattered dural
calcifications. Insert tonsils normally position. Benign
intracranial structures are unremarkable.

Vascular: Atherosclerotic calcification of the carotid siphons. No
hyperdense vessel.

Skull: No significant scalp swelling, hematoma or calvarial
fracture.

Sinuses/Orbits: Paranasal sinuses and mastoid air cells are
predominantly clear. Included orbital structures are unremarkable.

Other: None

CT CERVICAL SPINE FINDINGS

Alignment: Stabilization collar absent at the time of exam. Mild
cervical flexion noted scout view. Right lateral bending noted on
coronal view. There is slight leftward cranial rotation as well.
Reversal the normal cervical lordosis likely related to cervical
flexion and positioning. No evidence of traumatic listhesis. No
abnormally widened, perched or jumped facets. Normal alignment of
the craniocervical and atlantoaxial articulations.

Skull base and vertebrae: No acute skull base fracture. No vertebral
body fracture or height loss. Normal bone mineralization. Benign
bone island C5. No worrisome osseous lesions.

Soft tissues and spinal canal: No pre or paravertebral fluid or
swelling. No visible canal hematoma.

Disc levels: Multilevel intervertebral disc height loss with
spondylitic endplate changes. Slightly larger disc osteophyte
complex noted C4-5 with partial effacement of the ventral thecal sac
but no significant spinal canal. Uncinate spurring and facet
hypertrophic changes are noted throughout the cervical levels,
resulting in some moderate foraminal narrowing on right C4-5 and
bilaterally C6-7.

Upper chest: No acute abnormality in the upper chest or imaged lung
apices.

Other: No concerning thyroid nodules or masses. Punctate focus of
intravenous gas in the left brachiocephalic vein likely related to
intravenous access.
IMPRESSION: 1. No acute intracranial abnormality. Calvarial fracture or
significant swelling.
2. Background of parenchymal volume loss and microvascular
angiopathy
3. No acute fracture or traumatic listhesis of the cervical spine.
4. Cervical spondylitic changes as described above.

## 2022-11-22 ENCOUNTER — Other Ambulatory Visit: Payer: Self-pay

## 2022-11-22 ENCOUNTER — Other Ambulatory Visit (HOSPITAL_COMMUNITY): Payer: Self-pay

## 2022-11-22 DIAGNOSIS — R296 Repeated falls: Secondary | ICD-10-CM | POA: Diagnosis not present

## 2022-11-22 DIAGNOSIS — M8589 Other specified disorders of bone density and structure, multiple sites: Secondary | ICD-10-CM | POA: Diagnosis not present

## 2022-11-22 DIAGNOSIS — F3340 Major depressive disorder, recurrent, in remission, unspecified: Secondary | ICD-10-CM | POA: Diagnosis not present

## 2022-11-22 DIAGNOSIS — H6123 Impacted cerumen, bilateral: Secondary | ICD-10-CM | POA: Diagnosis not present

## 2022-11-22 DIAGNOSIS — G20A1 Parkinson's disease without dyskinesia, without mention of fluctuations: Secondary | ICD-10-CM | POA: Diagnosis not present

## 2022-11-22 MED ORDER — DULOXETINE HCL 30 MG PO CPEP
30.0000 mg | ORAL_CAPSULE | Freq: Two times a day (BID) | ORAL | 3 refills | Status: DC
  Filled 2022-11-22: qty 180, 90d supply, fill #0

## 2022-11-22 MED ORDER — GABAPENTIN 100 MG PO CAPS
100.0000 mg | ORAL_CAPSULE | Freq: Three times a day (TID) | ORAL | 1 refills | Status: DC
  Filled 2022-11-22: qty 270, 90d supply, fill #0

## 2022-11-24 ENCOUNTER — Other Ambulatory Visit: Payer: Self-pay

## 2022-11-28 ENCOUNTER — Other Ambulatory Visit: Payer: Self-pay

## 2023-01-05 DIAGNOSIS — H6123 Impacted cerumen, bilateral: Secondary | ICD-10-CM | POA: Diagnosis not present

## 2023-01-05 DIAGNOSIS — H900 Conductive hearing loss, bilateral: Secondary | ICD-10-CM | POA: Diagnosis not present

## 2023-02-05 DIAGNOSIS — H8112 Benign paroxysmal vertigo, left ear: Secondary | ICD-10-CM | POA: Diagnosis not present

## 2023-02-28 DIAGNOSIS — H8112 Benign paroxysmal vertigo, left ear: Secondary | ICD-10-CM | POA: Diagnosis not present

## 2023-06-02 ENCOUNTER — Inpatient Hospital Stay
Admission: EM | Admit: 2023-06-02 | Discharge: 2023-06-09 | DRG: 178 | Disposition: A | Discharge status: Home Health | Payer: PPO | Attending: Internal Medicine | Admitting: Internal Medicine

## 2023-06-02 ENCOUNTER — Other Ambulatory Visit: Payer: Self-pay

## 2023-06-02 ENCOUNTER — Emergency Department: Payer: PPO

## 2023-06-02 DIAGNOSIS — W19XXXA Unspecified fall, initial encounter: Secondary | ICD-10-CM | POA: Diagnosis present

## 2023-06-02 DIAGNOSIS — R262 Difficulty in walking, not elsewhere classified: Secondary | ICD-10-CM | POA: Diagnosis present

## 2023-06-02 DIAGNOSIS — Z79899 Other long term (current) drug therapy: Secondary | ICD-10-CM

## 2023-06-02 DIAGNOSIS — R42 Dizziness and giddiness: Secondary | ICD-10-CM | POA: Diagnosis present

## 2023-06-02 DIAGNOSIS — R69 Illness, unspecified: Secondary | ICD-10-CM | POA: Diagnosis not present

## 2023-06-02 DIAGNOSIS — E876 Hypokalemia: Secondary | ICD-10-CM | POA: Diagnosis present

## 2023-06-02 DIAGNOSIS — G20B2 Parkinson's disease with dyskinesia, with fluctuations: Secondary | ICD-10-CM | POA: Diagnosis not present

## 2023-06-02 DIAGNOSIS — Z91010 Allergy to peanuts: Secondary | ICD-10-CM

## 2023-06-02 DIAGNOSIS — Y92009 Unspecified place in unspecified non-institutional (private) residence as the place of occurrence of the external cause: Secondary | ICD-10-CM

## 2023-06-02 DIAGNOSIS — R5383 Other fatigue: Secondary | ICD-10-CM | POA: Diagnosis not present

## 2023-06-02 DIAGNOSIS — R059 Cough, unspecified: Secondary | ICD-10-CM | POA: Diagnosis not present

## 2023-06-02 DIAGNOSIS — F32A Depression, unspecified: Secondary | ICD-10-CM | POA: Diagnosis present

## 2023-06-02 DIAGNOSIS — R519 Headache, unspecified: Secondary | ICD-10-CM | POA: Diagnosis not present

## 2023-06-02 DIAGNOSIS — Z91018 Allergy to other foods: Secondary | ICD-10-CM

## 2023-06-02 DIAGNOSIS — R531 Weakness: Secondary | ICD-10-CM | POA: Diagnosis not present

## 2023-06-02 DIAGNOSIS — E538 Deficiency of other specified B group vitamins: Secondary | ICD-10-CM | POA: Diagnosis not present

## 2023-06-02 DIAGNOSIS — A0839 Other viral enteritis: Secondary | ICD-10-CM | POA: Diagnosis present

## 2023-06-02 DIAGNOSIS — R748 Abnormal levels of other serum enzymes: Secondary | ICD-10-CM

## 2023-06-02 DIAGNOSIS — G20A1 Parkinson's disease without dyskinesia, without mention of fluctuations: Secondary | ICD-10-CM | POA: Diagnosis present

## 2023-06-02 DIAGNOSIS — Z8249 Family history of ischemic heart disease and other diseases of the circulatory system: Secondary | ICD-10-CM

## 2023-06-02 DIAGNOSIS — U071 COVID-19: Secondary | ICD-10-CM | POA: Diagnosis not present

## 2023-06-02 DIAGNOSIS — M6282 Rhabdomyolysis: Secondary | ICD-10-CM | POA: Diagnosis present

## 2023-06-02 LAB — CK: Total CK: 2060 U/L — ABNORMAL HIGH (ref 38–234)

## 2023-06-02 LAB — CBC WITH DIFFERENTIAL/PLATELET
Abs Immature Granulocytes: 0.01 10*3/uL (ref 0.00–0.07)
Basophils Absolute: 0 10*3/uL (ref 0.0–0.1)
Basophils Relative: 0 %
Eosinophils Absolute: 0 10*3/uL (ref 0.0–0.5)
Eosinophils Relative: 0 %
HCT: 33.3 % — ABNORMAL LOW (ref 36.0–46.0)
Hemoglobin: 11.3 g/dL — ABNORMAL LOW (ref 12.0–15.0)
Immature Granulocytes: 0 %
Lymphocytes Relative: 20 %
Lymphs Abs: 0.7 10*3/uL (ref 0.7–4.0)
MCH: 31.2 pg (ref 26.0–34.0)
MCHC: 33.9 g/dL (ref 30.0–36.0)
MCV: 92 fL (ref 80.0–100.0)
Monocytes Absolute: 0.6 10*3/uL (ref 0.1–1.0)
Monocytes Relative: 18 %
Neutro Abs: 2 10*3/uL (ref 1.7–7.7)
Neutrophils Relative %: 62 %
Platelets: 162 10*3/uL (ref 150–400)
RBC: 3.62 MIL/uL — ABNORMAL LOW (ref 3.87–5.11)
RDW: 13.2 % (ref 11.5–15.5)
WBC: 3.2 10*3/uL — ABNORMAL LOW (ref 4.0–10.5)
nRBC: 0 % (ref 0.0–0.2)

## 2023-06-02 LAB — COMPREHENSIVE METABOLIC PANEL
ALT: 24 U/L (ref 0–44)
AST: 62 U/L — ABNORMAL HIGH (ref 15–41)
Albumin: 3.6 g/dL (ref 3.5–5.0)
Alkaline Phosphatase: 87 U/L (ref 38–126)
Anion gap: 11 (ref 5–15)
BUN: 21 mg/dL (ref 8–23)
CO2: 26 mmol/L (ref 22–32)
Calcium: 9.1 mg/dL (ref 8.9–10.3)
Chloride: 100 mmol/L (ref 98–111)
Creatinine, Ser: 0.72 mg/dL (ref 0.44–1.00)
GFR, Estimated: >60 mL/min (ref >60)
Glucose, Bld: 84 mg/dL (ref 70–99)
Potassium: 3.3 mmol/L — ABNORMAL LOW (ref 3.5–5.1)
Sodium: 137 mmol/L (ref 135–145)
Total Bilirubin: 1.2 mg/dL (ref 0.3–1.2)
Total Protein: 7.1 g/dL (ref 6.5–8.1)

## 2023-06-02 LAB — VITAMIN B12: Vitamin B-12: 168 pg/mL — ABNORMAL LOW (ref 180–914)

## 2023-06-02 LAB — RESP PANEL BY RT-PCR (FLU A&B, COVID) ARPGX2
Influenza A by PCR: NEGATIVE
Influenza B by PCR: NEGATIVE
SARS Coronavirus 2 by RT PCR: POSITIVE — AB

## 2023-06-02 LAB — TSH: TSH: 0.779 u[IU]/mL (ref 0.350–4.500)

## 2023-06-02 LAB — FOLATE: Folate: 30 ng/mL (ref >5.9)

## 2023-06-02 MED ORDER — GABAPENTIN 100 MG PO CAPS
100.0000 mg | ORAL_CAPSULE | Freq: Three times a day (TID) | ORAL | Status: DC
  Administered 2023-06-02 – 2023-06-09 (×21): 100 mg via ORAL
  Filled 2023-06-02 (×21): qty 1

## 2023-06-02 MED ORDER — CARBIDOPA-LEVODOPA 25-100 MG PO TABS
1.5000 | ORAL_TABLET | Freq: Three times a day (TID) | ORAL | Status: DC
  Administered 2023-06-02 – 2023-06-09 (×21): 1.5 via ORAL
  Filled 2023-06-02 (×21): qty 2

## 2023-06-02 MED ORDER — ACETAMINOPHEN 650 MG RE SUPP: 650.0000 mg | Freq: Four times a day (QID) | RECTAL | Status: DC | PRN

## 2023-06-02 MED ORDER — ENOXAPARIN SODIUM 40 MG/0.4ML IJ SOSY
40.0000 mg | PREFILLED_SYRINGE | INTRAMUSCULAR | Status: DC
  Administered 2023-06-02 – 2023-06-08 (×7): 40 mg via SUBCUTANEOUS
  Filled 2023-06-02 (×7): qty 0.4

## 2023-06-02 MED ORDER — LOPERAMIDE HCL 2 MG PO CAPS: 2.0000 mg | ORAL_CAPSULE | ORAL | Status: DC | PRN

## 2023-06-02 MED ORDER — POLYETHYLENE GLYCOL 3350 17 G PO PACK: 17.0000 g | PACK | Freq: Every day | ORAL | Status: DC | PRN

## 2023-06-02 MED ORDER — CARBIDOPA-LEVODOPA 25-100 MG PO TABS: 1.5000 | ORAL_TABLET | Freq: Two times a day (BID) | ORAL | Status: DC

## 2023-06-02 MED ORDER — ONDANSETRON HCL 4 MG/2ML IJ SOLN
4.0000 mg | Freq: Four times a day (QID) | INTRAMUSCULAR | Status: DC | PRN
  Filled 2023-06-02: qty 2

## 2023-06-02 MED ORDER — CYANOCOBALAMIN 500 MCG PO TABS: 1000.0000 ug | ORAL_TABLET | Freq: Every day | ORAL | Status: DC

## 2023-06-02 MED ORDER — CARBIDOPA-LEVODOPA 25-100 MG PO TABS: 1.5000 | ORAL_TABLET | Freq: Every day | ORAL | Status: DC

## 2023-06-02 MED ORDER — BENZONATATE 100 MG PO CAPS
200.0000 mg | ORAL_CAPSULE | Freq: Three times a day (TID) | ORAL | Status: DC | PRN
  Administered 2023-06-02 – 2023-06-05 (×2): 200 mg via ORAL
  Filled 2023-06-02 (×2): qty 2

## 2023-06-02 MED ORDER — ONDANSETRON HCL 4 MG PO TABS: 4.0000 mg | ORAL_TABLET | Freq: Four times a day (QID) | ORAL | Status: DC | PRN

## 2023-06-02 MED ORDER — POTASSIUM CHLORIDE CRYS ER 20 MEQ PO TBCR
40.0000 meq | EXTENDED_RELEASE_TABLET | Freq: Once | ORAL | Status: AC
  Administered 2023-06-02: 40 meq via ORAL
  Filled 2023-06-02: qty 2

## 2023-06-02 MED ORDER — FOLIC ACID 1 MG PO TABS
1.0000 mg | ORAL_TABLET | ORAL | Status: AC
  Administered 2023-06-02: 1 mg via ORAL
  Filled 2023-06-02: qty 1

## 2023-06-02 MED ORDER — SODIUM CHLORIDE 0.9 % IV BOLUS
1000.0000 mL | Freq: Once | INTRAVENOUS | Status: AC
  Administered 2023-06-02: 1000 mL via INTRAVENOUS

## 2023-06-02 MED ORDER — ADULT MULTIVITAMIN W/MINERALS CH
1.0000 | ORAL_TABLET | Freq: Every day | ORAL | Status: DC
  Administered 2023-06-02 – 2023-06-09 (×8): 1 via ORAL
  Filled 2023-06-02 (×8): qty 1

## 2023-06-02 MED ORDER — THIAMINE MONONITRATE 100 MG PO TABS
100.0000 mg | ORAL_TABLET | Freq: Every day | ORAL | Status: DC
  Administered 2023-06-02 – 2023-06-09 (×8): 100 mg via ORAL
  Filled 2023-06-02 (×8): qty 1

## 2023-06-02 MED ORDER — FOLIC ACID 1 MG PO TABS
1.0000 mg | ORAL_TABLET | Freq: Every day | ORAL | Status: DC
  Administered 2023-06-03 – 2023-06-09 (×7): 1 mg via ORAL
  Filled 2023-06-02 (×7): qty 1

## 2023-06-02 MED ORDER — CYANOCOBALAMIN 1000 MCG/ML IJ SOLN
1000.0000 ug | INTRAMUSCULAR | Status: DC
  Administered 2023-06-02: 1000 ug via INTRAMUSCULAR
  Filled 2023-06-02: qty 1

## 2023-06-02 MED ORDER — INFLUENZA VAC A&B SURF ANT ADJ 0.5 ML IM SUSY
0.5000 mL | PREFILLED_SYRINGE | INTRAMUSCULAR | Status: DC
  Filled 2023-06-02: qty 0.5

## 2023-06-02 MED ORDER — SODIUM CHLORIDE 0.9% FLUSH
3.0000 mL | Freq: Two times a day (BID) | INTRAVENOUS | Status: DC
  Administered 2023-06-02 – 2023-06-09 (×13): 3 mL via INTRAVENOUS

## 2023-06-02 MED ORDER — ACETAMINOPHEN 325 MG PO TABS
650.0000 mg | ORAL_TABLET | Freq: Four times a day (QID) | ORAL | Status: DC | PRN
  Administered 2023-06-08: 650 mg via ORAL
  Filled 2023-06-02 (×2): qty 2

## 2023-06-02 NOTE — Assessment & Plan Note (Addendum)
Patient presenting with 2-day history of worsening fatigue, cough and congestion with COVID-19 PCR positive. Labs notable for mild leukopenia and elevated AST consistent with COVID. No evidence of SOB, hypoxia or lower respiratory involvement. Given diarrhea with COVID, will hold off on Paxlovid.   - Imodium as needed for diarrhea - Tessalon Perles as needed for cough - S/p 1 L fluids

## 2023-06-02 NOTE — ED Notes (Signed)
Checked if pt voided via purewick. No urine assessed.

## 2023-06-02 NOTE — ED Notes (Signed)
Attempted to ambulate pt. She can lift her legs with ample effort, however she can not stand on her own.

## 2023-06-02 NOTE — ED Provider Notes (Signed)
[facility] Provider Note    Event Date/Time   First MD Initiated Contact with Patient 06/02/23 [phone]     (approximate)   History   Fall   HPI  Michele Brewer is a 72 y.o. female with a history of Parkinson's disease, baseline difficulty with ambulation.  History of B12 deficiency vitamin D deficiency and folate deficiency.  Patient reports that for about the last 2 days she has had slight increase in her fatigue.  Here for evaluation for fatigue.  She has been experiencing a couple days of increasing weakness in the leg that she is also having for "falls".  She advises she feels very weak she suffered no injury during any period the last when she was walking and she felt lightheaded, fell to the floor and thankfully fell onto a pile of her laundry.  She did not suffer any injury.  She relates that she did spend a few hours on the floor before her family found her.  Another family member tested positive for COVID about 2 weeks ago.  She herself does not realize any symptoms except maybe slight increase in fatigue     Physical Exam   Triage Vital Signs: ED Triage Vitals  Encounter Vitals Group     BP 06/02/23 0654 107/61     Systolic BP Percentile --      Diastolic BP Percentile --      Pulse Rate 06/02/23 0654 79     Resp 06/02/23 0654 17     Temp 06/02/23 0654 99.1 F (37.3 C)     Temp src --      SpO2 06/02/23 0654 90 %     Weight 06/02/23 0651 141 lb 1.5 oz (64 kg)     Height 06/02/23 0651 5\' 4"  (1.626 m)     Head Circumference --      Peak Flow --      Pain Score 06/02/23 0651 0     Pain Loc --      Pain Education --      Exclude from Growth Chart --     Most recent vital signs: Vitals:   06/02/23 1753 06/02/23 2001  BP: 119/67 103/64  Pulse: 74 86  Resp: 18 16  Temp: 98.6 F (37 C) 99 F (37.2 C)  SpO2: 100% 100%     General: Awake, no distress.  Normocephalic atraumatic no cervical tenderness CV:  Good peripheral perfusion.   Normal rate and tone Resp:  Normal effort.  Clear bilateral Abd:  No distention.  Soft nontender nondistended Other:  Moist mucous membranes.  She demonstrates no focal motor deficits but about 4 out of 5 strength in all extremities and feels fatigued.  She does have Parkinson's-like features.  Mild resting tremor that does interfere somewhat with her ECG and lead tracings  She is very pleasant calm and without distress.   After receiving IV hydration, the patient continues to have significant weakness requiring significant assistance and inability to transfer herself from toilet to bed without help.  This is a significant departure from her baseline though she does typically use a walker.  She relates though that she feels significantly fatigued   ED Results / Procedures / Treatments   Labs (all labs ordered are listed, but only abnormal results are displayed) Labs Reviewed  RESP PANEL BY RT-PCR (FLU A&B, COVID) ARPGX2 - Abnormal; Notable for the following components:      Result Value   SARS Coronavirus 2  by RT PCR POSITIVE (*)    All other components within normal limits  CBC WITH DIFFERENTIAL/PLATELET - Abnormal; Notable for the following components:   WBC 3.2 (*)    RBC 3.62 (*)    Hemoglobin 11.3 (*)    HCT 33.3 (*)    All other components within normal limits  COMPREHENSIVE METABOLIC PANEL - Abnormal; Notable for the following components:   Potassium 3.3 (*)    AST 62 (*)    All other components within normal limits  VITAMIN B12 - Abnormal; Notable for the following components:   Vitamin B-12 168 (*)    All other components within normal limits  CK - Abnormal; Notable for the following components:   Total CK 2,060 (*)    All other components within normal limits  FOLATE  TSH  VITAMIN B1  BASIC METABOLIC PANEL  MAGNESIUM     RADIOLOGY     PROCEDURES:  Critical Care performed: No  Procedures   MEDICATIONS ORDERED IN ED: Medications  thiamine (VITAMIN B1)  tablet 100 mg (100 mg Oral Given 06/02/23 1104)  enoxaparin (LOVENOX) injection 40 mg (40 mg Subcutaneous Given 06/02/23 1523)  sodium chloride flush (NS) 0.9 % injection 3 mL (3 mLs Intravenous Given 06/02/23 1526)  acetaminophen (TYLENOL) tablet 650 mg (has no administration in time range)    Or  acetaminophen (TYLENOL) suppository 650 mg (has no administration in time range)  polyethylene glycol (MIRALAX / GLYCOLAX) packet 17 g (has no administration in time range)  ondansetron (ZOFRAN) tablet 4 mg (has no administration in time range)    Or  ondansetron (ZOFRAN) injection 4 mg (has no administration in time range)  multivitamin with minerals tablet 1 tablet (1 tablet Oral Given 06/02/23 1521)  folic acid (FOLVITE) tablet 1 mg (has no administration in time range)  carbidopa-levodopa (SINEMET IR) 25-100 MG per tablet immediate release 1.5 tablet (has no administration in time range)  carbidopa-levodopa (SINEMET IR) 25-100 MG per tablet immediate release 1.5 tablet (has no administration in time range)  cyanocobalamin (VITAMIN B12) tablet 1,000 mcg (has no administration in time range)  gabapentin (NEURONTIN) capsule 100 mg (has no administration in time range)  benzonatate (TESSALON) capsule 200 mg (has no administration in time range)  loperamide (IMODIUM) capsule 2 mg (has no administration in time range)  sodium chloride 0.9 % bolus 1,000 mL (0 mLs Intravenous Stopped 06/02/23 1526)  folic acid (FOLVITE) tablet 1 mg (1 mg Oral Given 06/02/23 1117)  potassium chloride SA (KLOR-CON M) CR tablet 40 mEq (40 mEq Oral Given 06/02/23 1521)     IMPRESSION / MDM / ASSESSMENT AND PLAN / ED COURSE  I reviewed the triage vital signs and the nursing notes.                              Differential diagnosis includes, but is not limited to, generalized weakness, fatigue, possible viral illness given her associated relatively rapidly increased and also notable COVID exposure.  Query potential early  viral infection to generalized weakness.  She has also suffered a fall but denies any injuries moves all extremities well no hip pain no discomfort.  Normocephalic atraumatic no cervical tenderness.  No motor deficits that are focal  Labs reviewed, notable for positive COVID test.  CT head without acute finding chest x-ray negative for acute finding.  Will hydrate.  She is somewhat frail at baseline, and I suspect with her underlying Parkinson's  is having COVID is now leading to significant muscular weakness and inability to care for herself to her baseline and complete ADLs.  She has had 4 falls in the last 24 hours thankfully none without obvious or apparent injury  Mild leukopenia likely viral suppression in the setting.  Mild anemia.  Folate level normal.  CK level followed up is 2000 indicative of rhabdomyolysis, hospitalist had ordered this.  Mild hypokalemia  Given the patient's ongoing debility and weakness despite hydration frail status and fatigue COVID positivity with the patient will admit for further observation and treatment to hospitalist.  Case and care accepted by Dr. Huel Cote  Patient's presentation is most consistent with acute complicated illness / injury requiring diagnostic workup.   The patient is on the cardiac monitor to evaluate for evidence of arrhythmia and/or significant heart rate changes.  Patient understanding agreeable with plan for admission and diagnosis     FINAL CLINICAL IMPRESSION(S) / ED DIAGNOSES   Final diagnoses:  COVID-19  General weakness  Ambulatory dysfunction     Rx / DC Orders   ED Discharge Orders     None        Note:  This document was prepared using Dragon voice recognition software and may include unintentional dictation errors.   Sharyn Creamer, MD 06/02/23 2035

## 2023-06-02 NOTE — Assessment & Plan Note (Signed)
Patient has a history of previous severe B12 deficiency.  - B12 level pending - Continue home B12 supplementation

## 2023-06-02 NOTE — ED Triage Notes (Addendum)
BIB [facility] from home. Pt c/o being COVID+ and had 2 mechanical falls last night. Denies hitting head, LOC, or daily thinners. Pt alert and oriented. Pt lives with family. Pt breathing unlabored. Denies chest pain or pressure.     128/78 98% RA HR 88 98.5 temp.

## 2023-06-02 NOTE — ED Notes (Signed)
Checked for output via purewick. Reassured pt that purewick was in place and she is able to urinate if she could. Pt stated she would try.

## 2023-06-02 NOTE — H&P (Signed)
History and Physical    Patient: Michele Brewer ZOX:[id number] DOB: 05-07-1951 DOA: 06/02/2023 DOS: the patient was seen and examined on 06/02/2023 PCP: Barbette Reichmann, MD  Patient coming from: Home  Chief Complaint:  Chief Complaint  Patient presents with   Fall   HPI: Michele Brewer is a 72 y.o. female with medical history significant of Parkinson's disease, osteopenia, frequent falls, malnutrition with multiple vitamin deficiencies, who presents to the ED due to multiple falls.  Michele Brewer states that her mother initially had COVID several days ago and then on 9/12, she tested herself for COVID at home and was positive.  During this time, she has noticed increasing fatigue, mild productive cough, and some congestion.  She endorses nonbloody, nonmelanotic diarrhea but denies any nausea/vomiting.  She notes that since her fatigue has worsened, when she uses her walker, she feels she cannot hold up her own weight.  Due to this, she has had approximately 4 falls with the most recent in the early hours of today when she fell down onto a pile of laundry and laid there for 4 hours until her family was able to help her up.  At this time, she denies any joint pain or headache.  She denies any focal weakness, stating it is generalized.  ED course: On arrival to the ED, patient was normotensive at 121/59 with heart rate of 65.  She was saturating at 100% on room air.  She was afebrile at 98.3. Initial workup demonstrated WBC of 3.2, hemoglobin of 11.3, potassium 3.3, creatinine 0.72, AST 62, GFR above 60.  COVID-19 PCR positive.  CT of the head with no acute intracranial abnormalities.  Chest x-ray with no active disease.  [facility] contacted for admission.  Review of Systems: As mentioned in the history of present illness. All other systems reviewed and are negative.  Past Medical History:  Diagnosis Date   Depression    Parkinson disease    Past Surgical History:  Procedure Laterality Date    CESAREAN SECTION     Social History:  reports that she has never smoked. She has never used smokeless tobacco. She reports that she does not currently use alcohol. She reports that she does not use drugs.  Allergies  Allergen Reactions   Citrus     rash   Peanut-Containing Drug Products     Rash and itch    Family History  Problem Relation Age of Onset   Gout Mother    Heart disease Father     Prior to Admission medications   Medication Sig Start Date End Date Taking? Authorizing Provider  acetaminophen (TYLENOL) 325 MG tablet Take 2 tablets (650 mg total) by mouth every 6 (six) hours as needed for mild pain, moderate pain, fever or headache (or Fever >/= 101). 07/18/22   Alford Highland, MD  carbidopa-levodopa (SINEMET IR) 25-100 MG tablet Take 1.5 tablets by mouth 2 (two) times daily. 07/18/22   Alford Highland, MD  carbidopa-levodopa (SINEMET IR) 25-100 MG tablet Take 1.5 tablets by mouth at bedtime. 07/18/22   Alford Highland, MD  cyanocobalamin 1000 MCG tablet Take 1 tablet (1,000 mcg total) by mouth daily. 07/19/22   Alford Highland, MD  DULoxetine (CYMBALTA) 30 MG capsule Take 1 capsule (30 mg total) by mouth 2 (two) times daily. 07/18/22   Alford Highland, MD  DULoxetine (CYMBALTA) 30 MG capsule Take 1 capsule (30 mg total) by mouth 2 (two) times daily. 11/22/22     folic acid (FOLVITE) 1 MG tablet Take  1 tablet (1 mg total) by mouth daily. 07/19/22   Alford Highland, MD  gabapentin (NEURONTIN) 100 MG capsule Take 100 mg by mouth 3 (three) times daily. 09/08/19   [provider]  gabapentin (NEURONTIN) 100 MG capsule Take 1 capsule (100 mg total) by mouth 3 (three) times daily. 11/22/22     Multiple Vitamin (MULTIVITAMIN WITH MINERALS) TABS tablet Take 1 tablet by mouth daily. 07/19/22   Alford Highland, MD  Nutritional Supplements (FEEDING SUPPLEMENT, NEPRO CARB STEADY,) LIQD Take 237 mLs by mouth 3 (three) times daily between meals. 07/18/22   Alford Highland, MD   polyethylene glycol (MIRALAX / GLYCOLAX) 17 g packet Take 17 g by mouth daily as needed for moderate constipation. 07/18/22   Alford Highland, MD  thiamine (VITAMIN B-1) 100 MG tablet Take 1 tablet (100 mg total) by mouth daily. 07/19/22   Alford Highland, MD  Vitamin D, Ergocalciferol, (DRISDOL) 1.25 MG (50000 UNIT) CAPS capsule Take 1 capsule (50,000 Units total) by mouth every 7 (seven) days. 07/23/22   Alford Highland, MD    Physical Exam: Vitals:   06/02/23 1030 06/02/23 1059 06/02/23 1430 06/02/23 1528  BP: (!) 121/59  128/68   Pulse: 65  65   Resp: 17  17   Temp:  98.3 F (36.8 C)  98.2 F (36.8 C)  TempSrc:  Oral  Oral  SpO2: 100%  99%   Weight:      Height:       Physical Exam Vitals and nursing note reviewed.  Constitutional:      General: She is not in acute distress.    Appearance: She is normal weight. She is not toxic-appearing.  HENT:     Head: Normocephalic and atraumatic.     Mouth/Throat:     Mouth: Mucous membranes are moist.     Pharynx: Oropharynx is clear.  Eyes:     Conjunctiva/sclera: Conjunctivae normal.     Pupils: Pupils are equal, round, and reactive to light.  Cardiovascular:     Rate and Rhythm: Normal rate and regular rhythm.     Heart sounds: No murmur heard.    No gallop.  Pulmonary:     Effort: Pulmonary effort is normal. No respiratory distress.     Breath sounds: Normal breath sounds. No wheezing, rhonchi or rales.  Abdominal:     General: Bowel sounds are normal. There is no distension.     Palpations: Abdomen is soft.     Tenderness: There is no abdominal tenderness. There is no guarding.  Neurological:     Mental Status: She is alert and oriented to person, place, and time. Mental status is at baseline.     Motor: Tremor (Fine, pill rolling tremors noted.) present.     Comments: No focal weakness, diffusely 4/5    Data Reviewed: CBC with WBC of 3.2, hemoglobin of 11.3, platelets of 162 CMP with sodium of 137, potassium 3.3,  bicarb 26, BUN 21, creatinine 0.71, AST 62, ALT 24 and GFR above 60 Folate 30.2 TSH 0.77 COVID-19 PCR positive  CT Head Wo Contrast  Result Date: 06/02/2023 CLINICAL DATA:  Fall from standing with head pain. EXAM: CT HEAD WITHOUT CONTRAST TECHNIQUE: Contiguous axial images were obtained from the base of the skull through the vertex without intravenous contrast. RADIATION DOSE REDUCTION: This exam was performed according to the departmental dose-optimization program which includes automated exposure control, adjustment of the mA and/or kV according to patient size and/or use of iterative reconstruction technique. COMPARISON:  CT head dated 07/14/2022. FINDINGS: Brain: No evidence of acute infarction, hemorrhage, hydrocephalus, extra-axial collection or mass lesion/mass effect. There is mild cerebral volume loss with associated ex vacuo dilatation. Vascular: There are vascular calcifications in the carotid siphons. Skull: Normal. Negative for fracture or focal lesion. Sinuses/Orbits: There is right maxillary sinus disease and left ethmoid sinus disease. There is a small left mastoid effusion. Other: None. IMPRESSION: 1. No acute intracranial pathology. Electronically Signed   By: Romona Curls M.D.   On: 06/02/2023 10:03   DG Chest 2 View  Result Date: 06/02/2023 CLINICAL DATA:  Cough, fatigue. EXAM: CHEST - 2 VIEW COMPARISON:  Chest radiograph dated 07/14/2022. FINDINGS: The heart size and mediastinal contours are within normal limits. Vascular calcifications are seen in the aortic arch. Both lungs are clear. Degenerative changes are seen in the spine. IMPRESSION: No active cardiopulmonary disease. Electronically Signed   By: Romona Curls M.D.   On: 06/02/2023 09:58    Results are pending, will review when available.  Assessment and Plan:  * Ambulatory dysfunction Patient has a long-term history of ambulatory dysfunction and frequent falls in the setting of Parkinson's disease and vertigo.  Acute  worsening in the setting of COVID-19 infection.  Patient did fall and lay on the floor for several hours, so we will check CK.  Low suspicion for central process including CVA.  - PT/OT - CK pending  COVID-19 virus infection Patient presenting with 2-day history of worsening fatigue, cough and congestion with COVID-19 PCR positive. Labs notable for mild leukopenia and elevated AST consistent with COVID. No evidence of SOB, hypoxia or lower respiratory involvement. Given diarrhea with COVID, will hold off on Paxlovid.   - Imodium as needed for diarrhea - Tessalon Perles as needed for cough - S/p 1 L fluids  Vitamin B12 deficiency Patient has a history of previous severe B12 deficiency.  - B12 level pending - Continue home B12 supplementation  Parkinson disease - Continue home regimen  Advance Care Planning:   Code Status: Full Code   Consults: None  Family Communication: No family at bedside.   Severity of Illness: The appropriate patient status for this patient is OBSERVATION. Observation status is judged to be reasonable and necessary in order to provide the required intensity of service to ensure the patient's safety. The patient's presenting symptoms, physical exam findings, and initial radiographic and laboratory data in the context of their medical condition is felt to place them at decreased risk for further clinical deterioration. Furthermore, it is anticipated that the patient will be medically stable for discharge from the hospital within 2 midnights of admission.   Author: Verdene Lennert, MD 06/02/2023 5:07 PM  For on call review [email or web address].

## 2023-06-02 NOTE — ED Notes (Signed)
Pt unsteady getting from wheelchair to the bed.

## 2023-06-02 NOTE — Assessment & Plan Note (Addendum)
Patient has a long-term history of ambulatory dysfunction and frequent falls in the setting of Parkinson's disease and vertigo.  Acute worsening in the setting of COVID-19 infection.  Patient did fall and lay on the floor for several hours, so we will check CK.  Low suspicion for central process including CVA.  - PT/OT - CK pending

## 2023-06-02 NOTE — Assessment & Plan Note (Signed)
-  Continue home regimen

## 2023-06-03 DIAGNOSIS — A0839 Other viral enteritis: Secondary | ICD-10-CM | POA: Diagnosis not present

## 2023-06-03 DIAGNOSIS — R748 Abnormal levels of other serum enzymes: Secondary | ICD-10-CM

## 2023-06-03 DIAGNOSIS — E876 Hypokalemia: Secondary | ICD-10-CM | POA: Diagnosis not present

## 2023-06-03 DIAGNOSIS — G20A1 Parkinson's disease without dyskinesia, without mention of fluctuations: Secondary | ICD-10-CM | POA: Diagnosis not present

## 2023-06-03 DIAGNOSIS — M6282 Rhabdomyolysis: Secondary | ICD-10-CM | POA: Diagnosis not present

## 2023-06-03 DIAGNOSIS — Z8249 Family history of ischemic heart disease and other diseases of the circulatory system: Secondary | ICD-10-CM | POA: Diagnosis not present

## 2023-06-03 DIAGNOSIS — R42 Dizziness and giddiness: Secondary | ICD-10-CM | POA: Diagnosis not present

## 2023-06-03 DIAGNOSIS — G20B2 Parkinson's disease with dyskinesia, with fluctuations: Secondary | ICD-10-CM

## 2023-06-03 DIAGNOSIS — Z79899 Other long term (current) drug therapy: Secondary | ICD-10-CM | POA: Diagnosis not present

## 2023-06-03 DIAGNOSIS — W19XXXA Unspecified fall, initial encounter: Secondary | ICD-10-CM | POA: Diagnosis present

## 2023-06-03 DIAGNOSIS — U071 COVID-19: Secondary | ICD-10-CM

## 2023-06-03 DIAGNOSIS — Y92009 Unspecified place in unspecified non-institutional (private) residence as the place of occurrence of the external cause: Secondary | ICD-10-CM | POA: Diagnosis not present

## 2023-06-03 DIAGNOSIS — R262 Difficulty in walking, not elsewhere classified: Secondary | ICD-10-CM | POA: Diagnosis not present

## 2023-06-03 DIAGNOSIS — R531 Weakness: Secondary | ICD-10-CM | POA: Diagnosis present

## 2023-06-03 DIAGNOSIS — E538 Deficiency of other specified B group vitamins: Secondary | ICD-10-CM

## 2023-06-03 DIAGNOSIS — F32A Depression, unspecified: Secondary | ICD-10-CM | POA: Diagnosis not present

## 2023-06-03 DIAGNOSIS — Z91018 Allergy to other foods: Secondary | ICD-10-CM | POA: Diagnosis not present

## 2023-06-03 DIAGNOSIS — Z91010 Allergy to peanuts: Secondary | ICD-10-CM | POA: Diagnosis not present

## 2023-06-03 LAB — BASIC METABOLIC PANEL WITH GFR
Anion gap: 9 (ref 5–15)
BUN: 20 mg/dL (ref 8–23)
CO2: 25 mmol/L (ref 22–32)
Calcium: 8.5 mg/dL — ABNORMAL LOW (ref 8.9–10.3)
Chloride: 103 mmol/L (ref 98–111)
Creatinine, Ser: 0.6 mg/dL (ref 0.44–1.00)
GFR, Estimated: >60 mL/min
Glucose, Bld: 78 mg/dL (ref 70–99)
Potassium: 3.7 mmol/L (ref 3.5–5.1)
Sodium: 137 mmol/L (ref 135–145)

## 2023-06-03 LAB — MAGNESIUM: Magnesium: 2 mg/dL (ref 1.7–2.4)

## 2023-06-03 MED ORDER — VITAMIN D (ERGOCALCIFEROL) 1.25 MG (50000 UNIT) PO CAPS
50000.0000 [IU] | ORAL_CAPSULE | ORAL | Status: DC
  Administered 2023-06-03: 50000 [IU] via ORAL
  Filled 2023-06-03: qty 1

## 2023-06-03 MED ORDER — INFLUENZA VAC A&B SURF ANT ADJ 0.5 ML IM SUSY: 0.5000 mL | PREFILLED_SYRINGE | INTRAMUSCULAR | Status: DC | PRN

## 2023-06-03 MED ORDER — CYANOCOBALAMIN 1000 MCG/ML IJ SOLN
1000.0000 ug | INTRAMUSCULAR | Status: DC
  Administered 2023-06-03 – 2023-06-09 (×6): 1000 ug via SUBCUTANEOUS
  Filled 2023-06-03 (×7): qty 1

## 2023-06-03 MED ORDER — NEPRO/CARBSTEADY PO LIQD
237.0000 mL | Freq: Three times a day (TID) | ORAL | Status: DC
  Administered 2023-06-03 – 2023-06-06 (×4): 237 mL via ORAL

## 2023-06-03 MED ORDER — ZINC SULFATE 220 (50 ZN) MG PO CAPS
220.0000 mg | ORAL_CAPSULE | Freq: Every day | ORAL | Status: DC
  Administered 2023-06-03 – 2023-06-09 (×7): 220 mg via ORAL
  Filled 2023-06-03 (×7): qty 1

## 2023-06-03 MED ORDER — LACTATED RINGERS IV SOLN: INTRAVENOUS | Status: AC

## 2023-06-03 MED ORDER — PANTOPRAZOLE SODIUM 40 MG PO TBEC
40.0000 mg | DELAYED_RELEASE_TABLET | Freq: Every day | ORAL | Status: DC
  Administered 2023-06-03 – 2023-06-09 (×7): 40 mg via ORAL
  Filled 2023-06-03 (×7): qty 1

## 2023-06-03 MED ORDER — DULOXETINE HCL 30 MG PO CPEP
30.0000 mg | ORAL_CAPSULE | Freq: Two times a day (BID) | ORAL | Status: DC
  Administered 2023-06-03 – 2023-06-09 (×13): 30 mg via ORAL
  Filled 2023-06-03 (×13): qty 1

## 2023-06-03 MED ORDER — VITAMIN C 500 MG PO TABS
500.0000 mg | ORAL_TABLET | Freq: Every day | ORAL | Status: DC
  Administered 2023-06-03 – 2023-06-09 (×7): 500 mg via ORAL
  Filled 2023-06-03 (×7): qty 1

## 2023-06-03 MED ORDER — LORATADINE 10 MG PO TABS
10.0000 mg | ORAL_TABLET | Freq: Every day | ORAL | Status: DC
  Administered 2023-06-03 – 2023-06-09 (×7): 10 mg via ORAL
  Filled 2023-06-03 (×7): qty 1

## 2023-06-03 MED ORDER — FOLIC ACID 1 MG PO TABS: 1.0000 mg | ORAL_TABLET | Freq: Every day | ORAL | Status: DC

## 2023-06-03 MED ORDER — FLUTICASONE PROPIONATE 50 MCG/ACT NA SUSP
2.0000 | Freq: Every day | NASAL | Status: DC
  Administered 2023-06-03 – 2023-06-08 (×6): 2 via NASAL
  Filled 2023-06-03: qty 16

## 2023-06-03 MED ORDER — NIRMATRELVIR/RITONAVIR (PAXLOVID)TABLET
3.0000 | ORAL_TABLET | Freq: Two times a day (BID) | ORAL | Status: AC
  Administered 2023-06-03 – 2023-06-07 (×10): 3 via ORAL
  Filled 2023-06-03: qty 30

## 2023-06-03 MED ORDER — THIAMINE MONONITRATE 100 MG PO TABS: 100.0000 mg | ORAL_TABLET | Freq: Every day | ORAL | Status: DC

## 2023-06-03 MED ORDER — IPRATROPIUM-ALBUTEROL 20-100 MCG/ACT IN AERS
1.0000 | INHALATION_SPRAY | Freq: Four times a day (QID) | RESPIRATORY_TRACT | Status: DC
  Administered 2023-06-03 – 2023-06-09 (×24): 1 via RESPIRATORY_TRACT
  Filled 2023-06-03 (×2): qty 4

## 2023-06-03 MED ORDER — ADULT MULTIVITAMIN W/MINERALS CH: 1.0000 | ORAL_TABLET | Freq: Every day | ORAL | Status: DC

## 2023-06-03 NOTE — Evaluation (Signed)
Physical Therapy Evaluation Patient Details Name: Michele Brewer MRN: [id number] DOB: 09/30/1950 Today's Date: 06/03/2023  History of Present Illness  Michele Brewer is a 72 y.o. female with medical history significant of Parkinson's disease, osteopenia, frequent falls, malnutrition with multiple vitamin deficiencies, who presents to the ED due to multiple falls.   Clinical Impression  Pt admitted with above diagnosis. Pt currently with functional limitations due to the deficits listed below (see PT Problem List). Pt received upright in bed agreeable to PT/OT co-eval due to pt's medical complexity and pt safety due to falls risk. PTA pt reports being mod-I with her RW for household and short community distances and takes care of her mother and still drives. Indep with her ADL's/IADL's.   To date, pt slow moving relying on bed features to transfer to EOB CGA fading to supervision. Intermittent bouts of reported dizziness throughout transfer but this is chronic in nature to pt per her reports and medical chart. Generally able to boost herself to Eob but does display posterior lean in sitting reliant on cuing to correct with good carryover. MinA+1 to stand with VC's for hand placement and CGA provided for step pivot transfer to recliner with consistent posterior bias but also easily correct with cuing. Pt with mild festination of gait with steps requiring increased time to transfer but generally steady despite. Poor eccentric control noted with return to sitting with mod VC's required for appropriate and safe RW navigation and hand use for sitting. Pt with all needs in reach in recliner. Pt will benefit from skilled PT services to improved her LE weakness and functional mobility prior to return to home setting to maximize functional independence.      If plan is discharge home, recommend the following: A little help with walking and/or transfers;Assistance with cooking/housework;Help with stairs or ramp for  entrance   Can travel by private vehicle   No    Equipment Recommendations Other (comment) (TBD by next venue of care)  Recommendations for Other Services       Functional Status Assessment Patient has had a recent decline in their functional status and demonstrates the ability to make significant improvements in function in a reasonable and predictable amount of time.     Precautions / Restrictions Precautions Precautions: Fall Precaution Comments: COVID Restrictions Weight Bearing Restrictions: No      Mobility  Bed Mobility Overal bed mobility: Needs Assistance Bed Mobility: Supine to Sit     Supine to sit: Contact guard, Supervision, Used rails, HOB elevated     General bed mobility comments: CGA to supervision. INcreased time and bed features. Uses bed rail at baseline at home. Patient Response: Cooperative, Flat affect  Transfers Overall transfer level: Needs assistance Equipment used: Rolling walker (2 wheels) Transfers: Sit to/from Stand, Bed to chair/wheelchair/BSC Sit to Stand: Min assist   Step pivot transfers: Contact guard assist       General transfer comment: VC's for safe hand placement and sequencing RW with steps to recliner.    Ambulation/Gait                  Stairs            Wheelchair Mobility     Tilt Bed Tilt Bed Patient Response: Cooperative, Flat affect  Modified Rankin (Stroke Patients Only)       Balance Overall balance assessment: Needs assistance Sitting-balance support: Bilateral upper extremity supported, Feet supported Sitting balance-Leahy Scale: Poor Sitting balance - Comments: consistent posterior lean. Can  correct with VC's.   Standing balance support: Bilateral upper extremity supported, During functional activity, Reliant on assistive device for balance Standing balance-Leahy Scale: Poor Standing balance comment: POsterior lean also present in standing but less significant. Corrects and  maintains neutral positioning post cues                             Pertinent Vitals/Pain Pain Assessment Pain Assessment: Faces Faces Pain Scale: No hurt    Home Living Family/patient expects to be discharged to:: Private residence Living Arrangements: Other relatives (mother and sister) Available Help at Discharge: Family Type of Home: Mobile home Home Access: Ramped entrance       Home Layout: One level Home Equipment: BSC/3in1;Cane - single point;Toilet riser;Shower seat;Rolling Environmental consultant (2 wheels)      Prior Function Prior Level of Function : Independent/Modified Independent             Mobility Comments: uses RW for mobility for household and short community distances. ADLs Comments: Still drives.     Extremity/Trunk Assessment   Upper Extremity Assessment Upper Extremity Assessment: Defer to OT evaluation    Lower Extremity Assessment Lower Extremity Assessment: Generalized weakness       Communication   Communication Communication: No apparent difficulties Cueing Techniques: Verbal cues  Cognition Arousal: Alert Behavior During Therapy: WFL for tasks assessed/performed Overall Cognitive Status: Within Functional Limits for tasks assessed                                 General Comments: Very pleasant and cooperative        General Comments      Exercises     Assessment/Plan    PT Assessment Patient needs continued PT services  PT Problem List Decreased strength;Decreased knowledge of use of DME;Decreased activity tolerance;Decreased balance;Decreased mobility       PT Treatment Interventions DME instruction;Gait training;Patient/family education;Functional mobility training;Therapeutic activities;Therapeutic exercise;Balance training;Neuromuscular re-education;Canalith reposition    PT Goals (Current goals can be found in the Care Plan section)  Acute Rehab PT Goals Patient Stated Goal: to return home PT Goal  Formulation: With patient Time For Goal Achievement: 06/17/23 Potential to Achieve Goals: Fair    Frequency Min 1X/week     Co-evaluation PT/OT/SLP Co-Evaluation/Treatment: Yes Reason for Co-Treatment: Complexity of the patient's impairments (multi-system involvement);For patient/therapist safety;To address functional/ADL transfers PT goals addressed during session: Mobility/safety with mobility;Proper use of DME         AM-PAC PT "6 Clicks" Mobility  Outcome Measure Help needed turning from your back to your side while in a flat bed without using bedrails?: A Lot Help needed moving from lying on your back to sitting on the side of a flat bed without using bedrails?: A Lot Help needed moving to and from a bed to a chair (including a wheelchair)?: A Little Help needed standing up from a chair using your arms (e.g., wheelchair or bedside chair)?: A Little Help needed to walk in hospital room?: A Lot Help needed climbing 3-5 steps with a railing? : A Lot 6 Click Score: 14    End of Session Equipment Utilized During Treatment: Gait belt Activity Tolerance: Patient tolerated treatment well Patient left: in chair;with call bell/phone within reach;with chair alarm set Nurse Communication: Mobility status PT Visit Diagnosis: Unsteadiness on feet (R26.81);Other abnormalities of gait and mobility (R26.89);Muscle weakness (generalized) (M62.81);History of falling (Z91.81);Difficulty  in walking, not elsewhere classified (R26.2);Other symptoms and signs involving the nervous system (R29.898)    Time: 6045-4098 PT Time Calculation (min) (ACUTE ONLY): 21 min   Charges:   PT Evaluation $PT Eval Low Complexity: 1 Low   PT General Charges $$ ACUTE PT VISIT: 1 Visit         Delphia Grates. Fairly IV, PT, DPT Physical Therapist-   [facility]  06/03/2023, 2:35 PM

## 2023-06-03 NOTE — Evaluation (Signed)
Occupational Therapy Evaluation Patient Details Name: Michele Brewer MRN: [id number] DOB: Feb 03, 1951 Today's Date: 06/03/2023   History of Present Illness Pt is a 72 year old female presenting to the ED with multiple falls, COVID 19+    PMH significant for Parkinson's disease, osteopenia, frequent falls, malnutrition with multiple vitamin deficiencies   Clinical Impression   Chart reviewed, pt greeted in bed, agreeable to OT evaluation. Co tx completed with PT on this date. Pt is alert and oriented x4. PTA pt is generally MOD I in ADL/IADL, drives and assists in caregiving for her mother. She amb with RW and has had multiple falls PTA. Pt presents with deficits in strength, endurance, activity tolerance, balance affecting safe and optimal ADL completion. MIN A required for bed mobility with pt reporting s/s vertigo with posterior lean noted while seated edge of bed. Pt self corrects with verbal/tactile cues. CGA required for short amb transfer to bedside chair, pt reports fatigue with short bout of activity. SET UP required for feeding/grooming tasks, MAX A for LB dressing. Pt is preforming ADL/functional mobility below PLOF, would benefit from ongoing OT to address deficits and to facilitate return to PLOF. OT will follow acutely.       If plan is discharge home, recommend the following: A little help with walking and/or transfers;A lot of help with bathing/dressing/bathroom;Assistance with cooking/housework;Help with stairs or ramp for entrance;Assist for transportation    Functional Status Assessment  Patient has had a recent decline in their functional status and demonstrates the ability to make significant improvements in function in a reasonable and predictable amount of time.  Equipment Recommendations  None recommended by OT;Other (comment) (pt has recommended equipment)    Recommendations for Other Services       Precautions / Restrictions Precautions Precautions: Fall Precaution  Comments: COVID Restrictions Weight Bearing Restrictions: No      Mobility Bed Mobility Overal bed mobility: Needs Assistance Bed Mobility: Supine to Sit     Supine to sit: Contact guard, Supervision, Used rails, HOB elevated          Transfers Overall transfer level: Needs assistance   Transfers: Sit to/from Stand Sit to Stand: Min assist                  Balance Overall balance assessment: Needs assistance Sitting-balance support: Bilateral upper extremity supported, Feet supported Sitting balance-Leahy Scale: Fair   Postural control: Posterior lean Standing balance support: Bilateral upper extremity supported, During functional activity, Reliant on assistive device for balance Standing balance-Leahy Scale: Poor                             ADL either performed or assessed with clinical judgement   ADL Overall ADL's : Needs assistance/impaired Eating/Feeding: Set up;Sitting   Grooming: Wash/dry face;Sitting;Set up           Upper Body Dressing : Minimal assistance;Sitting Upper Body Dressing Details (indicate cue type and reason): anticipated Lower Body Dressing: Maximal assistance Lower Body Dressing Details (indicate cue type and reason): anticipated, pt unable to bend forward on this date Toilet Transfer: Contact guard assist;Rolling walker (2 wheels) Toilet Transfer Details (indicate cue type and reason): simulated, short amb transfer to bed side chair, intermittent vcs for technique                 Vision Patient Visual Report: No change from baseline (pt reporting vertigo symptoms)       Perception  Praxis         Pertinent Vitals/Pain Pain Assessment Pain Assessment: No/denies pain     Extremity/Trunk Assessment Upper Extremity Assessment Upper Extremity Assessment: Generalized weakness (tremors noted throughout)   Lower Extremity Assessment Lower Extremity Assessment: Generalized weakness        Communication Communication Communication: No apparent difficulties Cueing Techniques: Verbal cues   Cognition Arousal: Alert Behavior During Therapy: Flat affect, WFL for tasks assessed/performed Overall Cognitive Status: Within Functional Limits for tasks assessed                                       General Comments  spo2 >90% on RA throughout    Exercises Other Exercises Other Exercises: edu re: role of OT, role of rehab, discharge recommendations, safe ADL completion   Shoulder Instructions      Home Living Family/patient expects to be discharged to:: Private residence Living Arrangements: Other relatives (mother and sister) Available Help at Discharge: Family Type of Home: Mobile home Home Access: Ramped entrance     Home Layout: One level     Bathroom Shower/Tub: Arts development officer Toilet: Handicapped height     Home Equipment: BSC/3in1;Cane - single point;Toilet riser;Shower Counsellor (2 wheels)   Additional Comments: 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      Prior Functioning/Environment Prior Level of Function : Independent/Modified Independent;Driving             Mobility Comments: amb with RW household/short community distances ADLs Comments: generally MOD I with ADL,assists her mother with her ADL/IADLs        OT Problem List: Decreased strength;Decreased activity tolerance;Impaired balance (sitting and/or standing);Decreased knowledge of use of DME or AE      OT Treatment/Interventions: Self-care/ADL training;Therapeutic exercise;Balance training;Therapeutic activities;DME and/or AE instruction;Patient/family education;Energy conservation    OT Goals(Current goals can be found in the care plan section) Acute Rehab OT Goals Patient Stated Goal: get strong enough to go home OT Goal Formulation: With patient Time For Goal Achievement: 06/17/23 Potential to Achieve Goals: Fair ADL Goals Pt Will Perform Grooming:  with modified independence;sitting Pt Will Perform Lower Body Dressing: with modified independence;sitting/lateral leans Pt Will Transfer to Toilet: with modified independence;ambulating Pt Will Perform Toileting - Clothing Manipulation and hygiene: with modified independence;sit to/from stand  OT Frequency: Min 1X/week    Co-evaluation PT/OT/SLP Co-Evaluation/Treatment: Yes Reason for Co-Treatment: Complexity of the patient's impairments (multi-system involvement);For patient/therapist safety;To address functional/ADL transfers PT goals addressed during session: Mobility/safety with mobility;Proper use of DME OT goals addressed during session: ADL's and self-care      AM-PAC OT "6 Clicks" Daily Activity     Outcome Measure Help from another person eating meals?: None Help from another person taking care of personal grooming?: None Help from another person toileting, which includes using toliet, bedpan, or urinal?: A Lot Help from another person bathing (including washing, rinsing, drying)?: A Lot Help from another person to put on and taking off regular upper body clothing?: A Little Help from another person to put on and taking off regular lower body clothing?: A Lot 6 Click Score: 17   End of Session Equipment Utilized During Treatment: Gait belt;Rolling walker (2 wheels) Nurse Communication: Mobility status  Activity Tolerance: Patient tolerated treatment well Patient left: in chair;with call bell/phone within reach;with chair alarm set  OT Visit Diagnosis: Other abnormalities of gait and mobility (R26.89);Muscle weakness (  generalized) (M62.81)                Time: 1914-7829 OT Time Calculation (min): 19 min Charges:  OT General Charges $OT Visit: 1 Visit OT Evaluation $OT Eval Moderate Complexity: 1 Mod Oleta Mouse, OTD OTR/L  06/03/23, 4:10 PM

## 2023-06-03 NOTE — Progress Notes (Signed)
PROGRESS NOTE    Michele Brewer  QVZ:[id number] DOB: Aug 01, 1951 DOA: 06/02/2023 PCP: Barbette Reichmann, MD    Chief Complaint  Patient presents with   Fall    Brief Narrative:  Patient 72 year old female with history of PD, osteopenia, frequent falls, malnutrition with multiple vitamin deficiencies, presented to ED with multiple falls. Patient noted to have recently tested positive for COVID. Patient noted to have elevated CK, CT head negative.   Assessment & Plan:   Principal Problem:   Ambulatory dysfunction Active Problems:   COVID-19 virus infection   Parkinson disease   Vitamin B12 deficiency   Elevated CK  #1 ambulatory dysfunction/falls -Patient with history of ambulatory dysfunction and frequent falls in the setting of Parkinson disease with vertigo. -Patient noted to have worsening lower laboratory dysfunction and frequent falls in the setting of COVID-19 infection. -CK elevated. -Placed on aggressive fluid resuscitation for the next 24 hours. -PT/OT.  2.  COVID-19 virus infection -Patient presented with 2-day history of worsening fatigue, cough, congestion, COVID-19 PCR positive. -Patient with no significant shortness of breath.  No hypoxia. -Start patient on Paxlovid. -Placed on vitamin C, zinc, scheduled Combivent, Flonase, Claritin, PPI.  3.  Vitamin B12 deficiency -Vitamin B12 level of 168. -Vitamin B12 1000 mcg subcu daily x 7 days, then weekly x 1 month, then monthly.  4.  Elevated CK levels -CK of 2060. -Aggressive fluid resuscitation for the next 24 hours. -Repeat labs in AM.  5.  Parkinson's disease -Continue Sinemet.   DVT prophylaxis: Lovenox Code Status: Full Family Communication: Updated patient.  No family at bedside. Disposition: TBD  Status is: Inpatient The patient will require care spanning > 2 midnights and should be moved to inpatient because: Severity of illness   Consultants:  None  Procedures:  CT head 06/02/2023 Chest  x-ray 06/02/2023  Antimicrobials: Anti-infectives (From admission, onward)    Start     Dose/Rate Route Frequency Ordered Stop   06/03/23 1015  nirmatrelvir/ritonavir (PAXLOVID) 3 tablet        3 tablet Oral 2 times daily 06/03/23 0915 06/08/23 0959         Subjective: Patient lying in bed.  Complains of weakness.  Denies any significant shortness of breath or wheezing.  Denies any chest pain or abdominal pain.  Tolerating current diet.  Objective: Vitals:   06/02/23 1753 06/02/23 2001 06/03/23 0453 06/03/23 0825  BP: 119/67 103/64 115/63 118/63  Pulse: 74 86 60 (!) 54  Resp: 18 16 16 18   Temp: 98.6 F (37 C) 99 F (37.2 C) 98.7 F (37.1 C) 98.7 F (37.1 C)  TempSrc:      SpO2: 100% 100% 99% 100%  Weight:      Height:        Intake/Output Summary (Last 24 hours) at 06/03/2023 1857 Last data filed at 06/03/2023 1800 Gross per 24 hour  Intake 848.2 ml  Output 500 ml  Net 348.2 ml   Filed Weights   06/02/23 0651  Weight: 64 kg    Examination:  General exam: Appears calm and comfortable  Respiratory system: Clear to auscultation. Respiratory effort normal. Cardiovascular system: S1 & S2 heard, RRR. No JVD, murmurs, rubs, gallops or clicks. No pedal edema. Gastrointestinal system: Abdomen is nondistended, soft and nontender. No organomegaly or masses felt. Normal bowel sounds heard. Central nervous system: Alert and oriented. No focal neurological deficits. Extremities: Symmetric 5 x 5 power. Skin: No rashes, lesions or ulcers Psychiatry: Judgement and insight appear normal. Mood & affect  appropriate.     Data Reviewed: I have personally reviewed following labs and imaging studies  CBC: Recent Labs  Lab 06/02/23 1040  WBC 3.2*  NEUTROABS 2.0  HGB 11.3*  HCT 33.3*  MCV 92.0  PLT 162    Basic Metabolic Panel: Recent Labs  Lab 06/02/23 1040 06/03/23 0554  NA 137 137  K 3.3* 3.7  CL 100 103  CO2 26 25  GLUCOSE 84 78  BUN 21 20  CREATININE 0.72  0.60  CALCIUM 9.1 8.5*  MG  --  2.0    GFR: Estimated Creatinine Clearance: 54.1 mL/min (by C-G formula based on SCr of 0.6 mg/dL).  Liver Function Tests: Recent Labs  Lab 06/02/23 1040  AST 62*  ALT 24  ALKPHOS 87  BILITOT 1.2  PROT 7.1  ALBUMIN 3.6    CBG: No results for input(s): "GLUCAP" in the last 168 hours.   Recent Results (from the past 240 hour(s))  Resp Panel by RT-PCR (Flu A&B, Covid) Anterior Nasal Swab     Status: Abnormal   Collection Time: 06/02/23 10:40 AM   Specimen: Anterior Nasal Swab  Result Value Ref Range Status   SARS Coronavirus 2 by RT PCR POSITIVE (A) NEGATIVE Final    Comment: (NOTE) SARS-CoV-2 target nucleic acids are DETECTED.  The SARS-CoV-2 RNA is generally detectable in upper respiratory specimens during the acute phase of infection. Positive results are indicative of the presence of the identified virus, but do not rule out bacterial infection or co-infection with other pathogens not detected by the test. Clinical correlation with patient history and other diagnostic information is necessary to determine patient infection status. The expected result is Negative.  Fact Sheet for Patients: BloggerCourse.com  Fact Sheet for Healthcare Providers: SeriousBroker.it  This test is not yet approved or cleared by the [place] FDA and  has been authorized for detection and/or diagnosis of SARS-CoV-2 by FDA under an Emergency Use Authorization (EUA).  This EUA will remain in effect (meaning this test can be used) for the duration of  the COVID-19 declaration under Section 564(b)(1) of the A ct, 21 U.S.C. section 360bbb-3(b)(1), unless the authorization is terminated or revoked sooner.     Influenza A by PCR NEGATIVE NEGATIVE Final   Influenza B by PCR NEGATIVE NEGATIVE Final    Comment: (NOTE) The Xpert Xpress SARS-CoV-2/FLU/RSV plus assay is intended as an aid in the diagnosis of  influenza from Nasopharyngeal swab specimens and should not be used as a sole basis for treatment. Nasal washings and aspirates are unacceptable for Xpert Xpress SARS-CoV-2/FLU/RSV testing.  Fact Sheet for Patients: BloggerCourse.com  Fact Sheet for Healthcare Providers: SeriousBroker.it  This test is not yet approved or cleared by the [place] FDA and has been authorized for detection and/or diagnosis of SARS-CoV-2 by FDA under an Emergency Use Authorization (EUA). This EUA will remain in effect (meaning this test can be used) for the duration of the COVID-19 declaration under Section 564(b)(1) of the Act, 21 U.S.C. section 360bbb-3(b)(1), unless the authorization is terminated or revoked.  Performed at [facility], [address] [place], [place] [address]          Radiology Studies: CT Head Wo Contrast  Result Date: 06/02/2023 CLINICAL DATA:  Fall from standing with head pain. EXAM: CT HEAD WITHOUT CONTRAST TECHNIQUE: Contiguous axial images were obtained from the base of the skull through the vertex without intravenous contrast. RADIATION DOSE REDUCTION: This exam was performed according to the departmental dose-optimization  program which includes automated exposure control, adjustment of the mA and/or kV according to patient size and/or use of iterative reconstruction technique. COMPARISON:  CT head dated 07/14/2022. FINDINGS: Brain: No evidence of acute infarction, hemorrhage, hydrocephalus, extra-axial collection or mass lesion/mass effect. There is mild cerebral volume loss with associated ex vacuo dilatation. Vascular: There are vascular calcifications in the carotid siphons. Skull: Normal. Negative for fracture or focal lesion. Sinuses/Orbits: There is right maxillary sinus disease and left ethmoid sinus disease. There is a small left mastoid effusion. Other: None. IMPRESSION: 1. No acute intracranial  pathology. Electronically Signed   By: Romona Curls M.D.   On: 06/02/2023 10:03   DG Chest 2 View  Result Date: 06/02/2023 CLINICAL DATA:  Cough, fatigue. EXAM: CHEST - 2 VIEW COMPARISON:  Chest radiograph dated 07/14/2022. FINDINGS: The heart size and mediastinal contours are within normal limits. Vascular calcifications are seen in the aortic arch. Both lungs are clear. Degenerative changes are seen in the spine. IMPRESSION: No active cardiopulmonary disease. Electronically Signed   By: Romona Curls M.D.   On: 06/02/2023 09:58        Scheduled Meds:  ascorbic acid  500 mg Oral Daily   carbidopa-levodopa  1.5 tablet Oral TID   cyanocobalamin  1,000 mcg Subcutaneous Q24H   DULoxetine  30 mg Oral BID   enoxaparin (LOVENOX) injection  40 mg Subcutaneous Q24H   feeding supplement (NEPRO CARB STEADY)  237 mL Oral TID BM   fluticasone  2 spray Each Nare Daily   folic acid  1 mg Oral Daily   gabapentin  100 mg Oral TID   Ipratropium-Albuterol  1 puff Inhalation Q6H   loratadine  10 mg Oral Daily   multivitamin with minerals  1 tablet Oral Daily   nirmatrelvir/ritonavir  3 tablet Oral BID   pantoprazole  40 mg Oral Daily   sodium chloride flush  3 mL Intravenous Q12H   thiamine  100 mg Oral Daily   Vitamin D (Ergocalciferol)  50,000 Units Oral Q7 days   zinc sulfate  220 mg Oral Daily   Continuous Infusions:  lactated ringers 150 mL/hr at 06/03/23 1702     LOS: 0 days    Time spent: 35 minutes    Ramiro Harvest, MD [facility]   To contact the attending provider between 7A-7P or the covering provider during after hours 7P-7A, please log into the web site [email or web address] and access using universal [place] password for that web site. If you do not have the password, please call the hospital operator.  06/03/2023, 6:57 PM

## 2023-06-03 NOTE — [place] FL2 (Signed)
[place] MEDICAID FL2 LEVEL OF CARE FORM     IDENTIFICATION  Patient Name: Michele Brewer Birthdate: 1951-03-08 Sex: female Admission Date (Current Location): 06/02/2023  [place] and [name] Number:  Chiropodist and Address:  [facility], [address], [place], [place] [address]      Provider Number: [id number]  Attending Physician Name and Address:  Rodolph Bong, MD  Relative Name and Phone Number:  Mariella Saa (friend) [phone]    Current Level of Care: Hospital Recommended Level of Care: Skilled Nursing Facility Prior Approval Number:    Date Approved/Denied:   PASRR Number: [id number]  Discharge Plan: SNF    Current Diagnoses: Patient Active Problem List   Diagnosis Date Noted   COVID-19 virus infection 06/02/2023   Constipation 07/18/2022   Vitamin B12 deficiency 07/16/2022   Vitamin D deficiency 07/16/2022   Folate deficiency 07/16/2022   Right leg weakness 07/15/2022   Neuropathy 07/15/2022   Ambulatory dysfunction 07/14/2022   Hypomagnesemia 05/28/2022   Parkinson disease 05/25/2022   Depression 05/25/2022   Frequent falls 02/09/2021   Multiple fractures of ribs, left side, subsequent encounter for fracture with routine healing 02/08/2021   Polyneuropathy, unspecified 02/08/2021   Traumatic rhabdomyolysis (HCC) 02/04/2021   Fall 02/03/2021   Osteopenia 04/07/2020   Major depressive disorder, recurrent, in remission (HCC) 12/04/2019    Orientation RESPIRATION BLADDER Height & Weight     Self, Time, Situation, Place  Normal Incontinent, External catheter Weight: 141 lb 1.5 oz (64 kg) Height:  5\' 4"  (162.6 cm)  BEHAVIORAL SYMPTOMS/MOOD NEUROLOGICAL BOWEL NUTRITION STATUS      Continent Diet (see discharge summary)  AMBULATORY STATUS COMMUNICATION OF NEEDS Skin   Limited Assist Verbally Normal                       Personal Care Assistance Level of Assistance  Bathing, Feeding, Dressing, Total care  Bathing Assistance: Limited assistance Feeding assistance: Independent Dressing Assistance: Limited assistance Total Care Assistance: Limited assistance   Functional Limitations Info  Sight, Hearing, Speech Sight Info: Adequate (reading glasses) Hearing Info: Adequate Speech Info: Adequate    SPECIAL CARE FACTORS FREQUENCY  PT (By licensed PT), OT (By licensed OT)     PT Frequency: min 4x weekly OT Frequency: min 4x weekly            Contractures Contractures Info: Not present    Additional Factors Info  Code Status, Allergies Code Status Info: full Allergies Info: citrus, peanut containing drug products           Current Medications (06/03/2023):  This is the current hospital active medication list Current Facility-Administered Medications  Medication Dose Route Frequency Provider Last Rate Last Admin   acetaminophen (TYLENOL) tablet 650 mg  650 mg Oral Q6H PRN Verdene Lennert, MD       Or   acetaminophen (TYLENOL) suppository 650 mg  650 mg Rectal Q6H PRN Verdene Lennert, MD       ascorbic acid (VITAMIN C) tablet 500 mg  500 mg Oral Daily Rodolph Bong, MD   500 mg at 06/03/23 1004   benzonatate (TESSALON) capsule 200 mg  200 mg Oral TID PRN Verdene Lennert, MD   200 mg at 06/02/23 2305   carbidopa-levodopa (SINEMET IR) 25-100 MG per tablet immediate release 1.5 tablet  1.5 tablet Oral TID Verdene Lennert, MD   1.5 tablet at 06/03/23 1523   cyanocobalamin (VITAMIN B12) injection 1,000 mcg  1,000 mcg Subcutaneous  Q24H Rodolph Bong, MD       DULoxetine (CYMBALTA) DR capsule 30 mg  30 mg Oral BID Rodolph Bong, MD   30 mg at 06/03/23 1005   enoxaparin (LOVENOX) injection 40 mg  40 mg Subcutaneous Q24H Verdene Lennert, MD   40 mg at 06/03/23 1523   feeding supplement (NEPRO CARB STEADY) liquid 237 mL  237 mL Oral TID BM Rodolph Bong, MD   237 mL at 06/03/23 1005   fluticasone (FLONASE) 50 MCG/ACT nasal spray 2 spray  2 spray Each Nare Daily Rodolph Bong, MD   2 spray at 06/03/23 1006   folic acid (FOLVITE) tablet 1 mg  1 mg Oral Daily Verdene Lennert, MD   1 mg at 06/03/23 1610   gabapentin (NEURONTIN) capsule 100 mg  100 mg Oral TID Verdene Lennert, MD   100 mg at 06/03/23 1523   influenza vaccine adjuvanted (FLUAD) injection 0.5 mL  0.5 mL Intramuscular Prior to discharge Rodolph Bong, MD       Ipratropium-Albuterol (COMBIVENT) respimat 1 puff  1 puff Inhalation Q6H Rodolph Bong, MD   1 puff at 06/03/23 1009   lactated ringers infusion   Intravenous Continuous Rodolph Bong, MD 150 mL/hr at 06/03/23 1017 New Bag at 06/03/23 1017   loperamide (IMODIUM) capsule 2 mg  2 mg Oral PRN Verdene Lennert, MD       loratadine (CLARITIN) tablet 10 mg  10 mg Oral Daily Rodolph Bong, MD   10 mg at 06/03/23 1005   multivitamin with minerals tablet 1 tablet  1 tablet Oral Daily Verdene Lennert, MD   1 tablet at 06/03/23 0916   nirmatrelvir/ritonavir (PAXLOVID) 3 tablet  3 tablet Oral BID Rodolph Bong, MD   3 tablet at 06/03/23 1008   ondansetron (ZOFRAN) tablet 4 mg  4 mg Oral Q6H PRN Verdene Lennert, MD       Or   ondansetron (ZOFRAN) injection 4 mg  4 mg Intravenous Q6H PRN Verdene Lennert, MD       pantoprazole (PROTONIX) EC tablet 40 mg  40 mg Oral Daily Rodolph Bong, MD   40 mg at 06/03/23 1004   polyethylene glycol (MIRALAX / GLYCOLAX) packet 17 g  17 g Oral Daily PRN Verdene Lennert, MD       sodium chloride flush (NS) 0.9 % injection 3 mL  3 mL Intravenous Q12H Verdene Lennert, MD   3 mL at 06/03/23 1011   thiamine (VITAMIN B1) tablet 100 mg  100 mg Oral Daily Sharyn Creamer, MD   100 mg at 06/03/23 9604   Vitamin D (Ergocalciferol) (DRISDOL) 1.25 MG (50000 UNIT) capsule 50,000 Units  50,000 Units Oral Q7 days Rodolph Bong, MD   50,000 Units at 06/03/23 1005   zinc sulfate capsule 220 mg  220 mg Oral Daily Rodolph Bong, MD   220 mg at 06/03/23 1004     Discharge Medications: Please see discharge  summary for a list of discharge medications.  Relevant Imaging Results:  Relevant Lab Results:   Additional Information SSN:  [id number]  Darolyn Rua, LCSW

## 2023-06-03 NOTE — TOC Initial Note (Signed)
Transition of Care [facility]) - Initial/Assessment Note    Patient Details  Name: Michele Brewer MRN: [id number] Date of Birth: [birth year]  Transition of [facility]) CM/SW Contact:    Darolyn Rua, LCSW Phone Number: 06/03/2023, 3:54 PM  Clinical Narrative:                  CSW spoke with patient regarding SNF recommendations, she is in agreement for referrals to be sent out for bed offers. Reports she does not want to go to [facility] where she was at end of last year in October, however open to other snf options.   Referrals sent pending bed offers at this time.  Expected Discharge Plan: Skilled Nursing Facility Barriers to Discharge: Continued Medical Work up   Patient Goals and CMS Choice Patient states their goals for this hospitalization and ongoing recovery are:: to go home CMS Medicare.gov Compare Post Acute Care list provided to:: Patient Choice offered to / list presented to : Patient      Expected Discharge Plan and Services       Living arrangements for the past 2 months: Single Family Home                                      Prior Living Arrangements/Services Living arrangements for the past 2 months: Single Family Home Lives with:: Self                   Activities of Daily Living Home Assistive Devices/Equipment: Cane (specify quad or straight) ADL Screening (condition at time of admission) Patient's cognitive ability adequate to safely complete daily activities?: Yes Is the patient deaf or have difficulty hearing?: No Does the patient have difficulty seeing, even when wearing glasses/contacts?: No Does the patient have difficulty concentrating, remembering, or making decisions?: No Patient able to express need for assistance with ADLs?: Yes Does the patient have difficulty dressing or bathing?: No Independently performs ADLs?: Yes (appropriate for developmental age) Does the patient have difficulty walking or climbing stairs?:  Yes Weakness of Legs: Both Weakness of Arms/Hands: Both  Permission Sought/Granted                  Emotional Assessment       Orientation: : Oriented to Self, Oriented to Place, Oriented to  Time, Oriented to Situation Alcohol / Substance Use: Not Applicable Psych Involvement: No (comment)  Admission diagnosis:  Ambulatory dysfunction [R26.2] Patient Active Problem List   Diagnosis Date Noted   COVID-19 virus infection 06/02/2023   Constipation 07/18/2022   Vitamin B12 deficiency 07/16/2022   Vitamin D deficiency 07/16/2022   Folate deficiency 07/16/2022   Right leg weakness 07/15/2022   Neuropathy 07/15/2022   Ambulatory dysfunction 07/14/2022   Hypomagnesemia 05/28/2022   Parkinson disease 05/25/2022   Depression 05/25/2022   Frequent falls 02/09/2021   Multiple fractures of ribs, left side, subsequent encounter for fracture with routine healing 02/08/2021   Polyneuropathy, unspecified 02/08/2021   Traumatic rhabdomyolysis (HCC) 02/04/2021   Fall 02/03/2021   Osteopenia 04/07/2020   Major depressive disorder, recurrent, in remission (HCC) 12/04/2019   PCP:  Barbette Reichmann, MD Pharmacy:  No Pharmacies Listed    Social Determinants of Health (SDOH) Social History: SDOH Screenings   Food Insecurity: No Food Insecurity (06/02/2023)  Housing: Low Risk  (06/02/2023)  Transportation Needs: No Transportation Needs (06/02/2023)  Utilities: Not At Risk (06/02/2023)  Tobacco Use: Low Risk  (06/02/2023)   SDOH Interventions:     Readmission Risk Interventions     No data to display

## 2023-06-04 DIAGNOSIS — E538 Deficiency of other specified B group vitamins: Secondary | ICD-10-CM | POA: Diagnosis not present

## 2023-06-04 DIAGNOSIS — R262 Difficulty in walking, not elsewhere classified: Secondary | ICD-10-CM | POA: Diagnosis not present

## 2023-06-04 DIAGNOSIS — U071 COVID-19: Secondary | ICD-10-CM | POA: Diagnosis not present

## 2023-06-04 DIAGNOSIS — G20B2 Parkinson's disease with dyskinesia, with fluctuations: Secondary | ICD-10-CM | POA: Diagnosis not present

## 2023-06-04 LAB — CBC
HCT: 29.7 % — ABNORMAL LOW (ref 36.0–46.0)
Hemoglobin: 10.7 g/dL — ABNORMAL LOW (ref 12.0–15.0)
MCH: 34.3 pg — ABNORMAL HIGH (ref 26.0–34.0)
MCHC: 36 g/dL (ref 30.0–36.0)
MCV: 95.2 fL (ref 80.0–100.0)
Platelets: 143 10*3/uL — ABNORMAL LOW (ref 150–400)
RBC: 3.12 MIL/uL — ABNORMAL LOW (ref 3.87–5.11)
RDW: 13.3 % (ref 11.5–15.5)
WBC: 2.8 10*3/uL — ABNORMAL LOW (ref 4.0–10.5)
nRBC: 0 % (ref 0.0–0.2)

## 2023-06-04 LAB — COMPREHENSIVE METABOLIC PANEL
ALT: 7 U/L (ref 0–44)
AST: 52 U/L — ABNORMAL HIGH (ref 15–41)
Albumin: 2.9 g/dL — ABNORMAL LOW (ref 3.5–5.0)
Alkaline Phosphatase: 67 U/L (ref 38–126)
Anion gap: 7 (ref 5–15)
BUN: 15 mg/dL (ref 8–23)
CO2: 27 mmol/L (ref 22–32)
Calcium: 8.5 mg/dL — ABNORMAL LOW (ref 8.9–10.3)
Chloride: 104 mmol/L (ref 98–111)
Creatinine, Ser: 0.61 mg/dL (ref 0.44–1.00)
GFR, Estimated: >60 mL/min (ref >60)
Glucose, Bld: 93 mg/dL (ref 70–99)
Potassium: 3.4 mmol/L — ABNORMAL LOW (ref 3.5–5.1)
Sodium: 138 mmol/L (ref 135–145)
Total Bilirubin: 0.6 mg/dL (ref 0.3–1.2)
Total Protein: 5.7 g/dL — ABNORMAL LOW (ref 6.5–8.1)

## 2023-06-04 LAB — GLUCOSE, CAPILLARY: Glucose-Capillary: 68 mg/dL — ABNORMAL LOW (ref 70–99)

## 2023-06-04 LAB — CK: Total CK: 978 U/L — ABNORMAL HIGH (ref 38–234)

## 2023-06-04 LAB — C-REACTIVE PROTEIN: CRP: 0.6 mg/dL (ref <1.0)

## 2023-06-04 MED ORDER — POTASSIUM CHLORIDE CRYS ER 10 MEQ PO TBCR
40.0000 meq | EXTENDED_RELEASE_TABLET | Freq: Once | ORAL | Status: AC
  Administered 2023-06-04: 40 meq via ORAL
  Filled 2023-06-04: qty 4

## 2023-06-04 NOTE — Plan of Care (Signed)

## 2023-06-04 NOTE — Progress Notes (Signed)
PROGRESS NOTE    Michele Brewer  NWG:[id number] DOB: January 09, 1951 DOA: 06/02/2023 PCP: Barbette Reichmann, MD    Chief Complaint  Patient presents with   Fall    Brief Narrative:  Patient 72 year old female with history of PD, osteopenia, frequent falls, malnutrition with multiple vitamin deficiencies, presented to ED with multiple falls. Patient noted to have recently tested positive for COVID. Patient noted to have elevated CK, CT head negative.   Assessment & Plan:   Principal Problem:   Ambulatory dysfunction Active Problems:   COVID-19 virus infection   Parkinson disease   Vitamin B12 deficiency   Elevated CK  #1 ambulatory dysfunction/falls -Patient with history of ambulatory dysfunction and frequent falls in the setting of Parkinson disease with vertigo. -Patient noted to have worsening lower extremity dysfunction and frequent falls in the setting of COVID-19 infection. -CK elevated however trending down with hydration. -Decrease IV fluid rate to 125 cc an hour. -Patient assessed by PT/OT who are recommending SNF placement..  2.  COVID-19 virus infection -Patient presented with 2-day history of worsening fatigue, cough, congestion, COVID-19 PCR positive. -Patient with no significant shortness of breath.  No hypoxia. -Continue Paxlovid, vitamin C, zinc, Flonase, Claritin, PPI, scheduled Combivent.    3.  Vitamin B12 deficiency -Vitamin B12 level of 168. -Continue Vitamin B12 1000 mcg subcu daily x 6 days, then weekly x 1 month, then monthly. -Outpatient follow-up with PCP  4.  Elevated CK levels -CK of 2060 >>>> 978 -Decrease IV fluid rate to 125 cc an hour.   -Repeat labs in AM.  5.  Parkinson's disease -Sinemet.   -PT/OT.    DVT prophylaxis: Lovenox Code Status: Full Family Communication: Updated patient.  No family at bedside. Disposition: SNF  Status is: Inpatient The patient will require care spanning > 2 midnights and should be moved to inpatient  because: Severity of illness   Consultants:  None  Procedures:  CT head 06/02/2023 Chest x-ray 06/02/2023  Antimicrobials: Anti-infectives (From admission, onward)    Start     Dose/Rate Route Frequency Ordered Stop   06/03/23 1015  nirmatrelvir/ritonavir (PAXLOVID) 3 tablet        3 tablet Oral 2 times daily 06/03/23 0915 06/08/23 0959         Subjective: Sitting up in chair breakfast in front of her.  Denies any chest pain or worsening shortness of breath.  Stated yesterday she felt weakness was improving however feels a little weak today.  Tolerating current diet.   Objective: Vitals:   06/03/23 1858 06/03/23 1930 06/04/23 0332 06/04/23 0700  BP: 134/67 139/65 (!) 140/70 (!) 150/66  Pulse: (!) 55 (!) 56 (!) 58 (!) 49  Resp: 16 18 20 18   Temp: 98.3 F (36.8 C) 97.8 F (36.6 C) 98.3 F (36.8 C) 98.3 F (36.8 C)  TempSrc: Oral Oral Oral Oral  SpO2: 99% 100%  97%  Weight:      Height:        Intake/Output Summary (Last 24 hours) at 06/04/2023 1208 Last data filed at 06/04/2023 0400 Gross per 24 hour  Intake 2038.19 ml  Output --  Net 2038.19 ml   Filed Weights   06/02/23 0651  Weight: 64 kg    Examination:  General exam: NAD Respiratory system: CTAB.  No wheezes, no crackles, no rhonchi.  Fair air movement.  Speaking in full sentences.  Cardiovascular system: Regular rate rhythm no murmurs rubs or gallops.  No JVD.  No lower extremity edema.  Gastrointestinal system:  Abdomen is soft, nontender, nondistended, positive bowel sounds.  No rebound.  No guarding.  Central nervous system: Alert and oriented. No focal neurological deficits. Extremities: Symmetric 5 x 5 power. Skin: No rashes, lesions or ulcers Psychiatry: Judgement and insight appear normal. Mood & affect appropriate.     Data Reviewed: I have personally reviewed following labs and imaging studies  CBC: Recent Labs  Lab 06/02/23 1040 06/04/23 0442  WBC 3.2* 2.8*  NEUTROABS 2.0  --   HGB  11.3* 10.7*  HCT 33.3* 29.7*  MCV 92.0 95.2  PLT 162 143*    Basic Metabolic Panel: Recent Labs  Lab 06/02/23 1040 06/03/23 0554 06/04/23 0442  NA 137 137 138  K 3.3* 3.7 3.4*  CL 100 103 104  CO2 26 25 27   GLUCOSE 84 78 93  BUN 21 20 15   CREATININE 0.72 0.60 0.61  CALCIUM 9.1 8.5* 8.5*  MG  --  2.0  --     GFR: Estimated Creatinine Clearance: 54.1 mL/min (by C-G formula based on SCr of 0.61 mg/dL).  Liver Function Tests: Recent Labs  Lab 06/02/23 1040 06/04/23 0442  AST 62* 52*  ALT 24 7  ALKPHOS 87 67  BILITOT 1.2 0.6  PROT 7.1 5.7*  ALBUMIN 3.6 2.9*    CBG: Recent Labs  Lab 06/04/23 0738  GLUCAP 68*     Recent Results (from the past 240 hour(s))  Resp Panel by RT-PCR (Flu A&B, Covid) Anterior Nasal Swab     Status: Abnormal   Collection Time: 06/02/23 10:40 AM   Specimen: Anterior Nasal Swab  Result Value Ref Range Status   SARS Coronavirus 2 by RT PCR POSITIVE (A) NEGATIVE Final    Comment: (NOTE) SARS-CoV-2 target nucleic acids are DETECTED.  The SARS-CoV-2 RNA is generally detectable in upper respiratory specimens during the acute phase of infection. Positive results are indicative of the presence of the identified virus, but do not rule out bacterial infection or co-infection with other pathogens not detected by the test. Clinical correlation with patient history and other diagnostic information is necessary to determine patient infection status. The expected result is Negative.  Fact Sheet for Patients: BloggerCourse.com  Fact Sheet for Healthcare Providers: SeriousBroker.it  This test is not yet approved or cleared by the [place] FDA and  has been authorized for detection and/or diagnosis of SARS-CoV-2 by FDA under an Emergency Use Authorization (EUA).  This EUA will remain in effect (meaning this test can be used) for the duration of  the COVID-19 declaration under Section  564(b)(1) of the A ct, 21 U.S.C. section 360bbb-3(b)(1), unless the authorization is terminated or revoked sooner.     Influenza A by PCR NEGATIVE NEGATIVE Final   Influenza B by PCR NEGATIVE NEGATIVE Final    Comment: (NOTE) The Xpert Xpress SARS-CoV-2/FLU/RSV plus assay is intended as an aid in the diagnosis of influenza from Nasopharyngeal swab specimens and should not be used as a sole basis for treatment. Nasal washings and aspirates are unacceptable for Xpert Xpress SARS-CoV-2/FLU/RSV testing.  Fact Sheet for Patients: BloggerCourse.com  Fact Sheet for Healthcare Providers: SeriousBroker.it  This test is not yet approved or cleared by the [place] FDA and has been authorized for detection and/or diagnosis of SARS-CoV-2 by FDA under an Emergency Use Authorization (EUA). This EUA will remain in effect (meaning this test can be used) for the duration of the COVID-19 declaration under Section 564(b)(1) of the Act, 21 U.S.C. section 360bbb-3(b)(1), unless the authorization is terminated  or revoked.  Performed at [facility], [address] [place], [place] [address]          Radiology Studies: No results found.      Scheduled Meds:  ascorbic acid  500 mg Oral Daily   carbidopa-levodopa  1.5 tablet Oral TID   cyanocobalamin  1,000 mcg Subcutaneous Q24H   DULoxetine  30 mg Oral BID   enoxaparin (LOVENOX) injection  40 mg Subcutaneous Q24H   feeding supplement (NEPRO CARB STEADY)  237 mL Oral TID BM   fluticasone  2 spray Each Nare Daily   folic acid  1 mg Oral Daily   gabapentin  100 mg Oral TID   Ipratropium-Albuterol  1 puff Inhalation Q6H   loratadine  10 mg Oral Daily   multivitamin with minerals  1 tablet Oral Daily   nirmatrelvir/ritonavir  3 tablet Oral BID   pantoprazole  40 mg Oral Daily   sodium chloride flush  3 mL Intravenous Q12H   thiamine  100 mg Oral Daily   Vitamin D  (Ergocalciferol)  50,000 Units Oral Q7 days   zinc sulfate  220 mg Oral Daily   Continuous Infusions:  lactated ringers 125 mL/hr at 06/04/23 1131     LOS: 1 day    Time spent: 35 minutes    Ramiro Harvest, MD [facility]   To contact the attending provider between 7A-7P or the covering provider during after hours 7P-7A, please log into the web site [email or web address] and access using universal [place] password for that web site. If you do not have the password, please call the hospital operator.  06/04/2023, 12:08 PM

## 2023-06-04 NOTE — TOC Progression Note (Signed)
Transition of Care [facility]) - Progression Note    Patient Details  Name: Michele Brewer MRN: [id number] Date of Birth: 03-Mar-1951  Transition of Care [facility] CM/SW Contact  Chapman Fitch, RN Phone Number: 06/04/2023, 2:00 PM  Clinical Narrative:     Bed offers presented. Patient accepts bed at [facility] Accepted in Hub and notified [name].  Per [name] they do not have a private room at this time, and patient would be able to admit 9/25. If a private room opens up before then they could admit earlier pending auth  Expected Discharge Plan: Skilled Nursing Facility Barriers to Discharge: Continued Medical Work up  Expected Discharge Plan and Services       Living arrangements for the past 2 months: Single Family Home                                       Social Determinants of Health (SDOH) Interventions SDOH Screenings   Food Insecurity: No Food Insecurity (06/02/2023)  Housing: Low Risk  (06/02/2023)  Transportation Needs: No Transportation Needs (06/02/2023)  Utilities: Not At Risk (06/02/2023)  Tobacco Use: Low Risk  (06/02/2023)    Readmission Risk Interventions     No data to display

## 2023-06-04 NOTE — Progress Notes (Signed)
Physical Therapy Treatment Patient Details Name: Michele Brewer MRN: [id number] DOB: 1951/01/10 Today's Date: 06/04/2023   History of Present Illness Pt is a 72 year old female presenting to the ED with multiple falls, COVID 19+    PMH significant for Parkinson's disease, osteopenia, frequent falls, malnutrition with multiple vitamin deficiencies    PT Comments  Good functional improvement noted this session. Pt tolerated 1107ft of gait training with support of RW and CGA. No c/o SOB while on RA, however did feel fatigued upon completion. Will continue to progress mobility and strength in order to facilitate pt's return to previous independent function. Pt currently on mobility specialist list. Continue PT per POC.   If plan is discharge home, recommend the following: A little help with walking and/or transfers;Assistance with cooking/housework;Help with stairs or ramp for entrance   Can travel by private vehicle     No  Equipment Recommendations  Other (comment) (TBD at next level of care)    Recommendations for Other Services       Precautions / Restrictions Precautions Precautions: Fall Precaution Comments: COVID Restrictions Weight Bearing Restrictions: No     Mobility  Bed Mobility               General bed mobility comments: Pt in recliner pre/post session    Transfers Overall transfer level: Needs assistance Equipment used: Rolling walker (2 wheels) Transfers: Sit to/from Stand Sit to Stand: Min assist           General transfer comment: VC's for safe hand placement and sequencing RW with steps to recliner.    Ambulation/Gait Ambulation/Gait assistance: Contact guard assist Gait Distance (Feet): 120 Feet Assistive device: Rolling walker (2 wheels) Gait Pattern/deviations: Step-through pattern, Narrow base of support, Drifts right/left Gait velocity: decreased     General Gait Details: No SOB while ambulating in hall on RA   Stairs              Wheelchair Mobility     Tilt Bed    Modified Rankin (Stroke Patients Only)       Balance Overall balance assessment: Needs assistance Sitting-balance support: Bilateral upper extremity supported, Feet supported Sitting balance-Leahy Scale: Fair     Standing balance support: Bilateral upper extremity supported, During functional activity, Reliant on assistive device for balance Standing balance-Leahy Scale: Fair Standing balance comment:  (No lean noted, improved from previous sessions)                            Cognition Arousal: Alert Behavior During Therapy: WFL for tasks assessed/performed Overall Cognitive Status: Within Functional Limits for tasks assessed                                 General Comments: Very pleasant and cooperative        Exercises General Exercises - Lower Extremity Ankle Circles/Pumps: AROM, Both, 10 reps, Seated Long Arc Quad: AROM, Both, 10 reps, Seated Other Exercises Other Exercises: edu re: role of PT, role of rehab, discharge recommendations, safe use of RW    General Comments General comments (skin integrity, edema, etc.): Slight edema noted in B Lower legs/feet, pt positioned in recliner with feet elevated      Pertinent Vitals/Pain Pain Assessment Pain Assessment: 0-10 Pain Score: 3  Pain Location: Right foot Pain Descriptors / Indicators: Discomfort, Pressure, Squeezing Pain Intervention(s): Monitored during session  Home Living                          Prior Function            PT Goals (current goals can now be found in the care plan section) Acute Rehab PT Goals Patient Stated Goal: to return home with mom and sister Progress towards PT goals: Progressing toward goals    Frequency    Min 1X/week      PT Plan      Co-evaluation              AM-PAC PT "6 Clicks" Mobility   Outcome Measure  Help needed turning from your back to your side while in a flat  bed without using bedrails?: A Lot Help needed moving from lying on your back to sitting on the side of a flat bed without using bedrails?: A Lot Help needed moving to and from a bed to a chair (including a wheelchair)?: A Little Help needed standing up from a chair using your arms (e.g., wheelchair or bedside chair)?: A Little Help needed to walk in hospital room?: A Little Help needed climbing 3-5 steps with a railing? : A Lot 6 Click Score: 15    End of Session Equipment Utilized During Treatment: Gait belt Activity Tolerance: Patient tolerated treatment well Patient left: in chair;with call bell/phone within reach;with chair alarm set Nurse Communication: Mobility status PT Visit Diagnosis: Unsteadiness on feet (R26.81);Other abnormalities of gait and mobility (R26.89);Muscle weakness (generalized) (M62.81);History of falling (Z91.81);Difficulty in walking, not elsewhere classified (R26.2);Other symptoms and signs involving the nervous system (R29.898)     Time: 4540-9811 PT Time Calculation (min) (ACUTE ONLY): 31 min  Charges:    $Gait Training: 8-22 mins $Therapeutic Exercise: 8-22 mins PT General Charges $$ ACUTE PT VISIT: 1 Visit           Zadie Cleverly, PTA  Jannet Askew 06/04/2023, 5:40 PM

## 2023-06-05 DIAGNOSIS — R748 Abnormal levels of other serum enzymes: Secondary | ICD-10-CM | POA: Diagnosis not present

## 2023-06-05 DIAGNOSIS — U071 COVID-19: Secondary | ICD-10-CM | POA: Diagnosis not present

## 2023-06-05 DIAGNOSIS — E538 Deficiency of other specified B group vitamins: Secondary | ICD-10-CM | POA: Diagnosis not present

## 2023-06-05 DIAGNOSIS — R262 Difficulty in walking, not elsewhere classified: Secondary | ICD-10-CM | POA: Diagnosis not present

## 2023-06-05 LAB — CBC
HCT: 33.4 % — ABNORMAL LOW (ref 36.0–46.0)
Hemoglobin: 11.4 g/dL — ABNORMAL LOW (ref 12.0–15.0)
MCH: 30.8 pg (ref 26.0–34.0)
MCHC: 34.1 g/dL (ref 30.0–36.0)
MCV: 90.3 fL (ref 80.0–100.0)
Platelets: 151 10*3/uL (ref 150–400)
RBC: 3.7 MIL/uL — ABNORMAL LOW (ref 3.87–5.11)
RDW: 13.4 % (ref 11.5–15.5)
WBC: 3.5 10*3/uL — ABNORMAL LOW (ref 4.0–10.5)
nRBC: 0 % (ref 0.0–0.2)

## 2023-06-05 LAB — COMPREHENSIVE METABOLIC PANEL
ALT: 12 U/L (ref 0–44)
AST: 41 U/L (ref 15–41)
Albumin: 2.9 g/dL — ABNORMAL LOW (ref 3.5–5.0)
Alkaline Phosphatase: 73 U/L (ref 38–126)
Anion gap: 7 (ref 5–15)
BUN: 10 mg/dL (ref 8–23)
CO2: 29 mmol/L (ref 22–32)
Calcium: 8.5 mg/dL — ABNORMAL LOW (ref 8.9–10.3)
Chloride: 104 mmol/L (ref 98–111)
Creatinine, Ser: 0.67 mg/dL (ref 0.44–1.00)
GFR, Estimated: >60 mL/min (ref >60)
Glucose, Bld: 85 mg/dL (ref 70–99)
Potassium: 3.9 mmol/L (ref 3.5–5.1)
Sodium: 140 mmol/L (ref 135–145)
Total Bilirubin: 0.4 mg/dL (ref 0.3–1.2)
Total Protein: 5.9 g/dL — ABNORMAL LOW (ref 6.5–8.1)

## 2023-06-05 LAB — MAGNESIUM: Magnesium: 1.8 mg/dL (ref 1.7–2.4)

## 2023-06-05 LAB — CK: Total CK: 557 U/L — ABNORMAL HIGH (ref 38–234)

## 2023-06-05 LAB — C-REACTIVE PROTEIN: CRP: 0.6 mg/dL (ref <1.0)

## 2023-06-05 NOTE — Progress Notes (Signed)
PROGRESS NOTE    Michele Brewer  NWG:[id number] DOB: [birth year] DOA: 06/02/2023 PCP: Barbette Reichmann, MD    Chief Complaint  Patient presents with   Fall    Brief Narrative:  Patient 72 year old female with history of PD, osteopenia, frequent falls, malnutrition with multiple vitamin deficiencies, presented to ED with multiple falls. Patient noted to have recently tested positive for COVID. Patient noted to have elevated CK, CT head negative.   Assessment & Plan:   Principal Problem:   Ambulatory dysfunction Active Problems:   COVID-19 virus infection   Parkinson disease   Vitamin B12 deficiency   Elevated CK  #1 ambulatory dysfunction/falls -Patient with history of ambulatory dysfunction and frequent falls in the setting of Parkinson disease with vertigo. -Patient noted to have worsening lower extremity dysfunction and frequent falls in the setting of COVID-19 infection. -CK elevated and trending down with hydration.   -Saline lock IV fluids.   -Patient seen in consultation by PT OT who are recommending SNF placement.   2.  COVID-19 virus infection -Patient presented with 2-day history of worsening fatigue, cough, congestion, COVID-19 PCR positive. -Patient with no significant shortness of breath.  No hypoxia. -Continue Paxlovid, zinc, vitamin C, Flonase, Claritin, PPI, scheduled Combivent.   3.  Vitamin B12 deficiency -Vitamin B12 level of 168. -Continue Vitamin B12 1000 mcg subcu daily x 5 days, then weekly x 1 month, then monthly. -Outpatient follow-up with PCP  4.  Elevated CK levels -CK of 2060 >>>> 978>>> 557 -Saline lock IV fluids.  5.  Parkinson's disease -Sinemet.   -PT/OT.  6.  Hypokalemia -Repleted.   DVT prophylaxis: Lovenox Code Status: Full Family Communication: Updated patient.  No family at bedside. Disposition: SNF  Status is: Inpatient The patient will require care spanning > 2 midnights and should be moved to inpatient because:  Severity of illness   Consultants:  None  Procedures:  CT head 06/02/2023 Chest x-ray 06/02/2023  Antimicrobials: Anti-infectives (From admission, onward)    Start     Dose/Rate Route Frequency Ordered Stop   06/03/23 1015  nirmatrelvir/ritonavir (PAXLOVID) 3 tablet        3 tablet Oral 2 times daily 06/03/23 0915 06/08/23 0959         Subjective: Sitting up in chair.  Denies any chest pain or shortness of breath.  Denies any abdominal pain.  Tolerating oral intake.  Asking about SNF placement and asking to speak with the transitions of care team.   Objective: Vitals:   06/04/23 1626 06/04/23 1958 06/05/23 0403 06/05/23 0745  BP: 132/67 (!) 149/71 (!) 140/67 (!) 154/70  Pulse: 65 (!) 55 (!) 54 (!) 55  Resp: 18 18 18 18   Temp: 97.9 F (36.6 C) 98.2 F (36.8 C) 98.3 F (36.8 C) 98.4 F (36.9 C)  TempSrc:    Oral  SpO2: 100% 100% 96% 96%  Weight:      Height:        Intake/Output Summary (Last 24 hours) at 06/05/2023 1229 Last data filed at 06/05/2023 0748 Gross per 24 hour  Intake 1508.44 ml  Output --  Net 1508.44 ml   Filed Weights   06/02/23 0651  Weight: 64 kg    Examination:  General exam: NAD.  Pill-rolling tremor noted Respiratory system: Lungs clear to auscultation bilaterally.  No wheezes, no crackles, no rhonchi.  Fair air movement.  Speaking in full sentences.   Cardiovascular system: RRR no murmurs rubs or gallops.  No JVD.  No lower extremity edema.  Gastrointestinal system: Abdomen is soft, nontender, nondistended, positive bowel sounds.  No rebound.  No guarding.  Central nervous system: Alert and oriented. No focal neurological deficits. Extremities: Symmetric 5 x 5 power. Skin: No rashes, lesions or ulcers Psychiatry: Judgement and insight appear normal. Mood & affect appropriate.     Data Reviewed: I have personally reviewed following labs and imaging studies  CBC: Recent Labs  Lab 06/02/23 1040 06/04/23 0442 06/05/23 0723  WBC 3.2*  2.8* 3.5*  NEUTROABS 2.0  --   --   HGB 11.3* 10.7* 11.4*  HCT 33.3* 29.7* 33.4*  MCV 92.0 95.2 90.3  PLT 162 143* 151    Basic Metabolic Panel: Recent Labs  Lab 06/02/23 1040 06/03/23 0554 06/04/23 0442 06/05/23 0723  NA 137 137 138 140  K 3.3* 3.7 3.4* 3.9  CL 100 103 104 104  CO2 26 25 27 29   GLUCOSE 84 78 93 85  BUN 21 20 15 10   CREATININE 0.72 0.60 0.61 0.67  CALCIUM 9.1 8.5* 8.5* 8.5*  MG  --  2.0  --  1.8    GFR: Estimated Creatinine Clearance: 54.1 mL/min (by C-G formula based on SCr of 0.67 mg/dL).  Liver Function Tests: Recent Labs  Lab 06/02/23 1040 06/04/23 0442 06/05/23 0723  AST 62* 52* 41  ALT 24 7 12   ALKPHOS 87 67 73  BILITOT 1.2 0.6 0.4  PROT 7.1 5.7* 5.9*  ALBUMIN 3.6 2.9* 2.9*    CBG: Recent Labs  Lab 06/04/23 0738  GLUCAP 68*     Recent Results (from the past 240 hour(s))  Resp Panel by RT-PCR (Flu A&B, Covid) Anterior Nasal Swab     Status: Abnormal   Collection Time: 06/02/23 10:40 AM   Specimen: Anterior Nasal Swab  Result Value Ref Range Status   SARS Coronavirus 2 by RT PCR POSITIVE (A) NEGATIVE Final    Comment: (NOTE) SARS-CoV-2 target nucleic acids are DETECTED.  The SARS-CoV-2 RNA is generally detectable in upper respiratory specimens during the acute phase of infection. Positive results are indicative of the presence of the identified virus, but do not rule out bacterial infection or co-infection with other pathogens not detected by the test. Clinical correlation with patient history and other diagnostic information is necessary to determine patient infection status. The expected result is Negative.  Fact Sheet for Patients: BloggerCourse.com  Fact Sheet for Healthcare Providers: SeriousBroker.it  This test is not yet approved or cleared by the [place] FDA and  has been authorized for detection and/or diagnosis of SARS-CoV-2 by FDA under an Emergency Use  Authorization (EUA).  This EUA will remain in effect (meaning this test can be used) for the duration of  the COVID-19 declaration under Section 564(b)(1) of the A ct, 21 U.S.C. section 360bbb-3(b)(1), unless the authorization is terminated or revoked sooner.     Influenza A by PCR NEGATIVE NEGATIVE Final   Influenza B by PCR NEGATIVE NEGATIVE Final    Comment: (NOTE) The Xpert Xpress SARS-CoV-2/FLU/RSV plus assay is intended as an aid in the diagnosis of influenza from Nasopharyngeal swab specimens and should not be used as a sole basis for treatment. Nasal washings and aspirates are unacceptable for Xpert Xpress SARS-CoV-2/FLU/RSV testing.  Fact Sheet for Patients: BloggerCourse.com  Fact Sheet for Healthcare Providers: SeriousBroker.it  This test is not yet approved or cleared by the [place] FDA and has been authorized for detection and/or diagnosis of SARS-CoV-2 by FDA under an Emergency Use Authorization (EUA). This EUA will  remain in effect (meaning this test can be used) for the duration of the COVID-19 declaration under Section 564(b)(1) of the Act, 21 U.S.C. section 360bbb-3(b)(1), unless the authorization is terminated or revoked.  Performed at [facility], [address], [place], [place] [address]          Radiology Studies: No results found.      Scheduled Meds:  ascorbic acid  500 mg Oral Daily   carbidopa-levodopa  1.5 tablet Oral TID   cyanocobalamin  1,000 mcg Subcutaneous Q24H   DULoxetine  30 mg Oral BID   enoxaparin (LOVENOX) injection  40 mg Subcutaneous Q24H   feeding supplement (NEPRO CARB STEADY)  237 mL Oral TID BM   fluticasone  2 spray Each Nare Daily   folic acid  1 mg Oral Daily   gabapentin  100 mg Oral TID   Ipratropium-Albuterol  1 puff Inhalation Q6H   loratadine  10 mg Oral Daily   multivitamin with minerals  1 tablet Oral Daily   nirmatrelvir/ritonavir  3  tablet Oral BID   pantoprazole  40 mg Oral Daily   sodium chloride flush  3 mL Intravenous Q12H   thiamine  100 mg Oral Daily   Vitamin D (Ergocalciferol)  50,000 Units Oral Q7 days   zinc sulfate  220 mg Oral Daily   Continuous Infusions:     LOS: 2 days    Time spent: 35 minutes    Ramiro Harvest, MD [facility]   To contact the attending provider between 7A-7P or the covering provider during after hours 7P-7A, please log into the web site [email or web address] and access using universal [place] password for that web site. If you do not have the password, please call the hospital operator.  06/05/2023, 12:29 PM

## 2023-06-05 NOTE — Progress Notes (Signed)
Occupational Therapy Treatment Patient Details Name: Michele Brewer MRN: [id number] DOB: Jun 25, 1951 Today's Date: 06/05/2023   History of present illness Pt is a 72 year old female presenting to the ED with multiple falls, COVID 19+    PMH significant for Parkinson's disease, osteopenia, frequent falls, malnutrition with multiple vitamin deficiencies   OT comments  Michele Brewer was seen for OT treatment on this date. Upon arrival to room pt seated EOB, agreeable to tx. Pt requires SUPERVISION don/doff B shoes in sitting. SBA + RW for functional mobility >200 ft and funcitonal reaching task no LOB noted reaching outside BOS. Pt making good progress toward goals, will continue to follow POC. Discharge recommendation updated to reflect pt progress.       If plan is discharge home, recommend the following:  A little help with walking and/or transfers;A lot of help with bathing/dressing/bathroom;Assistance with cooking/housework;Help with stairs or ramp for entrance;Assist for transportation   Equipment Recommendations  None recommended by OT;Other (comment)    Recommendations for Other Services      Precautions / Restrictions Precautions Precautions: Fall Restrictions Weight Bearing Restrictions: No       Mobility Bed Mobility               General bed mobility comments: not tested    Transfers Overall transfer level: Needs assistance Equipment used: None Transfers: Sit to/from Stand Sit to Stand: Supervision                 Balance Overall balance assessment: Needs assistance Sitting-balance support: No upper extremity supported Sitting balance-Leahy Scale: Good     Standing balance support: During functional activity, No upper extremity supported Standing balance-Leahy Scale: Fair                             ADL either performed or assessed with clinical judgement   ADL Overall ADL's : Needs assistance/impaired                                        General ADL Comments: SUPERVISION don/doff B shoes in sitting. SBA + RW for functional mobility >200 ft and funcitonal reaching task no LOB noted reaching outside BOS.      Cognition Arousal: Alert Behavior During Therapy: WFL for tasks assessed/performed Overall Cognitive Status: Within Functional Limits for tasks assessed                                                     Pertinent Vitals/ Pain       Pain Assessment Pain Assessment: No/denies pain   Frequency  Min 1X/week        Progress Toward Goals  OT Goals(current goals can now be found in the care plan section)  Progress towards OT goals: Progressing toward goals  Acute Rehab OT Goals Patient Stated Goal: to go home OT Goal Formulation: With patient Time For Goal Achievement: 06/17/23 Potential to Achieve Goals: Fair ADL Goals Pt Will Perform Grooming: with modified independence;sitting Pt Will Perform Lower Body Dressing: with modified independence;sitting/lateral leans Pt Will Transfer to Toilet: with modified independence;ambulating Pt Will Perform Toileting - Clothing Manipulation and hygiene: with modified independence;sit to/from stand  Plan  Co-evaluation                 AM-PAC OT "6 Clicks" Daily Activity     Outcome Measure   Help from another person eating meals?: None Help from another person taking care of personal grooming?: None Help from another person toileting, which includes using toliet, bedpan, or urinal?: A Little Help from another person bathing (including washing, rinsing, drying)?: A Little Help from another person to put on and taking off regular upper body clothing?: None Help from another person to put on and taking off regular lower body clothing?: None 6 Click Score: 22    End of Session Equipment Utilized During Treatment: Rolling walker (2 wheels)  OT Visit Diagnosis: Other abnormalities of gait and mobility  (R26.89);Muscle weakness (generalized) (M62.81)   Activity Tolerance Patient tolerated treatment well   Patient Left in chair;with call bell/phone within reach   Nurse Communication          Time: 1610-9604 OT Time Calculation (min): 20 min  Charges: OT General Charges $OT Visit: 1 Visit OT Treatments $Therapeutic Activity: 8-22 mins  Kathie Dike, M.S. OTR/L  06/05/23, 1:06 PM  ascom [phone]

## 2023-06-05 NOTE — TOC Progression Note (Addendum)
Transition of Care [facility]) - Progression Note    Patient Details  Name: Michele Brewer MRN: [id number] Date of Birth: Oct 20, 1950  Transition of Care [facility]) CM/SW Contact  Darolyn Rua, [place] Phone Number: 06/05/2023, 9:45 AM  Clinical Narrative:     Update 2:29 pm [name] reports bed available, insurance Berkley Harvey has been started with [facility] pending auth   9/17: CSW reached out to [place] with [facility] to inquire if any private rooms have opened up, pending response at this time.   9/16: Per [name] they do not have a private room at this time, and patient would be able to admit 9/25. If a private room opens up before then they could admit earlier pending auth    Expected Discharge Plan: Skilled Nursing Facility Barriers to Discharge: Continued Medical Work up  Expected Discharge Plan and Services       Living arrangements for the past 2 months: Single Family Home                                       Social Determinants of Health (SDOH) Interventions SDOH Screenings   Food Insecurity: No Food Insecurity (06/02/2023)  Housing: Low Risk  (06/02/2023)  Transportation Needs: No Transportation Needs (06/02/2023)  Utilities: Not At Risk (06/02/2023)  Tobacco Use: Low Risk  (06/02/2023)    Readmission Risk Interventions     No data to display

## 2023-06-06 DIAGNOSIS — R262 Difficulty in walking, not elsewhere classified: Secondary | ICD-10-CM | POA: Diagnosis not present

## 2023-06-06 LAB — URINALYSIS, ROUTINE W REFLEX MICROSCOPIC
Bilirubin Urine: NEGATIVE
Glucose, UA: NEGATIVE mg/dL
Ketones, ur: 5 mg/dL — AB
Nitrite: NEGATIVE
Protein, ur: 30 mg/dL — AB
Specific Gravity, Urine: 1.013 (ref 1.005–1.030)
WBC, UA: >50 WBC/hpf (ref 0–5)
pH: 8 (ref 5.0–8.0)

## 2023-06-06 MED ORDER — SODIUM CHLORIDE 0.9 % IV SOLN
1.0000 g | INTRAVENOUS | Status: DC
  Administered 2023-06-06 – 2023-06-08 (×3): 1 g via INTRAVENOUS
  Filled 2023-06-06 (×4): qty 10

## 2023-06-06 NOTE — Care Management Important Message (Signed)
Important Message  Patient Details  Name: Michele Brewer MRN: [id number] Date of Birth: [birth year]   Medicare Important Message Given:  N/A - LOS <3 / Initial given by admissions     [name] 06/06/2023, 8:18 AM

## 2023-06-06 NOTE — TOC Progression Note (Signed)
Transition of Care [facility]) - Progression Note    Patient Details  Name: Michele Brewer MRN: [id number] Date of Birth: 1950/10/06  Transition of Care [facility]) CM/SW Contact  Chapman Fitch, RN Phone Number: 06/06/2023, 4:46 PM  Clinical Narrative:     TOC notified that [facility] offering peer to Peer for SNF and EMS To be completed by noon tomorrow with dr Logan Bores [phone].  MD notified    Expected Discharge Plan: Skilled Nursing Facility Barriers to Discharge: Continued Medical Work up  Expected Discharge Plan and Services       Living arrangements for the past 2 months: Single Family Home                                       Social Determinants of Health (SDOH) Interventions SDOH Screenings   Food Insecurity: No Food Insecurity (06/02/2023)  Housing: Low Risk  (06/02/2023)  Transportation Needs: No Transportation Needs (06/02/2023)  Utilities: Not At Risk (06/02/2023)  Tobacco Use: Low Risk  (06/02/2023)    Readmission Risk Interventions     No data to display

## 2023-06-06 NOTE — Progress Notes (Signed)
Physical Therapy Treatment Patient Details Name: Michele Brewer MRN: [id number] DOB: [birth year] Today's Date: 06/06/2023   History of Present Illness Pt is a 72 year old female presenting to the ED with multiple falls, COVID 19+    PMH significant for Parkinson's disease, osteopenia, frequent falls, malnutrition with multiple vitamin deficiencies    PT Comments  Pt received in Semi-Fowler's position and agreeable to therapy.  Pt performed well with all tasks related and was able to progress to using therapist HHA for ambulation.  Pt typically utilizes a cane for mobility at home, so trial with HHA was utilized to mimic that setup for home.  Pt moved well, however due to narrow gait she tends to veer at times to the L and R.  Pt is able to self correct, however had one incident during the initial phase of ambulation where she required some assistance from the therapist to regain balance.  Pt able to ambulate increased distance, and is requesting to ambulate more if able to.  Pt to continue to mobilize with other hospital staff.  Pt left in recliner with all needs met.   If plan is discharge home, recommend the following: A little help with walking and/or transfers;Assistance with cooking/housework;Help with stairs or ramp for entrance   Can travel by private vehicle        Equipment Recommendations  Other (comment)    Recommendations for Other Services       Precautions / Restrictions Precautions Precautions: Fall Restrictions Weight Bearing Restrictions: No     Mobility  Bed Mobility Overal bed mobility: Needs Assistance Bed Mobility: Supine to Sit     Supine to sit: Supervision, Used rails          Transfers Overall transfer level: Needs assistance Equipment used: None Transfers: Sit to/from Stand Sit to Stand: Supervision   Step pivot transfers: Contact guard assist       General transfer comment: VC's for safe hand placement and sequencing RW with steps to  recliner.    Ambulation/Gait Ambulation/Gait assistance: Contact guard assist Gait Distance (Feet): 320 Feet Assistive device: 1 person hand held assist Gait Pattern/deviations: Step-through pattern, Narrow base of support, Drifts right/left       General Gait Details: No SOB while ambulating in hall on RA   Stairs             Wheelchair Mobility     Tilt Bed    Modified Rankin (Stroke Patients Only)       Balance Overall balance assessment: Needs assistance Sitting-balance support: No upper extremity supported Sitting balance-Leahy Scale: Good Sitting balance - Comments: consistent posterior lean. Can correct with VC's.   Standing balance support: During functional activity, No upper extremity supported Standing balance-Leahy Scale: Fair                              Cognition Arousal: Alert Behavior During Therapy: WFL for tasks assessed/performed Overall Cognitive Status: Within Functional Limits for tasks assessed                                          Exercises      General Comments        Pertinent Vitals/Pain Pain Assessment Pain Assessment: No/denies pain    Home Living  Prior Function            PT Goals (current goals can now be found in the care plan section) Acute Rehab PT Goals Patient Stated Goal: to return home with mom and sister PT Goal Formulation: With patient Time For Goal Achievement: 06/17/23 Potential to Achieve Goals: Fair Progress towards PT goals: Progressing toward goals    Frequency    Min 1X/week      PT Plan      Co-evaluation              AM-PAC PT "6 Clicks" Mobility   Outcome Measure  Help needed turning from your back to your side while in a flat bed without using bedrails?: A Lot Help needed moving from lying on your back to sitting on the side of a flat bed without using bedrails?: A Lot Help needed moving to and from a  bed to a chair (including a wheelchair)?: A Little Help needed standing up from a chair using your arms (e.g., wheelchair or bedside chair)?: A Little Help needed to walk in hospital room?: A Little Help needed climbing 3-5 steps with a railing? : A Lot 6 Click Score: 15    End of Session Equipment Utilized During Treatment: Gait belt Activity Tolerance: Patient tolerated treatment well Patient left: in chair;with call bell/phone within reach;with chair alarm set Nurse Communication: Mobility status PT Visit Diagnosis: Unsteadiness on feet (R26.81);Other abnormalities of gait and mobility (R26.89);Muscle weakness (generalized) (M62.81);History of falling (Z91.81);Difficulty in walking, not elsewhere classified (R26.2);Other symptoms and signs involving the nervous system (R29.898)     Time: 1202-1233 PT Time Calculation (min) (ACUTE ONLY): 31 min  Charges:    $Therapeutic Activity: 23-37 mins PT General Charges $$ ACUTE PT VISIT: 1 Visit                     Nolon Bussing, PT, DPT Physical Therapist - [facility]  06/06/23, 12:56 PM

## 2023-06-06 NOTE — TOC Progression Note (Signed)
Transition of Care [facility]) - Progression Note    Patient Details  Name: Michele Brewer MRN: [id number] Date of Birth: 1951-05-19  Transition of Care [facility]) CM/SW Contact  Chapman Fitch, RN Phone Number: 06/06/2023, 3:52 PM  Clinical Narrative:     Insurance auth Pending for SNF  Expected Discharge Plan: Skilled Nursing Facility Barriers to Discharge: Continued Medical Work up  Expected Discharge Plan and Services       Living arrangements for the past 2 months: Single Family Home                                       Social Determinants of Health (SDOH) Interventions SDOH Screenings   Food Insecurity: No Food Insecurity (06/02/2023)  Housing: Low Risk  (06/02/2023)  Transportation Needs: No Transportation Needs (06/02/2023)  Utilities: Not At Risk (06/02/2023)  Tobacco Use: Low Risk  (06/02/2023)    Readmission Risk Interventions     No data to display

## 2023-06-06 NOTE — Progress Notes (Signed)
PROGRESS NOTE    Michele Brewer  JXB:[id number] DOB: [birth year] DOA: 06/02/2023 PCP: Barbette Reichmann, MD    Chief Complaint  Patient presents with   Fall    Brief Narrative:  Patient 72 year old female with history of PD, osteopenia, frequent falls, malnutrition with multiple vitamin deficiencies, presented to ED with multiple falls. Patient noted to have recently tested positive for COVID. Patient noted to have elevated CK, CT head negative.   Assessment & Plan:   Principal Problem:   Ambulatory dysfunction Active Problems:   COVID-19 virus infection   Parkinson disease   Vitamin B12 deficiency   Elevated CK  Ambulatory dysfunction, Recurrent falls History of ambulatory dysfunction and frequent falls in the setting of Parkinson disease with vertigo. Noted to have worsening lower extremity dysfunction and frequent falls in the setting of COVID-19 infection causing increased generalized weakness. -CK elevated and trending down with hydration.   -Saline lock IV fluids.   -PT OT recommending SNF placement & TOC is following   COVID-19 virus infection -Patient presented with 2-day history of worsening fatigue, cough, congestion, COVID-19 PCR positive. -Patient with no significant shortness of breath.  No hypoxia. -Continue Paxlovid, zinc, vitamin C, Flonase, Claritin, PPI, scheduled Combivent.   Vitamin B12 deficiency -Vitamin B12 level of 168. -Continue Vitamin B12 1000 mcg subcu daily x 5 days, then weekly x 1 month, then monthly. -Outpatient follow-up with PCP  Elevated CK levels -CK of 2060 >>>> 978>>> 557 -Saline lock IV fluids.  Parkinson's disease -Sinemet.   -PT/OT.  Hypokalemia -Repleted.   DVT prophylaxis: Lovenox Code Status: Full Family Communication: Updated patient.  No family at bedside. Disposition: SNF  Status is: Inpatient The patient will require care spanning > 2 midnights and should be moved to inpatient because: Severity of illness    Consultants:  None  Procedures:  CT head 06/02/2023 Chest x-ray 06/02/2023  Antimicrobials: Anti-infectives (From admission, onward)    Start     Dose/Rate Route Frequency Ordered Stop   06/06/23 1600  cefTRIAXone (ROCEPHIN) 1 g in sodium chloride 0.9 % 100 mL IVPB        1 g 200 mL/hr over 30 Minutes Intravenous Every 24 hours 06/06/23 1532     06/03/23 1015  nirmatrelvir/ritonavir (PAXLOVID) 3 tablet        3 tablet Oral 2 times daily 06/03/23 0915 06/08/23 0959         Subjective: Pt awake resting in bed this AM.  Reports feeling okay, has been getting herself up to bathroom when it takes staff too long to come help her.  Feels like strength is slowly improving.  No other acute complaints.    Objective: Vitals:   06/05/23 1513 06/05/23 2041 06/06/23 0418 06/06/23 0700  BP: (!) 146/68 (!) 144/71 108/71 (!) 168/75  Pulse: 64 63 76 65  Resp: 18 18 18 18   Temp: (!) 97.2 F (36.2 C) 98.3 F (36.8 C) 97.8 F (36.6 C) 98.3 F (36.8 C)  TempSrc:  Oral Oral Oral  SpO2: 100% 100% 99% 100%  Weight:      Height:        Intake/Output Summary (Last 24 hours) at 06/06/2023 1604 Last data filed at 06/06/2023 0237 Gross per 24 hour  Intake 0 ml  Output 0 ml  Net 0 ml   Filed Weights   06/02/23 0651  Weight: 64 kg    Examination:  General exam: awake, alert, no acute distress HEENT: atraumatic, clear conjunctiva, anicteric sclera, moist mucus membranes, hearing  grossly normal  Respiratory system: CTA, no wheezes, rales or rhonchi, normal respiratory effort. Cardiovascular system: normal S1/S2, RRR, trace pedal edema, intact DP pulses.   Gastrointestinal system: soft, NT, ND, no HSM felt, +bowel sounds. Central nervous system: A&O x3. no gross focal neurologic deficits, normal speech Extremities: moves all, trace pedal edema, normal tone Skin: dry, intact, normal temperature Psychiatry: normal mood, congruent affect, judgement and insight appear normal     Data  Reviewed: I have personally reviewed following labs and imaging studies  CBC: Recent Labs  Lab 06/02/23 1040 06/04/23 0442 06/05/23 0723 06/06/23 0633  WBC 3.2* 2.8* 3.5* 3.3*  NEUTROABS 2.0  --   --   --   HGB 11.3* 10.7* 11.4* 12.2  HCT 33.3* 29.7* 33.4* 36.3  MCV 92.0 95.2 90.3 90.5  PLT 162 143* 151 156    Basic Metabolic Panel: Recent Labs  Lab 06/02/23 1040 06/03/23 0554 06/04/23 0442 06/05/23 0723 06/06/23 0633  NA 137 137 138 140 140  K 3.3* 3.7 3.4* 3.9 4.0  CL 100 103 104 104 102  CO2 26 25 27 29 31   GLUCOSE 84 78 93 85 79  BUN 21 20 15 10 12   CREATININE 0.72 0.60 0.61 0.67 0.76  CALCIUM 9.1 8.5* 8.5* 8.5* 9.0  MG  --  2.0  --  1.8 1.9    GFR: Estimated Creatinine Clearance: 54.1 mL/min (by C-G formula based on SCr of 0.76 mg/dL).  Liver Function Tests: Recent Labs  Lab 06/02/23 1040 06/04/23 0442 06/05/23 0723 06/06/23 0633  AST 62* 52* 41 40  ALT 24 7 12 11   ALKPHOS 87 67 73 87  BILITOT 1.2 0.6 0.4 0.8  PROT 7.1 5.7* 5.9* 7.0  ALBUMIN 3.6 2.9* 2.9* 3.4*    CBG: Recent Labs  Lab 06/04/23 0738  GLUCAP 68*     Recent Results (from the past 240 hour(s))  Resp Panel by RT-PCR (Flu A&B, Covid) Anterior Nasal Swab     Status: Abnormal   Collection Time: 06/02/23 10:40 AM   Specimen: Anterior Nasal Swab  Result Value Ref Range Status   SARS Coronavirus 2 by RT PCR POSITIVE (A) NEGATIVE Final    Comment: (NOTE) SARS-CoV-2 target nucleic acids are DETECTED.  The SARS-CoV-2 RNA is generally detectable in upper respiratory specimens during the acute phase of infection. Positive results are indicative of the presence of the identified virus, but do not rule out bacterial infection or co-infection with other pathogens not detected by the test. Clinical correlation with patient history and other diagnostic information is necessary to determine patient infection status. The expected result is Negative.  Fact Sheet for  Patients: BloggerCourse.com  Fact Sheet for Healthcare Providers: SeriousBroker.it  This test is not yet approved or cleared by the [place] FDA and  has been authorized for detection and/or diagnosis of SARS-CoV-2 by FDA under an Emergency Use Authorization (EUA).  This EUA will remain in effect (meaning this test can be used) for the duration of  the COVID-19 declaration under Section 564(b)(1) of the A ct, 21 U.S.C. section 360bbb-3(b)(1), unless the authorization is terminated or revoked sooner.     Influenza A by PCR NEGATIVE NEGATIVE Final   Influenza B by PCR NEGATIVE NEGATIVE Final    Comment: (NOTE) The Xpert Xpress SARS-CoV-2/FLU/RSV plus assay is intended as an aid in the diagnosis of influenza from Nasopharyngeal swab specimens and should not be used as a sole basis for treatment. Nasal washings and aspirates are unacceptable for  Xpert Xpress SARS-CoV-2/FLU/RSV testing.  Fact Sheet for Patients: BloggerCourse.com  Fact Sheet for Healthcare Providers: SeriousBroker.it  This test is not yet approved or cleared by the [place] FDA and has been authorized for detection and/or diagnosis of SARS-CoV-2 by FDA under an Emergency Use Authorization (EUA). This EUA will remain in effect (meaning this test can be used) for the duration of the COVID-19 declaration under Section 564(b)(1) of the Act, 21 U.S.C. section 360bbb-3(b)(1), unless the authorization is terminated or revoked.  Performed at [facility], [address], [place], [place] [address]          Radiology Studies: No results found.      Scheduled Meds:  ascorbic acid  500 mg Oral Daily   carbidopa-levodopa  1.5 tablet Oral TID   cyanocobalamin  1,000 mcg Subcutaneous Q24H   DULoxetine  30 mg Oral BID   enoxaparin (LOVENOX) injection  40 mg Subcutaneous Q24H   feeding  supplement (NEPRO CARB STEADY)  237 mL Oral TID BM   fluticasone  2 spray Each Nare Daily   folic acid  1 mg Oral Daily   gabapentin  100 mg Oral TID   Ipratropium-Albuterol  1 puff Inhalation Q6H   loratadine  10 mg Oral Daily   multivitamin with minerals  1 tablet Oral Daily   nirmatrelvir/ritonavir  3 tablet Oral BID   pantoprazole  40 mg Oral Daily   sodium chloride flush  3 mL Intravenous Q12H   thiamine  100 mg Oral Daily   Vitamin D (Ergocalciferol)  50,000 Units Oral Q7 days   zinc sulfate  220 mg Oral Daily   Continuous Infusions:  cefTRIAXone (ROCEPHIN)  IV        LOS: 3 days    Time spent: 35 minutes    Pennie Banter, DO [facility]   To contact the attending provider between 7A-7P or the covering provider during after hours 7P-7A, please log into the web site [email or web address] and access using universal [place] password for that web site. If you do not have the password, please call the hospital operator.  06/06/2023, 4:04 PM

## 2023-06-07 DIAGNOSIS — R262 Difficulty in walking, not elsewhere classified: Secondary | ICD-10-CM | POA: Diagnosis not present

## 2023-06-07 LAB — URINE CULTURE: Culture: <10000 — AB

## 2023-06-07 NOTE — Plan of Care (Signed)

## 2023-06-07 NOTE — Progress Notes (Signed)
Occupational Therapy Treatment Patient Details Name: Michele Brewer MRN: [id number] DOB: Nov 01, 1950 Today's Date: 06/07/2023   History of present illness Pt is a 72 year old female presenting to the ED with multiple falls, COVID 19+    PMH significant for Parkinson's disease, osteopenia, frequent falls, malnutrition with multiple vitamin deficiencies   OT comments  Pt seen for OT tx this date. Completes mobility t/f bathroom with RW, supervision and OT managing IV pole. Toliet task with supervision and washes hands standing at sink. Pt with slow movements at times but no LOB. Good safety awareness. Performs 4x STS in 30 seconds working on activity tolerance and functional transfers. Education and demonstration provided in additional therapeutic activity as described in flowsheet for overall strengthening for ADL task performance. Pt return demonstrating exercises with rest breaks as needed. Benefits from visual demonstration for optimal task performance.  Pt making good progress toward goals, will continue to follow POC. Discharge recommendation remains appropriate.        If plan is discharge home, recommend the following:  A little help with walking and/or transfers;A lot of help with bathing/dressing/bathroom;Assistance with cooking/housework;Help with stairs or ramp for entrance;Assist for transportation   Equipment Recommendations  None recommended by OT;Other (comment)    Recommendations for Other Services Other (comment)    Precautions / Restrictions Precautions Precautions: Fall Restrictions Weight Bearing Restrictions: No       Mobility Bed Mobility                    Transfers Overall transfer level: Needs assistance Equipment used: None Transfers: Sit to/from Stand Sit to Stand: Supervision                 Balance Overall balance assessment: Needs assistance Sitting-balance support: No upper extremity supported Sitting balance-Leahy Scale: Good    Postural control: Posterior lean Standing balance support: During functional activity, Bilateral upper extremity supported Standing balance-Leahy Scale: Fair                             ADL either performed or assessed with clinical judgement   ADL Overall ADL's : Needs assistance/impaired     Grooming: Wash/dry Audiological scientist: Supervision/safety;Cueing for safety;Ambulation;Rolling walker (2 wheels) Toilet Transfer Details (indicate cue type and reason): assist for IV pole Toileting- Clothing Manipulation and Hygiene: Supervision/safety                Cognition Arousal: Alert Behavior During Therapy: WFL for tasks assessed/performed Overall Cognitive Status: Within Functional Limits for tasks assessed                                 General Comments: Very pleasant and cooperative        Exercises Exercises: General Upper Extremity, Other exercises General Exercises - Upper Extremity Shoulder Flexion: Strengthening, 10 reps, Seated, AROM Shoulder ABduction: AROM, Strengthening, Both, 10 reps Other Exercises Other Exercises: Pt performing 4 sit to stand transfers in 30 seconds Other Exercises: Edu provided on performance of functional sit to stands for overall strengthening and balance, provided pt with UB exercises with static holds for strengthening. Discussed progression with TB as able            Pertinent Vitals/ Pain       Pain  Assessment Pain Assessment: No/denies pain   Frequency  Min 1X/week        Progress Toward Goals  OT Goals(current goals can now be found in the care plan section)  Progress towards OT goals: Progressing toward goals  Acute Rehab OT Goals OT Goal Formulation: With patient Time For Goal Achievement: 06/17/23 Potential to Achieve Goals: Fair  Plan         AM-PAC OT "6 Clicks" Daily Activity     Outcome Measure   Help from another person eating  meals?: None Help from another person taking care of personal grooming?: None Help from another person toileting, which includes using toliet, bedpan, or urinal?: A Little Help from another person bathing (including washing, rinsing, drying)?: A Little Help from another person to put on and taking off regular upper body clothing?: None Help from another person to put on and taking off regular lower body clothing?: None 6 Click Score: 22    End of Session Equipment Utilized During Treatment: Rolling walker (2 wheels)  OT Visit Diagnosis: Other abnormalities of gait and mobility (R26.89);Muscle weakness (generalized) (M62.81)   Activity Tolerance Patient tolerated treatment well   Patient Left in chair;with call bell/phone within reach   Nurse Communication Mobility status        Time: 1610-9604 OT Time Calculation (min): 26 min  Charges: OT General Charges $OT Visit: 1 Visit OT Treatments $Self Care/Home Management : 8-22 mins $Therapeutic Activity: 8-22 mins  Laportia Carley L. Irl Bodie, OTR/L  06/07/23, 4:44 PM

## 2023-06-07 NOTE — Progress Notes (Signed)
PROGRESS NOTE    Michele Brewer  AOZ:[id number] DOB: September 28, 1950 DOA: 06/02/2023 PCP: Barbette Reichmann, MD    Chief Complaint  Patient presents with   Fall    Brief Narrative:  Patient 72 year old female with history of PD, osteopenia, frequent falls, malnutrition with multiple vitamin deficiencies, presented to ED with multiple falls. Patient noted to have recently tested positive for COVID. Patient noted to have elevated CK, CT head negative.  Further hospital course and management as outlined below.   Assessment & Plan:   Principal Problem:   Ambulatory dysfunction Active Problems:   COVID-19 virus infection   Parkinson disease   Vitamin B12 deficiency   Elevated CK  Ambulatory dysfunction, Recurrent falls History of ambulatory dysfunction and frequent falls in the setting of Parkinson disease with vertigo. Noted to have worsening lower extremity dysfunction and frequent falls in the setting of COVID-19 infection causing increased generalized weakness. -CK elevated and trending down with hydration.   -Saline lock IV fluids.   -Continue PT OT --- rec is SNF or HH -HHPT/OT at time of discharge, pt mobility is improving   COVID-19 virus infection -Patient presented with 2-day history of worsening fatigue, cough, congestion, COVID-19 PCR positive. -Patient with no significant shortness of breath.  No hypoxia. -Treated w Paxlovid, zinc, vitamin C, Flonase, Claritin, PPI, scheduled Combivent.   Vitamin B12 deficiency -Vitamin B12 level of 168. -Continue Vitamin B12 1000 mcg subcu daily x 5 days, then weekly x 1 month, then monthly. -Outpatient follow-up with PCP  Elevated CK levels -CK of 2060 >>>> 978>>> 557 Improved with IV hydration -repeat CK level with AM labs  Parkinson's disease -Sinemet.   -PT/OT.  Hypokalemia -Repleted.   DVT prophylaxis: Lovenox Code Status: Full Family Communication: Updated patient.  No family at bedside. Disposition:  SNF  Status is: Inpatient The patient will require care spanning > 2 midnights and should be moved to inpatient because: Severity of illness   Consultants:  None  Procedures:  CT head 06/02/2023 Chest x-ray 06/02/2023  Antimicrobials: Anti-infectives (From admission, onward)    Start     Dose/Rate Route Frequency Ordered Stop   06/06/23 1600  cefTRIAXone (ROCEPHIN) 1 g in sodium chloride 0.9 % 100 mL IVPB        1 g 200 mL/hr over 30 Minutes Intravenous Every 24 hours 06/06/23 1532     06/03/23 1015  nirmatrelvir/ritonavir (PAXLOVID) 3 tablet        3 tablet Oral 2 times daily 06/03/23 0915 06/08/23 0959         Subjective: Pt awake resting in bed this AM.  Reports she is slowly feeling better and regaining strength. States at baseline, she does not require any assistance, still needing some.  Does not feel like she needs to go to rehab at this point.  Initially was not even able to stand up.  Still feels quite weak for going home but hopefully ready in another day or two   Objective: Vitals:   06/06/23 1620 06/06/23 2153 06/07/23 0544 06/07/23 0700  BP: (!) 146/75 (!) 142/68 126/79 136/68  Pulse: 63 (!) 59 64 63  Resp: 18 16 16 18   Temp: 98.5 F (36.9 C) 98 F (36.7 C) 98.4 F (36.9 C) 98.1 F (36.7 C)  TempSrc: Oral   Oral  SpO2: 100% 100% 100% 99%  Weight:      Height:        Intake/Output Summary (Last 24 hours) at 06/07/2023 1700 Last data filed at 06/07/2023  7425 Gross per 24 hour  Intake 3 ml  Output --  Net 3 ml   Filed Weights   06/02/23 0651  Weight: 64 kg    Examination:  General exam: awake, alert, no acute distress HEENT: moist mucus membranes, hearing grossly normal  Respiratory system: on room air, normal respiratory effort. Cardiovascular system: RRR, trace pedal edema Gastrointestinal system: soft, NT, ND Central nervous system: A&O x3. no gross focal neurologic deficits, normal speech Extremities: moves all, trace pedal edema, normal  tone Skin: dry, intact, normal temperature Psychiatry: normal mood, congruent affect, judgement and insight appear normal     Data Reviewed: I have personally reviewed following labs and imaging studies  CBC: Recent Labs  Lab 06/02/23 1040 06/04/23 0442 06/05/23 0723 06/06/23 0633  WBC 3.2* 2.8* 3.5* 3.3*  NEUTROABS 2.0  --   --   --   HGB 11.3* 10.7* 11.4* 12.2  HCT 33.3* 29.7* 33.4* 36.3  MCV 92.0 95.2 90.3 90.5  PLT 162 143* 151 156    Basic Metabolic Panel: Recent Labs  Lab 06/02/23 1040 06/03/23 0554 06/04/23 0442 06/05/23 0723 06/06/23 0633  NA 137 137 138 140 140  K 3.3* 3.7 3.4* 3.9 4.0  CL 100 103 104 104 102  CO2 26 25 27 29 31   GLUCOSE 84 78 93 85 79  BUN 21 20 15 10 12   CREATININE 0.72 0.60 0.61 0.67 0.76  CALCIUM 9.1 8.5* 8.5* 8.5* 9.0  MG  --  2.0  --  1.8 1.9    GFR: Estimated Creatinine Clearance: 54.1 mL/min (by C-G formula based on SCr of 0.76 mg/dL).  Liver Function Tests: Recent Labs  Lab 06/02/23 1040 06/04/23 0442 06/05/23 0723 06/06/23 0633  AST 62* 52* 41 40  ALT 24 7 12 11   ALKPHOS 87 67 73 87  BILITOT 1.2 0.6 0.4 0.8  PROT 7.1 5.7* 5.9* 7.0  ALBUMIN 3.6 2.9* 2.9* 3.4*    CBG: Recent Labs  Lab 06/04/23 0738  GLUCAP 68*     Recent Results (from the past 240 hour(s))  Resp Panel by RT-PCR (Flu A&B, Covid) Anterior Nasal Swab     Status: Abnormal   Collection Time: 06/02/23 10:40 AM   Specimen: Anterior Nasal Swab  Result Value Ref Range Status   SARS Coronavirus 2 by RT PCR POSITIVE (A) NEGATIVE Final    Comment: (NOTE) SARS-CoV-2 target nucleic acids are DETECTED.  The SARS-CoV-2 RNA is generally detectable in upper respiratory specimens during the acute phase of infection. Positive results are indicative of the presence of the identified virus, but do not rule out bacterial infection or co-infection with other pathogens not detected by the test. Clinical correlation with patient history and other diagnostic  information is necessary to determine patient infection status. The expected result is Negative.  Fact Sheet for Patients: BloggerCourse.com  Fact Sheet for Healthcare Providers: SeriousBroker.it  This test is not yet approved or cleared by the [place] FDA and  has been authorized for detection and/or diagnosis of SARS-CoV-2 by FDA under an Emergency Use Authorization (EUA).  This EUA will remain in effect (meaning this test can be used) for the duration of  the COVID-19 declaration under Section 564(b)(1) of the A ct, 21 U.S.C. section 360bbb-3(b)(1), unless the authorization is terminated or revoked sooner.     Influenza A by PCR NEGATIVE NEGATIVE Final   Influenza B by PCR NEGATIVE NEGATIVE Final    Comment: (NOTE) The Xpert Xpress SARS-CoV-2/FLU/RSV plus assay is intended  as an aid in the diagnosis of influenza from Nasopharyngeal swab specimens and should not be used as a sole basis for treatment. Nasal washings and aspirates are unacceptable for Xpert Xpress SARS-CoV-2/FLU/RSV testing.  Fact Sheet for Patients: BloggerCourse.com  Fact Sheet for Healthcare Providers: SeriousBroker.it  This test is not yet approved or cleared by the [place] FDA and has been authorized for detection and/or diagnosis of SARS-CoV-2 by FDA under an Emergency Use Authorization (EUA). This EUA will remain in effect (meaning this test can be used) for the duration of the COVID-19 declaration under Section 564(b)(1) of the Act, 21 U.S.C. section 360bbb-3(b)(1), unless the authorization is terminated or revoked.  Performed at [facility], [address] [place], [place] [address]   Urine Culture     Status: Abnormal   Collection Time: 06/06/23  3:00 PM   Specimen: Urine, Random  Result Value Ref Range Status   Specimen Description   Final    URINE, RANDOM Performed at  [facility], [address] [place], [place] [address]    Special Requests   Final    NONE Performed at [facility], [address] [place], [place] [address]    Culture (A)  Final    <10,000 COLONIES/mL INSIGNIFICANT GROWTH Performed at [facility], [address], [place], [place] [address]    Report Status 06/07/2023 FINAL  Final         Radiology Studies: No results found.      Scheduled Meds:  ascorbic acid  500 mg Oral Daily   carbidopa-levodopa  1.5 tablet Oral TID   cyanocobalamin  1,000 mcg Subcutaneous Q24H   DULoxetine  30 mg Oral BID   enoxaparin (LOVENOX) injection  40 mg Subcutaneous Q24H   feeding supplement (NEPRO CARB STEADY)  237 mL Oral TID BM   fluticasone  2 spray Each Nare Daily   folic acid  1 mg Oral Daily   gabapentin  100 mg Oral TID   Ipratropium-Albuterol  1 puff Inhalation Q6H   loratadine  10 mg Oral Daily   multivitamin with minerals  1 tablet Oral Daily   nirmatrelvir/ritonavir  3 tablet Oral BID   pantoprazole  40 mg Oral Daily   sodium chloride flush  3 mL Intravenous Q12H   thiamine  100 mg Oral Daily   Vitamin D (Ergocalciferol)  50,000 Units Oral Q7 days   zinc sulfate  220 mg Oral Daily   Continuous Infusions:  cefTRIAXone (ROCEPHIN)  IV 1 g (06/07/23 1511)      LOS: 4 days    Time spent: 35 minutes    Pennie Banter, DO [facility]   To contact the attending provider between 7A-7P or the covering provider during after hours 7P-7A, please log into the web site [email or web address] and access using universal [place] password for that web site. If you do not have the password, please call the hospital operator.  06/07/2023, 5:00 PM

## 2023-06-07 NOTE — TOC Progression Note (Signed)
Transition of Care [facility]) - Progression Note    Patient Details  Name: Michele Brewer MRN: [id number] Date of Birth: 1951/02/19  [facility] CM/SW Contact  Chapman Fitch, RN Phone Number: 06/07/2023, 4:56 PM  Clinical Narrative:    MD met with patient and discuss SNF vs home Plan at this time for home with home health services.    Peer to Peer not to be completed.  TOC to follow up with patient about arranging home health    Expected Discharge Plan: Skilled Nursing Facility Barriers to Discharge: Continued Medical Work up  Expected Discharge Plan and Services       Living arrangements for the past 2 months: Single Family Home                                       Social Determinants of Health (SDOH) Interventions SDOH Screenings   Food Insecurity: No Food Insecurity (06/02/2023)  Housing: Low Risk  (06/02/2023)  Transportation Needs: No Transportation Needs (06/02/2023)  Utilities: Not At Risk (06/02/2023)  Tobacco Use: Low Risk  (06/02/2023)    Readmission Risk Interventions     No data to display

## 2023-06-08 DIAGNOSIS — R262 Difficulty in walking, not elsewhere classified: Secondary | ICD-10-CM | POA: Diagnosis not present

## 2023-06-08 LAB — BASIC METABOLIC PANEL
Anion gap: 9 (ref 5–15)
BUN: 17 mg/dL (ref 8–23)
CO2: 30 mmol/L (ref 22–32)
Calcium: 9 mg/dL (ref 8.9–10.3)
Chloride: 100 mmol/L (ref 98–111)
Creatinine, Ser: 0.79 mg/dL (ref 0.44–1.00)
GFR, Estimated: >60 mL/min (ref >60)
Glucose, Bld: 85 mg/dL (ref 70–99)
Potassium: 4.2 mmol/L (ref 3.5–5.1)
Sodium: 139 mmol/L (ref 135–145)

## 2023-06-08 LAB — CK: Total CK: 136 U/L (ref 38–234)

## 2023-06-08 MED ORDER — MECLIZINE HCL 25 MG PO TABS
25.0000 mg | ORAL_TABLET | Freq: Three times a day (TID) | ORAL | Status: DC | PRN
  Administered 2023-06-08: 25 mg via ORAL
  Filled 2023-06-08 (×2): qty 1

## 2023-06-08 NOTE — Care Management Important Message (Signed)
Important Message  Patient Details  Name: Michele Brewer MRN: [id number] Date of Birth: 02/27/1951   Medicare Important Message Given:  Yes  Reviewed Medicare IM with patient via room phone.  Declined copy of Medicare IM at this time.   [name] 06/08/2023, 12:03 PM

## 2023-06-08 NOTE — TOC Progression Note (Signed)
Transition of Care [facility]) - Progression Note    Patient Details  Name: Michele Brewer MRN: [id number] Date of Birth: May 26, 1951  Transition of Care [facility]) CM/SW Contact  Chapman Fitch, RN Phone Number: 06/08/2023, 3:16 PM  Clinical Narrative:     Due to covid isolation spoke with patient via phone. She is in agreement to home health services and states she does  not have a preference of home health agency.  Referral made and accepted by Coralee North with [facility]   Expected Discharge Plan: Skilled Nursing Facility Barriers to Discharge: Continued Medical Work up  Expected Discharge Plan and Services       Living arrangements for the past 2 months: Single Family Home                                       Social Determinants of Health (SDOH) Interventions SDOH Screenings   Food Insecurity: No Food Insecurity (06/02/2023)  Housing: Low Risk  (06/02/2023)  Transportation Needs: No Transportation Needs (06/02/2023)  Utilities: Not At Risk (06/02/2023)  Tobacco Use: Low Risk  (06/02/2023)    Readmission Risk Interventions     No data to display

## 2023-06-08 NOTE — Progress Notes (Signed)
PROGRESS NOTE    Michele Brewer  MVH:[id number] DOB: [birth year] DOA: 06/02/2023 PCP: Barbette Reichmann, MD    Chief Complaint  Patient presents with   Fall    Brief Narrative:  Patient 72 year old female with history of PD, osteopenia, frequent falls, malnutrition with multiple vitamin deficiencies, presented to ED with multiple falls. Patient noted to have recently tested positive for COVID. Patient noted to have elevated CK, CT head negative.  Further hospital course and management as outlined below.   Assessment & Plan:   Principal Problem:   Ambulatory dysfunction Active Problems:   COVID-19 virus infection   Parkinson disease   Vitamin B12 deficiency   Elevated CK  Ambulatory dysfunction, Recurrent falls History of ambulatory dysfunction and frequent falls in the setting of Parkinson disease with vertigo. Noted to have worsening lower extremity dysfunction and frequent falls in the setting of COVID-19 infection causing increased generalized weakness. -Continue PT OT --- rec is SNF or HH -HHPT/OT at time of discharge, pt mobility is improving   COVID-19 virus infection -Patient presented with 2-day history of worsening fatigue, cough, congestion, COVID-19 PCR positive. -Patient with no significant shortness of breath.  No hypoxia. -Treated w Paxlovid, zinc, vitamin C, Flonase, Claritin, PPI, scheduled Combivent.   Vertigo -- trial meclizine PRN -Neurology follow up outpatient  Vitamin B12 deficiency -Vitamin B12 level of 168. -Continue Vitamin B12 1000 mcg subcu daily x 5 days, then weekly x 1 month, then monthly. -Outpatient follow-up with PCP  Elevated CK levels - resolved -CK of 2060 >>>> 978>>> 557 >> 136 normalized Improved with IV hydration -repeat CK level with AM labs  Parkinson's disease -Sinemet.   -PT/OT.  Hypokalemia -Repleted.   DVT prophylaxis: Lovenox Code Status: Full Family Communication: Updated patient.  No family at  bedside. Disposition: Home with HH in 24-48 hours pending clinical improvement. Barrier -- vertigo dizziness today  Status is: Inpatient    Consultants:  None  Procedures:  CT head 06/02/2023 Chest x-ray 06/02/2023  Antimicrobials: Anti-infectives (From admission, onward)    Start     Dose/Rate Route Frequency Ordered Stop   06/06/23 1600  cefTRIAXone (ROCEPHIN) 1 g in sodium chloride 0.9 % 100 mL IVPB        1 g 200 mL/hr over 30 Minutes Intravenous Every 24 hours 06/06/23 1532     06/03/23 1015  nirmatrelvir/ritonavir (PAXLOVID) 3 tablet        3 tablet Oral 2 times daily 06/03/23 0915 06/07/23 2036         Subjective: Pt seen up in recliner just wrapping up work with PT.  She reports dizziness, nausea and a headache this AM.  Dizziness is not "room spinning" but she notes things "shifting" in her vision, and prior history of vertigo.  Agreeable to try meclizine.  She was not very steady with mobility, limited by dizziness today.     Objective: Vitals:   06/07/23 2028 06/08/23 0910 06/08/23 1045 06/08/23 1050  BP: 125/65 (!) 110/59 97/68 112/75  Pulse: 73 80 86 80  Resp: 16 15    Temp: 97.6 F (36.4 C) 98.5 F (36.9 C)    TempSrc: Oral Oral    SpO2: 100% 100%    Weight:      Height:        Intake/Output Summary (Last 24 hours) at 06/08/2023 1513 Last data filed at 06/08/2023 0902 Gross per 24 hour  Intake 3 ml  Output --  Net 3 ml   [employer]  06/02/23 0651  Weight: 64 kg    Examination:  General exam: awake, alert, no acute distress, mildly-ill appearing HEENT: moist mucus membranes, hearing grossly normal  Respiratory system: on room air, normal respiratory effort. Cardiovascular system: RRR, trace pedal edema Gastrointestinal system: soft, NT, ND Central nervous system: A&O x3. no gross focal neurologic deficits, normal speech Extremities: moves all, trace pedal edema, normal tone Skin: dry, intact, normal temperature Psychiatry: normal mood,  congruent affect, judgement and insight appear normal     Data Reviewed: I have personally reviewed following labs and imaging studies  CBC: Recent Labs  Lab 06/02/23 1040 06/04/23 0442 06/05/23 0723 06/06/23 0633  WBC 3.2* 2.8* 3.5* 3.3*  NEUTROABS 2.0  --   --   --   HGB 11.3* 10.7* 11.4* 12.2  HCT 33.3* 29.7* 33.4* 36.3  MCV 92.0 95.2 90.3 90.5  PLT 162 143* 151 156    Basic Metabolic Panel: Recent Labs  Lab 06/03/23 0554 06/04/23 0442 06/05/23 0723 06/06/23 0633 06/08/23 0638  NA 137 138 140 140 139  K 3.7 3.4* 3.9 4.0 4.2  CL 103 104 104 102 100  CO2 25 27 29 31 30   GLUCOSE 78 93 85 79 85  BUN 20 15 10 12 17   CREATININE 0.60 0.61 0.67 0.76 0.79  CALCIUM 8.5* 8.5* 8.5* 9.0 9.0  MG 2.0  --  1.8 1.9  --     GFR: Estimated Creatinine Clearance: 54.1 mL/min (by C-G formula based on SCr of 0.79 mg/dL).  Liver Function Tests: Recent Labs  Lab 06/02/23 1040 06/04/23 0442 06/05/23 0723 06/06/23 0633  AST 62* 52* 41 40  ALT 24 7 12 11   ALKPHOS 87 67 73 87  BILITOT 1.2 0.6 0.4 0.8  PROT 7.1 5.7* 5.9* 7.0  ALBUMIN 3.6 2.9* 2.9* 3.4*    CBG: Recent Labs  Lab 06/04/23 0738  GLUCAP 68*     Recent Results (from the past 240 hour(s))  Resp Panel by RT-PCR (Flu A&B, Covid) Anterior Nasal Swab     Status: Abnormal   Collection Time: 06/02/23 10:40 AM   Specimen: Anterior Nasal Swab  Result Value Ref Range Status   SARS Coronavirus 2 by RT PCR POSITIVE (A) NEGATIVE Final    Comment: (NOTE) SARS-CoV-2 target nucleic acids are DETECTED.  The SARS-CoV-2 RNA is generally detectable in upper respiratory specimens during the acute phase of infection. Positive results are indicative of the presence of the identified virus, but do not rule out bacterial infection or co-infection with other pathogens not detected by the test. Clinical correlation with patient history and other diagnostic information is necessary to determine patient infection status. The  expected result is Negative.  Fact Sheet for Patients: BloggerCourse.com  Fact Sheet for Healthcare Providers: SeriousBroker.it  This test is not yet approved or cleared by the [place] FDA and  has been authorized for detection and/or diagnosis of SARS-CoV-2 by FDA under an Emergency Use Authorization (EUA).  This EUA will remain in effect (meaning this test can be used) for the duration of  the COVID-19 declaration under Section 564(b)(1) of the A ct, 21 U.S.C. section 360bbb-3(b)(1), unless the authorization is terminated or revoked sooner.     Influenza A by PCR NEGATIVE NEGATIVE Final   Influenza B by PCR NEGATIVE NEGATIVE Final    Comment: (NOTE) The Xpert Xpress SARS-CoV-2/FLU/RSV plus assay is intended as an aid in the diagnosis of influenza from Nasopharyngeal swab specimens and should not be used as a sole  basis for treatment. Nasal washings and aspirates are unacceptable for Xpert Xpress SARS-CoV-2/FLU/RSV testing.  Fact Sheet for Patients: BloggerCourse.com  Fact Sheet for Healthcare Providers: SeriousBroker.it  This test is not yet approved or cleared by the [place] FDA and has been authorized for detection and/or diagnosis of SARS-CoV-2 by FDA under an Emergency Use Authorization (EUA). This EUA will remain in effect (meaning this test can be used) for the duration of the COVID-19 declaration under Section 564(b)(1) of the Act, 21 U.S.C. section 360bbb-3(b)(1), unless the authorization is terminated or revoked.  Performed at [facility], [address], [place], [place] [address]   Urine Culture     Status: Abnormal   Collection Time: 06/06/23  3:00 PM   Specimen: Urine, Random  Result Value Ref Range Status   Specimen Description   Final    URINE, RANDOM Performed at [facility], [address], [place], [place] [address]     Special Requests   Final    NONE Performed at [facility], [address] [place], [place] [address]    Culture (A)  Final    <10,000 COLONIES/mL INSIGNIFICANT GROWTH Performed at [facility], [address] [place], [place] [address]    Report Status 06/07/2023 FINAL  Final         Radiology Studies: No results found.      Scheduled Meds:  ascorbic acid  500 mg Oral Daily   carbidopa-levodopa  1.5 tablet Oral TID   cyanocobalamin  1,000 mcg Subcutaneous Q24H   DULoxetine  30 mg Oral BID   enoxaparin (LOVENOX) injection  40 mg Subcutaneous Q24H   feeding supplement (NEPRO CARB STEADY)  237 mL Oral TID BM   fluticasone  2 spray Each Nare Daily   folic acid  1 mg Oral Daily   gabapentin  100 mg Oral TID   Ipratropium-Albuterol  1 puff Inhalation Q6H   loratadine  10 mg Oral Daily   multivitamin with minerals  1 tablet Oral Daily   pantoprazole  40 mg Oral Daily   sodium chloride flush  3 mL Intravenous Q12H   thiamine  100 mg Oral Daily   Vitamin D (Ergocalciferol)  50,000 Units Oral Q7 days   zinc sulfate  220 mg Oral Daily   Continuous Infusions:  cefTRIAXone (ROCEPHIN)  IV 1 g (06/08/23 1510)      LOS: 5 days    Time spent: 35 minutes    Pennie Banter, DO [facility]   To contact the attending provider between 7A-7P or the covering provider during after hours 7P-7A, please log into the web site [email or web address] and access using universal  password for that web site. If you do not have the password, please call the hospital operator.  06/08/2023, 3:13 PM

## 2023-06-08 NOTE — Progress Notes (Signed)
Occupational Therapy Treatment Patient Details Name: Michele Brewer MRN: [id number] DOB: 1951/05/10 Today's Date: 06/08/2023   History of present illness Pt is a 72 year old female presenting to the ED with multiple falls, COVID 19+    PMH significant for Parkinson's disease, osteopenia, frequent falls, malnutrition with multiple vitamin deficiencies   OT comments  Pt received in recliner, NT present. Agreeable to OT treatment.  Session focused on UE exercises for strengthening, improving UB ADL performance and activity tolerance. Provided pt with UB exercises handout, OT providing visual/verbal demo and instruction. Pt return demos exercises with min - mod cuing, then IND performs exercises using teachback method. Pt instructed to perform static holds vs using band for resistance for optimal body mechanics. Pt verbalizes understanding, and is appreciative of instruction. Pt left in recliner, needs within reach. Pt progressing towards goals, OT will continue to follow for functional gains.       If plan is discharge home, recommend the following:  A little help with walking and/or transfers;A lot of help with bathing/dressing/bathroom;Assistance with cooking/housework;Help with stairs or ramp for entrance;Assist for transportation   Equipment Recommendations  None recommended by OT    Recommendations for Other Services Other (comment)    Precautions / Restrictions Precautions Precautions: Fall Restrictions Weight Bearing Restrictions: No       Mobility Bed Mobility               General bed mobility comments: Not performed, pt upright for session and left in recliner for breatkfast    Transfers Overall transfer level: Needs assistance Equipment used: None Transfers: Sit to/from Stand Sit to Stand: Supervision                 Balance Overall balance assessment: Needs assistance Sitting-balance support: No upper extremity supported Sitting balance-Leahy Scale:  Good   Postural control: Posterior lean Standing balance support: During functional activity, Bilateral upper extremity supported Standing balance-Leahy Scale: Fair                             ADL either performed or assessed with clinical judgement   ADL                                         General ADL Comments: Session focused on UE exercises for strengthening, improving UB ADL performance and activity toelrance      Cognition Arousal: Alert Behavior During Therapy: WFL for tasks assessed/performed Overall Cognitive Status: Within Functional Limits for tasks assessed                                 General Comments: Very pleasant and cooperative        Exercises Exercises: General Upper Extremity, Other exercises General Exercises - Upper Extremity Shoulder Flexion: AROM, Both, Strengthening, 10 reps, Seated Shoulder Horizontal ABduction: AROM, Strengthening, Both, 10 reps, Seated, Theraband (starting with static hold; progressed to red TB) Theraband Level (Shoulder Horizontal Abduction): Level 2 (Red) Shoulder Horizontal ADduction: AROM, Both, Strengthening, 10 reps Elbow Flexion: AROM, Both, 10 reps, Seated Elbow Extension: AROM, Both, 10 reps Other Exercises Other Exercises: Provided pt with UB exercises handout, OT providing visual/verbal demo and instruction. Pt return demos exercises with min - mod cuing, then IND performs exercises using teachback method. Pt instructed  to perform static holds vs using band for resistance for optimal body mechanics. Other Exercises: STS from recliner x 3            Pertinent Vitals/ Pain       Pain Assessment Pain Assessment: No/denies pain   Frequency  Min 1X/week        Progress Toward Goals  OT Goals(current goals can now be found in the care plan section)  Progress towards OT goals: Progressing toward goals  Acute Rehab OT Goals Patient Stated Goal: to get better OT  Goal Formulation: With patient Time For Goal Achievement: 06/17/23 Potential to Achieve Goals: Good  Plan         AM-PAC OT "6 Clicks" Daily Activity     Outcome Measure   Help from another person eating meals?: None Help from another person taking care of personal grooming?: None Help from another person toileting, which includes using toliet, bedpan, or urinal?: A Little Help from another person bathing (including washing, rinsing, drying)?: A Little Help from another person to put on and taking off regular upper body clothing?: A Little Help from another person to put on and taking off regular lower body clothing?: A Little 6 Click Score: 20    End of Session    OT Visit Diagnosis: Other abnormalities of gait and mobility (R26.89);Muscle weakness (generalized) (M62.81)   Activity Tolerance Patient tolerated treatment well   Patient Left in chair;with call bell/phone within reach   Nurse Communication Other (comment)        Time: 6295-2841 OT Time Calculation (min): 27 min  Charges: OT General Charges $OT Visit: 1 Visit OT Treatments $Therapeutic Exercise: 23-37 mins  Erricka Falkner L. Macrae Wiegman, OTR/L  06/08/23, 9:44 AM

## 2023-06-08 NOTE — Progress Notes (Signed)
Physical Therapy Treatment Patient Details Name: Michele Brewer MRN: [id number] DOB: Jul 10, 1951 Today's Date: 06/08/2023   History of Present Illness Michele Brewer is a 72 year old female presenting to the ED with multiple falls, COVID 19+. PMH significant for Parkinson's disease, osteopenia, frequent falls, malnutrition with multiple vitamin deficiencies    PT Comments  Pt in recliner on entry, agreeable to PT session. Pt reports vertigo upon waking this morning, has been flared up all day, really affecting her steadiness while up. Pt also reports 5/10 HA. Pt agreeable to limited session due to acute instability, practices AMB household distances with SPC. Pt is orthostatic today (as anticipated with parkinsonism), does not appear to be  making vertigo worse, however I am not confident she is well able to say for certain that she is not symptomatic as her vertigo causes big fluctuations with adjacent symptoms once up and moving around. Pt agreeable to trial of meclizine if ok with MD. Also ok with asking for meds for HA.    If plan is discharge home, recommend the following: A little help with walking and/or transfers;Assistance with cooking/housework;Help with stairs or ramp for entrance   Can travel by private vehicle     No  Equipment Recommendations  Other (comment)    Recommendations for Other Services       Precautions / Restrictions Precautions Precautions: Fall Restrictions Weight Bearing Restrictions: No     Mobility  Bed Mobility                    Transfers Overall transfer level: Needs assistance Equipment used: None Transfers: Sit to/from Stand Sit to Stand: Supervision           General transfer comment: apears weak, slow, off balance    Ambulation/Gait Ambulation/Gait assistance: Contact guard assist Gait Distance (Feet): 120 Feet Assistive device: Straight cane Gait Pattern/deviations: Scissoring, Narrow base of support, Step-to pattern        General Gait Details: more unsteady and slow today due to vertigo flare   Stairs             Wheelchair Mobility     Tilt Bed    Modified Rankin (Stroke Patients Only)       Balance                                            Cognition                                                Exercises Other Exercises Other Exercises: 5xSTS from chair, push off recliner arm rests    General Comments        Pertinent Vitals/Pain Pain Assessment Pain Assessment: 0-10 Pain Score: 5  Pain Location: HA Pain Intervention(s): Limited activity within patient's tolerance, Patient requesting pain meds-RN notified    Home Living                          Prior Function            PT Goals (current goals can now be found in the care plan section) Acute Rehab PT Goals Patient Stated Goal: to return home with mom and sister PT Goal  Formulation: With patient Time For Goal Achievement: 06/17/23 Potential to Achieve Goals: Fair Progress towards PT goals: Not progressing toward goals - comment    Frequency    Min 1X/week      PT Plan      Co-evaluation              AM-PAC PT "6 Clicks" Mobility   Outcome Measure  Help needed turning from your back to your side while in a flat bed without using bedrails?: A Lot Help needed moving from lying on your back to sitting on the side of a flat bed without using bedrails?: A Lot Help needed moving to and from a bed to a chair (including a wheelchair)?: A Little Help needed standing up from a chair using your arms (e.g., wheelchair or bedside chair)?: A Little Help needed to walk in hospital room?: A Little Help needed climbing 3-5 steps with a railing? : A Lot 6 Click Score: 15    End of Session Equipment Utilized During Treatment: Gait belt Activity Tolerance: Patient tolerated treatment well;No increased pain Patient left: in chair;with call bell/phone within  reach;with chair alarm set Nurse Communication: Mobility status PT Visit Diagnosis: Unsteadiness on feet (R26.81);Other abnormalities of gait and mobility (R26.89);Muscle weakness (generalized) (M62.81);History of falling (Z91.81);Difficulty in walking, not elsewhere classified (R26.2);Other symptoms and signs involving the nervous system (R29.898)     Time: 1030-1058 PT Time Calculation (min) (ACUTE ONLY): 28 min  Charges:    $Therapeutic Activity: 8-22 mins $Neuromuscular Re-education: 8-22 mins PT General Charges $$ ACUTE PT VISIT: 1 Visit                    11:31 AM, 06/08/23 Rosamaria Lints, PT, DPT Physical Therapist - [facility]  Outpatient Physical Therapy- Main Campus [phone]     Kenedie Dirocco C 06/08/2023, 11:27 AM

## 2023-06-09 DIAGNOSIS — R262 Difficulty in walking, not elsewhere classified: Secondary | ICD-10-CM | POA: Diagnosis not present

## 2023-06-09 MED ORDER — ZINC SULFATE 220 (50 ZN) MG PO CAPS: 220.0000 mg | ORAL_CAPSULE | Freq: Every day | ORAL | 0 refills | Status: AC

## 2023-06-09 MED ORDER — MECLIZINE HCL 25 MG PO TABS: 25.0000 mg | ORAL_TABLET | Freq: Three times a day (TID) | ORAL | 0 refills | Status: DC | PRN

## 2023-06-09 MED ORDER — ASCORBIC ACID 500 MG PO TABS: 500.0000 mg | ORAL_TABLET | Freq: Every day | ORAL | Status: AC

## 2023-06-09 MED ORDER — FLUTICASONE PROPIONATE 50 MCG/ACT NA SUSP: 2.0000 | Freq: Every day | NASAL | Status: AC

## 2023-06-09 NOTE — Plan of Care (Signed)
Problem: Education: Goal: Knowledge of General Education information will improve Description: Including pain rating scale, medication(s)/side effects and non-pharmacologic comfort measures Outcome: Progressing   Problem: Health Behavior/Discharge Planning: Goal: Ability to manage health-related needs will improve Outcome: Progressing   Problem: Clinical Measurements: Goal: Ability to maintain clinical measurements within normal limits will improve Outcome: Progressing Goal: Will remain free from infection Outcome: Progressing   Problem: Clinical Measurements: Goal: Will remain free from infection Outcome: Progressing

## 2023-06-09 NOTE — Plan of Care (Signed)
IV removed discharge instructions reviewed and patient discharged to home with friend

## 2023-06-09 NOTE — TOC Transition Note (Addendum)
Transition of Care [facility]) - CM/SW Discharge Note   Patient Details  Name: Margaurite Stuller MRN: [id number] Date of Birth: [birth year]  [facility] CM/SW Contact:  Bing Quarry, RN Phone Number: 06/09/2023, 11:41 AM   Clinical Narrative: 9/20: Patient to discharge home with [facility] services via [facility] per prior RN CM notes. Notified [facility] of discharge and checking with provider for any needed DME needs as prior disposition was SNF changed to [facility]. No SDOH need identified at time of discharge. Unit RN reports has RW at home so no new DME needs at discharge.   Gabriel Cirri MSN RN CM  Transitions of Care Department [facility] [phone] Weekends Only      Final next level of care: Home w Home Health Services Barriers to Discharge: Barriers Resolved   Patient Goals and CMS Choice CMS Medicare.gov Compare Post Acute Care list provided to:: Patient Choice offered to / list presented to : Patient  Discharge Placement                         Discharge Plan and Services Additional resources added to the After Visit Summary for                            [facility] Arranged: OT, PT [facility] Agency: [facility] Date [facility] Agency Contacted: 06/08/23 (By prior RN CM on 06/08/23.)   Representative spoke with at [facility] Agency: [facility] per prior RN CM note.  Social Determinants of Health (SDOH) Interventions SDOH Screenings   Food Insecurity: No Food Insecurity (06/02/2023)  Housing: Low Risk  (06/02/2023)  Transportation Needs: No Transportation Needs (06/02/2023)  Utilities: Not At Risk (06/02/2023)  Tobacco Use: Low Risk  (06/02/2023)     Readmission Risk Interventions     No data to display

## 2023-06-09 NOTE — Discharge Summary (Signed)
Physician Discharge Summary   Patient: Michele Brewer MRN: [id number] DOB: 04-Jun-1951  Admit date:     06/02/2023  Discharge date: 06/09/2023  Discharge Physician: Pennie Banter   PCP: Barbette Reichmann, MD   Recommendations at discharge:  {Tip this will not be part of the note when signed- Example include specific recommendations for outpatient follow-up, pending tests to follow-up on. (Optional):26781}  ***  Discharge Diagnoses: Principal Problem:   Ambulatory dysfunction Active Problems:   COVID-19 virus infection   Parkinson disease   Vitamin B12 deficiency   Elevated CK  Resolved Problems:   * No resolved hospital problems. [facility] Course: No notes on file  Assessment and Plan: * Ambulatory dysfunction Patient has a long-term history of ambulatory dysfunction and frequent falls in the setting of Parkinson's disease and vertigo.  Acute worsening in the setting of COVID-19 infection.  Patient did fall and lay on the floor for several hours, so we will check CK.  Low suspicion for central process including CVA.  - PT/OT - CK pending  COVID-19 virus infection Patient presenting with 2-day history of worsening fatigue, cough and congestion with COVID-19 PCR positive. Labs notable for mild leukopenia and elevated AST consistent with COVID. No evidence of SOB, hypoxia or lower respiratory involvement. Given diarrhea with COVID, will hold off on Paxlovid.   - Imodium as needed for diarrhea - Tessalon Perles as needed for cough - S/p 1 L fluids  Vitamin B12 deficiency Patient has a history of previous severe B12 deficiency.  - B12 level pending - Continue home B12 supplementation  Parkinson disease - Continue home regimen      {Tip this will not be part of the note when signed Body mass index is 24.22 kg/m. , ,  (Optional):26781}  {(NOTE) Pain control PDMP Statment (Optional):26782} Consultants: *** Procedures performed: ***  Disposition: {Plan;  Disposition:26390} Diet recommendation:  Discharge Diet Orders (From admission, onward)     Start     Ordered   06/09/23 0000  Diet - low sodium heart healthy        06/09/23 1114           {Diet_Plan:26776} DISCHARGE MEDICATION: Allergies as of 06/09/2023       Reactions   Citrus    rash   Peanut-containing Drug Products    Rash and itch        Medication List     TAKE these medications    acetaminophen 325 MG tablet Commonly known as: TYLENOL Take 2 tablets (650 mg total) by mouth every 6 (six) hours as needed for mild pain, moderate pain, fever or headache (or Fever >/= 101).   ascorbic acid 500 MG tablet Commonly known as: VITAMIN C Take 1 tablet (500 mg total) by mouth daily for 14 days. Start taking on: June 10, 2023   carbidopa-levodopa 25-100 MG tablet Commonly known as: SINEMET IR Take 1.5 tablets by mouth 2 (two) times daily.   carbidopa-levodopa 25-100 MG tablet Commonly known as: SINEMET IR Take 1.5 tablets by mouth at bedtime.   cyanocobalamin 1000 MCG tablet Take 1 tablet (1,000 mcg total) by mouth daily.   DULoxetine 30 MG capsule Commonly known as: CYMBALTA Take 1 capsule (30 mg total) by mouth 2 (two) times daily.   feeding supplement (NEPRO CARB STEADY) Liqd Take 237 mLs by mouth 3 (three) times daily between meals.   fluticasone 50 MCG/ACT nasal spray Commonly known as: FLONASE Place 2 sprays into both nostrils daily. Start taking  on: June 10, 2023   folic acid 1 MG tablet Commonly known as: FOLVITE Take 1 tablet (1 mg total) by mouth daily.   gabapentin 100 MG capsule Commonly known as: NEURONTIN Take 100 mg by mouth 3 (three) times daily.   gabapentin 100 MG capsule Commonly known as: NEURONTIN Take 1 capsule (100 mg total) by mouth 3 (three) times daily.   meclizine 25 MG tablet Commonly known as: ANTIVERT Take 1 tablet (25 mg total) by mouth 3 (three) times daily as needed for dizziness.   multivitamin with  minerals Tabs tablet Take 1 tablet by mouth daily.   polyethylene glycol 17 g packet Commonly known as: MIRALAX / GLYCOLAX Take 17 g by mouth daily as needed for moderate constipation.   thiamine 100 MG tablet Commonly known as: Vitamin B-1 Take 1 tablet (100 mg total) by mouth daily.   Vitamin D (Ergocalciferol) 1.25 MG (50000 UNIT) Caps capsule Commonly known as: DRISDOL Take 1 capsule (50,000 Units total) by mouth every 7 (seven) days.   zinc sulfate 220 (50 Zn) MG capsule Take 1 capsule (220 mg total) by mouth daily for 14 days. Start taking on: June 10, 2023        Contact information for after-discharge care     Destination     [facility] .   Service: Skilled Nursing Contact information: [address] [place] [address] [phone]                    Discharge Exam: Ceasar Mons Weights   06/02/23 0651  Weight: 64 kg   ***  Condition at discharge: {DC Condition:26389}  The results of significant diagnostics from this hospitalization (including imaging, microbiology, ancillary and laboratory) are listed below for reference.   Imaging Studies: CT Head Wo Contrast  Result Date: 06/02/2023 CLINICAL DATA:  Fall from standing with head pain. EXAM: CT HEAD WITHOUT CONTRAST TECHNIQUE: Contiguous axial images were obtained from the base of the skull through the vertex without intravenous contrast. RADIATION DOSE REDUCTION: This exam was performed according to the departmental dose-optimization program which includes automated exposure control, adjustment of the mA and/or kV according to patient size and/or use of iterative reconstruction technique. COMPARISON:  CT head dated 07/14/2022. FINDINGS: Brain: No evidence of acute infarction, hemorrhage, hydrocephalus, extra-axial collection or mass lesion/mass effect. There is mild cerebral volume loss with associated ex vacuo dilatation. Vascular: There are vascular calcifications in  the carotid siphons. Skull: Normal. Negative for fracture or focal lesion. Sinuses/Orbits: There is right maxillary sinus disease and left ethmoid sinus disease. There is a small left mastoid effusion. Other: None. IMPRESSION: 1. No acute intracranial pathology. Electronically Signed   By: Romona Curls M.D.   On: 06/02/2023 10:03   DG Chest 2 View  Result Date: 06/02/2023 CLINICAL DATA:  Cough, fatigue. EXAM: CHEST - 2 VIEW COMPARISON:  Chest radiograph dated 07/14/2022. FINDINGS: The heart size and mediastinal contours are within normal limits. Vascular calcifications are seen in the aortic arch. Both lungs are clear. Degenerative changes are seen in the spine. IMPRESSION: No active cardiopulmonary disease. Electronically Signed   By: Romona Curls M.D.   On: 06/02/2023 09:58    Microbiology: Results for orders placed or performed during the hospital encounter of 06/02/23  Resp Panel by RT-PCR (Flu A&B, Covid) Anterior Nasal Swab     Status: Abnormal   Collection Time: 06/02/23 10:40 AM   Specimen: Anterior Nasal Swab  Result Value Ref Range Status  SARS Coronavirus 2 by RT PCR POSITIVE (A) NEGATIVE Final    Comment: (NOTE) SARS-CoV-2 target nucleic acids are DETECTED.  The SARS-CoV-2 RNA is generally detectable in upper respiratory specimens during the acute phase of infection. Positive results are indicative of the presence of the identified virus, but do not rule out bacterial infection or co-infection with other pathogens not detected by the test. Clinical correlation with patient history and other diagnostic information is necessary to determine patient infection status. The expected result is Negative.  Fact Sheet for Patients: BloggerCourse.com  Fact Sheet for Healthcare Providers: SeriousBroker.it  This test is not yet approved or cleared by the [place] FDA and  has been authorized for detection and/or diagnosis of  SARS-CoV-2 by FDA under an Emergency Use Authorization (EUA).  This EUA will remain in effect (meaning this test can be used) for the duration of  the COVID-19 declaration under Section 564(b)(1) of the A ct, 21 U.S.C. section 360bbb-3(b)(1), unless the authorization is terminated or revoked sooner.     Influenza A by PCR NEGATIVE NEGATIVE Final   Influenza B by PCR NEGATIVE NEGATIVE Final    Comment: (NOTE) The Xpert Xpress SARS-CoV-2/FLU/RSV plus assay is intended as an aid in the diagnosis of influenza from Nasopharyngeal swab specimens and should not be used as a sole basis for treatment. Nasal washings and aspirates are unacceptable for Xpert Xpress SARS-CoV-2/FLU/RSV testing.  Fact Sheet for Patients: BloggerCourse.com  Fact Sheet for Healthcare Providers: SeriousBroker.it  This test is not yet approved or cleared by the [place] FDA and has been authorized for detection and/or diagnosis of SARS-CoV-2 by FDA under an Emergency Use Authorization (EUA). This EUA will remain in effect (meaning this test can be used) for the duration of the COVID-19 declaration under Section 564(b)(1) of the Act, 21 U.S.C. section 360bbb-3(b)(1), unless the authorization is terminated or revoked.  Performed at [facility], [address] [place], [place] [address]   Urine Culture     Status: Abnormal   Collection Time: 06/06/23  3:00 PM   Specimen: Urine, Random  Result Value Ref Range Status   Specimen Description   Final    URINE, RANDOM Performed at [facility], [address], [place], [place] [address]    Special Requests   Final    NONE Performed at [facility], [address] [place], [place] [address]    Culture (A)  Final    <10,000 COLONIES/mL INSIGNIFICANT GROWTH Performed at [facility], [address] [place], [place] [address]    Report Status 06/07/2023 FINAL  Final     Labs: CBC: Recent Labs  Lab 06/04/23 0442 06/05/23 0723 06/06/23 0633  WBC 2.8* 3.5* 3.3*  HGB 10.7* 11.4* 12.2  HCT 29.7* 33.4* 36.3  MCV 95.2 90.3 90.5  PLT 143* 151 156   Basic Metabolic Panel: Recent Labs  Lab 06/03/23 0554 06/04/23 0442 06/05/23 0723 06/06/23 0633 06/08/23 0638  NA 137 138 140 140 139  K 3.7 3.4* 3.9 4.0 4.2  CL 103 104 104 102 100  CO2 25 27 29 31 30   GLUCOSE 78 93 85 79 85  BUN 20 15 10 12 17   CREATININE 0.60 0.61 0.67 0.76 0.79  CALCIUM 8.5* 8.5* 8.5* 9.0 9.0  MG 2.0  --  1.8 1.9  --    Liver Function Tests: Recent Labs  Lab 06/04/23 0442 06/05/23 0723 06/06/23 0633  AST 52* 41 40  ALT 7 12 11  ALKPHOS 67 73 87  BILITOT 0.6 0.4 0.8  PROT 5.7* 5.9* 7.0  ALBUMIN 2.9* 2.9* 3.4*   CBG: Recent Labs  Lab 06/04/23 0738  GLUCAP 68*    Discharge time spent: {LESS THAN/GREATER THAN:26388} 30 minutes.  Signed: Pennie Banter, DO [facility] 06/09/2023

## 2023-06-11 NOTE — Consult Note (Signed)
[facility] Customer service manager [facility]) Accountable Care Organization (ACO) [facility] Liaison Note  06/11/2023  [name] [birth year] [id number]  Location: [facility] Hospital Liaison screened the patient remotely at [facility]  Insurance: [employer]   Michele Brewer is a 72 y.o. female who is a Primary Care Patient of Barbette Reichmann, MD [facility]). The patient was screened for readmission hospitalization with noted low risk score for unplanned readmission risk with 1 IP in 6 months.  The patient was assessed for potential [facility]) Care Management service needs for post hospital transition for care coordination. Review of patient's electronic medical record reveals patient was admitted with COVID-1. WIll discharged with [facility]. No additional needs anticipated at this time,  Plan: Pt will discharged with [facility] for ongoing therapy with arranged DME.   [facility] Care Management/Population Health does not replace or interfere with any arrangements made by the Inpatient Transition of Care team.   For questions contact:   Elliot Cousin, RN, [facility] Liaison [place]   Population Health Office Hours MTWF  8:00 am-6:00 pm [phone] mobile [phone] [Office toll free line] Office Hours are M-F 8:30 - 5 pm [email or web address]

## 2023-06-12 ENCOUNTER — Encounter: Payer: Self-pay | Admitting: Internal Medicine

## 2023-06-12 DIAGNOSIS — R2689 Other abnormalities of gait and mobility: Secondary | ICD-10-CM | POA: Diagnosis not present

## 2023-06-12 DIAGNOSIS — E568 Deficiency of other vitamins: Secondary | ICD-10-CM | POA: Diagnosis not present

## 2023-06-12 DIAGNOSIS — R748 Abnormal levels of other serum enzymes: Secondary | ICD-10-CM | POA: Diagnosis not present

## 2023-06-12 DIAGNOSIS — M8580 Other specified disorders of bone density and structure, unspecified site: Secondary | ICD-10-CM | POA: Diagnosis not present

## 2023-06-12 DIAGNOSIS — G20A1 Parkinson's disease without dyskinesia, without mention of fluctuations: Secondary | ICD-10-CM | POA: Diagnosis not present

## 2023-06-12 DIAGNOSIS — F32A Depression, unspecified: Secondary | ICD-10-CM | POA: Diagnosis not present

## 2023-06-12 DIAGNOSIS — U071 COVID-19: Secondary | ICD-10-CM | POA: Diagnosis not present

## 2023-06-12 DIAGNOSIS — R296 Repeated falls: Secondary | ICD-10-CM | POA: Diagnosis not present

## 2023-06-12 DIAGNOSIS — D519 Vitamin B12 deficiency anemia, unspecified: Secondary | ICD-10-CM | POA: Diagnosis not present

## 2023-06-12 DIAGNOSIS — E46 Unspecified protein-calorie malnutrition: Secondary | ICD-10-CM | POA: Diagnosis not present

## 2023-06-27 DIAGNOSIS — R634 Abnormal weight loss: Secondary | ICD-10-CM | POA: Diagnosis not present

## 2023-06-27 DIAGNOSIS — Z09 Encounter for follow-up examination after completed treatment for conditions other than malignant neoplasm: Secondary | ICD-10-CM | POA: Diagnosis not present

## 2023-06-27 DIAGNOSIS — G20A1 Parkinson's disease without dyskinesia, without mention of fluctuations: Secondary | ICD-10-CM | POA: Diagnosis not present

## 2023-06-27 DIAGNOSIS — F3340 Major depressive disorder, recurrent, in remission, unspecified: Secondary | ICD-10-CM | POA: Diagnosis not present

## 2023-06-27 DIAGNOSIS — R7989 Other specified abnormal findings of blood chemistry: Secondary | ICD-10-CM | POA: Diagnosis not present

## 2023-06-27 DIAGNOSIS — R262 Difficulty in walking, not elsewhere classified: Secondary | ICD-10-CM | POA: Diagnosis not present

## 2023-06-27 DIAGNOSIS — R296 Repeated falls: Secondary | ICD-10-CM | POA: Diagnosis not present

## 2023-06-27 DIAGNOSIS — E8809 Other disorders of plasma-protein metabolism, not elsewhere classified: Secondary | ICD-10-CM | POA: Diagnosis not present

## 2023-06-27 DIAGNOSIS — Z23 Encounter for immunization: Secondary | ICD-10-CM | POA: Diagnosis not present

## 2023-06-27 DIAGNOSIS — R413 Other amnesia: Secondary | ICD-10-CM | POA: Diagnosis not present

## 2023-06-27 DIAGNOSIS — Z0000 Encounter for general adult medical examination without abnormal findings: Secondary | ICD-10-CM | POA: Diagnosis not present

## 2023-06-27 NOTE — Progress Notes (Signed)
 Chief Complaint  Patient presents with  . Hospital Follow Up    HPI  Darrian Goodwill is a 72 y.o. here for a  Hospital Follow up and a Medicare Wellness visit  Moved from [place] approx 2 yrs back  Hx of Parkinson's disease, Seasonal Allergies and Depression. Was admitted to [facility] with recurrent falls and elevated CPK and COVID infection. Discharged on 06/09/23  Improved with IV fluids  Has lost weight States her appetite is good  Denies chest pains or shortness of breath Has some ankle edema - worse in hot weather  Walks in the mornings Non Smoker. No alcohol . Sleeps well. Widowed- Has 2 adult children. Mother and sister live with her  Hgb: 12.2, Sugar: 85, Se creat: 0.79, A1c; 5.6 Total Cholesterol: 175, Triglycerides;47 TSH: 0.269 and low B12; 168     ROS Rest of 10 point review of systems is normal.  Outpatient Encounter Medications as of 06/27/2023  Medication Sig Dispense Refill  . calcium carbonate (CALCIUM 600 ORAL) Take 2 capsules by mouth once daily    . cyanocobalamin  (VITAMIN B12) 1,000 mcg/mL injection Inject into the muscle monthly    . cyanocobalamin  (VITAMIN B12) 1000 MCG tablet Take 1,000 mcg by mouth once daily    . cyanocobalamin , vitamin B-12, (VITAMIN B-12 ORAL) Take 1 tablet by mouth every morning before breakfast    . DULoxetine  (CYMBALTA ) 30 MG DR capsule Take 1 capsule (30 mg total) by mouth 2 (two) times daily 180 capsule 3  . ergocalciferol , vitamin D2, 1,250 mcg (50,000 unit) capsule Take 1 capsule by mouth once a week 12 capsule 0  . fluticasone  propionate (FLONASE ) 50 mcg/actuation nasal spray Place 2 sprays into one nostril once daily    . folic acid  (FOLVITE ) 1 MG tablet Take 1 tablet (1 mg total) by mouth once daily for 180 days 90 tablet 1  . gabapentin  (NEURONTIN ) 100 MG capsule Take 1 capsule (100 mg total) by mouth 3 (three) times daily for 180 days 270 capsule 1  . meclizine  (ANTIVERT ) 25 mg tablet Take 25 mg by mouth 3 (three) times  daily as needed for Dizziness    . multivitamin capsule Take 1 capsule by mouth every other day    . olive oil external oil Take 1 capsule by mouth once daily    . olive oil Oil Take 1 capsule by mouth once daily    . polyethylene glycol (MIRALAX ) packet Take 17 g by mouth once daily    . thiamine  (VITAMIN B-1) 100 MG tablet Take 100 mg by mouth once daily    . carbidopa -levodopa  (SINEMET ) 25-100 mg tablet Take 1.5 tablets by mouth 3 (three) times daily (Patient not taking: Reported on 06/27/2023) 270 tablet 3   No facility-administered encounter medications on file as of 06/27/2023.    Allergies as of 06/27/2023  . (No Known Allergies)    Past Medical History:  Diagnosis Date  . Depression   . PD (Parkinson's disease) (CMS/HHS-HCC)   . Vertigo     Past Surgical History:  Procedure Laterality Date  . CESAREAN DELIVERY      Vitals:   06/27/23 1320  BP: 112/78  Pulse: 77   Body mass index is 22.01 kg/m.  No visits with results within 3 Month(s) from this visit.  Latest known visit with results is:  Office Visit on 11/22/2022  Component Date Value Ref Range Status  . WBC (White Blood Cell Count) 11/22/2022 3.1 (L)  4.1 - 10.2 10^3/uL Final  .  RBC (Red Blood Cell Count) 11/22/2022 4.25  4.04 - 5.48 10^6/uL Final  . Hemoglobin 11/22/2022 13.5  12.0 - 15.0 gm/dL Final  . Hematocrit 96/93/7975 39.6  35.0 - 47.0 % Final  . MCV (Mean Corpuscular Volume) 11/22/2022 93.2  80.0 - 100.0 fl Final  . MCH (Mean Corpuscular Hemoglobin) 11/22/2022 31.8 (H)  27.0 - 31.2 pg Final  . MCHC (Mean Corpuscular Hemoglobin * 11/22/2022 34.1  32.0 - 36.0 gm/dL Final  . Platelet Count 11/22/2022 176  150 - 450 10^3/uL Final  . RDW-CV (Red Cell Distribution Widt* 11/22/2022 13.5  11.6 - 14.8 % Final  . MPV (Mean Platelet Volume) 11/22/2022 11.2  9.4 - 12.4 fl Final  . Neutrophils 11/22/2022 1.54  1.50 - 7.80 10^3/uL Final  . Lymphocytes 11/22/2022 1.06  1.00 - 3.60 10^3/uL Final  . Monocytes  11/22/2022 0.38  0.00 - 1.50 10^3/uL Final  . Eosinophils 11/22/2022 0.08  0.00 - 0.55 10^3/uL Final  . Basophils 11/22/2022 0.03  0.00 - 0.09 10^3/uL Final  . Neutrophil % 11/22/2022 49.6  32.0 - 70.0 % Final  . Lymphocyte % 11/22/2022 34.2  10.0 - 50.0 % Final  . Monocyte % 11/22/2022 12.3  4.0 - 13.0 % Final  . Eosinophil % 11/22/2022 2.6  1.0 - 5.0 % Final  . Basophil% 11/22/2022 1.0  0.0 - 2.0 % Final  . Immature Granulocyte % 11/22/2022 0.3  <=0.7 % Final  . Immature Granulocyte Count 11/22/2022 0.01  <=0.06 10^3/L Final  . Glucose 11/22/2022 89  70 - 110 mg/dL Final  . Sodium 96/93/7975 142  136 - 145 mmol/L Final  . Potassium 11/22/2022 4.1  3.6 - 5.1 mmol/L Final  . Chloride 11/22/2022 103  97 - 109 mmol/L Final  . Carbon Dioxide (CO2) 11/22/2022 30.9  22.0 - 32.0 mmol/L Final  . Urea Nitrogen (BUN) 11/22/2022 22  7 - 25 mg/dL Final  . Creatinine 96/93/7975 0.7  0.6 - 1.1 mg/dL Final  . Glomerular Filtration Rate (eGFR) 11/22/2022 92  >60 mL/min/1.73sq m Final   CKD-EPI (2021) does not include patient's race in the calculation of eGFR.  Monitoring changes of plasma creatinine and eGFR over time is useful for monitoring kidney function.   Interpretive Ranges for eGFR (CKD-EPI 2021):  eGFR:       >60 mL/min/1.73 sq. m - Normal eGFR:       30-59 mL/min/1.73 sq. m - Moderately Decreased eGFR:       15-29 mL/min/1.73 sq. m  - Severely Decreased eGFR:       < 15 mL/min/1.73 sq. m  - Kidney Failure    Note: These eGFR calculations do not apply in acute situations when eGFR is changing rapidly or patients on dialysis.  . Calcium 11/22/2022 9.7  8.7 - 10.3 mg/dL Final  . AST  96/93/7975 24  8 - 39 U/L Final  . ALT  11/22/2022 17  5 - 38 U/L Final  . Alk Phos (alkaline Phosphatase) 11/22/2022 153 (H)  34 - 104 U/L Final  . Albumin 11/22/2022 4.2  3.5 - 4.8 g/dL Final  . Bilirubin, Total 11/22/2022 0.7  0.3 - 1.2 mg/dL Final  . Protein, Total 11/22/2022 7.5  6.1 - 7.9 g/dL Final  .  A/G Ratio 96/93/7975 1.3  1.0 - 5.0 gm/dL Final  . Hemoglobin J8R 11/22/2022 5.3  4.2 - 5.6 % Final  . Average Blood Glucose (Calc) 11/22/2022 105  mg/dL Final  . Cholesterol, Total 11/22/2022 200  100 -  200 mg/dL Final  . Triglyceride 96/93/7975 64  35 - 199 mg/dL Final  . HDL (High Density Lipoprotein) Cho* 11/22/2022 72.2  35.0 - 85.0 mg/dL Final  . LDL Calculated 11/22/2022 884  0 - 130 mg/dL Final  . VLDL Cholesterol 11/22/2022 13  mg/dL Final  . Cholesterol/HDL Ratio 11/22/2022 2.8   Final  . Creatinine, Random Urine 11/22/2022 173.6  37.0 - 250.0 mg/dL Final  . Urine Albumin, Random 11/22/2022 34    mg/L Final  . Urine Albumin/Creatinine Ratio 11/22/2022 19.6  <30.0 ug/mg Final   Urine:         Spot collection              (g/mg creatinine)     Normal               < 30   Moderately          30-299         increased   Clinical             >=300 albuminuria  . Thyroid  Stimulating Hormone (TSH) 11/22/2022 0.621  0.450-5.330 uIU/ml uIU/mL Final   Reference Range for Pregnant Females >= 18 yrs old: Normal Range for 1st trimester: 0.05-3.70 ulU/ml Normal Range for 2nd trimester: 0.31-4.35 ulU/ml  . Color 11/22/2022 Yellow  Colorless, Straw, Light Yellow, Yellow, Dark Yellow Final  . Clarity 11/22/2022 Clear  Clear Final  . Specific Gravity 11/22/2022 1.030  1.005 - 1.030 Final  . pH, Urine 11/22/2022 6.0  5.0 - 8.0 Final  . Protein, Urinalysis 11/22/2022 Trace (!)  Negative mg/dL Final  . Glucose, Urinalysis 11/22/2022 Negative  Negative mg/dL Final  . Ketones, Urinalysis 11/22/2022 Negative  Negative mg/dL Final  . Blood, Urinalysis 11/22/2022 Trace (!)  Negative Final  . Nitrite, Urinalysis 11/22/2022 Negative  Negative Final  . Leukocyte Esterase, Urinalysis 11/22/2022 Trace (!)  Negative Final  . Bilirubin, Urinalysis 11/22/2022 Negative  Negative Final  . Urobilinogen, Urinalysis 11/22/2022 2.0 (H)  0.2 - 1.0 mg/dL Final  . Mucous, Urine 11/22/2022 PRESENT (!)  None  Seen Final  . WBC, UA 11/22/2022 4  <=5 /hpf Final  . Red Blood Cells, Urinalysis 11/22/2022 5 (H)  <=3 /hpf Final  . Bacteria, Urinalysis 11/22/2022 0-5  0 - 5 /hpf Final  . Squamous Epithelial Cells, Urinaly* 11/22/2022 0  /hpf Final    Exam Blood pressure 112/78, pulse 77, height 162.6 cm (5' 4), weight 58.2 kg (128 lb 3.2 oz), SpO2 99%.  Wt Readings from Last 3 Encounters:  06/27/23 58.2 kg (128 lb 3.2 oz)  11/22/22 65 kg (143 lb 6.4 oz)  01/13/22 65.6 kg (144 lb 9.6 oz)   General: Alert oriented x3  Skin: No suspicious lesions or moles. Area of roughness of skin Rt elbow    Eyes: Sclera and conjunctiva clear; pupils equal round and reactive to light  extraocular movements intact Ears:Bilateral cerumen impaction noted  Nose: Mucosa healthy without drainage or ulceration Oropharynx: No suspicious lesions Neck: No swelling, masses, stiffness, pain, limited movement, carotid pulses normal bilaterally, thyroid  normal size, no masses palpated. No bruits heard. Lungs: Respirations unlabored; clear to auscultation bilaterally Back: No spinal deformity Cardiovascular: Heart regular rate and rhythm without murmurs, gallops, or rubs Abdomen: Soft; non tender; non distended;  no masses or organomegaly Lymph Nodes: No significant cervical or supraclavicular lymphadenopathy noted Musculoskeletal: no active joint inflammation Extremities: Normal, 1 + edema Concavity and ridging of nails of thumb noted  Neurologic: Alert and  oriented; speech intact; face symmetrical; moves all extremities well    Tremor and parkinsonian facies noted   Assessment and Plan: 1 Hospital Follow up following recent episode of falls and elevate CPK:  Doing better  Received IV fluids  2  Parkinson's disease:On Sinemet  and Gabapentin   Follow up with Dr. 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 here for Medicare Wellness Visit  MEDICARE WELLNESS VISIT  Providers Rendering Care 1. Dr. Tamra Leventhal (PCP)  Functional Assessment (1) Hearing: Demonstrates no difficulty in hearing during normal conversation (2) Risk of Falls: Patient fell in Sept 2024  Gait is unsteady without assistance during walk from waiting area to exam room  (3) Home Safety: Patient feels secure in their home, There are operational smoke alarms in multiple areas of the home (4) Activities of Daily Living: Independently manages personal grooming and household chores, including cooking, cleaning and laundry. Manages Personal finances without assistance.  Depression Screening PHQ 2/9 last 3 flowsheet values     03/05/2021    1:15 AM 01/13/2022    5:06 PM 06/27/2023    1:30 PM  PHQ-2/9 Depression Screening   Little interest or pleasure in doing things   0  Feeling down, depressed, or hopeless   0  Patient Health Questionnaire-2 Score   0  (OBSOLETE) Little interest or pleasure in doing things 1 1   (OBSOLETE) Total Prescreening Score 1 1   (OBSOLETE) Total Score = 1 1      Depression Severity and Treatment Recommendations:  0-4= None  5-9= Mild / Treatment: Support, educate to call if worse; return in one month  10-14= Moderate / Treatment: Support, watchful waiting; Antidepressant or Psychotherapy  15-19= Moderately severe / Treatment: Antidepressant OR Psychotherapy  >= 20 = Major depression, severe /  Antidepressant AND Psychotherapy On medications for Depression   Cognitive Impairment Patient has  episodes of loosing things, being forgetful. Seems oriented to person, place and time.  Responses appear appropriate and timely to this observer.  PREVENTION PLAN  Cardiovascular: FLP assessed; 11/22/22; LDL;115 Diabetes: A1c or FBG assessed; 3.6/24  A1c; 5.3  Glaucoma: N/A Hepatitis B (HBV) Vaccine:  Not Applicable Smoking Cessation:  Not Applicable  Other Personalized Health Advice  Encouraged patient to exercise 5 days a week, walking, water aerobics, gentle stretching recommended. Increase dietary intake of fresh fruits and vegetables, reduce red meat to twice a week.  End of Life Counseling Patient has a living will in place; Has designated her friend Curtistine Molt as her POA - ; Pt is DNR   Current Outpatient Medications  Medication Sig Dispense Refill  . calcium carbonate (CALCIUM 600 ORAL) Take 2 capsules by mouth once daily    . cyanocobalamin  (VITAMIN B12) 1,000 mcg/mL injection Inject into the  muscle monthly    . cyanocobalamin  (VITAMIN B12) 1000 MCG tablet Take 1,000 mcg by mouth once daily    . cyanocobalamin , vitamin B-12, (VITAMIN B-12 ORAL) Take 1 tablet by mouth every morning before breakfast    . DULoxetine  (CYMBALTA ) 30 MG DR capsule Take 1 capsule (30 mg total) by mouth 2 (two) times daily 180 capsule 3  . ergocalciferol , vitamin D2, 1,250 mcg (50,000 unit) capsule Take 1 capsule by mouth once a week 12 capsule 0  . fluticasone  propionate (FLONASE ) 50 mcg/actuation nasal spray Place 2 sprays into one nostril once daily    . folic acid  (FOLVITE ) 1 MG tablet Take 1 tablet (1 mg total) by mouth once daily for 180 days 90 tablet 1  . gabapentin  (NEURONTIN ) 100 MG capsule Take 1 capsule (100 mg total) by mouth 3 (three) times daily for 180 days 270 capsule 1  . meclizine  (ANTIVERT ) 25 mg tablet Take 25 mg by mouth 3 (three) times daily as needed for Dizziness    .  multivitamin capsule Take 1 capsule by mouth every other day    . olive oil external oil Take 1 capsule by mouth once daily    . olive oil Oil Take 1 capsule by mouth once daily    . polyethylene glycol (MIRALAX ) packet Take 17 g by mouth once daily    . thiamine  (VITAMIN B-1) 100 MG tablet Take 100 mg by mouth once daily    . carbidopa -levodopa  (SINEMET ) 25-100 mg tablet Take 1.5 tablets by mouth 3 (three) times daily (Patient not taking: Reported on 06/27/2023) 270 tablet 3   No current facility-administered medications for this visit.    Allergies as of 06/27/2023  . (No Known Allergies)    Patient Active Problem List  Diagnosis  . PD (Parkinson's disease) (CMS/HHS-HCC)  . Parkinson's disease (CMS/HHS-HCC)  . Major depressive disorder, recurrent, in remission (CMS-HCC)  . Osteopenia  . Frequent falls  . Fall  . Rhabdomyolysis  . Ambulatory dysfunction  . Vitamin B12 deficiency  . Folate deficiency    Past Medical History:  Diagnosis Date  . Depression   . PD (Parkinson's disease) (CMS/HHS-HCC)   . Vertigo     Past Surgical History:  Procedure Laterality Date  . CESAREAN DELIVERY      Health Maintenance  Topic Date Due  . Colorectal Cancer Screening  Never done  . RSV Immunization Pregnant or 60+ (1 - 1-dose 60+ series) Never done  . Shingrix (2 of 2) 09/30/2019  . Medicare Initial or AWV  01/14/2023  . COVID-19 Vaccine (5 - 2024-25 season) 05/20/2023  . Influenza Vaccine (1) 05/20/2023  . Depression Screening  06/26/2024  . DXA Bone Density Scan  12/22/2024  . Lipid Panel  11/22/2027  . Adult Tetanus (Td And Tdap)  06/05/2029  . Pneumococcal Vaccine: 65+  Completed  . Hepatitis C Screen  Completed  . Hib Vaccines  Aged Out  . Hepatitis A Vaccines  Aged Out  . Meningococcal ACWY Vaccine  Aged Out  . HPV Vaccines  Aged Out  . Mammogram  Discontinued    Vitals:   06/27/23 1320  BP: 112/78  Pulse: 77  SpO2: 99%  Weight: 58.2 kg (128 lb 3.2 oz)  Height:  162.6 cm (5' 4)  PainSc: 0-No pain   Body mass index is 22.01 kg/m.  Assessment/Plan  1. Medicare wellness visit- Medications and allergies reviewed. Copy of preventative health provided.  Labs reviewed.   TAMRA ETHELENE LEVENTHAL, MD

## 2023-06-28 DIAGNOSIS — F32A Depression, unspecified: Secondary | ICD-10-CM | POA: Diagnosis not present

## 2023-06-28 DIAGNOSIS — R296 Repeated falls: Secondary | ICD-10-CM | POA: Diagnosis not present

## 2023-06-28 DIAGNOSIS — D519 Vitamin B12 deficiency anemia, unspecified: Secondary | ICD-10-CM | POA: Diagnosis not present

## 2023-06-28 DIAGNOSIS — R2689 Other abnormalities of gait and mobility: Secondary | ICD-10-CM | POA: Diagnosis not present

## 2023-06-28 DIAGNOSIS — E46 Unspecified protein-calorie malnutrition: Secondary | ICD-10-CM | POA: Diagnosis not present

## 2023-06-28 DIAGNOSIS — G20A1 Parkinson's disease without dyskinesia, without mention of fluctuations: Secondary | ICD-10-CM | POA: Diagnosis not present

## 2023-06-28 DIAGNOSIS — U071 COVID-19: Secondary | ICD-10-CM | POA: Diagnosis not present

## 2023-12-21 ENCOUNTER — Other Ambulatory Visit (HOSPITAL_COMMUNITY): Payer: Self-pay

## 2023-12-24 NOTE — Progress Notes (Signed)
 Chief Complaint  Patient presents with  . Follow-up    HPI  Michele Brewer is a 73 y.o. here for a Follow up  Moved from [place] approx 2 yrs back  Hx of Parkinson's disease, Seasonal Allergies and Depression. Has been feeling well Continues to experience tremors and unsteadiness from Parkinson's disease Denies chest pains or shortness of breath Has some ankle edema - worse in hot weather  Walks in the mornings Non Smoker. No alcohol . Sleeps well. Has gained weight Widowed- Has 2 adult children. Mother and sister live with her  Hgb: 12.2, Sugar: 79, Se creat: 0.76, A1c; 5.6 Total Cholesterol: 175, Triglycerides;47 TSH: 0.621     ROS Rest of 10 point review of systems is normal.  Outpatient Encounter Medications as of 12/24/2023  Medication Sig Dispense Refill  . calcium carbonate (CALCIUM 600 ORAL) Take 2 capsules by mouth once daily    . cyanocobalamin  (VITAMIN B12) 1,000 mcg/mL injection Inject into the muscle monthly    . cyanocobalamin  (VITAMIN B12) 1000 MCG tablet Take 1,000 mcg by mouth once daily    . cyanocobalamin , vitamin B-12, (VITAMIN B-12 ORAL) Take 1 tablet by mouth every morning before breakfast    . DULoxetine  (CYMBALTA ) 30 MG DR capsule Take 1 capsule (30 mg total) by mouth 2 (two) times daily 180 capsule 3  . ergocalciferol , vitamin D2, 1,250 mcg (50,000 unit) capsule Take 1 capsule by mouth once a week 12 capsule 0  . fluticasone  propionate (FLONASE ) 50 mcg/actuation nasal spray Place 2 sprays into one nostril once daily    . meclizine  (ANTIVERT ) 25 mg tablet Take 25 mg by mouth 3 (three) times daily as needed for Dizziness    . multivitamin capsule Take 1 capsule by mouth every other day    . olive oil external oil Take 1 capsule by mouth once daily    . olive oil Oil Take 1 capsule by mouth once daily    . polyethylene glycol (MIRALAX ) packet Take 17 g by mouth once daily    . thiamine  (VITAMIN B-1) 100 MG tablet Take 100 mg by mouth once daily    .  carbidopa -levodopa  (SINEMET ) 25-100 mg tablet Take 1.5 tablets by mouth 3 (three) times daily (Patient not taking: Reported on 12/24/2023) 270 tablet 3  . folic acid  (FOLVITE ) 1 MG tablet Take 1 tablet (1 mg total) by mouth once daily for 180 days 90 tablet 1  . gabapentin  (NEURONTIN ) 100 MG capsule Take 1 capsule (100 mg total) by mouth 3 (three) times daily for 180 days 270 capsule 1   No facility-administered encounter medications on file as of 12/24/2023.    Allergies as of 12/24/2023  . (No Known Allergies)    Past Medical History:  Diagnosis Date  . Depression   . PD (Parkinson's disease) (CMS/HHS-HCC)   . Vertigo     Past Surgical History:  Procedure Laterality Date  . CESAREAN DELIVERY      Blood pressure 110/68, pulse 74, height 162.6 cm (5' 4), weight 63.6 kg (140 lb 3.2 oz), SpO2 99%.   Body mass index is 24.07 kg/m. Wt Readings from Last 3 Encounters:  12/24/23 63.6 kg (140 lb 3.2 oz)  06/27/23 58.2 kg (128 lb 3.2 oz)  11/22/22 65 kg (143 lb 6.4 oz)     No visits with results within 3 Month(s) from this visit.  Latest known visit with results is:  Office Visit on 11/22/2022  Component Date Value Ref Range Status  . WBC (White  Blood Cell Count) 11/22/2022 3.1 (L)  4.1 - 10.2 10^3/uL Final  . RBC (Red Blood Cell Count) 11/22/2022 4.25  4.04 - 5.48 10^6/uL Final  . Hemoglobin 11/22/2022 13.5  12.0 - 15.0 gm/dL Final  . Hematocrit 96/93/7975 39.6  35.0 - 47.0 % Final  . MCV (Mean Corpuscular Volume) 11/22/2022 93.2  80.0 - 100.0 fl Final  . MCH (Mean Corpuscular Hemoglobin) 11/22/2022 31.8 (H)  27.0 - 31.2 pg Final  . MCHC (Mean Corpuscular Hemoglobin * 11/22/2022 34.1  32.0 - 36.0 gm/dL Final  . Platelet Count 11/22/2022 176  150 - 450 10^3/uL Final  . RDW-CV (Red Cell Distribution Widt* 11/22/2022 13.5  11.6 - 14.8 % Final  . MPV (Mean Platelet Volume) 11/22/2022 11.2  9.4 - 12.4 fl Final  . Neutrophils 11/22/2022 1.54  1.50 - 7.80 10^3/uL Final  . Lymphocytes  11/22/2022 1.06  1.00 - 3.60 10^3/uL Final  . Monocytes 11/22/2022 0.38  0.00 - 1.50 10^3/uL Final  . Eosinophils 11/22/2022 0.08  0.00 - 0.55 10^3/uL Final  . Basophils 11/22/2022 0.03  0.00 - 0.09 10^3/uL Final  . Neutrophil % 11/22/2022 49.6  32.0 - 70.0 % Final  . Lymphocyte % 11/22/2022 34.2  10.0 - 50.0 % Final  . Monocyte % 11/22/2022 12.3  4.0 - 13.0 % Final  . Eosinophil % 11/22/2022 2.6  1.0 - 5.0 % Final  . Basophil% 11/22/2022 1.0  0.0 - 2.0 % Final  . Immature Granulocyte % 11/22/2022 0.3  <=0.7 % Final  . Immature Granulocyte Count 11/22/2022 0.01  <=0.06 10^3/L Final  . Glucose 11/22/2022 89  70 - 110 mg/dL Final  . Sodium 96/93/7975 142  136 - 145 mmol/L Final  . Potassium 11/22/2022 4.1  3.6 - 5.1 mmol/L Final  . Chloride 11/22/2022 103  97 - 109 mmol/L Final  . Carbon Dioxide (CO2) 11/22/2022 30.9  22.0 - 32.0 mmol/L Final  . Urea Nitrogen (BUN) 11/22/2022 22  7 - 25 mg/dL Final  . Creatinine 96/93/7975 0.7  0.6 - 1.1 mg/dL Final  . Glomerular Filtration Rate (eGFR) 11/22/2022 92  >60 mL/min/1.73sq m Final   CKD-EPI (2021) does not include patient's race in the calculation of eGFR.  Monitoring changes of plasma creatinine and eGFR over time is useful for monitoring kidney function.   Interpretive Ranges for eGFR (CKD-EPI 2021):  eGFR:       >60 mL/min/1.73 sq. m - Normal eGFR:       30-59 mL/min/1.73 sq. m - Moderately Decreased eGFR:       15-29 mL/min/1.73 sq. m  - Severely Decreased eGFR:       < 15 mL/min/1.73 sq. m  - Kidney Failure    Note: These eGFR calculations do not apply in acute situations when eGFR is changing rapidly or patients on dialysis.  . Calcium 11/22/2022 9.7  8.7 - 10.3 mg/dL Final  . AST  96/93/7975 24  8 - 39 U/L Final  . ALT  11/22/2022 17  5 - 38 U/L Final  . Alk Phos (alkaline Phosphatase) 11/22/2022 153 (H)  34 - 104 U/L Final  . Albumin 11/22/2022 4.2  3.5 - 4.8 g/dL Final  . Bilirubin, Total 11/22/2022 0.7  0.3 - 1.2 mg/dL Final   . Protein, Total 11/22/2022 7.5  6.1 - 7.9 g/dL Final  . A/G Ratio 96/93/7975 1.3  1.0 - 5.0 gm/dL Final  . Hemoglobin J8R 11/22/2022 5.3  4.2 - 5.6 % Final  . Average Blood Glucose (Calc)  11/22/2022 105  mg/dL Final  . Cholesterol, Total 11/22/2022 200  100 - 200 mg/dL Final  . Triglyceride 96/93/7975 64  35 - 199 mg/dL Final  . HDL (High Density Lipoprotein) Cho* 11/22/2022 72.2  35.0 - 85.0 mg/dL Final  . LDL Calculated 11/22/2022 884  0 - 130 mg/dL Final  . VLDL Cholesterol 11/22/2022 13  mg/dL Final  . Cholesterol/HDL Ratio 11/22/2022 2.8   Final  . Creatinine, Random Urine 11/22/2022 173.6  37.0 - 250.0 mg/dL Final  . Urine Albumin, Random 11/22/2022 34    mg/L Final  . Urine Albumin/Creatinine Ratio 11/22/2022 19.6  <30.0 ug/mg Final   Urine:         Spot collection              (g/mg creatinine)     Normal               < 30   Moderately          30-299         increased   Clinical             >=300 albuminuria  . Thyroid  Stimulating Hormone (TSH) 11/22/2022 0.621  0.450-5.330 uIU/ml uIU/mL Final   Reference Range for Pregnant Females >= 18 yrs old: Normal Range for 1st trimester: 0.05-3.70 ulU/ml Normal Range for 2nd trimester: 0.31-4.35 ulU/ml  . Color 11/22/2022 Yellow  Colorless, Straw, Light Yellow, Yellow, Dark Yellow Final  . Clarity 11/22/2022 Clear  Clear Final  . Specific Gravity 11/22/2022 1.030  1.005 - 1.030 Final  . pH, Urine 11/22/2022 6.0  5.0 - 8.0 Final  . Protein, Urinalysis 11/22/2022 Trace (!)  Negative mg/dL Final  . Glucose, Urinalysis 11/22/2022 Negative  Negative mg/dL Final  . Ketones, Urinalysis 11/22/2022 Negative  Negative mg/dL Final  . Blood, Urinalysis 11/22/2022 Trace (!)  Negative Final  . Nitrite, Urinalysis 11/22/2022 Negative  Negative Final  . Leukocyte Esterase, Urinalysis 11/22/2022 Trace (!)  Negative Final  . Bilirubin, Urinalysis 11/22/2022 Negative  Negative Final  . Urobilinogen, Urinalysis 11/22/2022 2.0 (H)  0.2 - 1.0  mg/dL Final  . Mucous, Urine 11/22/2022 PRESENT (!)  None Seen Final  . WBC, UA 11/22/2022 4  <=5 /hpf Final  . Red Blood Cells, Urinalysis 11/22/2022 5 (H)  <=3 /hpf Final  . Bacteria, Urinalysis 11/22/2022 0-5  0 - 5 /hpf Final  . Squamous Epithelial Cells, Urinaly* 11/22/2022 0  /hpf Final    Exam Height 162.6 cm (5' 4).  Wt Readings from Last 3 Encounters:  06/27/23 58.2 kg (128 lb 3.2 oz)  11/22/22 65 kg (143 lb 6.4 oz)  01/13/22 65.6 kg (144 lb 9.6 oz)   General: Alert oriented x3  Skin: No suspicious lesions or moles. Area of roughness of skin Rt elbow    Eyes: Sclera and conjunctiva clear; pupils equal round and reactive to light  extraocular movements intact Ears:Bilateral cerumen impaction noted  Nose: Mucosa healthy without drainage or ulceration Oropharynx: No suspicious lesions Neck: No swelling, masses, stiffness, pain, limited movement, carotid pulses normal bilaterally, thyroid  normal size, no masses palpated. No bruits heard. Lungs: Respirations unlabored; clear to auscultation bilaterally Back: No spinal deformity Cardiovascular: Heart regular rate and rhythm without murmurs, gallops, or rubs Abdomen: Soft; non tender; non distended;  no masses or organomegaly Lymph Nodes: No significant cervical or supraclavicular lymphadenopathy noted Musculoskeletal: no active joint inflammation Extremities: Normal, 1 + edema Concavity and ridging of nails of thumb noted  Neurologic: Alert and  oriented; speech intact; face symmetrical; moves all extremities well    Tremor and parkinsonian facies noted   Assessment and Plan:  1  Parkinson's disease:On Sinemet  and Gabapentin   Follow up with Dr. 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

## 2024-06-05 DIAGNOSIS — R262 Difficulty in walking, not elsewhere classified: Secondary | ICD-10-CM | POA: Diagnosis not present

## 2024-06-05 DIAGNOSIS — F3340 Major depressive disorder, recurrent, in remission, unspecified: Secondary | ICD-10-CM | POA: Diagnosis not present

## 2024-06-05 DIAGNOSIS — G20A1 Parkinson's disease without dyskinesia, without mention of fluctuations: Secondary | ICD-10-CM | POA: Diagnosis not present

## 2024-06-05 DIAGNOSIS — R413 Other amnesia: Secondary | ICD-10-CM | POA: Diagnosis not present

## 2024-06-06 ENCOUNTER — Other Ambulatory Visit (HOSPITAL_COMMUNITY): Payer: Self-pay

## 2024-06-06 MED ORDER — VITAMIN D (ERGOCALCIFEROL) 1.25 MG (50000 UNIT) PO CAPS
50000.0000 [IU] | ORAL_CAPSULE | ORAL | 2 refills | Status: AC
  Filled 2024-06-06: qty 4, 28d supply, fill #0

## 2024-06-12 ENCOUNTER — Other Ambulatory Visit (HOSPITAL_COMMUNITY): Payer: Self-pay

## 2024-06-12 ENCOUNTER — Other Ambulatory Visit: Payer: Self-pay

## 2024-06-12 DIAGNOSIS — E538 Deficiency of other specified B group vitamins: Secondary | ICD-10-CM | POA: Diagnosis not present

## 2024-06-12 DIAGNOSIS — F3341 Major depressive disorder, recurrent, in partial remission: Secondary | ICD-10-CM | POA: Diagnosis not present

## 2024-06-12 DIAGNOSIS — Z1331 Encounter for screening for depression: Secondary | ICD-10-CM | POA: Diagnosis not present

## 2024-06-12 DIAGNOSIS — R7989 Other specified abnormal findings of blood chemistry: Secondary | ICD-10-CM | POA: Diagnosis not present

## 2024-06-12 DIAGNOSIS — Z0000 Encounter for general adult medical examination without abnormal findings: Secondary | ICD-10-CM | POA: Diagnosis not present

## 2024-06-12 DIAGNOSIS — Z23 Encounter for immunization: Secondary | ICD-10-CM | POA: Diagnosis not present

## 2024-06-12 DIAGNOSIS — R413 Other amnesia: Secondary | ICD-10-CM | POA: Diagnosis not present

## 2024-06-12 DIAGNOSIS — G20A1 Parkinson's disease without dyskinesia, without mention of fluctuations: Secondary | ICD-10-CM | POA: Diagnosis not present

## 2024-06-12 MED ORDER — BUPROPION HCL ER (XL) 150 MG PO TB24
150.0000 mg | ORAL_TABLET | Freq: Every day | ORAL | 1 refills | Status: DC
  Filled 2024-06-12: qty 90, 90d supply, fill #0

## 2024-06-12 NOTE — Progress Notes (Signed)
 Chief Complaint  Patient presents with  . Annual Exam    Physical   . annual wellness visit    HPI  Michele Brewer is a 73 y.o. here for an Annual Physical and a Medicare Wellness visit  Hx of Parkinson's disease, Seasonal Allergies and Depression. Continues to experience tremors and feels unsteady at [employer] he speech is affected on some days  Denies chest pains or shortness of breath Has some ankle edema - worse in hot weather  Walks in the mornings Non Smoker. No alcohol . Sleeps well. Has gained weight Widowed- Has 2 adult children. Mother and sister live with her  Hgb: 12.2, Sugar: 79, Se creat: 0.76, A1c; 5.6 Total Cholesterol: 175, Triglycerides;47 TSH: 0.621 Vit D ; 19.9 and B12; 211      ROS Rest of 10 point review of systems is normal.  Outpatient Encounter Medications as of 06/12/2024  Medication Sig Dispense Refill  . calcium carbonate (CALCIUM 600 ORAL) Take 2 capsules by mouth once daily    . carbidopa -levodopa  (SINEMET ) 25-100 mg tablet Take 1.5 tablets by mouth 3 (three) times daily 405 tablet 3  . cyanocobalamin  (VITAMIN B12) 1,000 mcg/mL injection Inject into the muscle monthly    . cyanocobalamin  (VITAMIN B12) 1000 MCG tablet Take 1,000 mcg by mouth once daily    . cyanocobalamin , vitamin B-12, (VITAMIN B-12 ORAL) Take 1 tablet by mouth every morning before breakfast    . DULoxetine  (CYMBALTA ) 30 MG DR capsule Take 1 capsule (30 mg total) by mouth 2 (two) times daily 180 capsule 3  . ergocalciferol , vitamin D2, 1,250 mcg (50,000 unit) capsule Take 1 capsule (50,000 Units total) by mouth once a week 13 capsule 3  . ergocalciferol , vitamin D2, 1,250 mcg (50,000 unit) capsule Take 1 capsule (50,000 Units total) by mouth once a week for 84 days 4 capsule 2  . fluticasone  propionate (FLONASE ) 50 mcg/actuation nasal spray Place 2 sprays into one nostril once daily    . gabapentin  (NEURONTIN ) 100 MG capsule Take 1 capsule (100 mg total) by mouth 3 (three) times  daily for 180 days 270 capsule 3  . meclizine  (ANTIVERT ) 25 mg tablet Take 25 mg by mouth 3 (three) times daily as needed for Dizziness    . multivitamin capsule Take 1 capsule by mouth every other day    . olive oil external oil Take 1 capsule by mouth once daily    . olive oil Oil Take 1 capsule by mouth once daily    . polyethylene glycol (MIRALAX ) packet Take 17 g by mouth once daily    . thiamine  (VITAMIN B-1) 100 MG tablet Take 100 mg by mouth once daily    . folic acid  (FOLVITE ) 1 MG tablet Take 1 tablet (1 mg total) by mouth once daily for 180 days 90 tablet 1   No facility-administered encounter medications on file as of 06/12/2024.    Allergies as of 06/12/2024  . (No Known Allergies)    Past Medical History:  Diagnosis Date  . Depression   . PD (Parkinson's disease) (CMS/HHS-HCC)   . Vertigo     Past Surgical History:  Procedure Laterality Date  . CESAREAN DELIVERY      Blood pressure 118/72, pulse 70, height 162.6 cm (5' 4), weight 66.3 kg (146 lb 3.2 oz), SpO2 98%.   Body mass index is 25.1 kg/m. Wt Readings from Last 3 Encounters:  06/12/24 66.3 kg (146 lb 3.2 oz)  12/24/23 63.6 kg (140 lb 3.2 oz)  06/27/23 58.2 kg (128 lb 3.2 oz)     Ancillary Orders on 06/05/2024  Component Date Value Ref Range Status  . WBC (White Blood Cell Count) 06/05/2024 3.8 (L)  4.1 - 10.2 10^3/uL Final  . RBC (Red Blood Cell Count) 06/05/2024 3.87 (L)  4.04 - 5.48 10^6/uL Final  . Hemoglobin 06/05/2024 12.1  12.0 - 15.0 gm/dL Final  . Hematocrit 90/81/7974 36.5  35.0 - 47.0 % Final  . MCV (Mean Corpuscular Volume) 06/05/2024 94.3  80.0 - 100.0 fl Final  . MCH (Mean Corpuscular Hemoglobin) 06/05/2024 31.3 (H)  27.0 - 31.2 pg Final  . MCHC (Mean Corpuscular Hemoglobin * 06/05/2024 33.2  32.0 - 36.0 gm/dL Final  . Platelet Count 06/05/2024 174  150 - 450 10^3/uL Final  . RDW-CV (Red Cell Distribution Widt* 06/05/2024 13.1  11.6 - 14.8 % Final  . MPV (Mean Platelet Volume)  06/05/2024 11.8  9.4 - 12.4 fl Final  . Neutrophils 06/05/2024 1.98  1.50 - 7.80 10^3/uL Final  . Lymphocytes 06/05/2024 1.05  1.00 - 3.60 10^3/uL Final  . Monocytes 06/05/2024 0.55  0.00 - 1.50 10^3/uL Final  . Eosinophils 06/05/2024 0.15  0.00 - 0.55 10^3/uL Final  . Basophils 06/05/2024 0.03  0.00 - 0.09 10^3/uL Final  . Neutrophil % 06/05/2024 52.4  32.0 - 70.0 % Final  . Lymphocyte % 06/05/2024 27.9  10.0 - 50.0 % Final  . Monocyte % 06/05/2024 14.6 (H)  4.0 - 13.0 % Final  . Eosinophil % 06/05/2024 4.0  1.0 - 5.0 % Final  . Basophil% 06/05/2024 0.8  0.0 - 2.0 % Final  . Immature Granulocyte % 06/05/2024 0.3  <=0.7 % Final  . Immature Granulocyte Count 06/05/2024 0.01  <=0.06 10^3/L Final  . Glucose 06/05/2024 82  70 - 110 mg/dL Final  . Sodium 90/81/7974 139  136 - 145 mmol/L Final  . Potassium 06/05/2024 4.1  3.6 - 5.1 mmol/L Final  . Chloride 06/05/2024 102  97 - 109 mmol/L Final  . Carbon Dioxide (CO2) 06/05/2024 32.8 (H)  22.0 - 32.0 mmol/L Final  . Urea Nitrogen (BUN) 06/05/2024 17  7 - 25 mg/dL Final  . Creatinine 90/81/7974 0.7  0.6 - 1.1 mg/dL Final  . Glomerular Filtration Rate (eGFR) 06/05/2024 91  >60 mL/min/1.73sq m Final   CKD-EPI (2021) does not include patient's race in the calculation of eGFR.  Monitoring changes of plasma creatinine and eGFR over time is useful for monitoring kidney function.   Interpretive Ranges for eGFR (CKD-EPI 2021):  eGFR:       >60 mL/min/1.73 sq. m - Normal eGFR:       30-59 mL/min/1.73 sq. m - Moderately Decreased eGFR:       15-29 mL/min/1.73 sq. m  - Severely Decreased eGFR:       < 15 mL/min/1.73 sq. m  - Kidney Failure    Note: These eGFR calculations do not apply in acute situations when eGFR is changing rapidly or patients on dialysis.  . Calcium 06/05/2024 9.3  8.7 - 10.3 mg/dL Final  . AST  90/81/7974 22  8 - 39 U/L Final  . ALT  06/05/2024 5  5 - 38 U/L Final  . Alk Phos (alkaline Phosphatase) 06/05/2024 131 (H)  34 - 104  U/L Final  . Albumin 06/05/2024 4.0  3.5 - 4.8 g/dL Final  . Bilirubin, Total 06/05/2024 0.6  0.3 - 1.2 mg/dL Final  . Protein, Total 06/05/2024 7.0  6.1 - 7.9 g/dL Final  .  A/G Ratio 06/05/2024 1.3  1.0 - 5.0 gm/dL Final  . Hemoglobin J8R 06/05/2024 5.3  4.2 - 5.6 % Final  . Average Blood Glucose (Calc) 06/05/2024 105  mg/dL Final  . Cholesterol, Total 06/05/2024 170  100 - 200 mg/dL Final  . Triglyceride 90/81/7974 76  35 - 199 mg/dL Final  . HDL (High Density Lipoprotein) Cho* 06/05/2024 64.1  35.0 - 85.0 mg/dL Final  . LDL Calculated 06/05/2024 91  0 - 130 mg/dL Final  . VLDL Cholesterol 06/05/2024 15  mg/dL Final  . Cholesterol/HDL Ratio 06/05/2024 2.7   Final  . Creatinine, Random Urine 06/05/2024 168.4  37.0 - 250.0 mg/dL Final  . Urine Albumin, Random 06/05/2024 8    mg/L Final  . Urine Albumin/Creatinine Ratio 06/05/2024 4.8  <30.0 ug/mg Final   Urine:         Spot collection              (g/mg creatinine)     Normal               < 30   Moderately          30-299         increased   Clinical             >=300 albuminuria  . Thyroid  Stimulating Hormone (TSH) 06/05/2024 0.353 (L)  0.450-5.330 uIU/ml uIU/mL Final   Reference Range for Pregnant Females >= 24 yrs old: Normal Range for 1st trimester: 0.05-3.70 ulU/ml Normal Range for 2nd trimester: 0.31-4.35 ulU/ml  . Color 06/05/2024 Yellow  Colorless, Straw, Light Yellow, Yellow, Dark Yellow Final  . Clarity 06/05/2024 Clear  Clear Final  . Specific Gravity 06/05/2024 1.024  1.005 - 1.030 Final  . pH, Urine 06/05/2024 7.0  5.0 - 8.0 Final  . Protein, Urinalysis 06/05/2024 Negative  Negative mg/dL Final  . Glucose, Urinalysis 06/05/2024 Negative  Negative mg/dL Final  . Ketones, Urinalysis 06/05/2024 Negative  Negative mg/dL Final  . Blood, Urinalysis 06/05/2024 Negative  Negative Final  . Nitrite, Urinalysis 06/05/2024 Negative  Negative Final  . Leukocyte Esterase, Urinalysis 06/05/2024 Negative  Negative Final  .  Bilirubin, Urinalysis 06/05/2024 Negative  Negative Final  . Urobilinogen, Urinalysis 06/05/2024 4.0 (H)  0.2 - 1.0 mg/dL Final  . WBC, UA 90/81/7974 <1  <=5 /hpf Final  . Red Blood Cells, Urinalysis 06/05/2024 2  <=3 /hpf Final  . Bacteria, Urinalysis 06/05/2024 0-5  0 - 5 /hpf Final  . Squamous Epithelial Cells, Urinaly* 06/05/2024 0  /hpf Final  . Vitamin B12 06/05/2024 211 (L)  >300 pg/mL Final  . Ferritin 06/05/2024 28  11 - 307 ng/mL Final  . Vitamin D , 25-Hydroxy - [facility] 06/05/2024 19.9 (L)  30.0 - 100.0 ng/mL Final   Vitamin D  deficiency has been defined by the Institute of Medicine and an Endocrine Society practice guideline as a level of serum 25-OH vitamin D  less than 20 ng/mL (1,2). The Endocrine Society went on to further define vitamin D  insufficiency as a level between 21 and 29 ng/mL (2). 1. IOM (Institute of Medicine). 2010. Dietary reference    intakes for calcium and D. [place]: The    [employer]. 2. Holick MF, Binkley [place], Bischoff-Ferrari HA, et al.    Evaluation, treatment, and prevention of vitamin D     deficiency: an Endocrine Society clinical practice    guideline. JCEM. 2011 Jul; 96(7):1911-30.  . Folate (Folic Acid ) 06/05/2024 5.4  >4.0 ng/mL Final    Exam  Blood pressure 118/72, pulse 70, height 162.6 cm (5' 4), weight 66.3 kg (146 lb 3.2 oz), SpO2 98%.  Wt Readings from Last 3 Encounters:  06/12/24 66.3 kg (146 lb 3.2 oz)  12/24/23 63.6 kg (140 lb 3.2 oz)  06/27/23 58.2 kg (128 lb 3.2 oz)   General: Alert oriented x3  Skin: No suspicious lesions or moles. Area of roughness of skin Rt elbow    Eyes: Sclera and conjunctiva clear; pupils equal round and reactive to light  extraocular movements intact Ears:Bilateral cerumen impaction noted  Nose: Mucosa healthy without drainage or ulceration Oropharynx: No suspicious lesions Neck: No swelling, masses, stiffness, pain, limited movement, carotid pulses normal bilaterally, thyroid   normal size, no masses palpated. No bruits heard. Lungs: Respirations unlabored; clear to auscultation bilaterally Back: No spinal deformity Cardiovascular: Heart regular rate and rhythm without murmurs, gallops, or rubs Abdomen: Soft; non tender; non distended;  no masses or organomegaly Lymph Nodes: No significant cervical or supraclavicular lymphadenopathy noted Musculoskeletal: no active joint inflammation Extremities: Normal, 1 + edema Concavity and ridging of nails of thumb noted  Neurologic: Alert and oriented; speech intact; face symmetrical; moves all extremities well    Tremor and parkinsonian facies noted   Assessment and Plan:  1  Parkinson's disease:On Sinemet  and Gabapentin   Follow up with Dr. Shah-Neurology  2 Major depression in remission: On Cymbalta   Add Wellbutrin  XL 150 mg po qd  3 Low TSH- (0.621)  u/s of thyroid  - 2 sub centimeter nodules  Hypo echoeic mass adjacent to Rt mid pole - PTH was ok (57)  4  DEXA: Osteopenia ; On Calcium and Vit D  5 Leg edema; Keep feet elevated - Low sodium diet  6 Low B12 and Low Vit D ; Inj B12 today  Continue supplements  7 Health Maintenance: Up to date with Flu shot (Today) and Pneumovax, Prevnar 13 ,Inj Tdap  ,Inj Shingrix and COVID vaccine  Discussed results of recent labs  Colonoscopy a few yrs back- Divertics  Labs prior to next visit  Follow up in 6 months   Vishwanath Hande  MD  Romie Keeble is a 73 y.o. here for Medicare Wellness Visit  MEDICARE WELLNESS VISIT  Providers Rendering Care 1. Dr. Tamra Leventhal (PCP)  Functional Assessment (1) Hearing: Demonstrates no difficulty in hearing during normal conversation (2) Risk of Falls: Patient denies any falls or near falls in the last year, Gait is unsteady without assistance during walk from waiting area to exam room Uses a cane  (3) Home Safety: Patient feels secure in their home, There are operational smoke alarms in multiple areas of the home (4)  Activities of Daily Living: Independently manages personal grooming and household chores, including cooking, cleaning and laundry. Manages Personal finances without assistance.  Depression Screening PHQ 2/9 last 3 flowsheet values     01/13/2022    5:06 PM 06/27/2023    1:30 PM 06/12/2024   11:24 AM  PHQ-2/9 Depression Screening   Little interest or pleasure in doing things  0 0  Feeling down, depressed, or hopeless  0 0  Patient Health Questionnaire-2 Score  0 * 0  (OBSOLETE) Little interest or pleasure in doing things 1    (OBSOLETE) Total Prescreening Score 1    (OBSOLETE) Total Score = 1      * Data saved with a previous flowsheet row definition     Depression Severity and Treatment Recommendations:  0-4= None  5-9= Mild / Treatment: Support, educate to call  if worse; return in one month  10-14= Moderate / Treatment: Support, watchful waiting; Antidepressant or Psychotherapy  15-19= Moderately severe / Treatment: Antidepressant OR Psychotherapy  >= 20 = Major depression, severe / Antidepressant AND Psychotherapy On Medications for Depression   Cognitive Impairment Patient has  episodes of being forgetful. Seems oriented to person, place and time.  Responses appear appropriate and timely to this observer.  PREVENTION PLAN  Cardiovascular: FLP assessed; 06/05/24; LDL; 91 Diabetes: A1c or FBG assessed; 06/05/24; A1c; 5.3 Glaucoma: N/A Hepatitis B (HBV) Vaccine:  Not Applicable Smoking Cessation:  Not Applicable  Other Personalized Health Advice  Encouraged patient to exercise 5 days a week, walking, water aerobics, gentle stretching recommended. Increase dietary intake of fresh fruits and vegetables, reduce red meat to twice a week.  End of Life Counseling Patient has living will in place; Has designated her minster Curtistine Molt as  her POA - ; Pt is DNR   Current Outpatient Medications  Medication Sig Dispense Refill  . calcium carbonate (CALCIUM 600 ORAL) Take 2  capsules by mouth once daily    . carbidopa -levodopa  (SINEMET ) 25-100 mg tablet Take 1.5 tablets by mouth 3 (three) times daily 405 tablet 3  . cyanocobalamin  (VITAMIN B12) 1,000 mcg/mL injection Inject into the muscle monthly    . cyanocobalamin  (VITAMIN B12) 1000 MCG tablet Take 1,000 mcg by mouth once daily    . cyanocobalamin , vitamin B-12, (VITAMIN B-12 ORAL) Take 1 tablet by mouth every morning before breakfast    . DULoxetine  (CYMBALTA ) 30 MG DR capsule Take 1 capsule (30 mg total) by mouth 2 (two) times daily 180 capsule 3  . ergocalciferol , vitamin D2, 1,250 mcg (50,000 unit) capsule Take 1 capsule (50,000 Units total) by mouth once a week for 84 days 4 capsule 2  . fluticasone  propionate (FLONASE ) 50 mcg/actuation nasal spray Place 2 sprays into one nostril once daily    . gabapentin  (NEURONTIN ) 100 MG capsule Take 1 capsule (100 mg total) by mouth 3 (three) times daily for 180 days 270 capsule 3  . meclizine  (ANTIVERT ) 25 mg tablet Take 25 mg by mouth 3 (three) times daily as needed for Dizziness    . multivitamin capsule Take 1 capsule by mouth every other day    . olive oil external oil Take 1 capsule by mouth once daily    . olive oil Oil Take 1 capsule by mouth once daily    . polyethylene glycol (MIRALAX ) packet Take 17 g by mouth once daily    . thiamine  (VITAMIN B-1) 100 MG tablet Take 100 mg by mouth once daily    . buPROPion  (WELLBUTRIN  XL) 150 MG XL tablet Take 1 tablet (150 mg total) by mouth once daily 90 tablet 1  . folic acid  (FOLVITE ) 1 MG tablet Take 1 tablet (1 mg total) by mouth once daily for 180 days 90 tablet 1   No current facility-administered medications for this visit.    Allergies as of 06/12/2024  . (No Known Allergies)    Patient Active Problem List  Diagnosis  . PD (Parkinson's disease) (CMS/HHS-HCC)  . Parkinson's disease (CMS/HHS-HCC)  . Major depressive disorder, recurrent, in remission ()  . Osteopenia  . Frequent falls  . Fall  .  Ambulatory dysfunction  . Vitamin B12 deficiency  . Folate deficiency  . Loss of memory  . Hypoalbuminemia  . Low vitamin B12 level    Past Medical History:  Diagnosis Date  . Depression   . PD (Parkinson's  disease) (CMS/HHS-HCC)   . Vertigo     Past Surgical History:  Procedure Laterality Date  . CESAREAN DELIVERY      Health Maintenance  Topic Date Due  . Colorectal Cancer Screening  Never done  . Annual Physical/Well Child Check  03/05/2022  . Medicare Intial Physical (IPPE) [phone]  Never done  . Influenza Vaccine (1) 05/19/2024  . Medicare Subsequent AWV [id number]  06/27/2024  . DXA Bone Density Scan  12/22/2024  . RSV Immunization Pregnant or 60+ (1 - 1-dose 75+ series) 01/29/2025  . Depression Screening  06/12/2025  . Diabetes Screening  06/06/2027  . Lipid Panel  06/05/2029  . Adult Tetanus (Td And Tdap)  06/05/2029  . Pneumococcal Vaccine: 50+  Completed  . Shingrix  Completed  . Hepatitis C Screen  Completed  . Hib Vaccines  Aged Out  . Hepatitis A Vaccines  Aged Out  . Meningococcal B Vaccine  Aged Out  . Meningococcal ACWY Vaccine  Aged Out  . HPV Vaccines  Aged Out  . Mammogram  Discontinued    Vitals:   06/12/24 1129  BP: 118/72  Pulse: 70  SpO2: 98%  Weight: 66.3 kg (146 lb 3.2 oz)  Height: 162.6 cm (5' 4)  PainSc: 0-No pain   Body mass index is 25.1 kg/m.  Assessment/Plan  1. Medicare wellness visit- Medications and allergies reviewed. Copy of preventative health provided.  Labs reviewed.   TAMRA ETHELENE LEVENTHAL, MD       *Some images could not be shown.

## 2024-06-17 ENCOUNTER — Other Ambulatory Visit: Payer: Self-pay

## 2024-06-17 ENCOUNTER — Inpatient Hospital Stay
Admission: EM | Admit: 2024-06-17 | Discharge: 2024-06-23 | DRG: 522 | Disposition: A | Discharge status: Skilled Nursing Facility | Attending: Internal Medicine | Admitting: Internal Medicine

## 2024-06-17 ENCOUNTER — Inpatient Hospital Stay

## 2024-06-17 ENCOUNTER — Inpatient Hospital Stay: Admitting: Anesthesiology

## 2024-06-17 ENCOUNTER — Emergency Department

## 2024-06-17 ENCOUNTER — Encounter
Admission: EM | Disposition: A | Discharge status: Skilled Nursing Facility | Payer: Self-pay | Source: Home / Self Care | Attending: Internal Medicine

## 2024-06-17 DIAGNOSIS — R296 Repeated falls: Secondary | ICD-10-CM | POA: Diagnosis present

## 2024-06-17 DIAGNOSIS — S72031A Displaced midcervical fracture of right femur, initial encounter for closed fracture: Secondary | ICD-10-CM | POA: Diagnosis not present

## 2024-06-17 DIAGNOSIS — Z8249 Family history of ischemic heart disease and other diseases of the circulatory system: Secondary | ICD-10-CM | POA: Diagnosis not present

## 2024-06-17 DIAGNOSIS — Y92009 Unspecified place in unspecified non-institutional (private) residence as the place of occurrence of the external cause: Secondary | ICD-10-CM | POA: Diagnosis not present

## 2024-06-17 DIAGNOSIS — Z043 Encounter for examination and observation following other accident: Secondary | ICD-10-CM | POA: Diagnosis not present

## 2024-06-17 DIAGNOSIS — M6281 Muscle weakness (generalized): Secondary | ICD-10-CM | POA: Diagnosis not present

## 2024-06-17 DIAGNOSIS — Z7982 Long term (current) use of aspirin: Secondary | ICD-10-CM | POA: Diagnosis not present

## 2024-06-17 DIAGNOSIS — Z79899 Other long term (current) drug therapy: Secondary | ICD-10-CM | POA: Diagnosis not present

## 2024-06-17 DIAGNOSIS — E559 Vitamin D deficiency, unspecified: Secondary | ICD-10-CM | POA: Diagnosis present

## 2024-06-17 DIAGNOSIS — G20A1 Parkinson's disease without dyskinesia, without mention of fluctuations: Secondary | ICD-10-CM | POA: Diagnosis not present

## 2024-06-17 DIAGNOSIS — S63301A Traumatic rupture of unspecified ligament of right wrist, initial encounter: Secondary | ICD-10-CM

## 2024-06-17 DIAGNOSIS — S63501A Unspecified sprain of right wrist, initial encounter: Secondary | ICD-10-CM | POA: Diagnosis not present

## 2024-06-17 DIAGNOSIS — S72143A Displaced intertrochanteric fracture of unspecified femur, initial encounter for closed fracture: Secondary | ICD-10-CM | POA: Diagnosis not present

## 2024-06-17 DIAGNOSIS — Z91010 Allergy to peanuts: Secondary | ICD-10-CM | POA: Diagnosis not present

## 2024-06-17 DIAGNOSIS — E611 Iron deficiency: Secondary | ICD-10-CM | POA: Diagnosis not present

## 2024-06-17 DIAGNOSIS — R609 Edema, unspecified: Secondary | ICD-10-CM | POA: Diagnosis not present

## 2024-06-17 DIAGNOSIS — M1811 Unilateral primary osteoarthritis of first carpometacarpal joint, right hand: Secondary | ICD-10-CM | POA: Diagnosis not present

## 2024-06-17 DIAGNOSIS — K5900 Constipation, unspecified: Secondary | ICD-10-CM | POA: Diagnosis present

## 2024-06-17 DIAGNOSIS — M25551 Pain in right hip: Secondary | ICD-10-CM | POA: Diagnosis present

## 2024-06-17 DIAGNOSIS — M160 Bilateral primary osteoarthritis of hip: Secondary | ICD-10-CM | POA: Diagnosis not present

## 2024-06-17 DIAGNOSIS — S72001A Fracture of unspecified part of neck of right femur, initial encounter for closed fracture: Secondary | ICD-10-CM | POA: Diagnosis not present

## 2024-06-17 DIAGNOSIS — Z471 Aftercare following joint replacement surgery: Secondary | ICD-10-CM | POA: Diagnosis not present

## 2024-06-17 DIAGNOSIS — Z7401 Bed confinement status: Secondary | ICD-10-CM | POA: Diagnosis not present

## 2024-06-17 DIAGNOSIS — G20B1 Parkinson's disease with dyskinesia, without mention of fluctuations: Secondary | ICD-10-CM | POA: Diagnosis not present

## 2024-06-17 DIAGNOSIS — Z96641 Presence of right artificial hip joint: Secondary | ICD-10-CM | POA: Diagnosis not present

## 2024-06-17 DIAGNOSIS — M19031 Primary osteoarthritis, right wrist: Secondary | ICD-10-CM | POA: Diagnosis not present

## 2024-06-17 DIAGNOSIS — W010XXA Fall on same level from slipping, tripping and stumbling without subsequent striking against object, initial encounter: Secondary | ICD-10-CM | POA: Diagnosis present

## 2024-06-17 DIAGNOSIS — Z91018 Allergy to other foods: Secondary | ICD-10-CM | POA: Diagnosis not present

## 2024-06-17 DIAGNOSIS — R9431 Abnormal electrocardiogram [ECG] [EKG]: Secondary | ICD-10-CM | POA: Diagnosis not present

## 2024-06-17 DIAGNOSIS — S72001D Fracture of unspecified part of neck of right femur, subsequent encounter for closed fracture with routine healing: Secondary | ICD-10-CM | POA: Diagnosis not present

## 2024-06-17 DIAGNOSIS — R2689 Other abnormalities of gait and mobility: Secondary | ICD-10-CM | POA: Diagnosis not present

## 2024-06-17 DIAGNOSIS — F3340 Major depressive disorder, recurrent, in remission, unspecified: Secondary | ICD-10-CM | POA: Diagnosis not present

## 2024-06-17 DIAGNOSIS — R278 Other lack of coordination: Secondary | ICD-10-CM | POA: Diagnosis not present

## 2024-06-17 DIAGNOSIS — F32A Depression, unspecified: Secondary | ICD-10-CM | POA: Diagnosis present

## 2024-06-17 DIAGNOSIS — E538 Deficiency of other specified B group vitamins: Secondary | ICD-10-CM | POA: Diagnosis not present

## 2024-06-17 DIAGNOSIS — I10 Essential (primary) hypertension: Secondary | ICD-10-CM | POA: Diagnosis not present

## 2024-06-17 DIAGNOSIS — S63391A Traumatic rupture of other ligament of right wrist, initial encounter: Secondary | ICD-10-CM | POA: Diagnosis not present

## 2024-06-17 DIAGNOSIS — S63011A Subluxation of distal radioulnar joint of right wrist, initial encounter: Secondary | ICD-10-CM | POA: Diagnosis not present

## 2024-06-17 HISTORY — PX: HIP ARTHROPLASTY: SHX981

## 2024-06-17 LAB — COMPREHENSIVE METABOLIC PANEL WITH GFR
ALT: 18 U/L (ref 0–44)
AST: 28 U/L (ref 15–41)
Albumin: 3.7 g/dL (ref 3.5–5.0)
Alkaline Phosphatase: 98 U/L (ref 38–126)
Anion gap: 11 (ref 5–15)
BUN: 25 mg/dL — ABNORMAL HIGH (ref 8–23)
CO2: 26 mmol/L (ref 22–32)
Calcium: 9.4 mg/dL (ref 8.9–10.3)
Chloride: 101 mmol/L (ref 98–111)
Creatinine, Ser: 0.78 mg/dL (ref 0.44–1.00)
GFR, Estimated: >60 mL/min (ref >60)
Glucose, Bld: 110 mg/dL — ABNORMAL HIGH (ref 70–99)
Potassium: 4 mmol/L (ref 3.5–5.1)
Sodium: 138 mmol/L (ref 135–145)
Total Bilirubin: 1.1 mg/dL (ref 0.0–1.2)
Total Protein: 7.5 g/dL (ref 6.5–8.1)

## 2024-06-17 LAB — CBC WITH DIFFERENTIAL/PLATELET
Abs Immature Granulocytes: 0.02 K/uL (ref 0.00–0.07)
Basophils Absolute: 0 K/uL (ref 0.0–0.1)
Basophils Relative: 0 %
Eosinophils Absolute: 0 K/uL (ref 0.0–0.5)
Eosinophils Relative: 0 %
HCT: 33.4 % — ABNORMAL LOW (ref 36.0–46.0)
Hemoglobin: 11.9 g/dL — ABNORMAL LOW (ref 12.0–15.0)
Immature Granulocytes: 0 %
Lymphocytes Relative: 6 %
Lymphs Abs: 0.4 K/uL — ABNORMAL LOW (ref 0.7–4.0)
MCH: 33.1 pg (ref 26.0–34.0)
MCHC: 35.6 g/dL (ref 30.0–36.0)
MCV: 93 fL (ref 80.0–100.0)
Monocytes Absolute: 0.4 K/uL (ref 0.1–1.0)
Monocytes Relative: 5 %
Neutro Abs: 6.4 K/uL (ref 1.7–7.7)
Neutrophils Relative %: 89 %
Platelets: 167 K/uL (ref 150–400)
RBC: 3.59 MIL/uL — ABNORMAL LOW (ref 3.87–5.11)
RDW: 13.2 % (ref 11.5–15.5)
WBC: 7.2 K/uL (ref 4.0–10.5)
nRBC: 0 % (ref 0.0–0.2)

## 2024-06-17 LAB — IRON AND TIBC
Iron: 25 ug/dL — ABNORMAL LOW (ref 28–170)
Saturation Ratios: 7 % — ABNORMAL LOW (ref 10.4–31.8)
TIBC: 381 ug/dL (ref 250–450)
UIBC: 356 ug/dL

## 2024-06-17 LAB — TYPE AND SCREEN
ABO/RH(D): B POS
Antibody Screen: NEGATIVE

## 2024-06-17 LAB — VITAMIN D 25 HYDROXY (VIT D DEFICIENCY, FRACTURES): Vit D, 25-Hydroxy: 29.06 ng/mL — ABNORMAL LOW (ref 30–100)

## 2024-06-17 LAB — MAGNESIUM: Magnesium: 2.1 mg/dL (ref 1.7–2.4)

## 2024-06-17 LAB — PHOSPHORUS: Phosphorus: 2.6 mg/dL (ref 2.5–4.6)

## 2024-06-17 SURGERY — HEMIARTHROPLASTY (BIPOLAR) HIP, POSTERIOR APPROACH FOR FRACTURE
Anesthesia: Spinal | Laterality: Right

## 2024-06-17 MED ORDER — CEFAZOLIN SODIUM-DEXTROSE 2-4 GM/100ML-% IV SOLN
2.0000 g | INTRAVENOUS | Status: AC
  Administered 2024-06-17: 2 g via INTRAVENOUS

## 2024-06-17 MED ORDER — BUPIVACAINE-EPINEPHRINE (PF) 0.5% -1:200000 IJ SOLN
INTRAMUSCULAR | Status: AC
  Filled 2024-06-17: qty 30

## 2024-06-17 MED ORDER — DULOXETINE HCL 30 MG PO CPEP
30.0000 mg | ORAL_CAPSULE | Freq: Two times a day (BID) | ORAL | Status: DC
Start: 2024-06-17 — End: 2024-06-23
  Administered 2024-06-17 – 2024-06-23 (×13): 30 mg via ORAL
  Filled 2024-06-17 (×12): qty 1

## 2024-06-17 MED ORDER — ACETAMINOPHEN 10 MG/ML IV SOLN
INTRAVENOUS | Status: AC
Start: 2024-06-17 — End: 2024-06-17
  Filled 2024-06-17: qty 100

## 2024-06-17 MED ORDER — MORPHINE SULFATE (PF) 4 MG/ML IV SOLN
4.0000 mg | Freq: Once | INTRAVENOUS | Status: AC
  Administered 2024-06-17: 4 mg via INTRAVENOUS
  Filled 2024-06-17: qty 1

## 2024-06-17 MED ORDER — BUPIVACAINE-EPINEPHRINE (PF) 0.5% -1:200000 IJ SOLN
INTRAMUSCULAR | Status: DC | PRN
  Administered 2024-06-17: 30 mL

## 2024-06-17 MED ORDER — TRANEXAMIC ACID-NACL 1000-0.7 MG/100ML-% IV SOLN
INTRAVENOUS | Status: AC
  Filled 2024-06-17: qty 100

## 2024-06-17 MED ORDER — ONDANSETRON HCL 4 MG/2ML IJ SOLN: 4.0000 mg | Freq: Four times a day (QID) | INTRAMUSCULAR | Status: DC | PRN

## 2024-06-17 MED ORDER — SODIUM CHLORIDE 0.9 % IR SOLN
Status: DC | PRN
  Administered 2024-06-17: 3000 mL

## 2024-06-17 MED ORDER — FOLIC ACID 1 MG PO TABS
1.0000 mg | ORAL_TABLET | Freq: Every day | ORAL | Status: DC
  Administered 2024-06-17 – 2024-06-23 (×7): 1 mg via ORAL
  Filled 2024-06-17 (×7): qty 1

## 2024-06-17 MED ORDER — VITAMIN D (ERGOCALCIFEROL) 1.25 MG (50000 UNIT) PO CAPS
50000.0000 [IU] | ORAL_CAPSULE | ORAL | Status: DC
Start: 2024-06-17 — End: 2024-06-23
  Administered 2024-06-18: 50000 [IU] via ORAL
  Filled 2024-06-17: qty 1

## 2024-06-17 MED ORDER — MELATONIN 5 MG PO TABS
5.0000 mg | ORAL_TABLET | Freq: Every evening | ORAL | Status: DC | PRN
  Administered 2024-06-20 – 2024-06-21 (×2): 5 mg via ORAL
  Filled 2024-06-17 (×2): qty 1

## 2024-06-17 MED ORDER — DIPHENHYDRAMINE HCL 12.5 MG/5ML PO ELIX: 12.5000 mg | ORAL_SOLUTION | ORAL | Status: DC | PRN

## 2024-06-17 MED ORDER — SODIUM CHLORIDE 0.9 % IV SOLN
INTRAVENOUS | Status: DC | PRN
  Administered 2024-06-17: 100 mL via TOPICAL

## 2024-06-17 MED ORDER — MAGNESIUM HYDROXIDE 400 MG/5ML PO SUSP: 30.0000 mL | Freq: Every day | ORAL | Status: DC | PRN

## 2024-06-17 MED ORDER — PHENYLEPHRINE HCL-NACL 20-0.9 MG/250ML-% IV SOLN
INTRAVENOUS | Status: DC | PRN
  Administered 2024-06-17: 20 ug/min via INTRAVENOUS

## 2024-06-17 MED ORDER — FLEET ENEMA RE ENEM: 1.0000 | ENEMA | Freq: Once | RECTAL | Status: DC | PRN

## 2024-06-17 MED ORDER — PHENYLEPHRINE 80 MCG/ML (10ML) SYRINGE FOR IV PUSH (FOR BLOOD PRESSURE SUPPORT)
PREFILLED_SYRINGE | INTRAVENOUS | Status: DC | PRN
  Administered 2024-06-17 (×2): 40 ug via INTRAVENOUS

## 2024-06-17 MED ORDER — BISACODYL 10 MG RE SUPP
10.0000 mg | Freq: Every day | RECTAL | Status: DC | PRN
  Filled 2024-06-17: qty 1

## 2024-06-17 MED ORDER — SODIUM CHLORIDE (PF) 0.9 % IJ SOLN
INTRAMUSCULAR | Status: AC
  Filled 2024-06-17: qty 50

## 2024-06-17 MED ORDER — METHOCARBAMOL 1000 MG/10ML IJ SOLN: 500.0000 mg | Freq: Four times a day (QID) | INTRAMUSCULAR | Status: DC | PRN

## 2024-06-17 MED ORDER — OXYCODONE HCL 5 MG/5ML PO SOLN: 5.0000 mg | Freq: Once | ORAL | Status: DC | PRN

## 2024-06-17 MED ORDER — SODIUM CHLORIDE 0.9 % IV SOLN: INTRAVENOUS | Status: DC

## 2024-06-17 MED ORDER — LIDOCAINE HCL (CARDIAC) PF 100 MG/5ML IV SOSY
PREFILLED_SYRINGE | INTRAVENOUS | Status: DC | PRN
  Administered 2024-06-17: 3 mL via INTRAVENOUS

## 2024-06-17 MED ORDER — ONDANSETRON HCL 4 MG PO TABS: 4.0000 mg | ORAL_TABLET | Freq: Four times a day (QID) | ORAL | Status: DC | PRN

## 2024-06-17 MED ORDER — ACETAMINOPHEN 10 MG/ML IV SOLN: 1000.0000 mg | Freq: Once | INTRAVENOUS | Status: DC | PRN

## 2024-06-17 MED ORDER — TRANEXAMIC ACID-NACL 1000-0.7 MG/100ML-% IV SOLN
INTRAVENOUS | Status: DC | PRN
  Administered 2024-06-17: 1000 mg via INTRAVENOUS

## 2024-06-17 MED ORDER — SODIUM CHLORIDE 0.9 % IV SOLN
INTRAVENOUS | Status: AC
Start: 2024-06-17 — End: 2024-06-18

## 2024-06-17 MED ORDER — SODIUM CHLORIDE 0.9% FLUSH: 3.0000 mL | INTRAVENOUS | Status: DC | PRN

## 2024-06-17 MED ORDER — MECLIZINE HCL 25 MG PO TABS: 25.0000 mg | ORAL_TABLET | Freq: Three times a day (TID) | ORAL | Status: DC | PRN

## 2024-06-17 MED ORDER — HYDROCODONE-ACETAMINOPHEN 5-325 MG PO TABS
1.0000 | ORAL_TABLET | Freq: Four times a day (QID) | ORAL | Status: DC | PRN
  Administered 2024-06-19: 1 via ORAL
  Administered 2024-06-22 – 2024-06-23 (×2): 2 via ORAL
  Filled 2024-06-17: qty 2
  Filled 2024-06-17: qty 1
  Filled 2024-06-17 (×3): qty 2

## 2024-06-17 MED ORDER — LIDOCAINE HCL (PF) 2 % IJ SOLN
INTRAMUSCULAR | Status: AC
  Filled 2024-06-17: qty 5

## 2024-06-17 MED ORDER — ACETAMINOPHEN 500 MG PO TABS
1000.0000 mg | ORAL_TABLET | Freq: Four times a day (QID) | ORAL | Status: AC
  Administered 2024-06-18 (×3): 1000 mg via ORAL
  Filled 2024-06-17 (×4): qty 2

## 2024-06-17 MED ORDER — GABAPENTIN 100 MG PO CAPS
100.0000 mg | ORAL_CAPSULE | Freq: Three times a day (TID) | ORAL | Status: DC
  Administered 2024-06-17 – 2024-06-23 (×18): 100 mg via ORAL
  Filled 2024-06-17 (×18): qty 1

## 2024-06-17 MED ORDER — IRON SUCROSE 300 MG IVPB - SIMPLE MED
300.0000 mg | Freq: Once | Status: AC
Start: 2024-06-17 — End: 2024-06-18
  Administered 2024-06-18: 300 mg via INTRAVENOUS
  Filled 2024-06-17: qty 265

## 2024-06-17 MED ORDER — ENOXAPARIN SODIUM 40 MG/0.4ML IJ SOSY
40.0000 mg | PREFILLED_SYRINGE | INTRAMUSCULAR | Status: DC
  Administered 2024-06-18 – 2024-06-23 (×6): 40 mg via SUBCUTANEOUS
  Filled 2024-06-17 (×6): qty 0.4

## 2024-06-17 MED ORDER — SODIUM CHLORIDE 0.9 % IV SOLN: 250.0000 mL | INTRAVENOUS | Status: AC | PRN

## 2024-06-17 MED ORDER — TRIAMCINOLONE ACETONIDE 40 MG/ML IJ SUSP
INTRAMUSCULAR | Status: AC
  Filled 2024-06-17: qty 2

## 2024-06-17 MED ORDER — KETOROLAC TROMETHAMINE 15 MG/ML IJ SOLN: 15.0000 mg | Freq: Once | INTRAMUSCULAR | Status: DC

## 2024-06-17 MED ORDER — ENOXAPARIN SODIUM 40 MG/0.4ML IJ SOSY: 40.0000 mg | PREFILLED_SYRINGE | Freq: Every evening | INTRAMUSCULAR | Status: DC

## 2024-06-17 MED ORDER — PROPOFOL 500 MG/50ML IV EMUL
INTRAVENOUS | Status: DC | PRN
  Administered 2024-06-17: 20 ug/kg/min via INTRAVENOUS

## 2024-06-17 MED ORDER — TRIAMCINOLONE ACETONIDE 40 MG/ML IJ SUSP
INTRAMUSCULAR | Status: DC | PRN
  Administered 2024-06-17: 80 mg via INTRAMUSCULAR

## 2024-06-17 MED ORDER — FENTANYL CITRATE (PF) 100 MCG/2ML IJ SOLN
INTRAMUSCULAR | Status: AC
  Filled 2024-06-17: qty 2

## 2024-06-17 MED ORDER — METOCLOPRAMIDE HCL 10 MG PO TABS: 5.0000 mg | ORAL_TABLET | Freq: Three times a day (TID) | ORAL | Status: DC | PRN

## 2024-06-17 MED ORDER — PHENYLEPHRINE HCL (PRESSORS) 10 MG/ML IV SOLN
INTRAVENOUS | Status: AC
  Filled 2024-06-17: qty 1

## 2024-06-17 MED ORDER — BUPIVACAINE LIPOSOME 1.3 % IJ SUSP
INTRAMUSCULAR | Status: AC
  Filled 2024-06-17: qty 20

## 2024-06-17 MED ORDER — ACETAMINOPHEN 325 MG RE SUPP: 650.0000 mg | Freq: Four times a day (QID) | RECTAL | Status: DC | PRN

## 2024-06-17 MED ORDER — MORPHINE SULFATE (PF) 2 MG/ML IV SOLN
2.0000 mg | INTRAVENOUS | Status: DC | PRN
  Administered 2024-06-17: 2 mg via INTRAVENOUS
  Filled 2024-06-17: qty 1

## 2024-06-17 MED ORDER — SODIUM CHLORIDE 0.9% FLUSH
INTRAVENOUS | Status: DC | PRN
  Administered 2024-06-17: 10 mL

## 2024-06-17 MED ORDER — ONDANSETRON HCL 4 MG/2ML IJ SOLN
4.0000 mg | Freq: Four times a day (QID) | INTRAMUSCULAR | Status: DC | PRN
  Administered 2024-06-17: 4 mg via INTRAVENOUS
  Filled 2024-06-17: qty 2

## 2024-06-17 MED ORDER — ACETAMINOPHEN 10 MG/ML IV SOLN
INTRAVENOUS | Status: DC | PRN
  Administered 2024-06-17: 1000 mg via INTRAVENOUS

## 2024-06-17 MED ORDER — PROPOFOL 10 MG/ML IV BOLUS
INTRAVENOUS | Status: AC
  Filled 2024-06-17: qty 20

## 2024-06-17 MED ORDER — CARBIDOPA-LEVODOPA 25-100 MG PO TABS: 1.5000 | ORAL_TABLET | Freq: Every day | ORAL | Status: DC

## 2024-06-17 MED ORDER — BUPIVACAINE HCL (PF) 0.5 % IJ SOLN
INTRAMUSCULAR | Status: AC
  Filled 2024-06-17: qty 10

## 2024-06-17 MED ORDER — VITAMIN B-12 1000 MCG PO TABS: 1000.0000 ug | ORAL_TABLET | Freq: Every day | ORAL | Status: DC

## 2024-06-17 MED ORDER — ACETAMINOPHEN 325 MG PO TABS: 325.0000 mg | ORAL_TABLET | Freq: Four times a day (QID) | ORAL | Status: DC | PRN

## 2024-06-17 MED ORDER — SODIUM CHLORIDE 0.9% FLUSH
3.0000 mL | Freq: Two times a day (BID) | INTRAVENOUS | Status: DC
  Administered 2024-06-17 – 2024-06-23 (×13): 3 mL via INTRAVENOUS

## 2024-06-17 MED ORDER — METOCLOPRAMIDE HCL 5 MG/ML IJ SOLN: 5.0000 mg | Freq: Three times a day (TID) | INTRAMUSCULAR | Status: DC | PRN

## 2024-06-17 MED ORDER — DROPERIDOL 2.5 MG/ML IJ SOLN: 0.6250 mg | Freq: Once | INTRAMUSCULAR | Status: DC | PRN

## 2024-06-17 MED ORDER — FENTANYL CITRATE (PF) 100 MCG/2ML IJ SOLN: 25.0000 ug | INTRAMUSCULAR | Status: DC | PRN

## 2024-06-17 MED ORDER — ACETAMINOPHEN 325 MG PO TABS
650.0000 mg | ORAL_TABLET | Freq: Once | ORAL | Status: AC
  Administered 2024-06-17: 650 mg via ORAL
  Filled 2024-06-17: qty 2

## 2024-06-17 MED ORDER — KETOROLAC TROMETHAMINE 15 MG/ML IJ SOLN
7.5000 mg | Freq: Four times a day (QID) | INTRAMUSCULAR | Status: AC
  Administered 2024-06-17 – 2024-06-18 (×4): 7.5 mg via INTRAVENOUS
  Filled 2024-06-17 (×4): qty 1

## 2024-06-17 MED ORDER — CYANOCOBALAMIN 1000 MCG/ML IJ SOLN
1000.0000 ug | Freq: Every day | INTRAMUSCULAR | Status: DC
  Administered 2024-06-17 – 2024-06-18 (×2): 1000 ug via INTRAMUSCULAR
  Filled 2024-06-17 (×2): qty 1

## 2024-06-17 MED ORDER — SODIUM CHLORIDE 0.9% FLUSH
3.0000 mL | Freq: Two times a day (BID) | INTRAVENOUS | Status: DC
Start: 2024-06-17 — End: 2024-06-17
  Administered 2024-06-17: 3 mL via INTRAVENOUS

## 2024-06-17 MED ORDER — CARBIDOPA-LEVODOPA 25-100 MG PO TABS
1.5000 | ORAL_TABLET | Freq: Three times a day (TID) | ORAL | Status: DC
Start: 2024-06-17 — End: 2024-06-23
  Administered 2024-06-17 – 2024-06-23 (×18): 1.5 via ORAL
  Filled 2024-06-17 (×3): qty 2
  Filled 2024-06-17: qty 1.5
  Filled 2024-06-17 (×4): qty 2
  Filled 2024-06-17: qty 1.5
  Filled 2024-06-17 (×10): qty 2

## 2024-06-17 MED ORDER — KETOROLAC TROMETHAMINE 30 MG/ML IJ SOLN
INTRAMUSCULAR | Status: DC | PRN
  Administered 2024-06-17: 30 mg via INTRAMUSCULAR

## 2024-06-17 MED ORDER — BUPROPION HCL ER (XL) 150 MG PO TB24
150.0000 mg | ORAL_TABLET | Freq: Every day | ORAL | Status: DC
  Administered 2024-06-17 – 2024-06-23 (×7): 150 mg via ORAL
  Filled 2024-06-17 (×7): qty 1

## 2024-06-17 MED ORDER — POLYETHYLENE GLYCOL 3350 17 G PO PACK
17.0000 g | PACK | Freq: Two times a day (BID) | ORAL | Status: DC
Start: 2024-06-17 — End: 2024-06-17
  Administered 2024-06-17: 17 g via ORAL
  Filled 2024-06-17: qty 1

## 2024-06-17 MED ORDER — CEFAZOLIN SODIUM-DEXTROSE 2-4 GM/100ML-% IV SOLN
2.0000 g | Freq: Four times a day (QID) | INTRAVENOUS | Status: AC
  Administered 2024-06-17 – 2024-06-18 (×2): 2 g via INTRAVENOUS
  Filled 2024-06-17 (×2): qty 100

## 2024-06-17 MED ORDER — BUPIVACAINE LIPOSOME 1.3 % IJ SUSP
INTRAMUSCULAR | Status: DC | PRN
  Administered 2024-06-17: 20 mL

## 2024-06-17 MED ORDER — METHOCARBAMOL 500 MG PO TABS
500.0000 mg | ORAL_TABLET | Freq: Four times a day (QID) | ORAL | Status: DC | PRN
  Administered 2024-06-23: 500 mg via ORAL
  Filled 2024-06-17: qty 1

## 2024-06-17 MED ORDER — ONDANSETRON HCL 4 MG/2ML IJ SOLN
INTRAMUSCULAR | Status: DC | PRN
  Administered 2024-06-17: 4 mg via INTRAVENOUS

## 2024-06-17 MED ORDER — ONDANSETRON HCL 4 MG/2ML IJ SOLN
INTRAMUSCULAR | Status: AC
  Filled 2024-06-17: qty 2

## 2024-06-17 MED ORDER — OXYCODONE HCL 5 MG PO TABS: 5.0000 mg | ORAL_TABLET | Freq: Once | ORAL | Status: DC | PRN

## 2024-06-17 MED ORDER — BUPIVACAINE HCL (PF) 0.5 % IJ SOLN
INTRAMUSCULAR | Status: DC | PRN
  Administered 2024-06-17: 2.8 mL

## 2024-06-17 MED ORDER — ACETAMINOPHEN 325 MG PO TABS: 650.0000 mg | ORAL_TABLET | Freq: Four times a day (QID) | ORAL | Status: DC | PRN

## 2024-06-17 MED ORDER — DOCUSATE SODIUM 100 MG PO CAPS
100.0000 mg | ORAL_CAPSULE | Freq: Two times a day (BID) | ORAL | Status: DC
Start: 2024-06-17 — End: 2024-06-23
  Administered 2024-06-17 – 2024-06-22 (×9): 100 mg via ORAL
  Filled 2024-06-17 (×9): qty 1

## 2024-06-17 SURGICAL SUPPLY — 57 items
BAG DECANTER FOR FLEXI CONT (MISCELLANEOUS) IMPLANT
BLADE SAGITTAL WIDE XTHICK NO (BLADE) ×1 IMPLANT
BLADE SURG SZ20 CARB STEEL (BLADE) ×1 IMPLANT
BOWL CEMENT MIXING ADV NOZZLE (MISCELLANEOUS) IMPLANT
CEMENT BONE 40GM (Cement) IMPLANT
CHLORAPREP W/TINT 26 (MISCELLANEOUS) ×2 IMPLANT
DRAPE IMP U-DRAPE 54X76 (DRAPES) ×1 IMPLANT
DRAPE INCISE IOBAN 66X60 STRL (DRAPES) ×1 IMPLANT
DRAPE SURG 17X11 SM STRL (DRAPES) ×1 IMPLANT
DRAPE SURG 17X23 STRL (DRAPES) ×1 IMPLANT
DRSG OPSITE POSTOP 4X10 (GAUZE/BANDAGES/DRESSINGS) IMPLANT
DRSG OPSITE POSTOP 4X12 (GAUZE/BANDAGES/DRESSINGS) IMPLANT
DRSG OPSITE POSTOP 4X8 (GAUZE/BANDAGES/DRESSINGS) IMPLANT
ELECT BLADE 6.5 EXT (BLADE) IMPLANT
ELECT CAUTERY BLADE 6.4 (BLADE) ×1 IMPLANT
ELECTRODE REM PT RTRN 9FT ADLT (ELECTROSURGICAL) ×1 IMPLANT
GAUZE PACK 2X3YD (PACKING) IMPLANT
GAUZE XEROFORM 1X8 LF (GAUZE/BANDAGES/DRESSINGS) ×1 IMPLANT
GLOVE BIO SURGEON STRL SZ7.5 (GLOVE) IMPLANT
GLOVE BIO SURGEON STRL SZ8 (GLOVE) ×3 IMPLANT
GLOVE BIOGEL PI IND STRL 8 (GLOVE) ×1 IMPLANT
GOWN STRL REUS W/ TWL LRG LVL3 (GOWN DISPOSABLE) ×1 IMPLANT
GOWN STRL REUS W/ TWL XL LVL3 (GOWN DISPOSABLE) ×1 IMPLANT
HEAD MOD COCR 28MM HD -6MM NK (Orthopedic Implant) IMPLANT
HOOD PEEL AWAY T7 (MISCELLANEOUS) ×3 IMPLANT
IV NS 100ML SINGLE PACK (IV SOLUTION) IMPLANT
KIT PREP HIP W/CEMENT RESTRICT (Miscellaneous) IMPLANT
LABEL OR SOLS (LABEL) ×1 IMPLANT
MANIFOLD NEPTUNE II (INSTRUMENTS) ×1 IMPLANT
NDL FILTER BLUNT 18X1 1/2 (NEEDLE) ×1 IMPLANT
NDL SAFETY ECLIP 18X1.5 (MISCELLANEOUS) ×1 IMPLANT
NDL SPNL 20GX3.5 QUINCKE YW (NEEDLE) ×1 IMPLANT
NEEDLE FILTER BLUNT 18X1 1/2 (NEEDLE) ×2 IMPLANT
NEEDLE SPNL 20GX3.5 QUINCKE YW (NEEDLE) ×1 IMPLANT
PACK HIP PROSTHESIS (MISCELLANEOUS) ×1 IMPLANT
SHELL RINGBLOC BI POLR 28X46MM (Orthopedic Implant) IMPLANT
SOL .9 NS 3000ML IRR UROMATIC (IV SOLUTION) ×2 IMPLANT
SOLN STERILE WATER 1000 ML (IV SOLUTION) ×1 IMPLANT
SOLN STERILE WATER BTL 1000 ML (IV SOLUTION) ×1 IMPLANT
SPIKE FLUID TRANSFER (MISCELLANEOUS) ×2 IMPLANT
SPONGE T-LAP 18X18 ~~LOC~~+RFID (SPONGE) ×2 IMPLANT
STAPLER SKIN PROX 35W (STAPLE) ×1 IMPLANT
STEM CENTRALIZER DISTAL SZ15 (Orthopedic Implant) IMPLANT
STEM FEM ECHO SZ13 150M T1 (Stem) IMPLANT
STRAP SAFETY 5IN WIDE (MISCELLANEOUS) ×1 IMPLANT
SUT TICRON 2-0 30IN 311381 (SUTURE) ×4 IMPLANT
SUT VIC AB 0 CT1 36 (SUTURE) ×1 IMPLANT
SUT VIC AB 1 CT1 36 (SUTURE) ×1 IMPLANT
SUT VIC AB 2-0 CT1 (SUTURE) ×2 IMPLANT
SUT VIC AB 2-0 CT1 TAPERPNT 27 (SUTURE) IMPLANT
SUTURE FIBERWR #2 38 BLUE 1/2 (SUTURE) IMPLANT
SYR 10ML LL (SYRINGE) ×1 IMPLANT
SYR 30ML LL (SYRINGE) ×3 IMPLANT
SYR TB 1ML LL NO SAFETY (SYRINGE) IMPLANT
TIP BRUSH PULSAVAC PLUS 24.33 (MISCELLANEOUS) IMPLANT
TIP FAN IRRIG PULSAVAC PLUS (DISPOSABLE) ×1 IMPLANT
TRAP FLUID SMOKE EVACUATOR (MISCELLANEOUS) ×2 IMPLANT

## 2024-06-17 NOTE — Anesthesia Procedure Notes (Signed)
 Spinal  Patient location during procedure: OR Start time: 06/17/2024 1:44 PM End time: 06/17/2024 2:04 PM Reason for block: surgical anesthesia Staffing Performed: anesthesiologist and resident/CRNA  Anesthesiologist: Vicci Camellia Glatter, MD Resident/CRNA: Veronica Alm BROCKS, CRNA Performed by: Vicci Camellia Glatter, MD Authorized by: Vicci Camellia Glatter, MD   Preanesthetic Checklist Completed: patient identified, IV checked, site marked, risks and benefits discussed, surgical consent, monitors and equipment checked, pre-op evaluation and timeout performed Spinal Block Patient position: left lateral decubitus Prep: DuraPrep Patient monitoring: heart rate, cardiac monitor, continuous pulse ox and blood pressure Approach: right paramedian Location: L3-4 Injection technique: single-shot Needle Needle type: Pencan  Needle gauge: 24 G Needle length: 9 cm Assessment Sensory level: T10 Events: CSF return and second provider Additional Notes CRNA student with first attempt, CRNA with second attempt. Third provider was MD Vicci. No paresthesias were noted. Pt was sedated per chart and tolerated procedure.

## 2024-06-17 NOTE — H&P (Signed)
 [facility] History and Physical   Patient: Michele Brewer FMW:[id number]   PCP: [name], MD DOB: 09-04-1951   DOA: 06/17/2024   DOS: 06/17/2024   DOS: the patient was seen and examined on 06/17/2024  Patient coming from: The patient is coming from Home  Chief Complaint: Fall and Right Hip Fracture   HPI: Michele Brewer is a 73 y.o. female with PMH of Parkinson disease, depression, as reviewed from EMR, presented to [facility] ED after mechanical fall yesterday at home.  Patient fell on her right side, complaining of severe right hip pain and unable to bear weight.  Patient also complaining of pain in her right wrist.  Denies any head injury. As per patient her pain is well-controlled now after getting pain medications.  Patient does not wanted blood transfusion because she is Jehov[employer]'s Witness  ED Course: VS stable, afebrile, HR 92, RR 18, BP 141/59, 98% on room air. CBC: Hb 11.9, no leukocytosis BMP: Glucose 210, BUN 25, rest within normal range  Right hip x-ray: Acute fracture of the proximal right femur.   Right wrist x-ray: 1. No acute fracture. 2. Dorsal positioning of the distal right ulna, Correlation with physical examination is recommended to determine the presence of an acute ligamentous injury.  Orthopedic surgery was consulted.   TRH consulted for admission and further management as below.  Review of Systems: as mentioned in the history of present illness.  All other systems reviewed and are negative.  Past Medical History:  Diagnosis Date   Depression    Elevated CK 06/03/2023   Parkinson disease [facility])    Past Surgical History:  Procedure Laterality Date   CESAREAN SECTION     Social History:  reports that she has never smoked. She has never used smokeless tobacco. She reports that she does not currently use alcohol . She reports that she does not use drugs.  Allergies  Allergen Reactions   Citrus     rash   Peanut-Containing Drug Products     Rash and  itch     Family history reviewed and not pertinent Family History  Problem Relation Age of Onset   Gout Mother    Heart disease Father      Prior to Admission medications   Medication Sig Start Date End Date Taking? Authorizing Provider  acetaminophen  (TYLENOL ) 325 MG tablet Take 2 tablets (650 mg total) by mouth every 6 (six) hours as needed for mild pain, moderate pain, fever or headache (or Fever >/= 101). 07/18/22   Josette Ade, MD  buPROPion  (WELLBUTRIN  XL) 150 MG 24 hr tablet Take 1 tablet (150 mg total) by mouth daily. 06/12/24     carbidopa -levodopa  (SINEMET  IR) 25-100 MG tablet Take 1.5 tablets by mouth 2 (two) times daily. 07/18/22   Josette Ade, MD  carbidopa -levodopa  (SINEMET  IR) 25-100 MG tablet Take 1.5 tablets by mouth at bedtime. 07/18/22   Josette Ade, MD  cyanocobalamin  1000 MCG tablet Take 1 tablet (1,000 mcg total) by mouth daily. 07/19/22   Josette Ade, MD  DULoxetine  (CYMBALTA ) 30 MG capsule Take 1 capsule (30 mg total) by mouth 2 (two) times daily. 11/22/22     fluticasone  (FLONASE ) 50 MCG/ACT nasal spray Place 2 sprays into both nostrils daily. 06/10/23   Fausto Burnard LABOR, DO  folic acid  (FOLVITE ) 1 MG tablet Take 1 tablet (1 mg total) by mouth daily. 07/19/22   Josette Ade, MD  gabapentin  (NEURONTIN ) 100 MG capsule Take 100 mg by mouth 3 (three) times daily. 09/08/19  [provider]  gabapentin  (NEURONTIN ) 100 MG capsule Take 1 capsule (100 mg total) by mouth 3 (three) times daily. 11/22/22     meclizine  (ANTIVERT ) 25 MG tablet Take 1 tablet (25 mg total) by mouth 3 (three) times daily as needed for dizziness. 06/09/23   Fausto Burnard LABOR, DO  Multiple Vitamin (MULTIVITAMIN WITH MINERALS) TABS tablet Take 1 tablet by mouth daily. 07/19/22   Josette Ade, MD  Nutritional Supplements (FEEDING SUPPLEMENT, NEPRO CARB STEADY,) LIQD Take 237 mLs by mouth 3 (three) times daily between meals. 07/18/22   Josette Ade, MD  polyethylene  glycol (MIRALAX  / GLYCOLAX ) 17 g packet Take 17 g by mouth daily as needed for moderate constipation. 07/18/22   Josette Ade, MD  thiamine  (VITAMIN B-1) 100 MG tablet Take 1 tablet (100 mg total) by mouth daily. 07/19/22   Josette Ade, MD  Vitamin D , Ergocalciferol , (DRISDOL ) 1.25 MG (50000 UNIT) CAPS capsule Take 1 capsule (50,000 Units total) by mouth every 7 (seven) days. 07/23/22   Josette Ade, MD  Vitamin D , Ergocalciferol , (DRISDOL ) 1.25 MG (50000 UNIT) CAPS capsule Take 1 capsule (50,000 Units total) by mouth once a week. 06/06/24       Physical Exam: Vitals:   06/17/24 0039 06/17/24 0040 06/17/24 0612 06/17/24 0730  BP:  (!) 141/59 121/71 110/67  Pulse:  92 78 74  Resp:  18 16 18   Temp:  98.6 F (37 C) 97.6 F (36.4 C)   TempSrc:   Oral   SpO2:  98% 99% 98%  Weight: 68 kg     Height: 5' 4 (1.626 m)       General: alert and oriented to time, place, and person. Appear in mild distress, affect appropriate Eyes: PERRLA, Conjunctiva normal ENT: Oral Mucosa Clear, moist  Neck: no JVD, no Abnormal Mass Or lumps Cardiovascular: S1 and S2 Present, no Murmur, peripheral pulses symmetrical Respiratory: good respiratory effort, Bilateral Air entry equal and Decreased, no signs of accessory muscle use, Clear to Auscultation, no Crackles, no wheezes Abdomen: Bowel Sound present, Soft and no tenderness, no hernia Skin: no rashes  Extremities: no Pedal edema, no calf tenderness Neurologic: without any new focal findings Gait not checked due to patient safety concerns  Data Reviewed: I have personally reviewed and interpreted labs, imaging as discussed below.  CBC: Recent Labs  Lab 06/17/24 0219  WBC 7.2  NEUTROABS 6.4  HGB 11.9*  HCT 33.4*  MCV 93.0  PLT 167   Basic Metabolic Panel: Recent Labs  Lab 06/17/24 0219  NA 138  K 4.0  CL 101  CO2 26  GLUCOSE 110*  BUN 25*  CREATININE 0.78  CALCIUM 9.4   GFR: Estimated Creatinine Clearance: 58.4 mL/min (by  C-G formula based on SCr of 0.78 mg/dL). Liver Function Tests: Recent Labs  Lab 06/17/24 0219  AST 28  ALT 18  ALKPHOS 98  BILITOT 1.1  PROT 7.5  ALBUMIN 3.7   No results for input(s): LIPASE, AMYLASE in the last 168 hours. No results for input(s): AMMONIA in the last 168 hours. Coagulation Profile: No results for input(s): INR, PROTIME in the last 168 hours. Cardiac Enzymes: No results for input(s): CKTOTAL, CKMB, CKMBINDEX, TROPONINI in the last 168 hours. BNP (last 3 results) No results for input(s): PROBNP in the last 8760 hours. HbA1C: No results for input(s): HGBA1C in the last 72 hours. CBG: No results for input(s): GLUCAP in the last 168 hours. Lipid Profile: No results for input(s): CHOL, HDL, LDLCALC, TRIG,  CHOLHDL, LDLDIRECT in the last 72 hours. Thyroid  Function Tests: No results for input(s): TSH, T4TOTAL, FREET4, T3FREE, THYROIDAB in the last 72 hours. Anemia Panel: No results for input(s): VITAMINB12, FOLATE, FERRITIN, TIBC, IRON, RETICCTPCT in the last 72 hours. Urine analysis:    Component Value Date/Time   COLORURINE YELLOW (A) 06/06/2023 1124   APPEARANCEUR CLOUDY (A) 06/06/2023 1124   LABSPEC 1.013 06/06/2023 1124   PHURINE 8.0 06/06/2023 1124   GLUCOSEU NEGATIVE 06/06/2023 1124   HGBUR SMALL (A) 06/06/2023 1124   BILIRUBINUR NEGATIVE 06/06/2023 1124   KETONESUR 5 (A) 06/06/2023 1124   PROTEINUR 30 (A) 06/06/2023 1124   NITRITE NEGATIVE 06/06/2023 1124   LEUKOCYTESUR MODERATE (A) 06/06/2023 1124    Radiological Exams on Admission: DG Hip Unilat W or Wo Pelvis 2-3 Views Right Result Date: 06/17/2024 CLINICAL DATA:  Status post fall. EXAM: DG HIP (WITH OR WITHOUT PELVIS) 2-3V RIGHT COMPARISON:  None Available. FINDINGS: An acute fracture deformity is seen extending through the junction of the head and neck of the proximal right femur. Approximately 1/2 with dorsal displacement of the distal  fracture site is noted. There is no evidence of dislocation. Degenerative changes are seen involving both hips, as well as the visualized portion of the lower lumbar spine. IMPRESSION: Acute fracture of the proximal right femur. Electronically Signed   By: Suzen Dials M.D.   On: 06/17/2024 01:41   DG Wrist Complete Right Result Date: 06/17/2024 CLINICAL DATA:  Status post fall. EXAM: RIGHT WRIST - COMPLETE 3+ VIEW COMPARISON:  None Available. FINDINGS: There is no evidence of an acute fracture. The distal right ulna is dorsal in position with respect to the distal right radius on the lateral view. This is of indeterminate age. Chronic and degenerative changes seen along the posterior aspect of the right wrist with additional degenerative changes present at the first right carpometacarpal joint. Soft tissues are unremarkable. IMPRESSION: 1. No acute fracture. 2. Dorsal positioning of the distal right ulna, as described above. Correlation with physical examination is recommended to determine the presence of an acute ligamentous injury. Electronically Signed   By: Suzen Dials M.D.   On: 06/17/2024 01:39   EKG: Independently reviewed. normal EKG, normal sinus rhythm.  I reviewed all nursing notes, pharmacy notes, vitals, pertinent old records.  Assessment/Plan Principal Problem:   Closed right hip fracture (HCC)   # Displaced right femoral neck fracture  S/p mechanical fall at home  Continue prn meds for pain control Continue fall precautions Orthopedic surgery consulted, patient will be taken to the OR today   # Right wrist sprain with possible distal radio-ulnar joint subluxation.  Continue Velcro splint as per orthopedics Dr. Edie will  review the patient's x-rays and examination findings with Dr. Ezra, our hand and upper extremity expert, to see if he has any additional specific treatment recommendations.   Follow orthopedic surgery Continue prn pain meds  # Iron deficiency,  Tsat 7%, Venofer 3 mg IV x 1 dose order placed Start oral iron supplement with vitamin C  for discharge F/u with PCP to repeat iron profile after 3 to 6 months  # Vitamin B12 deficiency: B12 level 211, goal >400.   Started vitamin B12 1000 mcg IM injection daily during hospital stay, followed by oral supplement.  Follow-up PCP to repeat vitamin B12 level after 3 to 6 months.  # Folic acid  level 5.4 at lower end, started folic acid  1 mg p.o. daily to prevent deficiency.  # Vitamin D  insufficiency: started  vitamin D  50,000 units p.o. weekly, follow with PCP to repeat vitamin D  level after 3 to 6 months.  # Parkinson disease: Continued Sinemet  home dose # Depression: Continued Wellbutrin  and Cymbalta  at home dose  Nutrition: Cardiac diet DVT Prophylaxis: Subcutaneous Lovenox   Advance goals of care discussion: Full code   Consults: Orthopedic surgery  Family Communication: family was present at bedside, at the time of interview.  Opportunity was given to ask question and all questions were answered satisfactorily.  Disposition: Admitted as inpatient, med surge telemetry bed. Likely to be discharged SNF, in few days when stable and cleared by Ortho.  I have discussed plan of care as described above with RN and patient/family.  Severity of Illness: The appropriate patient status for this patient is INPATIENT. Inpatient status is judged to be reasonable and necessary in order to provide the required intensity of service to ensure the patient's safety. The patient's presenting symptoms, physical exam findings, and initial radiographic and laboratory data in the context of their chronic comorbidities is felt to place them at high risk for further clinical deterioration. Furthermore, it is not anticipated that the patient will be medically stable for discharge from the hospital within 2 midnights of admission.   * I certify that at the point of admission it is my clinical judgment that the  patient will require inpatient hospital care spanning beyond 2 midnights from the point of admission due to high intensity of service, high risk for further deterioration and high frequency of surveillance required.*   Author: ELVAN SOR, MD [facility] 06/17/2024 8:39 AM   To reach On-call, see care teams to locate the attending and reach out to them via [email or web address]. If 7PM-7AM, please contact night-coverage If you still have difficulty reaching the attending provider, please page the [facility] (Director on Call) for [facility] on amion for assistance.

## 2024-06-17 NOTE — ED Triage Notes (Signed)
 Pt to ED via EMS from home, pt had witnessed mechanical fall earlier today, pt initially didn't want to be seen but has had inc in right hip pain and r wrist pain. Pt denies hitting head, hx parkinsons

## 2024-06-17 NOTE — Consult Note (Signed)
 ORTHOPAEDIC CONSULTATION  REQUESTING PHYSICIAN: Von Bellis, MD  Chief Complaint:   Right hip pain.  History of Present Illness: Michele Brewer is a 73 y.o. female with a history of Parkinson's disease and depression who normally lives with her mother and sister and ambulates with a cane for balance and support.  However, she notes that she has been falling more frequently lately which she attributes to her Parkinson's disease.  Apparently, the patient was in her usual state of health yesterday when she lost her balance and fell onto her right side.  Initially, she declined transportation to the emergency room, but because of worsening pain and inability to ambulate, she was brought to the emergency room where x-rays demonstrated a displaced right femoral neck fracture.  The patient also fell and injured her right wrist.  The patient denies any other associated injuries.  She did not strike her head or lose consciousness.  The patient also denies any lightheadedness, dizziness, chest pain, shortness of breath, or other symptoms which may have precipitated her fall.  Past Medical History:  Diagnosis Date   Depression    Elevated CK 06/03/2023   Parkinson disease [facility])    Past Surgical History:  Procedure Laterality Date   CESAREAN SECTION     Social History   Socioeconomic History   Marital status: Widowed    Spouse name: Not on file   Number of children: Not on file   Years of education: Not on file   Highest education level: Not on file  Occupational History   Not on file  Tobacco Use   Smoking status: Never   Smokeless tobacco: Never  Substance and Sexual Activity   Alcohol  use: Not Currently   Drug use: Never   Sexual activity: Not on file  Other Topics Concern   Not on file  Social History Narrative   Not on file   Social Drivers of Health   Financial Resource Strain: Low Risk  (06/12/2024)   Received  from [facility]   Overall Financial Resource Strain (CARDIA)    Difficulty of Paying Living Expenses: Not hard at all  Food Insecurity: No Food Insecurity (06/12/2024)   Received from [facility]   Hunger Vital Sign    Within the past 12 months, you worried that your food would run out before you got the money to buy more.: Never true    Within the past 12 months, the food you bought just didn't last and you didn't have money to get more.: Never true  Transportation Needs: No Transportation Needs (06/12/2024)   Received [facility]    In the past 12 months, has lack of transportation kept you from medical appointments or from getting medications?: No    Lack of Transportation (Non-Medical): No  Physical Activity: Not on file  Stress: Not on file  Social Connections: Not on file   Family History  Problem Relation Age of Onset   Gout Mother    Heart disease Father    Allergies  Allergen Reactions   Citrus     rash   Peanut-Containing Drug Products     Rash and itch   Prior to Admission medications   Medication Sig Start Date End Date Taking? Authorizing Provider  acetaminophen  (TYLENOL ) 325 MG tablet Take 2 tablets (650 mg total) by mouth every 6 (six) hours as needed for mild pain, moderate pain, fever or headache (or Fever >/= 101).  07/18/22   Josette Ade, MD  buPROPion  (WELLBUTRIN  XL) 150 MG 24 hr tablet Take 1 tablet (150 mg total) by mouth daily. 06/12/24     carbidopa -levodopa  (SINEMET  IR) 25-100 MG tablet Take 1.5 tablets by mouth 2 (two) times daily. 07/18/22   Josette Ade, MD  carbidopa -levodopa  (SINEMET  IR) 25-100 MG tablet Take 1.5 tablets by mouth at bedtime. 07/18/22   Josette Ade, MD  cyanocobalamin  1000 MCG tablet Take 1 tablet (1,000 mcg total) by mouth daily. 07/19/22   Josette Ade, MD  DULoxetine  (CYMBALTA ) 30 MG capsule Take 1 capsule (30 mg total) by mouth 2  (two) times daily. 11/22/22     fluticasone  (FLONASE ) 50 MCG/ACT nasal spray Place 2 sprays into both nostrils daily. 06/10/23   Fausto Burnard LABOR, DO  folic acid  (FOLVITE ) 1 MG tablet Take 1 tablet (1 mg total) by mouth daily. 07/19/22   Josette Ade, MD  gabapentin  (NEURONTIN ) 100 MG capsule Take 100 mg by mouth 3 (three) times daily. 09/08/19   [provider]  gabapentin  (NEURONTIN ) 100 MG capsule Take 1 capsule (100 mg total) by mouth 3 (three) times daily. 11/22/22     meclizine  (ANTIVERT ) 25 MG tablet Take 1 tablet (25 mg total) by mouth 3 (three) times daily as needed for dizziness. 06/09/23   Fausto Burnard LABOR, DO  Multiple Vitamin (MULTIVITAMIN WITH MINERALS) TABS tablet Take 1 tablet by mouth daily. 07/19/22   Josette Ade, MD  Nutritional Supplements (FEEDING SUPPLEMENT, NEPRO CARB STEADY,) LIQD Take 237 mLs by mouth 3 (three) times daily between meals. 07/18/22   Josette Ade, MD  polyethylene glycol (MIRALAX  / GLYCOLAX ) 17 g packet Take 17 g by mouth daily as needed for moderate constipation. 07/18/22   Josette Ade, MD  thiamine  (VITAMIN B-1) 100 MG tablet Take 1 tablet (100 mg total) by mouth daily. 07/19/22   Josette Ade, MD  Vitamin D , Ergocalciferol , (DRISDOL ) 1.25 MG (50000 UNIT) CAPS capsule Take 1 capsule (50,000 Units total) by mouth every 7 (seven) days. 07/23/22   Josette Ade, MD  Vitamin D , Ergocalciferol , (DRISDOL ) 1.25 MG (50000 UNIT) CAPS capsule Take 1 capsule (50,000 Units total) by mouth once a week. 06/06/24      DG Hip Unilat W or Wo Pelvis 2-3 Views Right Result Date: 06/17/2024 CLINICAL DATA:  Status post fall. EXAM: DG HIP (WITH OR WITHOUT PELVIS) 2-3V RIGHT COMPARISON:  None Available. FINDINGS: An acute fracture deformity is seen extending through the junction of the head and neck of the proximal right femur. Approximately 1/2 with dorsal displacement of the distal fracture site is noted. There is no evidence of dislocation. Degenerative  changes are seen involving both hips, as well as the visualized portion of the lower lumbar spine. IMPRESSION: Acute fracture of the proximal right femur. Electronically Signed   By: Suzen Dials M.D.   On: 06/17/2024 01:41   DG Wrist Complete Right Result Date: 06/17/2024 CLINICAL DATA:  Status post fall. EXAM: RIGHT WRIST - COMPLETE 3+ VIEW COMPARISON:  None Available. FINDINGS: There is no evidence of an acute fracture. The distal right ulna is dorsal in position with respect to the distal right radius on the lateral view. This is of indeterminate age. Chronic and degenerative changes seen along the posterior aspect of the right wrist with additional degenerative changes present at the first right carpometacarpal joint. Soft tissues are unremarkable. IMPRESSION: 1. No acute fracture. 2. Dorsal positioning of the distal right ulna, as described above. Correlation with physical examination is recommended  to determine the presence of an acute ligamentous injury. Electronically Signed   By: Suzen Dials M.D.   On: 06/17/2024 01:39    Positive ROS: All other systems have been reviewed and were otherwise negative with the exception of those mentioned in the HPI and as above.  Physical Exam: General:  Alert, no acute distress Psychiatric:  Patient is competent for consent with normal mood and affect   Cardiovascular:  No pedal edema Respiratory:  No wheezing, non-labored breathing GI:  Abdomen is soft and non-tender Skin:  No lesions in the area of chief complaint Neurologic:  Sensation intact distally Lymphatic:  No axillary or cervical lymphadenopathy  Orthopedic Exam:  Orthopedic examination is limited to right hip and lower extremity.  The right hip is somewhat shortened and externally rotated as compared to the left.  Skin inspection of the right hip is unremarkable.  No swelling, erythema, ecchymosis, abrasions, or other skin abnormalities are identified.  She has mild tenderness to  palpation over the anterolateral aspect of the right hip.  She has more severe pain with any attempted active passive motion of the hip.  She is grossly neurovascular intact to the right lower extremity and foot.  Orthopedic examination also was limited to the right wrist and hand.  She experiences mild tenderness to palpation over the distal ulnar region, but no significant swelling or ecchymosis is noted in this area.  She is able to dorsiflex and plantarflex her wrist fully without any pain or catching.  She also is able to supinate and pronate fully without any catching or significant discomfort.  She is neurovascularly intact to all digits of her right hand.  X-rays:  Recent x-rays of the pelvis and right hip are available for review and have been reviewed by myself.  The findings are as described above.  There is a displaced subcapital fracture of the right femoral head.  No significant degenerative changes of the right hip joint are noted.  No lytic lesions or other acute bony abnormalities are identified.  Recent x-rays of the right wrist also are available for review and have been reviewed by myself.  The findings are as described above.  The distal ulna does appear to be dorsally displaced in relation to the rest of the carpus.  No fractures or acute bony processes are identified.  Assessment: 1.  Displaced right femoral neck fracture. 2.  Right wrist sprain with possible distal radio-ulnar joint subluxation.  Plan: The treatment options as a pertains to her right hip fracture, including both surgical and nonsurgical choices, have been discussed in detail with the patient.  The patient would like to proceed with surgical intervention to include a right hip hemiarthroplasty.  The risks (including bleeding, infection, nerve and/or blood vessel injury, persistent or recurrent pain, loosening or failure of the components, leg length inequality, dislocation, need for further surgery, blood clots,  strokes, heart attacks or arrhythmias, pneumonia, etc.) and benefits of the surgical procedure were discussed.  The patient states her understanding and agrees to proceed.  A formal written consent will be obtained by the nursing staff.  Regarding her right wrist, she will remain in her Velcro splint at this time.  I will review the patient's x-rays and examination findings with Dr. Ezra, our hand and upper extremity expert, to see if he has any additional specific treatment recommendations.    Thank you for asking me to participate in the care of this most pleasant unfortunate woman.  I will be  happy to follow her with you.   DOROTHA Reyes Maltos, MD  Beeper #:  [phone]  06/17/2024 7:44 AM

## 2024-06-17 NOTE — Op Note (Signed)
 06/17/2024  4:16 PM  Patient:   Michele Brewer  Pre-Op Diagnosis:   Displaced femoral neck fracture, right hip.  Post-Op Diagnosis:   Same.  Procedure:   Right hip bipolar hemiarthroplasty.  Surgeon:   DOROTHA Reyes Maltos, MD  Assistant:   Gustavo Level, PA-C; Dozier Cuba, PA-S  Anesthesia:   Spinal  Findings:   As above.  Complications:   None  EBL:   150 cc  Fluids:   600 cc crystalloid  UOP:   Incontinent  TT:   None  Drains:   None  Closure:   Staples  Implants:   Biomet Echo system with a #13 laterally offset cemented femoral stem, a 46 mm outer diameter shell, and a 28 mm head with a -6 mm neck adapter.  Brief Clinical Note:   The patient is a 73 year old female who fell yesterday afternoon onto her right side, injuring her right hip. The patient was brought to the emergency room where x-rays demonstrated the above-noted injury. The patient has been cleared medically and presents at this time for definitive management of the injury.  Procedure:   The patient was brought into the operating room and lain in the supine position. After adequate spinal anesthesia was obtained, the patient was repositioned in the left lateral lateral decubitus position and secured using a lateral hip positioner. The bony prominences along the lower part of the leg were properly padded and an axillary roll was utilized. The right hip and lower extremity were prepped with ChloroPrep solution before being draped sterilely. Preoperative antibiotics were administered. A timeout was performed to verify the appropriate surgical site.    A standard posterior approach to the hip was made through an approximately 4-5 inch incision. The incision was carried down through the subcutaneous tissues to expose the gluteal fascia and proximal end of the iliotibial band. These structures were split the length of the incision and the Charnley self-retaining hip retractor placed. The bursal tissues were swept  posteriorly to expose the short external rotators. The anterior border of the piriformis tendon was identified and this plane developed down through the capsule to enter the joint. Abundant fracture hematoma was suctioned. A flap of tissue was elevated off the posterior aspect of the femoral neck and greater trochanter and retracted posteriorly. This flap included the piriformis tendon, the short external rotators, and the posterior capsule. The femoral head was removed in its entirety, then taken to the back table where it was measured and found to be optimally replicated by a 46 mm head. The appropriate trial head was inserted and found to demonstrate an excellent suction fit.   Attention was directed to the femoral side. The femoral neck was recut 10-12 mm above the lesser trochanter using an oscillating saw. The piriformis fossa was debrided of soft tissues before the intramedullary canal was accessed through this point using a triple step reamer. The canal was reamed sequentially beginning with a #7 tapered reamer and progressing to a #15 tapered reamer. This provided excellent circumferential chatter. A box osteotome was used to establish version before the canal was broached sequentially beginning with a #12 broach and progressing to a #14 broach. This was left in place and several trial reductions performed using the standard and laterally offset neck lengths as well as the -6 mm neck adapter. The laterally offset stem with the -6 mm neck adapter demonstrated excellent stability both in extension and external rotation as well as with flexion to 90 degrees and internal rotation  beyond 65 degrees. She also was stable in the position of sleep.   The femoral canal was prepared for cementing by using a bottle brush to remove any residual debris, then irrigating the canal thoroughly. The distal cement restrictor was placed to the appropriate depth before the canal was packed with a Neo-Synephrine soaked gauze.  Meanwhile, the cement was being mixed on the back table. When it was ready, it was injected into the femoral canal using proper third-generation synthetic cement technique and pressurization. The permanent # 13 offset cemented femoral stem was inserted with care taken to maintain the appropriate version, then held in place until the cement hardened. The excess cement was removed using Freer elevators and curettes.  A repeat trial reduction was performed using the -6 mm neck length. The -6 mm neck length demonstrated excellent stability both in extension and external rotation as well as with flexion to 90 and internal rotation beyond 60. It also was stable in the position of sleep. The 46 mm outer diameter shell with the 28 mm head and -6 mm neck adapter construct was put together on the back table before being impacted onto the stem of the femoral component. The Morse taper locking mechanism was verified using manual distraction before the head was relocated and the hip placed through a range of motion with the findings as described above.  The wound was copiously irrigated with sterile saline solution via the jet lavage system before the peri-incisional and pericapsular tissues were injected with a cocktail of 20 cc of Exparel, 30 cc of 0.5% Sensorcaine, 2 cc of Kenalog 40 (80 mg), and 30 mg of Toradol diluted out to 60 cc with normal saline to help with postoperative analgesia. The posterior flap was reapproximated to the posterior aspect of the greater trochanter using #2 Tycron interrupted sutures placed through drill holes. The iliotibial band was reapproximated using #1 Vicryl interrupted sutures before the gluteal fascia was closed using a running #0 Vicryl suture. The subcutaneous tissues were closed in several layers using 2-0 Vicryl interrupted sutures before the skin was closed using staples. A sterile occlusive dressing was applied to the wound. The patient then was rolled back into the supine  position on the hospital bed before she was awakened, extubated, and returned to the recovery room in satisfactory condition after tolerating the procedure well.

## 2024-06-17 NOTE — Anesthesia Preprocedure Evaluation (Addendum)
 Anesthesia Evaluation  Patient identified by MRN, date of birth, ID band Patient awake    Reviewed: Allergy & Precautions, H&P , NPO status , Patient's Chart, lab work & pertinent test results  Airway Mallampati: II  TM Distance: >3 FB Neck ROM: full    Dental  (+) Chipped   Pulmonary neg pulmonary ROS   Pulmonary exam normal        Cardiovascular negative cardio ROS Normal cardiovascular exam     Neuro/Psych  PSYCHIATRIC DISORDERS  Depression    Parkinson disease  speech is affected on some days  Neuromuscular disease    GI/Hepatic negative GI ROS, Neg liver ROS,,,  Endo/Other  negative endocrine ROS    Renal/GU      Musculoskeletal   Abdominal Normal abdominal exam  (+)   Peds  Hematology  (+) Blood dyscrasia, anemia   Anesthesia Other Findings R arm tremor  Past Medical History: No date: Depression 06/03/2023: Elevated CK No date: Parkinson disease (HCC)  Past Surgical History: No date: CESAREAN SECTION  BMI    Body Mass Index: 25.75 kg/m      Reproductive/Obstetrics negative OB ROS                              Anesthesia Physical Anesthesia Plan  ASA: 3  Anesthesia Plan: Spinal   Post-op Pain Management:    Induction:   PONV Risk Score and Plan: Propofol infusion  Airway Management Planned: Natural Airway  Additional Equipment:   Intra-op Plan:   Post-operative Plan:   Informed Consent: I have reviewed the patients History and Physical, chart, labs and discussed the procedure including the risks, benefits and alternatives for the proposed anesthesia with the patient or authorized representative who has indicated his/her understanding and acceptance.     Dental Advisory Given  Plan Discussed with: CRNA and Surgeon  Anesthesia Plan Comments:          Anesthesia Quick Evaluation

## 2024-06-17 NOTE — ED Notes (Signed)
 Called CCMD to place patient on monitor

## 2024-06-17 NOTE — ED Provider Notes (Signed)
 [facility] Provider Note    Event Date/Time   First MD Initiated Contact with Patient 06/17/24 [phone]     (approximate)   History   Chief Complaint: Fall   HPI  Michele Brewer is a 73 y.o. female with a history of Parkinson's disease who had a mechanical fall yesterday at home, resulting in severe right hip pain, unable to bear weight.  Also has pain in the right wrist.  Denies head injury.  Was in her usual state of health previously.  Last took her carbidopa  on 06/15/2024.        Past Medical History:  Diagnosis Date   Depression    Elevated CK 06/03/2023   Parkinson disease [facility])     Current Outpatient Rx   Order #: [id number] Class: OTC   Order #: [id number] Class: Normal   Order #: [id number] Class: No Print   Order #: [id number] Class: No Print   Order #: [id number] Class: Print   Order #: [id number] Class: Normal   Order #: [id number] Class: OTC   Order #: [id number] Class: Print   Order #: [id number] Class: Historical Med   Order #: [id number] Class: Normal   Order #: [id number] Class: Normal   Order #: [id number] Class: Print   Order #: [id number] Class: Print   Order #: [id number] Class: Print   Order #: [id number] Class: Print   Order #: [id number] Class: Print   Order #: [id number] Class: Normal    Past Surgical History:  Procedure Laterality Date   CESAREAN SECTION      Physical Exam   Triage Vital Signs: ED Triage Vitals  Encounter Vitals Group     BP 06/17/24 0040 (!) 141/59     Girls Systolic BP Percentile --      Girls Diastolic BP Percentile --      Boys Systolic BP Percentile --      Boys Diastolic BP Percentile --      Pulse Rate 06/17/24 0040 92     Resp 06/17/24 0040 18     Temp 06/17/24 0040 98.6 F (37 C)     Temp src --      SpO2 06/17/24 0040 98 %     Weight 06/17/24 0039 150 lb (68 kg)     Height 06/17/24 0039 5' 4 (1.626 m)     Head Circumference --      Peak Flow --      Pain Score 06/17/24 0039 7     Pain Loc --      Pain  Education --      Exclude from Growth Chart --     Most recent vital signs: Vitals:   06/17/24 0040  BP: (!) 141/59  Pulse: 92  Resp: 18  Temp: 98.6 F (37 C)  SpO2: 98%    General: Awake, no distress.  CV:  Good peripheral perfusion.  Regular rate rhythm Resp:  Normal effort.  Abd:  No distention.  Other:  Head atraumatic.  Right wrist shows dorsal displacement of the right ulna with tenderness in this area.  There is no wound.  Right leg is shortened with tenderness at the right hip.   ED Results / Procedures / Treatments   Labs (all labs ordered are listed, but only abnormal results are displayed) Labs Reviewed  COMPREHENSIVE METABOLIC PANEL WITH GFR  CBC WITH DIFFERENTIAL/PLATELET  TYPE AND SCREEN     EKG    RADIOLOGY X-ray right hip interpreted by me, shows fracture of the right femoral neck/subcapital fracture with displacement.  Radiology report reviewed X-ray right  wrist shows dorsal displacement of the distal ulna suspicious for ligamentous injury   PROCEDURES:  Procedures   MEDICATIONS ORDERED IN ED: Medications  0.9 %  sodium chloride  infusion ( Intravenous New Bag/Given 06/17/24 0217)  ceFAZolin  (ANCEF ) IVPB 2g/100 mL premix (has no administration in time range)  acetaminophen  (TYLENOL ) tablet 650 mg (650 mg Oral Given 06/17/24 0131)  morphine  (PF) 4 MG/ML injection 4 mg (4 mg Intravenous Given 06/17/24 0219)     IMPRESSION / MDM / ASSESSMENT AND PLAN / ED COURSE  I reviewed the triage vital signs and the nursing notes.  DDx: Hip fracture, right wrist ligament injury, aggravated Parkinson's disease, AKI, electrolyte derangement, dehydration, UTI  Patient's presentation is most consistent with acute presentation with potential threat to life or bodily function.  Patient presents with right hip pain after mechanical fall, possibly due to increased Parkinson symptoms from medication nonadherence.  X-ray shows a right femoral neck fracture.   Discussed with orthopedics Dr. Edie who will plan for operative repair.  Will keep n.p.o., start maintenance IV fluids.  IV morphine  for pain control.  Suspect ligament rupture of the right wrist causing displacement of the right ulna.  Will apply wrist brace for now.       FINAL CLINICAL IMPRESSION(S) / ED DIAGNOSES   Final diagnoses:  Fracture, proximal femur, right, closed, initial encounter (HCC)  Parkinson's disease with dyskinesia, unspecified whether manifestations fluctuate (HCC)  Rupture of ligament of right wrist     Rx / DC Orders   ED Discharge Orders     None        Note:  This document was prepared using Dragon voice recognition software and may include unintentional dictation errors.   Viviann Pastor, MD 06/17/24 [phone]

## 2024-06-17 NOTE — ED Notes (Signed)
 Patient placed in gown and pillow provided.

## 2024-06-17 NOTE — Transfer of Care (Signed)
 Immediate Anesthesia Transfer of Care Note  Patient: Michele Brewer  Procedure(s) Performed: HEMIARTHROPLASTY (BIPOLAR) HIP, POSTERIOR APPROACH FOR FRACTURE (Right)  Patient Location: PACU  Anesthesia Type:General and Spinal  Level of Consciousness: drowsy  Airway & Oxygen Therapy: Patient Spontanous Breathing  Post-op Assessment: Report given to RN and Post -op Vital signs reviewed and stable  Post vital signs: Reviewed and stable  Last Vitals:  Vitals Value Taken Time  BP 91/49 06/17/24 16:17  Temp    Pulse 76 06/17/24 16:20  Resp 15 06/17/24 16:20  SpO2 96 % 06/17/24 16:20  Vitals shown include unfiled device data.  Last Pain:  Vitals:   06/17/24 1240  TempSrc: Temporal  PainSc: 2       Patients Stated Pain Goal: 0 (06/17/24 1240)  Complications: No notable events documented.

## 2024-06-17 NOTE — ED Notes (Signed)
 Informed RN bed assigned

## 2024-06-18 ENCOUNTER — Encounter: Payer: Self-pay | Admitting: Surgery

## 2024-06-18 DIAGNOSIS — S72001A Fracture of unspecified part of neck of right femur, initial encounter for closed fracture: Secondary | ICD-10-CM | POA: Diagnosis not present

## 2024-06-18 DIAGNOSIS — G20B1 Parkinson's disease with dyskinesia, without mention of fluctuations: Secondary | ICD-10-CM

## 2024-06-18 DIAGNOSIS — S63301A Traumatic rupture of unspecified ligament of right wrist, initial encounter: Secondary | ICD-10-CM

## 2024-06-18 LAB — CBC
HCT: 31.6 % — ABNORMAL LOW (ref 36.0–46.0)
Hemoglobin: 11.3 g/dL — ABNORMAL LOW (ref 12.0–15.0)
MCH: 34.1 pg — ABNORMAL HIGH (ref 26.0–34.0)
MCHC: 35.8 g/dL (ref 30.0–36.0)
MCV: 95.5 fL (ref 80.0–100.0)
Platelets: 147 K/uL — ABNORMAL LOW (ref 150–400)
RBC: 3.31 MIL/uL — ABNORMAL LOW (ref 3.87–5.11)
RDW: 13 % (ref 11.5–15.5)
WBC: 7.7 K/uL (ref 4.0–10.5)
nRBC: 0 % (ref 0.0–0.2)

## 2024-06-18 LAB — URINALYSIS, W/ REFLEX TO CULTURE (INFECTION SUSPECTED)
Bacteria, UA: NONE SEEN
Bilirubin Urine: NEGATIVE
Glucose, UA: NEGATIVE mg/dL
Ketones, ur: 5 mg/dL — AB
Leukocytes,Ua: NEGATIVE
Nitrite: NEGATIVE
Protein, ur: NEGATIVE mg/dL
Specific Gravity, Urine: 1.031 — ABNORMAL HIGH (ref 1.005–1.030)
pH: 5 (ref 5.0–8.0)

## 2024-06-18 LAB — BASIC METABOLIC PANEL WITH GFR
Anion gap: 5 (ref 5–15)
BUN: 21 mg/dL (ref 8–23)
CO2: 25 mmol/L (ref 22–32)
Calcium: 8.7 mg/dL — ABNORMAL LOW (ref 8.9–10.3)
Chloride: 108 mmol/L (ref 98–111)
Creatinine, Ser: 0.72 mg/dL (ref 0.44–1.00)
GFR, Estimated: >60 mL/min (ref >60)
Glucose, Bld: 118 mg/dL — ABNORMAL HIGH (ref 70–99)
Potassium: 4.5 mmol/L (ref 3.5–5.1)
Sodium: 138 mmol/L (ref 135–145)

## 2024-06-18 LAB — MAGNESIUM: Magnesium: 2 mg/dL (ref 1.7–2.4)

## 2024-06-18 LAB — PHOSPHORUS: Phosphorus: 2.9 mg/dL (ref 2.5–4.6)

## 2024-06-18 MED ORDER — VITAMIN B-12 1000 MCG PO TABS
1000.0000 ug | ORAL_TABLET | Freq: Every day | ORAL | Status: DC
  Administered 2024-06-18 – 2024-06-23 (×6): 1000 ug via ORAL
  Filled 2024-06-18 (×6): qty 1

## 2024-06-18 NOTE — Evaluation (Signed)
 Occupational Therapy Evaluation Patient Details Name: Michele Brewer MRN: [id number] DOB: Jan 12, 1951 Today's Date: 06/18/2024   History of Present Illness   Pt is a 73 year old female presenting to the ED after mechanical fall at home, now s/p Right hip bipolar hemiarthroplasty. 06/17/24  Right wrist sprain with possible distal radio-ulnar joint subluxation.   --Continue Velcro splint as per orthopedics    PMH significant for Parkinson disease, depression     Clinical Impressions Chart reviewed to date, pt greeted semi supine in bed, agreeable to OT evaluation. PTA pt is MOD I-I in ADL/IADL, amb with SPC, fall history. Pt presents with deficits in strength, endurance, activity tolerance, balance, affecting safe and optimal completion. She is performing ADL/functional mobility below PLOF. She will benefit from skilled OT to address functional deficits and to facilitate optimal ADL performance. Pt is left in bedside chair, all needs met. OT will follow.     If plan is discharge home, recommend the following:   A little help with walking and/or transfers;A lot of help with bathing/dressing/bathroom     Functional Status Assessment   Patient has had a recent decline in their functional status and demonstrates the ability to make significant improvements in function in a reasonable and predictable amount of time.     Equipment Recommendations   Other (comment) (defer)     Recommendations for Other Services         Precautions/Restrictions   Precautions Precautions: Posterior Hip;Fall Recall of Precautions/Restrictions: Intact Required Braces or Orthoses: Splint/Cast Splint/Cast: R wrist splint Restrictions Weight Bearing Restrictions Per Provider Order: Yes RUE Weight Bearing Per Provider Order: Weight bearing as tolerated RLE Weight Bearing Per Provider Order: Weight bearing as tolerated     Mobility Bed Mobility Overal bed mobility: Needs Assistance Bed Mobility:  Supine to Sit     Supine to sit: Max assist, HOB elevated          Transfers Overall transfer level: Needs assistance Equipment used: Rolling walker (2 wheels) Transfers: Sit to/from Stand Sit to Stand: Min assist                  Balance Overall balance assessment: Needs assistance, History of Falls Sitting-balance support: Feet supported Sitting balance-Leahy Scale: Fair     Standing balance support: Bilateral upper extremity supported, During functional activity, Reliant on assistive device for balance Standing balance-Leahy Scale: Fair                             ADL either performed or assessed with clinical judgement   ADL Overall ADL's : Needs assistance/impaired Eating/Feeding: Set up;Sitting   Grooming: Wash/dry face;Oral care;Sitting;Set up;Supervision/safety   Upper Body Bathing: Moderate assistance;Sitting   Lower Body Bathing: Minimal assistance;Sitting/lateral leans   Upper Body Dressing : Minimal assistance;Sitting   Lower Body Dressing: Maximal assistance;Sit to/from stand   Toilet Transfer: Minimal assistance;Rolling walker (2 wheels);BSC/3in1 step pivot   Toileting- Clothing Manipulation and Hygiene: Minimal assistance;Sitting/lateral lean               Vision Patient Visual Report: No change from baseline       Perception         Praxis         Pertinent Vitals/Pain Pain Assessment Pain Assessment: 0-10 Pain Score: 3  Pain Location: R hip Pain Descriptors / Indicators: Discomfort, Grimacing Pain Intervention(s): Monitored during session     Extremity/Trunk Assessment Upper Extremity Assessment Upper Extremity  Assessment: Generalized weakness (tremors noted throughout)   Lower Extremity Assessment Lower Extremity Assessment: Defer to PT evaluation   Cervical / Trunk Assessment Cervical / Trunk Assessment: Normal   Communication Communication Communication: No apparent difficulties Factors Affecting  Communication: Reduced clarity of speech (speaks quietly intermittently)   Cognition Arousal: Alert Behavior During Therapy: WFL for tasks assessed/performed                                 Following commands: Impaired Following commands impaired: Follows one step commands with increased time     Cueing  General Comments   Cueing Techniques: Verbal cues;Tactile cues;Visual cues  vss throughout, no complaints of dizziness throughout   Exercises Other Exercises Other Exercises: edu re role of OT, role of rehab, discharge recommendations   Shoulder Instructions      Home Living Family/patient expects to be discharged to:: Private residence Living Arrangements: Other relatives (sister and mom) Available Help at Discharge: Family;Available 24 hours/day Type of Home: House Home Access: Level entry     Home Layout: One level     Bathroom Shower/Tub: Walk-in shower         Home Equipment: Cane - single Librarian, academic (2 wheels)          Prior Functioning/Environment Prior Level of Function : Independent/Modified Independent;Driving             Mobility Comments: amb with SPC ADLs Comments: MOD I-I for ADL/IADL    OT Problem List: Decreased strength;Decreased cognition;Decreased safety awareness;Decreased activity tolerance;Decreased knowledge of use of DME or AE;Impaired balance (sitting and/or standing);Decreased coordination;Impaired sensation   OT Treatment/Interventions: Self-care/ADL training;Therapeutic activities;Therapeutic exercise;Patient/family education;DME and/or AE instruction;Balance training      OT Goals(Current goals can be found in the care plan section)   Acute Rehab OT Goals Patient Stated Goal: rehab OT Goal Formulation: With patient Time For Goal Achievement: 07/02/24 Potential to Achieve Goals: Good ADL Goals Pt Will Perform Grooming: with modified independence;sitting;standing Pt Will Perform Lower Body  Dressing: with modified independence;sitting/lateral leans;sit to/from stand;with adaptive equipment Pt Will Transfer to Toilet: with modified independence;ambulating Pt Will Perform Toileting - Clothing Manipulation and hygiene: with modified independence;sitting/lateral leans;sit to/from stand   OT Frequency:  Min 2X/week    Co-evaluation              AM-PAC OT 6 Clicks Daily Activity     Outcome Measure Help from another person eating meals?: None Help from another person taking care of personal grooming?: None Help from another person toileting, which includes using toliet, bedpan, or urinal?: A Little Help from another person bathing (including washing, rinsing, drying)?: A Lot Help from another person to put on and taking off regular upper body clothing?: A Little Help from another person to put on and taking off regular lower body clothing?: A Lot 6 Click Score: 18   End of Session Equipment Utilized During Treatment: Rolling walker (2 wheels) Nurse Communication: Mobility status  Activity Tolerance: Patient tolerated treatment well Patient left: in chair;with call bell/phone within reach;with chair alarm set  OT Visit Diagnosis: Other abnormalities of gait and mobility (R26.89)                Time: 8592-8568 OT Time Calculation (min): 24 min Charges:  OT General Charges $OT Visit: 1 Visit OT Evaluation $OT Eval Moderate Complexity: 1 Mod  Therisa Sheffield, OTD OTR/L  06/18/24, 4:08 PM

## 2024-06-18 NOTE — Progress Notes (Signed)
 [facility]  - [facility]   PATIENT NAME: Michele Brewer    MR#:  [id number]  DATE OF BIRTH:  1950-10-01  SUBJECTIVE:  no family at bedside. Patient working with PT OT. Overall doing well. Had hip surgery yesterday after mechanical fall at home    VITALS:  Blood pressure 126/64, pulse 60, temperature (!) 97.5 F (36.4 C), resp. rate 16, height 5' 4 (1.626 m), weight 68 kg, SpO2 100%.  PHYSICAL EXAMINATION:   GENERAL:  73 y.o.-year-old patient with no acute distress.  LUNGS: Normal breath sounds bilaterally, no wheezing CARDIOVASCULAR: S1, S2 normal. No murmur   ABDOMEN: Soft, nontender, nondistended. Bowel sounds present.  EXTREMITIES: No  edema b/l.   Surgical dressing present right hip NEUROLOGIC: nonfocal  patient is alert and awake  LABORATORY PANEL:  CBC Recent Labs  Lab 06/18/24 0543  WBC 7.7  HGB 11.3*  HCT 31.6*  PLT 147*    Chemistries  Recent Labs  Lab 06/17/24 0219 06/18/24 0543  NA 138 138  K 4.0 4.5  CL 101 108  CO2 26 25  GLUCOSE 110* 118*  BUN 25* 21  CREATININE 0.78 0.72  CALCIUM 9.4 8.7*  MG  --  2.0  AST 28  --   ALT 18  --   ALKPHOS 98  --   BILITOT 1.1  --    Cardiac Enzymes No results for input(s): TROPONINI in the last 168 hours. RADIOLOGY:  DG HIP UNILAT W OR W/O PELVIS 2-3 VIEWS RIGHT Result Date: 06/17/2024 CLINICAL DATA:  Status post hip hemiarthroplasty. EXAM: DG HIP (WITH OR WITHOUT PELVIS) 2-3V RIGHT COMPARISON:  Preoperative radiograph earlier today. FINDINGS: Right hip hemiarthroplasty in expected alignment. No periprosthetic lucency or fracture. Recent postsurgical change includes air and edema in the soft tissues. Overlying skin staples in place. IMPRESSION: Right hip hemiarthroplasty without immediate postoperative complication. Electronically Signed   By: Andrea Gasman M.D.   On: 06/17/2024 17:17   DG Hip Unilat W or Wo Pelvis 2-3 Views Right Result Date: 06/17/2024 CLINICAL DATA:  Status  post fall. EXAM: DG HIP (WITH OR WITHOUT PELVIS) 2-3V RIGHT COMPARISON:  None Available. FINDINGS: An acute fracture deformity is seen extending through the junction of the head and neck of the proximal right femur. Approximately 1/2 with dorsal displacement of the distal fracture site is noted. There is no evidence of dislocation. Degenerative changes are seen involving both hips, as well as the visualized portion of the lower lumbar spine. IMPRESSION: Acute fracture of the proximal right femur. Electronically Signed   By: Suzen Dials M.D.   On: 06/17/2024 01:41   DG Wrist Complete Right Result Date: 06/17/2024 CLINICAL DATA:  Status post fall. EXAM: RIGHT WRIST - COMPLETE 3+ VIEW COMPARISON:  None Available. FINDINGS: There is no evidence of an acute fracture. The distal right ulna is dorsal in position with respect to the distal right radius on the lateral view. This is of indeterminate age. Chronic and degenerative changes seen along the posterior aspect of the right wrist with additional degenerative changes present at the first right carpometacarpal joint. Soft tissues are unremarkable. IMPRESSION: 1. No acute fracture. 2. Dorsal positioning of the distal right ulna, as described above. Correlation with physical examination is recommended to determine the presence of an acute ligamentous injury. Electronically Signed   By: Suzen Dials M.D.   On: 06/17/2024 01:39    Assessment and Plan Per H and P Brewer Michele is a 73 y.o. female  with PMH of Parkinson disease, depression, as reviewed from EMR, presented to [facility] ED after mechanical fall yesterday at home.  Patient fell on her right side, complaining of severe right hip pain and unable to bear weight.  Patient also complaining of pain in her right wrist.  Denies any head injury.   Patient does not wanted blood transfusion because she is Jehovah's Witness.  Right hip x-ray: Acute fracture of the proximal right femur.    Right wrist  x-ray: 1. No acute fracture. 2. Dorsal positioning of the distal right ulna, Correlation with physical examination is recommended to determine the presence of an acute ligamentous injury.  #Displaced right femoral neck fracture  --S/p mechanical fall at home  --Continue prn meds for pain control --Continue fall precautions --Orthopedic surgery Dr Edie consulted --s/p Right hip bipolar hemiarthroplasty.  --PT/OT/Toc    # Right wrist sprain with possible distal radio-ulnar joint subluxation.  --Continue Velcro splint as per orthopedics --Continue prn pain meds   # Iron deficiency Venofer 3 mg IV x 1 dose ordered by Dr Von --Start oral iron supplement with vitamin C  for discharge --F/u with PCP to repeat iron profile after 3 to 6 months   # Vitamin B12 deficiency: B12 level 211, goal >400.   Started vitamin B12 1000 oral supplement.   Follow-up PCP to repeat vitamin B12 level after 3 to 6 months.   # Folic acid  level 5.4 at lower end, started folic acid  1 mg p.o. daily to prevent deficiency.   # Vitamin D  insufficiency: started vitamin D  50,000 units p.o. weekly, follow with PCP to repeat vitamin D  level after 3 to 6 months.   # Parkinson disease: Continued Sinemet  home dose # Depression: Continued Wellbutrin  and Cymbalta  at home dose   Nutrition: Cardiac diet DVT Prophylaxis: Subcutaneous Lovenox   PT/OT recs Rehab--TOC for d/c planning    Procedures: right hip hemi arthroplasty Family communication : none today Consults : orthopedic CODE STATUS: full DVT Prophylaxis : Lovenox  Level of care: Telemetry Medical Status is: Inpatient Remains inpatient appropriate because: overall doing well. Medically best at baseline for discharge to rehab    TOTAL TIME TAKING CARE OF THIS PATIENT: 40 minutes.  >50% time spent on counselling and coordination of care  Note: This dictation was prepared with Dragon dictation along with smaller phrase technology. Any transcriptional errors  that result from this process are unintentional.  [name] M.D    [facility]   CC: Primary care physician; [name], MD

## 2024-06-18 NOTE — Plan of Care (Signed)

## 2024-06-18 NOTE — Progress Notes (Signed)
  Subjective: 1 Day Post-Op Procedure(s) (LRB): HEMIARTHROPLASTY (BIPOLAR) HIP, POSTERIOR APPROACH FOR FRACTURE (Right) Patient reports pain as 0 on 0-10 scale in the right hip.  Patient is well, and has had no acute complaints or problems PT and care management to assist with d/c planning. Currently lives at home, may need SNF at d/c. Negative for chest pain and shortness of breath Fever: no Gastrointestinal:Negative for nausea and vomiting  Objective: Vital signs in last 24 hours: Temp:  [97.5 F (36.4 C)-99.1 F (37.3 C)] 98.2 F (36.8 C) (10/01 0500) Pulse Rate:  [64-90] 68 (10/01 0500) Resp:  [14-22] 18 (10/01 0500) BP: (91-119)/(49-76) 112/60 (10/01 0500) SpO2:  [94 %-100 %] 96 % (10/01 0500) Weight:  [68 kg] 68 kg (09/30 1240)  Intake/Output from previous day:  Intake/Output Summary (Last 24 hours) at 06/18/2024 0758 Last data filed at 06/18/2024 0026 Gross per 24 hour  Intake 820 ml  Output 900 ml  Net -80 ml    Intake/Output this shift: No intake/output data recorded.  Labs: Recent Labs    06/17/24 0219 06/18/24 0543  HGB 11.9* 11.3*   Recent Labs    06/17/24 0219 06/18/24 0543  WBC 7.2 7.7  RBC 3.59* 3.31*  HCT 33.4* 31.6*  PLT 167 147*   Recent Labs    06/17/24 0219 06/18/24 0543  NA 138 138  K 4.0 4.5  CL 101 108  CO2 26 25  BUN 25* 21  CREATININE 0.78 0.72  GLUCOSE 110* 118*  CALCIUM 9.4 8.7*   No results for input(s): LABPT, INR in the last 72 hours.   EXAM General - Patient is Alert, Appropriate, and Oriented Extremity - ABD soft Neurovascular intact Dorsiflexion/Plantar flexion intact Incision: scant drainage No cellulitis present Dressing/Incision - Mild bloody drainage noted to the right hip dressing. Motor Function - intact, moving foot and toes well on exam.  Abdomen soft with intact bowel sounds.  Past Medical History:  Diagnosis Date   Depression    Elevated CK 06/03/2023   Parkinson disease (HCC)      Assessment/Plan: 1 Day Post-Op Procedure(s) (LRB): HEMIARTHROPLASTY (BIPOLAR) HIP, POSTERIOR APPROACH FOR FRACTURE (Right) Principal Problem:   Closed right hip fracture (HCC)  Estimated body mass index is 25.75 kg/m as calculated from the following:   Height as of this encounter: 5' 4 (1.626 m).   Weight as of this encounter: 68 kg. Advance diet Up with therapy D/C IV fluids when tolerating po intake.  Labs and vitals reviewed, WBC 7.7, Hg 11.3 Continue to work on a BM. Up with therapy today, may need SNF at d/c. Care management to assist with d/c planning.  DVT Prophylaxis - Lovenox  and TED hose Weight-Bearing as tolerated to right leg  J. Gustavo Level, PA-C [facility] 06/18/2024, 7:58 AM

## 2024-06-18 NOTE — Evaluation (Signed)
 Physical Therapy Evaluation Patient Details Name: Michele Brewer MRN: [id number] DOB: 01/01/1951 Today's Date: 06/18/2024  History of Present Illness  Michele Brewer is a 73 y.o. female with PMH of Parkinson disease, depression, as reviewed from EMR, presented to [facility] ED after mechanical fall yesterday at home. Pt is POD 1 after R hemiarthroplasty. MD diagnoses include: Parkinson's Disease, R wrist sprain, and iron deficiency  Clinical Impression  Pt is pleasant 73 y.o. female admitted after mechanical fall. She is POD 1 after R hemiarthroplasty. Prior to hospitalization, pt reports ambulation using SPC and ability to perform ADLs independently. Pt now requires Mod A for supine>sit with use of chuck pad to move pt closed to EOB. Multimodal cuing for sequencing/initiation. Pt requires mod A for step pivot transfer from bed to [facility] for RW management and assistance with weight shift. After transferring, pt endorsed dizziness and nausea requiring min A x 2 for step pivot transfer back to bed. Pt symptoms improved once returned to bed. Pt demonstrates deficits in strength/balance/activity tolerance. Would benefit from skilled PT to address above deficits and promote optimal return to PLOF.         If plan is discharge home, recommend the following: A lot of help with walking and/or transfers;A lot of help with bathing/dressing/bathroom;Assistance with cooking/housework;Assist for transportation   Can travel by private vehicle   No    Equipment Recommendations  (TBD at next venue)  Recommendations for Other Services       Functional Status Assessment Patient has had a recent decline in their functional status and demonstrates the ability to make significant improvements in function in a reasonable and predictable amount of time.     Precautions / Restrictions Precautions Precautions: Posterior Hip;Fall Recall of Precautions/Restrictions: Intact Precaution/Restrictions Comments: Pt able to recall  2/3 posterior hip precautions at end of session Restrictions Weight Bearing Restrictions Per Provider Order: Yes RLE Weight Bearing Per Provider Order: Weight bearing as tolerated      Mobility  Bed Mobility Overal bed mobility: Needs Assistance Bed Mobility: Supine to Sit, Sit to Supine     Supine to sit: Used rails, Mod assist Sit to supine: Max assist, +2 for safety/equipment   General bed mobility comments: Pt requires assistance with supine>sit and is limited by pain. Multimodal cuing for sequencing. Pt endorsing dizziness and nausea requiring MAX x 2 to return to supine position for LE and trunk management.    Transfers Overall transfer level: Needs assistance Equipment used: Rolling walker (2 wheels) Transfers: Sit to/from Stand, Bed to chair/wheelchair/BSC Sit to Stand: Min assist   Step pivot transfers: Min assist, +2 safety/equipment, Mod assist       General transfer comment: Inital transfer from bed to [facility], pt required mod A for RW management and assistance with weight shift. Pt endorsed dizziness while on BSC and required min Ax 2 to return to bed for safety. Multimodal cuing for sequencing and to adhere to posterior hip precautions    Ambulation/Gait               General Gait Details: NT d/t safety.  Stairs            Wheelchair Mobility     Tilt Bed    Modified Rankin (Stroke Patients Only)       Balance Overall balance assessment: Needs assistance, History of Falls Sitting-balance support: Feet supported Sitting balance-Leahy Scale: Fair Sitting balance - Comments: Able to maintain sitting balance while sitting at EOB.   Standing balance support:  Bilateral upper extremity supported, During functional activity, Reliant on assistive device for balance Standing balance-Leahy Scale: Fair Standing balance comment: Reliant on RW for balance during transfer. No LOB noted in standing.                             Pertinent  Vitals/Pain Pain Assessment Pain Assessment: 0-10 Pain Score: 4  Pain Location: R hip Pain Descriptors / Indicators: Discomfort, Grimacing Pain Intervention(s): Monitored during session    Home Living Family/patient expects to be discharged to:: Private residence Living Arrangements: Other relatives Available Help at Discharge: Family;Available 24 hours/day Type of Home: House Home Access: Level entry       Home Layout: One level Home Equipment: Cane - single Librarian, academic (2 wheels)      Prior Function Prior Level of Function : Independent/Modified Independent;Driving             Mobility Comments: Pt reports use of SPC for ambulation. ADLs Comments: Pt states she is the cheuffer of her family. Independent with ADLs.     Extremity/Trunk Assessment   Upper Extremity Assessment Upper Extremity Assessment: Generalized weakness    Lower Extremity Assessment Lower Extremity Assessment: Generalized weakness    Cervical / Trunk Assessment Cervical / Trunk Assessment: Normal  Communication   Communication Communication: Impaired Factors Affecting Communication: Reduced clarity of speech (speaks quietly intermittently)    Cognition Arousal: Alert Behavior During Therapy: WFL for tasks assessed/performed   PT - Cognitive impairments: No apparent impairments                       PT - Cognition Comments: Pt is A&Ox4. Pleasant and agreeable to PT session Following commands: Impaired Following commands impaired: Follows one step commands with increased time     Cueing Cueing Techniques: Verbal cues, Tactile cues, Visual cues     General Comments      Exercises     Assessment/Plan    PT Assessment Patient needs continued PT services  PT Problem List Decreased strength;Decreased range of motion;Decreased activity tolerance;Decreased balance;Decreased mobility;Decreased knowledge of use of DME       PT Treatment Interventions DME  instruction;Gait training;Functional mobility training;Therapeutic activities;Therapeutic exercise;Balance training;Neuromuscular re-education;Patient/family education    PT Goals (Current goals can be found in the Care Plan section)  Acute Rehab PT Goals Patient Stated Goal: to get better and stronger PT Goal Formulation: With patient Time For Goal Achievement: 07/02/24 Potential to Achieve Goals: Fair    Frequency Min 3X/week     Co-evaluation               AM-PAC PT 6 Clicks Mobility  Outcome Measure Help needed turning from your back to your side while in a flat bed without using bedrails?: A Little Help needed moving from lying on your back to sitting on the side of a flat bed without using bedrails?: A Little Help needed moving to and from a bed to a chair (including a wheelchair)?: A Lot Help needed standing up from a chair using your arms (e.g., wheelchair or bedside chair)?: A Lot Help needed to walk in hospital room?: A Lot Help needed climbing 3-5 steps with a railing? : A Lot 6 Click Score: 14    End of Session Equipment Utilized During Treatment: Gait belt Activity Tolerance: Treatment limited secondary to medical complications (Comment) Patient left: in bed;with call bell/phone within reach;with bed alarm set Nurse Communication: Mobility  status PT Visit Diagnosis: Unsteadiness on feet (R26.81);Muscle weakness (generalized) (M62.81);Difficulty in walking, not elsewhere classified (R26.2)    Time: 9041-8969 PT Time Calculation (min) (ACUTE ONLY): 32 min   Charges:                 Jeanenne Licea, SPT   Ramaj Frangos 06/18/2024, 1:05 PM

## 2024-06-18 NOTE — Anesthesia Postprocedure Evaluation (Signed)
 Anesthesia Post Note  Patient: Michele Brewer  Procedure(s) Performed: HEMIARTHROPLASTY (BIPOLAR) HIP, POSTERIOR APPROACH FOR FRACTURE (Right)  Patient location during evaluation: Nursing Unit Anesthesia Type: Spinal Level of consciousness: oriented and awake and alert Pain management: pain level controlled Vital Signs Assessment: post-procedure vital signs reviewed and stable Respiratory status: spontaneous breathing and respiratory function stable Cardiovascular status: blood pressure returned to baseline and stable Postop Assessment: no headache, no backache, no apparent nausea or vomiting and patient able to bend at knees Anesthetic complications: no   No notable events documented.   Last Vitals:  Vitals:   06/18/24 0500 06/18/24 0841  BP: 112/60 126/64  Pulse: 68 60  Resp: 18 16  Temp: 36.8 C (!) 36.4 C  SpO2: 96% 100%    Last Pain:  Vitals:   06/18/24 0800  TempSrc:   PainSc: 0-No pain                 Ronalee, Scheunemann

## 2024-06-19 DIAGNOSIS — S63301A Traumatic rupture of unspecified ligament of right wrist, initial encounter: Secondary | ICD-10-CM | POA: Diagnosis not present

## 2024-06-19 DIAGNOSIS — G20B1 Parkinson's disease with dyskinesia, without mention of fluctuations: Secondary | ICD-10-CM | POA: Diagnosis not present

## 2024-06-19 DIAGNOSIS — S72001A Fracture of unspecified part of neck of right femur, initial encounter for closed fracture: Secondary | ICD-10-CM | POA: Diagnosis not present

## 2024-06-19 LAB — CBC
HCT: 29.2 % — ABNORMAL LOW (ref 36.0–46.0)
Hemoglobin: 10.1 g/dL — ABNORMAL LOW (ref 12.0–15.0)
MCH: 31.7 pg (ref 26.0–34.0)
MCHC: 34.6 g/dL (ref 30.0–36.0)
MCV: 91.5 fL (ref 80.0–100.0)
Platelets: 141 K/uL — ABNORMAL LOW (ref 150–400)
RBC: 3.19 MIL/uL — ABNORMAL LOW (ref 3.87–5.11)
RDW: 13.2 % (ref 11.5–15.5)
WBC: 8.8 K/uL (ref 4.0–10.5)
nRBC: 0 % (ref 0.0–0.2)

## 2024-06-19 LAB — BASIC METABOLIC PANEL WITH GFR
Anion gap: 5 (ref 5–15)
BUN: 26 mg/dL — ABNORMAL HIGH (ref 8–23)
CO2: 24 mmol/L (ref 22–32)
Calcium: 8.8 mg/dL — ABNORMAL LOW (ref 8.9–10.3)
Chloride: 106 mmol/L (ref 98–111)
Creatinine, Ser: 0.7 mg/dL (ref 0.44–1.00)
GFR, Estimated: >60 mL/min (ref >60)
Glucose, Bld: 107 mg/dL — ABNORMAL HIGH (ref 70–99)
Potassium: 4.3 mmol/L (ref 3.5–5.1)
Sodium: 135 mmol/L (ref 135–145)

## 2024-06-19 LAB — MAGNESIUM: Magnesium: 2.1 mg/dL (ref 1.7–2.4)

## 2024-06-19 LAB — PHOSPHORUS: Phosphorus: 2.6 mg/dL (ref 2.5–4.6)

## 2024-06-19 MED ORDER — DOCUSATE SODIUM 100 MG PO CAPS: 100.0000 mg | ORAL_CAPSULE | Freq: Two times a day (BID) | ORAL | 0 refills | Status: AC

## 2024-06-19 MED ORDER — ASPIRIN 325 MG PO TBEC: 325.0000 mg | DELAYED_RELEASE_TABLET | Freq: Every day | ORAL | 0 refills | Status: AC

## 2024-06-19 MED ORDER — HYDROCODONE-ACETAMINOPHEN 5-325 MG PO TABS: 1.0000 | ORAL_TABLET | Freq: Four times a day (QID) | ORAL | 0 refills | Status: DC | PRN

## 2024-06-19 MED ORDER — BISACODYL 10 MG RE SUPP
10.0000 mg | Freq: Every day | RECTAL | Status: DC
Start: 2024-06-19 — End: 2024-06-23
  Administered 2024-06-20: 10 mg via RECTAL
  Filled 2024-06-19 (×3): qty 1

## 2024-06-19 MED ORDER — ENOXAPARIN SODIUM 40 MG/0.4ML IJ SOSY: 40.0000 mg | PREFILLED_SYRINGE | INTRAMUSCULAR | 0 refills | Status: DC

## 2024-06-19 NOTE — Plan of Care (Signed)

## 2024-06-19 NOTE — Progress Notes (Signed)
 Physical Therapy Treatment Patient Details Name: Michele Brewer MRN: [id number] DOB: 02-19-1951 Today's Date: 06/19/2024   History of Present Illness Pt is a 73 year old female presenting to the ED after mechanical fall at home, now s/p Right hip bipolar hemiarthroplasty. 06/17/24  Right wrist sprain with possible distal radio-ulnar joint subluxation.   --Continue Velcro splint as per orthopedics    PMH significant for Parkinson disease, depression    PT Comments  Pt demonstrates improvement with amb using RW; pt able to amb 94ft with min A for weight shift and chair follow for safety. Visual and verbal cues provided by SPT to assist with initiation, as pt demonstrated difficulty with advancing LLE. Pt amb with slowed velocity and using step-to pattern. Verbal cuing for sequencing.    If plan is discharge home, recommend the following: A lot of help with walking and/or transfers;A lot of help with bathing/dressing/bathroom;Assistance with cooking/housework;Assist for transportation   Can travel by private vehicle     Yes  Equipment Recommendations  Other (comment) (TBD at next venue)    Recommendations for Other Services       Precautions / Restrictions Precautions Precautions: Posterior Hip;Fall Recall of Precautions/Restrictions: Impaired Precaution/Restrictions Comments: Pt able to recall 2/3 posterior hip precautions. Can recall the last posterior hip precaution with cuing Required Braces or Orthoses: Splint/Cast Splint/Cast: R wrist splint Restrictions Weight Bearing Restrictions Per Provider Order: Yes RUE Weight Bearing Per Provider Order: Weight bearing as tolerated RLE Weight Bearing Per Provider Order: Weight bearing as tolerated     Mobility  Bed Mobility Overal bed mobility: Needs Assistance             General bed mobility comments: NT. Pt received in recliner pre/post session    Transfers Overall transfer level: Needs assistance Equipment used: Rolling  walker (2 wheels) Transfers: Sit to/from Stand Sit to Stand: Min assist (follow with recliner)           General transfer comment: Min A for STS from recliner for lift. Able to perform STS while adhering to hip precautions    Ambulation/Gait Ambulation/Gait assistance: Mod assist, Min assist, +2 safety/equipment (+2 for recliner follow) Gait Distance (Feet): 15 Feet Assistive device: Rolling walker (2 wheels) Gait Pattern/deviations: Step-to pattern, Decreased stance time - right Gait velocity: dec     General Gait Details: Mod A down to min A while ambulating to assist with weight shift. Verbal cuing for sequencing and visual cue to assist with initiation   Stairs             Wheelchair Mobility     Tilt Bed    Modified Rankin (Stroke Patients Only)       Balance Overall balance assessment: Needs assistance, History of Falls Sitting-balance support: Feet supported Sitting balance-Leahy Scale: Fair Sitting balance - Comments: able to main sitting balance in recliner. good trunk conntrol to scoot forward to prep for stand   Standing balance support: Bilateral upper extremity supported, During functional activity, Reliant on assistive device for balance Standing balance-Leahy Scale: Fair Standing balance comment: Reliant on RW for balance during transfer. No LOB noted in standing.                            Communication Communication Communication: No apparent difficulties  Cognition Arousal: Alert Behavior During Therapy: WFL for tasks assessed/performed   PT - Cognitive impairments: No apparent impairments  PT - Cognition Comments: Pleasant and agreeable to PT session Following commands: Impaired Following commands impaired: Follows one step commands with increased time    Cueing Cueing Techniques: Verbal cues, Tactile cues, Visual cues  Exercises      General Comments        Pertinent Vitals/Pain Pain  Assessment Pain Assessment: 0-10 Pain Score: 7  Pain Location: R knee Pain Descriptors / Indicators: Discomfort Pain Intervention(s): Monitored during session    Home Living                          Prior Function            PT Goals (current goals can now be found in the care plan section) Acute Rehab PT Goals Patient Stated Goal: to get better and stronger PT Goal Formulation: With patient Time For Goal Achievement: 07/02/24 Potential to Achieve Goals: Fair Progress towards PT goals: Progressing toward goals    Frequency    Min 3X/week      PT Plan      Co-evaluation              AM-PAC PT 6 Clicks Mobility   Outcome Measure  Help needed turning from your back to your side while in a flat bed without using bedrails?: A Little Help needed moving from lying on your back to sitting on the side of a flat bed without using bedrails?: A Little Help needed moving to and from a bed to a chair (including a wheelchair)?: A Little Help needed standing up from a chair using your arms (e.g., wheelchair or bedside chair)?: A Little Help needed to walk in hospital room?: A Little Help needed climbing 3-5 steps with a railing? : A Lot 6 Click Score: 17    End of Session Equipment Utilized During Treatment: Gait belt Activity Tolerance: Patient tolerated treatment well Patient left: in chair;with call bell/phone within reach;with chair alarm set Nurse Communication: Mobility status PT Visit Diagnosis: Unsteadiness on feet (R26.81);Muscle weakness (generalized) (M62.81);Difficulty in walking, not elsewhere classified (R26.2)     Time: 8897-8879 PT Time Calculation (min) (ACUTE ONLY): 18 min  Charges:                            [name], SPT    Jhana Giarratano 06/19/2024, 1:02 PM

## 2024-06-19 NOTE — Progress Notes (Signed)
   Subjective: 2 Days Post-Op Procedure(s) (LRB): HEMIARTHROPLASTY (BIPOLAR) HIP, POSTERIOR APPROACH FOR FRACTURE (Right) Patient reports pain as mild.   Patient is well, and has had no acute complaints or problems Denies any CP, SOB, ABD pain. We will continue therapy today.   Objective: Vital signs in last 24 hours: Temp:  [98 F (36.7 C)-98.8 F (37.1 C)] 98.4 F (36.9 C) (10/02 0822) Pulse Rate:  [70-79] 70 (10/02 0822) Resp:  [16-20] 16 (10/02 0822) BP: (103-132)/(54-73) 132/59 (10/02 0822) SpO2:  [97 %-100 %] 100 % (10/02 0822)  Intake/Output from previous day: 10/01 0701 - 10/02 0700 In: 760 [P.O.:360; I.V.:400] Out: -  Intake/Output this shift: No intake/output data recorded.  Recent Labs    06/17/24 0219 06/18/24 0543 06/19/24 0233  HGB 11.9* 11.3* 10.1*   Recent Labs    06/18/24 0543 06/19/24 0233  WBC 7.7 8.8  RBC 3.31* 3.19*  HCT 31.6* 29.2*  PLT 147* 141*   Recent Labs    06/18/24 0543 06/19/24 0233  NA 138 135  K 4.5 4.3  CL 108 106  CO2 25 24  BUN 21 26*  CREATININE 0.72 0.70  GLUCOSE 118* 107*  CALCIUM 8.7* 8.8*   No results for input(s): LABPT, INR in the last 72 hours.  EXAM General - Patient is Alert, Appropriate, and Oriented Extremity - Neurovascular intact Sensation intact distally Intact pulses distally Dorsiflexion/Plantar flexion intact No cellulitis present Compartment soft Dressing - dressing C/D/I and scant drainage Motor Function - intact, moving foot and toes well on exam.   Past Medical History:  Diagnosis Date   Depression    Elevated CK 06/03/2023   Parkinson disease (HCC)     Assessment/Plan:   2 Days Post-Op Procedure(s) (LRB): HEMIARTHROPLASTY (BIPOLAR) HIP, POSTERIOR APPROACH FOR FRACTURE (Right) Principal Problem:   Closed right hip fracture (HCC) Active Problems:   Rupture of ligament of right wrist  Estimated body mass index is 25.75 kg/m as calculated from the following:   Height as of  this encounter: 5' 4 (1.626 m).   Weight as of this encounter: 68 kg. Advance diet Up with therapy Pain well controlled VSS Labs stable, Hgb 10.1 CM to assist with discharge to SNF   DVT Prophylaxis - Lovenox , TED hose, and SCDs Weight-Bearing as tolerated to right leg   T. Medford Amber, PA-C [facility] 06/19/2024, 10:21 AM

## 2024-06-19 NOTE — Plan of Care (Signed)
  Problem: Activity: Goal: Risk for activity intolerance will decrease Outcome: Progressing   Problem: Education: Goal: Knowledge of General Education information will improve Description: Including pain rating scale, medication(s)/side effects and non-pharmacologic comfort measures Outcome: Adequate for Discharge   Problem: Health Behavior/Discharge Planning: Goal: Ability to manage health-related needs will improve Outcome: Adequate for Discharge   Problem: Clinical Measurements: Goal: Ability to maintain clinical measurements within normal limits will improve Outcome: Adequate for Discharge Goal: Will remain free from infection Outcome: Adequate for Discharge Goal: Diagnostic test results will improve Outcome: Adequate for Discharge Goal: Respiratory complications will improve Outcome: Adequate for Discharge Goal: Cardiovascular complication will be avoided Outcome: Adequate for Discharge   Problem: Nutrition: Goal: Adequate nutrition will be maintained Outcome: Adequate for Discharge   Problem: Coping: Goal: Level of anxiety will decrease Outcome: Adequate for Discharge   Problem: Elimination: Goal: Will not experience complications related to bowel motility Outcome: Adequate for Discharge Goal: Will not experience complications related to urinary retention Outcome: Adequate for Discharge   Problem: Pain Managment: Goal: General experience of comfort will improve and/or be controlled Outcome: Adequate for Discharge   Problem: Safety: Goal: Ability to remain free from injury will improve Outcome: Adequate for Discharge   Problem: Skin Integrity: Goal: Risk for impaired skin integrity will decrease Outcome: Adequate for Discharge

## 2024-06-19 NOTE — Discharge Summary (Addendum)
 Physician Discharge Summary   Patient: Michele Brewer MRN: [id number] DOB: 1951-08-01  Admit date:     06/17/2024  Discharge date: 06/19/24  Discharge Physician: [name]   PCP: [name], MD   Recommendations at discharge:    F/u PCP in 1-2 weeks F/u  Dr edie in 2 weeks post op check up  Discharge Diagnoses: Principal Problem:   Closed right hip fracture [facility]) Active Problems:   Rupture of ligament of right wrist  Michele Brewer is a 73 y.o. female with PMH of Parkinson disease, depression, as reviewed from EMR, presented to [facility] ED after mechanical fall yesterday at home.  Patient fell on her right side, complaining of severe right hip pain and unable to bear weight.  Patient also complaining of pain in her right wrist.  Denies any head injury.    Patient does not wanted blood transfusion because she is Jehovah's Witness.   Right hip x-ray: Acute fracture of the proximal right femur.    Right wrist x-ray: 1. No acute fracture. 2. Dorsal positioning of the distal right ulna, Correlation with physical examination is recommended to determine the presence of an acute ligamentous injury.   #Displaced right femoral neck fracture  --S/p mechanical fall at home  --Continue prn meds for pain control --Continue fall precautions --Orthopedic surgery Dr edie consulted --s/p Right hip bipolar hemiarthroplasty.  --PT/OT/Toc--pt medically improving and waiting for rehab bed    # Right wrist sprain with possible distal radio-ulnar joint subluxation.  --Continue Velcro splint as per orthopedics --Continue prn pain meds   # Iron deficiency Venofer 3 mg IV x 1 dose ordered by Dr Von --Start oral iron supplement with vitamin C  for discharge --F/u with PCP to repeat iron profile after 3 to 6 months   # Vitamin B12 deficiency: B12 level 211, goal >400.   Started vitamin B12 1000 oral supplement.   Follow-up PCP to repeat vitamin B12 level after 3 to 6 months.   # Folic acid   level 5.4 at lower end, started folic acid  1 mg p.o. daily to prevent deficiency.   # Vitamin D  insufficiency: started vitamin D  50,000 units p.o. weekly, follow with PCP to repeat vitamin D  level after 3 to 6 months.   # Parkinson disease: Continued Sinemet   and gabapentin    # Depression: Continued Wellbutrin  and Cymbalta  at home dose     Nutrition: Cardiac diet DVT Prophylaxis: Subcutaneous Lovenox  for 14 days and asa 325 mg every day for 30 start after 14 days of lovenox    PT/OT recs Rehab--TOC for d/c planning once rehab bed found     Procedures: right hip hemi arthroplasty Family communication : none today--pt tells me family/friend aware Consults : orthopedic CODE STATUS: full DVT Prophylaxis : Lovenox      Pain control - [place]  Controlled Substance Reporting System database was reviewed. and patient was instructed, not to drive, operate heavy machinery, perform activities at heights, swimming or participation in water activities or provide baby-sitting services while on Pain, Sleep and Anxiety Medications; until their outpatient Physician has advised to do so again. Also recommended to not to take more than prescribed Pain, Sleep and Anxiety Medications.  DISCHARGE MEDICATION: Allergies as of 06/19/2024       Reactions   Citrus    rash   Peanut-containing Drug Products    Rash and itch        Medication List     TAKE these medications    acetaminophen  325 MG tablet Commonly  known as: TYLENOL  Take 2 tablets (650 mg total) by mouth every 6 (six) hours as needed for mild pain, moderate pain, fever or headache (or Fever >/= 101).   aspirin EC 325 MG tablet Take 1 tablet (325 mg total) by mouth daily. Start taking on: July 05, 2024   buPROPion  150 MG 24 hr tablet Commonly known as: WELLBUTRIN  XL Take 1 tablet (150 mg total) by mouth daily.   carbidopa -levodopa  25-100 MG tablet Commonly known as: SINEMET  IR Take 1.5 tablets by mouth 2 (two) times  daily.   carbidopa -levodopa  25-100 MG tablet Commonly known as: SINEMET  IR Take 1.5 tablets by mouth at bedtime.   cyanocobalamin  1000 MCG tablet Take 1 tablet (1,000 mcg total) by mouth daily.   docusate sodium 100 MG capsule Commonly known as: COLACE Take 1 capsule (100 mg total) by mouth 2 (two) times daily.   DULoxetine  30 MG capsule Commonly known as: CYMBALTA  Take 1 capsule (30 mg total) by mouth 2 (two) times daily.   enoxaparin  40 MG/0.4ML injection Commonly known as: LOVENOX  Inject 0.4 mLs (40 mg total) into the skin daily for 14 days. Start taking on: June 20, 2024   feeding supplement (NEPRO CARB STEADY) Liqd Take 237 mLs by mouth 3 (three) times daily between meals.   fluticasone  50 MCG/ACT nasal spray Commonly known as: FLONASE  Place 2 sprays into both nostrils daily.   folic acid  1 MG tablet Commonly known as: FOLVITE  Take 1 tablet (1 mg total) by mouth daily.   gabapentin  100 MG capsule Commonly known as: NEURONTIN  Take 1 capsule (100 mg total) by mouth 3 (three) times daily. What changed: Another medication with the same name was removed. Continue taking this medication, and follow the directions you see here.   HYDROcodone-acetaminophen  5-325 MG tablet Commonly known as: NORCO/VICODIN Take 1-2 tablets by mouth every 6 (six) hours as needed for moderate pain (pain score 4-6) or severe pain (pain score 7-10).   meclizine  25 MG tablet Commonly known as: ANTIVERT  Take 1 tablet (25 mg total) by mouth 3 (three) times daily as needed for dizziness.   multivitamin with minerals Tabs tablet Take 1 tablet by mouth daily.   polyethylene glycol 17 g packet Commonly known as: MIRALAX  / GLYCOLAX  Take 17 g by mouth daily as needed for moderate constipation.   thiamine  100 MG tablet Commonly known as: Vitamin B-1 Take 1 tablet (100 mg total) by mouth daily.   Vitamin D  (Ergocalciferol ) 1.25 MG (50000 UNIT) Caps capsule Commonly known as: DRISDOL  Take 1  capsule (50,000 Units total) by mouth once a week. What changed: Another medication with the same name was removed. Continue taking this medication, and follow the directions you see here.        Follow-up Information     [name], MD. Schedule an appointment as soon as possible for a visit in 1 week(s).   Specialty: Internal Medicine Contact information: [address] [place] [address] [phone]         Edie Norleen PARAS, MD. Schedule an appointment as soon as possible for a visit in 2 week(s).   Specialty: Orthopedic Surgery Contact information: [address] [facility] [place] [address] [phone]                Discharge Exam: Michele Brewer   06/17/24 0039 06/17/24 1240  Weight: 68 kg 68 kg   GENERAL:  73 y.o.-year-old patient with no acute distress.  LUNGS: Normal  breath sounds bilaterally, no wheezing CARDIOVASCULAR: S1, S2 normal. No murmur   ABDOMEN: Soft, nontender, nondistended. Bowel sounds present.  EXTREMITIES: No  edema b/l.   Surgical dressing present right hip NEUROLOGIC: nonfocal  patient is alert and awake    Condition at discharge: fair  The results of significant diagnostics from this hospitalization (including imaging, microbiology, ancillary and laboratory) are listed below for reference.   Imaging Studies: DG HIP UNILAT W OR W/O PELVIS 2-3 VIEWS RIGHT Result Date: 06/17/2024 CLINICAL DATA:  Status post hip hemiarthroplasty. EXAM: DG HIP (WITH OR WITHOUT PELVIS) 2-3V RIGHT COMPARISON:  Preoperative radiograph earlier today. FINDINGS: Right hip hemiarthroplasty in expected alignment. No periprosthetic lucency or fracture. Recent postsurgical change includes air and edema in the soft tissues. Overlying skin staples in place. IMPRESSION: Right hip hemiarthroplasty without immediate postoperative complication. Electronically Signed   By: Andrea Gasman M.D.   On: 06/17/2024 17:17    DG Hip Unilat W or Wo Pelvis 2-3 Views Right Result Date: 06/17/2024 CLINICAL DATA:  Status post fall. EXAM: DG HIP (WITH OR WITHOUT PELVIS) 2-3V RIGHT COMPARISON:  None Available. FINDINGS: An acute fracture deformity is seen extending through the junction of the head and neck of the proximal right femur. Approximately 1/2 with dorsal displacement of the distal fracture site is noted. There is no evidence of dislocation. Degenerative changes are seen involving both hips, as well as the visualized portion of the lower lumbar spine. IMPRESSION: Acute fracture of the proximal right femur. Electronically Signed   By: Suzen Dials M.D.   On: 06/17/2024 01:41   DG Wrist Complete Right Result Date: 06/17/2024 CLINICAL DATA:  Status post fall. EXAM: RIGHT WRIST - COMPLETE 3+ VIEW COMPARISON:  None Available. FINDINGS: There is no evidence of an acute fracture. The distal right ulna is dorsal in position with respect to the distal right radius on the lateral view. This is of indeterminate age. Chronic and degenerative changes seen along the posterior aspect of the right wrist with additional degenerative changes present at the first right carpometacarpal joint. Soft tissues are unremarkable. IMPRESSION: 1. No acute fracture. 2. Dorsal positioning of the distal right ulna, as described above. Correlation with physical examination is recommended to determine the presence of an acute ligamentous injury. Electronically Signed   By: Suzen Dials M.D.   On: 06/17/2024 01:39    Microbiology: Results for orders placed or performed during the hospital encounter of 06/02/23  Resp Panel by RT-PCR (Flu A&B, Covid) Anterior Nasal Swab     Status: Abnormal   Collection Time: 06/02/23 10:40 AM   Specimen: Anterior Nasal Swab  Result Value Ref Range Status   SARS Coronavirus 2 by RT PCR POSITIVE (A) NEGATIVE Final    Comment: (NOTE) SARS-CoV-2 target nucleic acids are DETECTED.  The SARS-CoV-2 RNA is  generally detectable in upper respiratory specimens during the acute phase of infection. Positive results are indicative of the presence of the identified virus, but do not rule out bacterial infection or co-infection with other pathogens not detected by the test. Clinical correlation with patient history and other diagnostic information is necessary to determine patient infection status. The expected result is Negative.  Fact Sheet for Patients: BloggerCourse.com  Fact Sheet for Healthcare Providers: SeriousBroker.it  This test is not yet approved or cleared by the [place]  FDA and  has been authorized for detection and/or diagnosis of SARS-CoV-2 by FDA under an Emergency Use Authorization (EUA).  This EUA will remain in effect (meaning this  test can be used) for the duration of  the COVID-19 declaration under Section 564(b)(1) of the A ct, 21 U.S.C. section 360bbb-3(b)(1), unless the authorization is terminated or revoked sooner.     Influenza A by PCR NEGATIVE NEGATIVE Final   Influenza B by PCR NEGATIVE NEGATIVE Final    Comment: (NOTE) The Xpert Xpress SARS-CoV-2/FLU/RSV plus assay is intended as an aid in the diagnosis of influenza from Nasopharyngeal swab specimens and should not be used as a sole basis for treatment. Nasal washings and aspirates are unacceptable for Xpert Xpress SARS-CoV-2/FLU/RSV testing.  Fact Sheet for Patients: BloggerCourse.com  Fact Sheet for Healthcare Providers: SeriousBroker.it  This test is not yet approved or cleared by the [place]  FDA and has been authorized for detection and/or diagnosis of SARS-CoV-2 by FDA under an Emergency Use Authorization (EUA). This EUA will remain in effect (meaning this test can be used) for the duration of the COVID-19 declaration under Section 564(b)(1) of the Act, 21 U.S.C. section 360bbb-3(b)(1),  unless the authorization is terminated or revoked.  Performed at [facility], [address] [place], [place] [address]   Urine Culture     Status: Abnormal   Collection Time: 06/06/23  3:00 PM   Specimen: Urine, Random  Result Value Ref Range Status   Specimen Description   Final    URINE, RANDOM Performed at [facility], [address], [place], [place] [address]    Special Requests   Final    NONE Performed at [facility], [address] [place], [place] [address]    Culture (A)  Final    <10,000 COLONIES/mL INSIGNIFICANT GROWTH Performed at [facility], [address] [place], [place] [address]    Report Status 06/07/2023 FINAL  Final    Labs: CBC: Recent Labs  Lab 06/17/24 0219 06/18/24 0543 06/19/24 0233  WBC 7.2 7.7 8.8  NEUTROABS 6.4  --   --   HGB 11.9* 11.3* 10.1*  HCT 33.4* 31.6* 29.2*  MCV 93.0 95.5 91.5  PLT 167 147* 141*   Basic Metabolic Panel: Recent Labs  Lab 06/17/24 0218 06/17/24 0219 06/18/24 0543 06/19/24 0233  NA  --  138 138 135  K  --  4.0 4.5 4.3  CL  --  101 108 106  CO2  --  26 25 24   GLUCOSE  --  110* 118* 107*  BUN  --  25* 21 26*  CREATININE  --  0.78 0.72 0.70  CALCIUM  --  9.4 8.7* 8.8*  MG 2.1  --  2.0 2.1  PHOS 2.6  --  2.9 2.6   Liver Function Tests: Recent Labs  Lab 06/17/24 0219  AST 28  ALT 18  ALKPHOS 98  BILITOT 1.1  PROT 7.5  ALBUMIN 3.7   CBG: No results for input(s): GLUCAP in the last 168 hours.  Discharge time spent: greater than 30 minutes.  Signed: [name], MD [facility] 06/19/2024

## 2024-06-19 NOTE — Progress Notes (Signed)
 [facility]  - [place] at [facility]   PATIENT NAME: Michele Brewer    MR#:  [id number]  DATE OF BIRTH:  13-Oct-1950  SUBJECTIVE:  no family at bedside. Tells me her friend has been informed and updated. Denies any complaints. Discussed about discharge planning to rehab once med available. Encourage patient to takes bowel prep for constipation  VITALS:  Blood pressure (!) 132/59, pulse 70, temperature 98.4 F (36.9 C), temperature source Oral, resp. rate 16, height 5' 4 (1.626 m), weight 68 kg, SpO2 100%.  PHYSICAL EXAMINATION:   GENERAL:  73 y.o.-year-old patient with no acute distress.  LUNGS: Normal breath sounds bilaterally, no wheezing CARDIOVASCULAR: S1, S2 normal. No murmur   ABDOMEN: Soft, nontender, nondistended. Bowel sounds present.  EXTREMITIES: No  edema b/l.   Surgical dressing present right hip NEUROLOGIC: nonfocal  patient is alert and awake  LABORATORY PANEL:  CBC Recent Labs  Lab 06/19/24 0233  WBC 8.8  HGB 10.1*  HCT 29.2*  PLT 141*    Chemistries  Recent Labs  Lab 06/17/24 0219 06/18/24 0543 06/19/24 0233  NA 138   < > 135  K 4.0   < > 4.3  CL 101   < > 106  CO2 26   < > 24  GLUCOSE 110*   < > 107*  BUN 25*   < > 26*  CREATININE 0.78   < > 0.70  CALCIUM 9.4   < > 8.8*  MG  --    < > 2.1  AST 28  --   --   ALT 18  --   --   ALKPHOS 98  --   --   BILITOT 1.1  --   --    < > = values in this interval not displayed.   Cardiac Enzymes No results for input(s): TROPONINI in the last 168 hours. RADIOLOGY:  DG HIP UNILAT W OR W/O PELVIS 2-3 VIEWS RIGHT Result Date: 06/17/2024 CLINICAL DATA:  Status post hip hemiarthroplasty. EXAM: DG HIP (WITH OR WITHOUT PELVIS) 2-3V RIGHT COMPARISON:  Preoperative radiograph earlier today. FINDINGS: Right hip hemiarthroplasty in expected alignment. No periprosthetic lucency or fracture. Recent postsurgical change includes air and edema in the soft tissues. Overlying skin staples in place.  IMPRESSION: Right hip hemiarthroplasty without immediate postoperative complication. Electronically Signed   By: Andrea Gasman M.D.   On: 06/17/2024 17:17    Assessment and Plan Per H and P Michele Brewer is a 73 y.o. female with PMH of Parkinson disease, depression, as reviewed from EMR, presented to [facility] ED after mechanical fall yesterday at home.  Patient fell on her right side, complaining of severe right hip pain and unable to bear weight.  Patient also complaining of pain in her right wrist.  Denies any head injury.   Patient does not wanted blood transfusion because she is Jehovah's Witness.  Right hip x-ray: Acute fracture of the proximal right femur.    Right wrist x-ray: 1. No acute fracture. 2. Dorsal positioning of the distal right ulna, Correlation with physical examination is recommended to determine the presence of an acute ligamentous injury.  #Displaced right femoral neck fracture  --S/p mechanical fall at home  --Continue prn meds for pain control --Continue fall precautions --Orthopedic surgery Dr Edie consulted --s/p Right hip bipolar hemiarthroplasty.  --PT/OT/Toc--pt medically improving and waiting for rehab bed    # Right wrist sprain with possible distal radio-ulnar joint subluxation.  --Continue Velcro splint as per orthopedics --  Continue prn pain meds   # Iron deficiency Venofer 3 mg IV x 1 dose ordered by Dr Von --Start oral iron supplement with vitamin C  for discharge --F/u with PCP to repeat iron profile after 3 to 6 months   # Vitamin B12 deficiency: B12 level 211, goal >400.   Started vitamin B12 1000 oral supplement.   Follow-up PCP to repeat vitamin B12 level after 3 to 6 months.   # Folic acid  level 5.4 at lower end, started folic acid  1 mg p.o. daily to prevent deficiency.   # Vitamin D  insufficiency: started vitamin D  50,000 units p.o. weekly, follow with PCP to repeat vitamin D  level after 3 to 6 months.   # Parkinson disease: Continued  Sinemet  home dose  # Depression: Continued Wellbutrin  and Cymbalta  at home dose   Nutrition: Cardiac diet DVT Prophylaxis: Subcutaneous Lovenox   PT/OT recs Rehab--TOC for d/c planning    Procedures: right hip hemi arthroplasty Family communication : none today Consults : orthopedic CODE STATUS: full DVT Prophylaxis : Lovenox  Level of care: Telemetry Medical Status is: Inpatient Remains inpatient appropriate because: overall doing well. Medically best at baseline for discharge to rehab    TOTAL TIME TAKING CARE OF THIS PATIENT: 40 minutes.  >50% time spent on counselling and coordination of care  Note: This dictation was prepared with Dragon dictation along with smaller phrase technology. Any transcriptional errors that result from this process are unintentional.  [name] M.D    [facility]   CC: Primary care physician; [name], MD

## 2024-06-20 DIAGNOSIS — S63301A Traumatic rupture of unspecified ligament of right wrist, initial encounter: Secondary | ICD-10-CM | POA: Diagnosis not present

## 2024-06-20 DIAGNOSIS — S72001A Fracture of unspecified part of neck of right femur, initial encounter for closed fracture: Secondary | ICD-10-CM | POA: Diagnosis not present

## 2024-06-20 DIAGNOSIS — G20B1 Parkinson's disease with dyskinesia, without mention of fluctuations: Secondary | ICD-10-CM | POA: Diagnosis not present

## 2024-06-20 LAB — MAGNESIUM: Magnesium: 2.1 mg/dL (ref 1.7–2.4)

## 2024-06-20 LAB — BASIC METABOLIC PANEL WITH GFR
Anion gap: 7 (ref 5–15)
BUN: 31 mg/dL — ABNORMAL HIGH (ref 8–23)
CO2: 26 mmol/L (ref 22–32)
Calcium: 8.9 mg/dL (ref 8.9–10.3)
Chloride: 104 mmol/L (ref 98–111)
Creatinine, Ser: 0.7 mg/dL (ref 0.44–1.00)
GFR, Estimated: >60 mL/min (ref >60)
Glucose, Bld: 111 mg/dL — ABNORMAL HIGH (ref 70–99)
Potassium: 4.4 mmol/L (ref 3.5–5.1)
Sodium: 137 mmol/L (ref 135–145)

## 2024-06-20 LAB — CBC
HCT: 28.4 % — ABNORMAL LOW (ref 36.0–46.0)
Hemoglobin: 10 g/dL — ABNORMAL LOW (ref 12.0–15.0)
MCH: 31.9 pg (ref 26.0–34.0)
MCHC: 35.2 g/dL (ref 30.0–36.0)
MCV: 90.7 fL (ref 80.0–100.0)
Platelets: 145 K/uL — ABNORMAL LOW (ref 150–400)
RBC: 3.13 MIL/uL — ABNORMAL LOW (ref 3.87–5.11)
RDW: 13.5 % (ref 11.5–15.5)
WBC: 7.1 K/uL (ref 4.0–10.5)
nRBC: 0 % (ref 0.0–0.2)

## 2024-06-20 LAB — PHOSPHORUS: Phosphorus: 2.8 mg/dL (ref 2.5–4.6)

## 2024-06-20 MED ORDER — LACTULOSE 10 GM/15ML PO SOLN
10.0000 g | Freq: Every day | ORAL | Status: DC
  Administered 2024-06-20 – 2024-06-21 (×2): 10 g via ORAL
  Filled 2024-06-20 (×3): qty 30

## 2024-06-20 NOTE — Progress Notes (Signed)
 Occupational Therapy Treatment Patient Details Name: Michele Brewer MRN: [id number] DOB: Jul 26, 1951 Today's Date: 06/20/2024   History of present illness Pt is a 73 year old female presenting to the ED after mechanical fall at home, now s/p Right hip bipolar hemiarthroplasty. 06/17/24  Right wrist sprain with possible distal radio-ulnar joint subluxation.   --Continue Velcro splint as per orthopedics    PMH significant for Parkinson disease, depression   OT comments  Michele Brewer was seen for OT treatment on this date. Upon arrival to room pt seated in recliner, agreeable to OT Tx session, eager to perform oral care at sink. OT facilitated ADL management with education and assistance as described below. See ADL section for additional details regarding occupational performance. Pt continues to be functionally limited by increased pain with mobility, decreased LB access in setting of posterior THP's, and decreased balance. Pt return verbalizes understanding of education provided t/o session. Abel to return verbalize 3/3 posterior hip precautions at end of session. Pt is progressing toward OT goals and continues to benefit from skilled OT services to maximize return to PLOF and minimize risk of future falls, injury, and readmission. Will continue to follow POC as written. Discharge recommendation remains appropriate.        If plan is discharge home, recommend the following:  A little help with walking and/or transfers;A lot of help with bathing/dressing/bathroom   Equipment Recommendations  Other (comment) (defer)    Recommendations for Other Services      Precautions / Restrictions Precautions Precautions: Posterior Hip;Fall Precaution Booklet Issued: No Recall of Precautions/Restrictions: Impaired Required Braces or Orthoses: Splint/Cast Splint/Cast: R wrist splint Restrictions Weight Bearing Restrictions Per Provider Order: Yes RUE Weight Bearing Per Provider Order: Weight bearing as  tolerated RLE Weight Bearing Per Provider Order: Weight bearing as tolerated       Mobility Bed Mobility               General bed mobility comments: NT. Pt received in recliner pre/post session    Transfers Overall transfer level: Needs assistance Equipment used: Rolling walker (2 wheels) Transfers: Sit to/from Stand Sit to Stand: Contact guard assist, Supervision           General transfer comment: CGA to perform STS from recliner. SUPERVISION for functional mobility in room.     Balance Overall balance assessment: Needs assistance, History of Falls Sitting-balance support: Feet supported Sitting balance-Leahy Scale: Fair     Standing balance support: During functional activity, Reliant on assistive device for balance, Single extremity supported, Bilateral upper extremity supported Standing balance-Leahy Scale: Fair Standing balance comment: Reliant on RW for balance during transfer. No LOB noted in standing.                           ADL either performed or assessed with clinical judgement   ADL Overall ADL's : Needs assistance/impaired     Grooming: Wash/dry face;Oral care;Standing;Cueing for safety Grooming Details (indicate cue type and reason): [place] while standing at sink. Min cueing for safety and use of RW t/o functional activity. Pt with good recall of posterior THP's during functional activity.             Lower Body Dressing: Maximal assistance;Sit to/from stand Lower Body Dressing Details (indicate cue type and reason): Anticipate based on function. Pt educated on safe use of AE/DME for LB ADL management in setting of posterior THP's. Would benefit from reinforcement. Toilet Transfer: Supervision/safety;Set up;Rolling walker (2  wheels) Toilet Transfer Details (indicate cue type and reason): Simulated to/from recliner.         Functional mobility during ADLs: Supervision/safety;Set up;Cueing for safety;Rolling walker (2 wheels)       Extremity/Trunk Assessment              Vision Patient Visual Report: No change from baseline     Perception     Praxis     Communication Communication Communication: No apparent difficulties Factors Affecting Communication: Reduced clarity of speech   Cognition Arousal: Alert Behavior During Therapy: WFL for tasks assessed/performed                                 Following commands: Impaired Following commands impaired: Follows one step commands with increased time      Cueing   Cueing Techniques: Verbal cues, Tactile cues, Visual cues  Exercises Other Exercises Other Exercises: OT facilitated ADL management with EDU and assist as described above. See ADL section for detail.    Shoulder Instructions       General Comments  (R hip honey comb dressing intact with old dried blood)    Pertinent Vitals/ Pain       Pain Assessment Pain Assessment: 0-10 Pain Score: 2  Pain Location: R Hip Pain Descriptors / Indicators: Discomfort, Guarding Pain Intervention(s): Limited activity within patient's tolerance, Monitored during session, Repositioned  Home Living                                          Prior Functioning/Environment              Frequency  Min 2X/week        Progress Toward Goals  OT Goals(current goals can now be found in the care plan section)  Progress towards OT goals: Progressing toward goals  Acute Rehab OT Goals Patient Stated Goal: to feel better OT Goal Formulation: With patient Time For Goal Achievement: 07/02/24 Potential to Achieve Goals: Good  Plan      Co-evaluation                 AM-PAC OT 6 Clicks Daily Activity     Outcome Measure   Help from another person eating meals?: A Little Help from another person taking care of personal grooming?: A Little Help from another person toileting, which includes using toliet, bedpan, or urinal?: A Little Help from another person  bathing (including washing, rinsing, drying)?: A Lot Help from another person to put on and taking off regular upper body clothing?: A Little Help from another person to put on and taking off regular lower body clothing?: A Lot 6 Click Score: 16    End of Session Equipment Utilized During Treatment: Rolling walker (2 wheels)  OT Visit Diagnosis: Other abnormalities of gait and mobility (R26.89)   Activity Tolerance Patient tolerated treatment well   Patient Left in chair;with call bell/phone within reach;with chair alarm set   Nurse Communication Mobility status        Time: 8894-8868 OT Time Calculation (min): 26 min  Charges: OT General Charges $OT Visit: 1 Visit OT Treatments $Self Care/Home Management : 23-37 mins  Jhonny Pelton, M.S., OTR/L 06/20/24, 12:57 PM

## 2024-06-20 NOTE — TOC Progression Note (Addendum)
 Transition of Care [facility]) - Progression Note    Patient Details  Name: Michele Brewer MRN: [id number] Date of Birth: 10/22/1950  [facility] of [facility]) CM/SW Contact  Marinda Cooks, RN Phone Number: 06/20/2024, 3:49 PM  Clinical Narrative:    This CM spoke with pt introduced role and discussed dc plan and completed Initial assessment . Pt A&Ox4 Pt reports living in a mobile home with ramp  entry in the front and rear. Pt shared she lives with her 52 yr old mother and sister Barnie .  Pt uses Psychologist, forensic in [place] & mail  order pharm with [employer] .   PCP is listed as Dr. Vishwanath Hande at the [place] . Pt reports she used a straight cane DME prior to this admission & was independent with all her ADL'S .  Pt reports not  being in SNF prior to  this admission or having HH services . Pt denies/confirms community coordinated .  This CM spoke with pt about DC recommendations for SNF/Rehab pt agreed with plan and informed she was at a rehab facility and could not remember the name.Pt agreed to have bed referrals sent out to local facilities . This CM completed FL2 and informed Dr. [name] of her required co - sign ,  bed referrals  sent  awaiting responses to provide pt for choice .   TOC will cont to follow pt's dc planning / care coordination  during hospital stay and update as applicable.     Expected Discharge Plan and Services         Expected Discharge Date: 06/19/24                    Social Drivers of Health (SDOH) Interventions SDOH Screenings   Food Insecurity: No Food Insecurity (06/17/2024)  Housing: Low Risk  (06/17/2024)  Transportation Needs: No Transportation Needs (06/17/2024)  Utilities: Not At Risk (06/17/2024)  Financial Resource Strain: Low Risk  (06/12/2024)   Received from [facility]  Social Connections: Moderately Isolated (06/17/2024)  Tobacco Use: Low Risk  (06/17/2024)    Readmission Risk Interventions     No data to  display

## 2024-06-20 NOTE — Progress Notes (Signed)
 [facility]  - [place] [facility]   PATIENT NAME: Michele Brewer    MR#:  [id number]  DATE OF BIRTH:  23-May-1951  SUBJECTIVE:  no family at bedside.  Discussed about discharge planning to rehab once med available. Pt et to have BM. Took meds per RN Sitting up in chair. Working with PT  VITALS:  Blood pressure 133/61, pulse 66, temperature 98.7 F (37.1 C), resp. rate 16, height 5' 4 (1.626 m), weight 68 kg, SpO2 100%.  PHYSICAL EXAMINATION:   GENERAL:  73 y.o.-year-old patient with no acute distress.  LUNGS: Normal breath sounds bilaterally, no wheezing CARDIOVASCULAR: S1, S2 normal. No murmur   ABDOMEN: Soft, nontender, nondistended. Bowel sounds present.  EXTREMITIES: No  edema b/l.   Surgical dressing present right hip NEUROLOGIC: nonfocal  patient is alert and awake  LABORATORY PANEL:  CBC Recent Labs  Lab 06/20/24 0433  WBC 7.1  HGB 10.0*  HCT 28.4*  PLT 145*    Chemistries  Recent Labs  Lab 06/17/24 0219 06/18/24 0543 06/20/24 0433  NA 138   < > 137  K 4.0   < > 4.4  CL 101   < > 104  CO2 26   < > 26  GLUCOSE 110*   < > 111*  BUN 25*   < > 31*  CREATININE 0.78   < > 0.70  CALCIUM 9.4   < > 8.9  MG  --    < > 2.1  AST 28  --   --   ALT 18  --   --   ALKPHOS 98  --   --   BILITOT 1.1  --   --    < > = values in this interval not displayed.   Assessment and Plan Per H and P Michele Brewer is a 73 y.o. female with PMH of Parkinson disease, depression, as reviewed from EMR, presented to [facility] ED after mechanical fall yesterday at home.  Patient fell on her right side, complaining of severe right hip pain and unable to bear weight.  Patient also complaining of pain in her right wrist.  Denies any head injury.   Patient does not wanted blood transfusion because she is Jehovah's Witness.  Right hip x-ray: Acute fracture of the proximal right femur.    Right wrist x-ray: 1. No acute fracture. 2. Dorsal positioning of the distal right  ulna, Correlation with physical examination is recommended to determine the presence of an acute ligamentous injury.  #Displaced right femoral neck fracture  --S/p mechanical fall at home  --Continue prn meds for pain control --Continue fall precautions --Orthopedic surgery Dr Edie consulted --s/p Right hip bipolar hemiarthroplasty.  --PT/OT/Toc--pt medically improving and waiting for rehab bed    # Right wrist sprain with possible distal radio-ulnar joint subluxation.  --Continue Velcro splint as per orthopedics --Continue prn pain meds   # Iron deficiency Venofer 3 mg IV x 1 dose ordered by Dr Von --Start oral iron supplement with vitamin C  for discharge --F/u with PCP to repeat iron profile after 3 to 6 months   # Vitamin B12 deficiency: B12 level 211, goal >400.   Started vitamin B12 1000 oral supplement.   Follow-up PCP to repeat vitamin B12 level after 3 to 6 months.   # Folic acid  level 5.4 at lower end, started folic acid  1 mg p.o. daily to prevent deficiency.   # Vitamin D  insufficiency: started vitamin D  50,000 units p.o. weekly, follow with  PCP to repeat vitamin D  level after 3 to 6 months.   # Parkinson disease: Continued Sinemet  home dose  # Depression: Continued Wellbutrin  and Cymbalta  at home dose   Nutrition: Cardiac diet DVT Prophylaxis: Subcutaneous Lovenox   PT/OT recs Rehab--TOC for d/c planning.     Procedures: right hip hemi arthroplasty Family communication : none today Consults : orthopedic CODE STATUS: full DVT Prophylaxis : Lovenox  for 14 days and then ASA for 1 month--per Dr Edie Level of care: Telemetry Medical Status is: Inpatient Remains inpatient appropriate because: overall doing well. Medically best at baseline for discharge to rehab    TOTAL TIME TAKING CARE OF THIS PATIENT: 35 minutes.  >50% time spent on counselling and coordination of care  Note: This dictation was prepared with Dragon dictation along with smaller phrase  technology. Any transcriptional errors that result from this process are unintentional.  [name] M.D    [facility]   CC: Primary care physician; [name], MD

## 2024-06-20 NOTE — Progress Notes (Signed)
 Physical Therapy Treatment Patient Details Name: Michele Brewer MRN: [id number] DOB: March 21, 1951 Today's Date: 06/20/2024   History of Present Illness Pt is a 74 year old female presenting to the ED after mechanical fall at home, now s/p Right hip bipolar hemiarthroplasty. 06/17/24  Right wrist sprain with possible distal radio-ulnar joint subluxation.   --Continue Velcro splint as per orthopedics    PMH significant for Parkinson disease, depression    PT Comments  Great functional progress this am. Pt able to rise from recliner with MinA to RW. Gait training distance increased to 44ft with RW and CGA. Increased time to complete step to gait with fair wt shift to R and minimal c/o increased discomfort. Reviewed Post Hip Prec and seated exercises with good teach back. Initial recs for STR once medically cleared remain appropriate.    If plan is discharge home, recommend the following: A lot of help with walking and/or transfers;A lot of help with bathing/dressing/bathroom;Assistance with cooking/housework;Assist for transportation   Can travel by private vehicle     Yes  Equipment Recommendations  Other (comment) (TBD)    Recommendations for Other Services       Precautions / Restrictions Precautions Precautions: Posterior Hip;Fall Precaution Booklet Issued: Yes (comment) (Pt given Post Hip HEP and precaution packet with good understanding) Recall of Precautions/Restrictions: Impaired Precaution/Restrictions Comments: Pt able to recall 2/3 posterior hip precautions. Can recall the last posterior hip precaution with cuing Required Braces or Orthoses: Splint/Cast Splint/Cast: R wrist splint Splint/Cast - Date Prophylactic Dressing Applied (if applicable):  (Ruptured R wrist ligament) Restrictions Weight Bearing Restrictions Per Provider Order: Yes RUE Weight Bearing Per Provider Order: Weight bearing as tolerated RLE Weight Bearing Per Provider Order: Weight bearing as tolerated      Mobility  Bed Mobility               General bed mobility comments: NT. Pt received in recliner pre/post session    Transfers Overall transfer level: Needs assistance Equipment used: Rolling walker (2 wheels) Transfers: Sit to/from Stand Sit to Stand: Min assist           General transfer comment:  (MinA to power up to standing and gain balance to prevent posterior LOB)    Ambulation/Gait Ambulation/Gait assistance: Contact guard assist Gait Distance (Feet): 85 Feet Assistive device: Rolling walker (2 wheels) Gait Pattern/deviations: Step-to pattern, Decreased stance time - right, Antalgic Gait velocity: dec     General Gait Details:  (Significant improvement with gait distance and tolerance)   Stairs             Wheelchair Mobility     Tilt Bed    Modified Rankin (Stroke Patients Only)       Balance Overall balance assessment: Needs assistance, History of Falls Sitting-balance support: Feet supported Sitting balance-Leahy Scale: Fair     Standing balance support: Bilateral upper extremity supported, During functional activity, Reliant on assistive device for balance Standing balance-Leahy Scale: Fair Standing balance comment: Reliant on RW for balance during transfer. No LOB noted in standing.                            Communication Communication Communication: No apparent difficulties  Cognition Arousal: Alert Behavior During Therapy: WFL for tasks assessed/performed   PT - Cognitive impairments: No apparent impairments                       PT - Cognition  Comments: Pleasant and agreeable to PT session Following commands: Impaired Following commands impaired: Follows one step commands with increased time    Cueing Cueing Techniques: Verbal cues, Tactile cues, Visual cues  Exercises General Exercises - Lower Extremity Ankle Circles/Pumps: AROM, Both, 10 reps Long Arc Quad: AROM, Both, 10 reps Other  Exercises Other Exercises:  (Pt educated on role of PT, benefits, hip precautions, benefits of OOB.)    General Comments General comments (skin integrity, edema, etc.):  (R hip honey comb dressing intact with old dried blood)      Pertinent Vitals/Pain Pain Assessment Pain Assessment: 0-10 Pain Score: 3  Pain Location: R Hip Pain Descriptors / Indicators: Discomfort Pain Intervention(s): Monitored during session    Home Living                          Prior Function            PT Goals (current goals can now be found in the care plan section) Acute Rehab PT Goals Patient Stated Goal: to get better and stronger Progress towards PT goals: Progressing toward goals    Frequency    Min 3X/week      PT Plan      Co-evaluation              AM-PAC PT 6 Clicks Mobility   Outcome Measure  Help needed turning from your back to your side while in a flat bed without using bedrails?: A Lot Help needed moving from lying on your back to sitting on the side of a flat bed without using bedrails?: A Little Help needed moving to and from a bed to a chair (including a wheelchair)?: A Little Help needed standing up from a chair using your arms (e.g., wheelchair or bedside chair)?: A Little Help needed to walk in hospital room?: A Little Help needed climbing 3-5 steps with a railing? : A Lot 6 Click Score: 16    End of Session Equipment Utilized During Treatment: Gait belt Activity Tolerance: Patient tolerated treatment well Patient left: in chair;with call bell/phone within reach;with chair alarm set Nurse Communication: Mobility status PT Visit Diagnosis: Unsteadiness on feet (R26.81);Muscle weakness (generalized) (M62.81);Difficulty in walking, not elsewhere classified (R26.2)     Time: 9055-8976 PT Time Calculation (min) (ACUTE ONLY): 39 min  Charges:    $Gait Training: 8-22 mins $Therapeutic Exercise: 8-22 mins $Therapeutic Activity: 8-22 mins PT  General Charges $$ ACUTE PT VISIT: 1 Visit                    Darice Bohr, PTA  Darice JAYSON Bohr 06/20/2024, 12:06 PM

## 2024-06-20 NOTE — [place] FL2 (Signed)
 [place]  MEDICAID [id number] LEVEL OF CARE FORM     IDENTIFICATION  Patient Name: Michele Brewer Birthdate: 25-Nov-1950 Sex: female Admission Date (Current Location): 06/17/2024  [facility] and [name] Number:  Chiropodist and Address:  [facility], [address] [place], [place] [address]      Provider Number: [id number]  Attending Physician Name and Address:  Tobie Calix, MD  Relative Name and Phone Number:  Pt makes her own decisions has Curtistine Molt  Friend as  Emergency Contact  [phone]  Therese Kiang    Current Level of Care: SNF Recommended Level of Care: Skilled Nursing Facility Prior Approval Number:    Date Approved/Denied:   PASRR Number: [id number]  Discharge Plan: SNF    Current Diagnoses: Patient Active Problem List   Diagnosis Date Noted   Rupture of ligament of right wrist 06/18/2024   Closed right hip fracture (HCC) 06/17/2024   COVID-19 virus infection 06/02/2023   Constipation 07/18/2022   Vitamin B12 deficiency 07/16/2022   Vitamin D  deficiency 07/16/2022   Folate deficiency 07/16/2022   Right leg weakness 07/15/2022   Neuropathy 07/15/2022   Ambulatory dysfunction 07/14/2022   Hypomagnesemia 05/28/2022   Parkinson disease (HCC) 05/25/2022   Depression 05/25/2022   Frequent falls 02/09/2021   Multiple fractures of ribs, left side, subsequent encounter for fracture with routine healing 02/08/2021   Polyneuropathy, unspecified 02/08/2021   Traumatic rhabdomyolysis 02/04/2021   Fall 02/03/2021   Osteopenia 04/07/2020   Major depressive disorder, recurrent, 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    Orientation RESPIRATION BLADDER Height & Weight     Self, Time, Situation, Place  Normal Continent Weight: 68 kg Height:  5' 4 (162.6 cm)  BEHAVIORAL SYMPTOMS/MOOD NEUROLOGICAL BOWEL NUTRITION STATUS     (N/A) Continent Diet (Diet Heart with thin liquids)  AMBULATORY STATUS COMMUNICATION OF NEEDS Skin   Extensive Assist Verbally  Surgical wounds                       Personal Care Assistance Level of Assistance  Bathing, Feeding, Dressing, Total care Bathing Assistance: Limited assistance Feeding assistance: Independent Dressing Assistance: Limited assistance Total Care Assistance: Independent   Functional Limitations Info  Hearing (N/A)   Hearing Info: Impaired (some impairment per bedside RN , pt does not wear hearing aids)      SPECIAL CARE FACTORS FREQUENCY  PT (By licensed PT), OT (By licensed OT)     PT Frequency: 5 x a wk OT Frequency: 5 x a wk            Contractures Contractures Info: Not present    Additional Factors Info  Code Status Code Status Info: Full             Current Medications (06/20/2024):  This is the current hospital active medication list Current Facility-Administered Medications  Medication Dose Route Frequency Provider Last Rate Last Admin   acetaminophen  (TYLENOL ) tablet 325-650 mg  325-650 mg Oral Q6H PRN Poggi, John J, MD       bisacodyl (DULCOLAX) suppository 10 mg  10 mg Rectal Daily PRN Poggi, John J, MD       bisacodyl (DULCOLAX) suppository 10 mg  10 mg Rectal Daily Patel, Sona, MD   10 mg at 06/20/24 9167   buPROPion  (WELLBUTRIN  XL) 24 hr tablet 150 mg  150 mg Oral Daily Poggi, John J, MD   150 mg at 06/20/24 9166   carbidopa -levodopa  (SINEMET  IR) 25-100 MG per tablet immediate release  1.5 tablet  1.5 tablet Oral TID Poggi, John J, MD   1.5 tablet at 06/20/24 9166   cyanocobalamin  (VITAMIN B12) tablet 1,000 mcg  1,000 mcg Oral Daily Patel, Sona, MD   1,000 mcg at 06/20/24 9166   diphenhydrAMINE (BENADRYL) 12.5 MG/5ML elixir 12.5-25 mg  12.5-25 mg Oral Q4H PRN Poggi, John J, MD       docusate sodium (COLACE) capsule 100 mg  100 mg Oral BID Poggi, John J, MD   100 mg at 06/20/24 9166   DULoxetine  (CYMBALTA ) DR capsule 30 mg  30 mg Oral BID Poggi, John J, MD   30 mg at 06/20/24 9166   enoxaparin  (LOVENOX ) injection 40 mg  40 mg Subcutaneous Q24H Poggi,  John J, MD   40 mg at 06/20/24 9167   folic acid  (FOLVITE ) tablet 1 mg  1 mg Oral Daily Poggi, John J, MD   1 mg at 06/20/24 9166   gabapentin  (NEURONTIN ) capsule 100 mg  100 mg Oral TID Poggi, John J, MD   100 mg at 06/20/24 9166   HYDROcodone-acetaminophen  (NORCO/VICODIN) 5-325 MG per tablet 1-2 tablet  1-2 tablet Oral Q6H PRN Poggi, John J, MD   1 tablet at 06/19/24 1032   lactulose  (CHRONULAC ) 10 GM/15ML solution 10 g  10 g Oral Daily Kip Lynwood Double, PA-C   10 g at 06/20/24 9166   magnesium  hydroxide (MILK OF MAGNESIA) suspension 30 mL  30 mL Oral Daily PRN Poggi, Norleen PARAS, MD       meclizine  (ANTIVERT ) tablet 25 mg  25 mg Oral TID PRN Poggi, John J, MD       melatonin tablet 5 mg  5 mg Oral QHS PRN Poggi, John J, MD       methocarbamol  (ROBAXIN ) tablet 500 mg  500 mg Oral Q6H PRN Poggi, John J, MD       Or   methocarbamol  (ROBAXIN ) injection 500 mg  500 mg Intravenous Q6H PRN Poggi, John J, MD       metoCLOPramide (REGLAN) tablet 5-10 mg  5-10 mg Oral Q8H PRN Poggi, John J, MD       Or   metoCLOPramide (REGLAN) injection 5-10 mg  5-10 mg Intravenous Q8H PRN Poggi, John J, MD       ondansetron  (ZOFRAN ) tablet 4 mg  4 mg Oral Q6H PRN Poggi, John J, MD       Or   ondansetron  (ZOFRAN ) injection 4 mg  4 mg Intravenous Q6H PRN Poggi, John J, MD       sodium chloride  flush (NS) 0.9 % injection 3 mL  3 mL Intravenous Q12H Poggi, Norleen PARAS, MD   3 mL at 06/20/24 0836   sodium chloride  flush (NS) 0.9 % injection 3 mL  3 mL Intravenous PRN Poggi, Norleen PARAS, MD       sodium phosphate (FLEET) enema 1 enema  1 enema Rectal Once PRN Poggi, John J, MD       Vitamin D  (Ergocalciferol ) (DRISDOL ) 1.25 MG (50000 UNIT) capsule 50,000 Units  50,000 Units Oral Q7 days Von Bellis, MD   50,000 Units at 06/18/24 0026     Discharge Medications: Please see discharge summary for a list of discharge medications.  Relevant Imaging Results:  Relevant Lab Results:   Additional Information SSN:   [id number]  Marinda Cooks, RN

## 2024-06-20 NOTE — Care Management Important Message (Signed)
 Important Message  Patient Details  Name: Michele Brewer MRN: [id number] Date of Birth: Dec 18, 1950   Important Message Given:  Yes - Medicare IM     BRANDY CHRISTIANE ORN, CMA 06/20/2024, 3:22 PM

## 2024-06-20 NOTE — Discharge Instructions (Signed)

## 2024-06-20 NOTE — Progress Notes (Signed)
 Subjective: 3 Days Post-Op Procedure(s) (LRB): HEMIARTHROPLASTY (BIPOLAR) HIP, POSTERIOR APPROACH FOR FRACTURE (Right) Patient reports pain as mild in the right hip. Patient is well, and has had no acute complaints or problems Denies any CP, SOB, ABD pain. We will continue therapy today.  Passing gas but has not had a BM.  Objective: Vital signs in last 24 hours: Temp:  [97.6 F (36.4 C)-98.8 F (37.1 C)] 98.8 F (37.1 C) (10/03 0518) Pulse Rate:  [70-84] 72 (10/03 0518) Resp:  [16] 16 (10/03 0518) BP: (113-132)/(52-64) 128/64 (10/03 0518) SpO2:  [98 %-100 %] 98 % (10/03 0518)  Intake/Output from previous day: 10/02 0701 - 10/03 0700 In: 363 [P.O.:360; I.V.:3] Out: -  Intake/Output this shift: No intake/output data recorded.  Recent Labs    06/18/24 0543 06/19/24 0233 06/20/24 0433  HGB 11.3* 10.1* 10.0*   Recent Labs    06/19/24 0233 06/20/24 0433  WBC 8.8 7.1  RBC 3.19* 3.13*  HCT 29.2* 28.4*  PLT 141* 145*   Recent Labs    06/19/24 0233 06/20/24 0433  NA 135 137  K 4.3 4.4  CL 106 104  CO2 24 26  BUN 26* 31*  CREATININE 0.70 0.70  GLUCOSE 107* 111*  CALCIUM 8.8* 8.9   No results for input(s): LABPT, INR in the last 72 hours.  EXAM General - Patient is Alert, Appropriate, and Oriented Extremity - Neurovascular intact Sensation intact distally Intact pulses distally Dorsiflexion/Plantar flexion intact No cellulitis present Compartment soft Dressing - dressing C/D/I and scant drainage Motor Function - intact, moving foot and toes well on exam.   Past Medical History:  Diagnosis Date   Depression    Elevated CK 06/03/2023   Parkinson disease (HCC)     Assessment/Plan:   3 Days Post-Op Procedure(s) (LRB): HEMIARTHROPLASTY (BIPOLAR) HIP, POSTERIOR APPROACH FOR FRACTURE (Right) Principal Problem:   Closed right hip fracture (HCC) Active Problems:   Rupture of ligament of right wrist  Estimated body mass index is 25.75 kg/m as  calculated from the following:   Height as of this encounter: 5' 4 (1.626 m).   Weight as of this encounter: 68 kg. Advance diet Up with therapy Pain well controlled VSS Labs stable, Hgb 10.0 CM to assist with discharge to SNF Continue to work on a BM.  Add lactulose  to bowel regimen today.  Following discharge, Continue Lovenox  40 daily for 14 days. Follow-up with [facility] in 10-14 days for staple removal and x-rays.  DVT Prophylaxis - Lovenox , TED hose, and SCDs Weight-Bearing as tolerated to right leg  J. Gustavo Level, PA-C [facility] 06/20/2024, 7:14 AM

## 2024-06-20 NOTE — Plan of Care (Signed)

## 2024-06-21 DIAGNOSIS — S72001A Fracture of unspecified part of neck of right femur, initial encounter for closed fracture: Secondary | ICD-10-CM | POA: Diagnosis not present

## 2024-06-21 DIAGNOSIS — G20B1 Parkinson's disease with dyskinesia, without mention of fluctuations: Secondary | ICD-10-CM | POA: Diagnosis not present

## 2024-06-21 DIAGNOSIS — S63301A Traumatic rupture of unspecified ligament of right wrist, initial encounter: Secondary | ICD-10-CM | POA: Diagnosis not present

## 2024-06-21 NOTE — Plan of Care (Signed)

## 2024-06-21 NOTE — Progress Notes (Signed)
 [facility]  - [place] [facility]   PATIENT NAME: Michele Brewer    MR#:  [id number]  DATE OF BIRTH:  11/17/1950  SUBJECTIVE:  no family at bedside.  Sitting up in bed.  VITALS:  Blood pressure 127/67, pulse 60, temperature 98.1 F (36.7 C), resp. rate 16, height 5' 4 (1.626 m), weight 68 kg, SpO2 100%.  PHYSICAL EXAMINATION:   GENERAL:  73 y.o.-year-old patient with no acute distress.  LUNGS: Normal breath sounds bilaterally CARDIOVASCULAR: S1, S2 normal. No murmur   ABDOMEN: Soft, nontender, nondistended. Bowel sounds present.  EXTREMITIES: No  edema b/l.   Surgical dressing present right hip NEUROLOGIC: nonfocal  patient is alert and awake  LABORATORY PANEL:  CBC Recent Labs  Lab 06/20/24 0433  WBC 7.1  HGB 10.0*  HCT 28.4*  PLT 145*    Chemistries  Recent Labs  Lab 06/17/24 0219 06/18/24 0543 06/20/24 0433  NA 138   < > 137  K 4.0   < > 4.4  CL 101   < > 104  CO2 26   < > 26  GLUCOSE 110*   < > 111*  BUN 25*   < > 31*  CREATININE 0.78   < > 0.70  CALCIUM 9.4   < > 8.9  MG  --    < > 2.1  AST 28  --   --   ALT 18  --   --   ALKPHOS 98  --   --   BILITOT 1.1  --   --    < > = values in this interval not displayed.   Assessment and Plan Per H and P Michele Brewer is a 73 y.o. female with PMH of Parkinson disease, depression, as reviewed from EMR, presented to [facility] ED after mechanical fall yesterday at home.  Patient fell on her right side, complaining of severe right hip pain and unable to bear weight.  Patient also complaining of pain in her right wrist.  Denies any head injury.   Patient does not wanted blood transfusion because she is Jehovah's Witness.  Right hip x-ray: Acute fracture of the proximal right femur.    Right wrist x-ray: 1. No acute fracture. 2. Dorsal positioning of the distal right ulna, Correlation with physical examination is recommended to determine the presence of an acute ligamentous injury.  #Displaced  right femoral neck fracture  --S/p mechanical fall at home  --Continue prn meds for pain control --Continue fall precautions --Orthopedic surgery Dr Edie consulted --s/p Right hip bipolar hemiarthroplasty.  --PT/OT/Toc--pt medically improving and waiting for rehab bed    # Right wrist sprain with possible distal radio-ulnar joint subluxation.  --Continue Velcro splint as per orthopedics --Continue prn pain meds   # Iron deficiency Venofer 3 mg IV x 1 dose ordered by Dr Von --Start oral iron supplement with vitamin C  for discharge --F/u with PCP to repeat iron profile after 3 to 6 months   # Vitamin B12 deficiency: B12 level 211, goal >400.   Started vitamin B12 1000 oral supplement.   Follow-up PCP to repeat vitamin B12 level after 3 to 6 months.   # Folic acid  level 5.4 at lower end, started folic acid  1 mg p.o. daily to prevent deficiency.   # Vitamin D  insufficiency: started vitamin D  50,000 units p.o. weekly, follow with PCP to repeat vitamin D  level after 3 to 6 months.   # Parkinson disease: Continued Sinemet  home dose  # Depression:  Continued Wellbutrin  and Cymbalta  at home dose   Nutrition: Cardiac diet DVT Prophylaxis: Subcutaneous Lovenox   PT/OT recs Rehab--TOC for d/c planning.     Procedures: right hip hemi arthroplasty Family communication : none today Consults : orthopedic CODE STATUS: full DVT Prophylaxis : Lovenox  for 14 days and then ASA for 1 month--per Dr Edie Level of care: Telemetry Medical Status is: Inpatient Remains inpatient appropriate because: overall doing well. Medically best at baseline for discharge to rehab    TOTAL TIME TAKING CARE OF THIS PATIENT: 35 minutes.  >50% time spent on counselling and coordination of care  Note: This dictation was prepared with Dragon dictation along with smaller phrase technology. Any transcriptional errors that result from this process are unintentional.  Luciano Cinquemani M.D    [facility]    CC: Primary care physician; [name], MD

## 2024-06-21 NOTE — Progress Notes (Signed)
 Physical Therapy Treatment Patient Details Name: Michele Brewer MRN: [id number] DOB: November 30, 1950 Today's Date: 06/21/2024   History of Present Illness Pt is a 73 year old female presenting to the ED after mechanical fall at home, now s/p Right hip bipolar hemiarthroplasty. 06/17/24  Right wrist sprain with possible distal radio-ulnar joint subluxation.   --Continue Velcro splint as per orthopedics    PMH significant for Parkinson disease, depression    PT Comments  Pt received in bed, agreeable to PT session. ModA to transfer to EOB and stand at RW to access [facility]. Prolonged static standing at RW for hygiene, followed by standing at sink for dynamic reaching and balance challenge with CGA. Pt c/o dizziness and nausea requiring short distance gait to bedside recliner, stating I feel like I'm going to pass out. Symptoms slowly resolved after several minutes in sitting and positional BP assessed for accurate reference, Sitting 115/65, HR 78  Standing 2-3 minutes 84/50, HR 96. MD and nursing notified.    If plan is discharge home, recommend the following: A lot of help with walking and/or transfers;A lot of help with bathing/dressing/bathroom;Assistance with cooking/housework;Assist for transportation   Can travel by private vehicle     Yes  Equipment Recommendations  Other (comment) (TBD)    Recommendations for Other Services       Precautions / Restrictions Precautions Precautions: Posterior Hip;Fall Precaution Booklet Issued: Yes (comment) Recall of Precautions/Restrictions: Impaired Precaution/Restrictions Comments: Pt able to recall 2/3 posterior hip precautions. Can recall the last posterior hip precaution with cuing Required Braces or Orthoses: Splint/Cast Splint/Cast: R wrist splint Restrictions Weight Bearing Restrictions Per Provider Order: Yes RUE Weight Bearing Per Provider Order: Weight bearing as tolerated RLE Weight Bearing Per Provider Order: Weight bearing as tolerated      Mobility  Bed Mobility Overal bed mobility: Needs Assistance Bed Mobility: Supine to Sit     Supine to sit: Mod assist, HOB elevated, Used rails          Transfers Overall transfer level: Needs assistance Equipment used: Rolling walker (2 wheels) Transfers: Sit to/from Stand, Bed to chair/wheelchair/BSC Sit to Stand: Contact guard assist, Supervision   Step pivot transfers: Contact guard assist       General transfer comment:  (Prolonged standing at [facility] for Independent hygiene management with single UE support)    Ambulation/Gait Ambulation/Gait assistance: Contact guard assist Gait Distance (Feet): 25 Feet Assistive device: Rolling walker (2 wheels) Gait Pattern/deviations: Step-to pattern, Decreased stance time - right, Antalgic Gait velocity: dec     General Gait Details:  (Pt with c/o nausea and dizziness after prolonged period of standing, gait limited)   Stairs             Wheelchair Mobility     Tilt Bed    Modified Rankin (Stroke Patients Only)       Balance Overall balance assessment: Needs assistance, History of Falls Sitting-balance support: Feet supported Sitting balance-Leahy Scale: Fair     Standing balance support: During functional activity, Reliant on assistive device for balance, Single extremity supported, Bilateral upper extremity supported Standing balance-Leahy Scale: Fair Standing balance comment:  (Static single UE assist during independent hygiene)                            Communication Communication Communication: No apparent difficulties Factors Affecting Communication: Reduced clarity of speech  Cognition Arousal: Alert Behavior During Therapy: WFL for tasks assessed/performed   PT - Cognitive impairments:  No apparent impairments                       PT - Cognition Comments: Pleasant and agreeable to PT session Following commands: Impaired Following commands impaired: Follows one step  commands with increased time    Cueing Cueing Techniques: Verbal cues, Tactile cues, Visual cues  Exercises General Exercises - Lower Extremity Ankle Circles/Pumps: AROM, Both, 10 reps Long Arc Quad: AROM, Both, 10 reps    General Comments General comments (skin integrity, edema, etc.):  (Sitting BP 115/65 HR 78, Standing after 2-3 minutes, BP 84/50 HR 96 +symptomatic)      Pertinent Vitals/Pain Pain Assessment Pain Assessment: 0-10 Pain Score: 4  Pain Location: R Hip Pain Descriptors / Indicators: Discomfort, Guarding Pain Intervention(s): Monitored during session    Home Living                          Prior Function            PT Goals (current goals can now be found in the care plan section) Acute Rehab PT Goals Patient Stated Goal: to get better and stronger    Frequency    Min 3X/week      PT Plan      Co-evaluation              AM-PAC PT 6 Clicks Mobility   Outcome Measure  Help needed turning from your back to your side while in a flat bed without using bedrails?: A Lot Help needed moving from lying on your back to sitting on the side of a flat bed without using bedrails?: A Little Help needed moving to and from a bed to a chair (including a wheelchair)?: A Little Help needed standing up from a chair using your arms (e.g., wheelchair or bedside chair)?: A Little Help needed to walk in hospital room?: A Little Help needed climbing 3-5 steps with a railing? : A Lot 6 Click Score: 16    End of Session Equipment Utilized During Treatment: Gait belt Activity Tolerance: Treatment limited secondary to medical complications (Comment) (Orthostatic) Patient left: in chair;with call bell/phone within reach;with chair alarm set Nurse Communication: Mobility status;Other (comment) (Drop in BP) PT Visit Diagnosis: Unsteadiness on feet (R26.81);Muscle weakness (generalized) (M62.81);Difficulty in walking, not elsewhere classified (R26.2)      Time: 8867-8788 PT Time Calculation (min) (ACUTE ONLY): 39 min  Charges:    $Gait Training: 8-22 mins $Therapeutic Exercise: 8-22 mins $Therapeutic Activity: 8-22 mins PT General Charges $$ ACUTE PT VISIT: 1 Visit                    Darice Bohr, PTA  Darice JAYSON Bohr 06/21/2024, 12:32 PM

## 2024-06-21 NOTE — TOC Progression Note (Signed)
 Transition of Care [facility]) - Progression Note    Patient Details  Name: Michele Brewer MRN: [id number] Date of Birth: 07-14-1951  Transition of [facility]) CM/SW Contact  Marinda Cooks, RN Phone Number: 06/21/2024, 4:09 PM  Clinical Narrative:     This CM provided pt her bed offers at bedside and confirmed her choice selection is [facility]. This CM called and spoke with admission liaison Tammy and confirmed bed offer , and informed ins shara will be started . TOC will cont to follow dc planning /care coordination and update as applicable.                     Expected Discharge Plan and Services         Expected Discharge Date: 06/19/24                                     Social Drivers of Health (SDOH) Interventions SDOH Screenings   Food Insecurity: No Food Insecurity (06/17/2024)  Housing: Low Risk  (06/17/2024)  Transportation Needs: No Transportation Needs (06/17/2024)  Utilities: Not At Risk (06/17/2024)  Financial Resource Strain: Low Risk  (06/12/2024)   Received from [facility]  Social Connections: Moderately Isolated (06/17/2024)  Tobacco Use: Low Risk  (06/17/2024)    Readmission Risk Interventions     No data to display

## 2024-06-21 NOTE — Progress Notes (Signed)
 Subjective: 4 Days Post-Op Procedure(s) (LRB): HEMIARTHROPLASTY (BIPOLAR) HIP, POSTERIOR APPROACH FOR FRACTURE (Right) Patient reports pain as mild in the right hip. Patient is well, and has had no acute complaints or problems Denies any CP, SOB, ABD pain. We will continue therapy today.  Small BM yesterday.   Objective: Vital signs in last 24 hours: Temp:  [97.9 F (36.6 C)-98.7 F (37.1 C)] 97.9 F (36.6 C) (10/04 0423) Pulse Rate:  [63-96] 63 (10/04 0423) Resp:  [15-16] 15 (10/04 0423) BP: (112-133)/(61-68) 124/62 (10/04 0423) SpO2:  [98 %-100 %] 99 % (10/04 0423)  Intake/Output from previous day: 10/03 0701 - 10/04 0700 In: 360 [P.O.:360] Out: -  Intake/Output this shift: No intake/output data recorded.  Recent Labs    06/19/24 0233 06/20/24 0433  HGB 10.1* 10.0*   Recent Labs    06/19/24 0233 06/20/24 0433  WBC 8.8 7.1  RBC 3.19* 3.13*  HCT 29.2* 28.4*  PLT 141* 145*   Recent Labs    06/19/24 0233 06/20/24 0433  NA 135 137  K 4.3 4.4  CL 106 104  CO2 24 26  BUN 26* 31*  CREATININE 0.70 0.70  GLUCOSE 107* 111*  CALCIUM 8.8* 8.9   No results for input(s): LABPT, INR in the last 72 hours.  EXAM General - Patient is Alert, Appropriate, and Oriented Extremity - Neurovascular intact Sensation intact distally Intact pulses distally Dorsiflexion/Plantar flexion intact No cellulitis present Compartment soft Dressing - dressing C/D/I and scant drainage Motor Function - intact, moving foot and toes well on exam.   Past Medical History:  Diagnosis Date   Depression    Elevated CK 06/03/2023   Parkinson disease (HCC)     Assessment/Plan:   4 Days Post-Op Procedure(s) (LRB): HEMIARTHROPLASTY (BIPOLAR) HIP, POSTERIOR APPROACH FOR FRACTURE (Right) Principal Problem:   Closed right hip fracture (HCC) Active Problems:   Rupture of ligament of right wrist  Estimated body mass index is 25.75 kg/m as calculated from the following:   Height as  of this encounter: 5' 4 (1.626 m).   Weight as of this encounter: 68 kg. Advance diet Up with therapy Pain well controlled VSS + BM CM to assist with discharge to SNF   Following discharge, Continue Lovenox  40 daily for 14 days. Follow-up with [facility] in 10-14 days for staple removal and x-rays.  DVT Prophylaxis - Lovenox , TED hose, and SCDs Weight-Bearing as tolerated to right leg  T. Medford Amber, PA-C [facility] 06/21/2024, 6:46 AM

## 2024-06-22 DIAGNOSIS — S72001A Fracture of unspecified part of neck of right femur, initial encounter for closed fracture: Secondary | ICD-10-CM | POA: Diagnosis not present

## 2024-06-22 DIAGNOSIS — G20B1 Parkinson's disease with dyskinesia, without mention of fluctuations: Secondary | ICD-10-CM | POA: Diagnosis not present

## 2024-06-22 DIAGNOSIS — S63301A Traumatic rupture of unspecified ligament of right wrist, initial encounter: Secondary | ICD-10-CM | POA: Diagnosis not present

## 2024-06-22 NOTE — Progress Notes (Addendum)
 [facility]  - [place] [facility]   PATIENT NAME: Michele Brewer    MR#:  [id number]  DATE OF BIRTH:  12-24-1950  SUBJECTIVE:  no family at bedside.  Sitting up in bed. She had some dizziness with PT but tolerated it ok. Encouraged to drink fluids to avoid drop in bp  VITALS:  Blood pressure 120/64, pulse 72, temperature 97.7 F (36.5 C), resp. rate 19, height 5' 4 (1.626 m), weight 68 kg, SpO2 99%.  PHYSICAL EXAMINATION:   GENERAL:  73 y.o.-year-old patient with no acute distress.  LUNGS: Normal breath sounds bilaterally CARDIOVASCULAR: S1, S2 normal. No murmur   ABDOMEN: Soft, nontender, nondistended. Bowel sounds present.  EXTREMITIES: No  edema b/l.   Surgical dressing present right hip NEUROLOGIC: nonfocal  patient is alert and awake  LABORATORY PANEL:  CBC Recent Labs  Lab 06/20/24 0433  WBC 7.1  HGB 10.0*  HCT 28.4*  PLT 145*    Chemistries  Recent Labs  Lab 06/17/24 0219 06/18/24 0543 06/20/24 0433  NA 138   < > 137  K 4.0   < > 4.4  CL 101   < > 104  CO2 26   < > 26  GLUCOSE 110*   < > 111*  BUN 25*   < > 31*  CREATININE 0.78   < > 0.70  CALCIUM 9.4   < > 8.9  MG  --    < > 2.1  AST 28  --   --   ALT 18  --   --   ALKPHOS 98  --   --   BILITOT 1.1  --   --    < > = values in this interval not displayed.   Assessment and Plan Per H and P Michele Brewer is a 73 y.o. female with PMH of Parkinson disease, depression, as reviewed from EMR, presented to [facility] ED after mechanical fall yesterday at home.  Patient fell on her right side, complaining of severe right hip pain and unable to bear weight.  Patient also complaining of pain in her right wrist.  Denies any head injury.   Patient does not wanted blood transfusion because she is Jehovah's Witness.  Right hip x-ray: Acute fracture of the proximal right femur.    Right wrist x-ray: 1. No acute fracture. 2. Dorsal positioning of the distal right ulna, Correlation with physical  examination is recommended to determine the presence of an acute ligamentous injury.  #Displaced right femoral neck fracture  --S/p mechanical fall at home  --Continue prn meds for pain control --Continue fall precautions --Orthopedic surgery Dr Edie consulted --s/p Right hip bipolar hemiarthroplasty.  --PT/OT/Toc--pt medically stable and waiting for rehab bed    # Right wrist sprain with possible distal radio-ulnar joint subluxation.  --Continue Velcro splint as per orthopedics --Continue prn pain meds   # Iron deficiency Venofer 3 mg IV x 1 dose ordered by Dr Von --Start oral iron supplement with vitamin C  for discharge --F/u with PCP to repeat iron profile after 3 to 6 months   # Vitamin B12 deficiency: B12 level 211, goal >400.   Started vitamin B12 1000 oral supplement.   Follow-up PCP to repeat vitamin B12 level after 3 to 6 months.   # Folic acid  level 5.4 at lower end, started folic acid  1 mg p.o. daily to prevent deficiency.   # Vitamin D  insufficiency: started vitamin D  50,000 units p.o. weekly, follow with PCP to repeat vitamin  D level after 3 to 6 months.   # Parkinson disease: Continued Sinemet  home dose  # Depression: Continued Wellbutrin  and Cymbalta  at home dose   Nutrition: Cardiac diet DVT Prophylaxis: Subcutaneous Lovenox   PT/OT recs Rehab--TOC for d/c planning.     Procedures: right hip hemi arthroplasty Family communication : none today Consults : orthopedic CODE STATUS: full DVT Prophylaxis : Lovenox  for 14 days and then ASA for 1 month--per Dr Edie Level of care: Telemetry Medical Status is: Inpatient Remains inpatient appropriate because: overall doing well. Medically best at baseline for discharge to rehab    TOTAL TIME TAKING CARE OF THIS PATIENT: 35 minutes.  >50% time spent on counselling and coordination of care  Note: This dictation was prepared with Dragon dictation along with smaller phrase technology. Any transcriptional errors  that result from this process are unintentional.  [name] M.D    [facility]   CC: Primary care physician; [name], MD

## 2024-06-22 NOTE — Progress Notes (Addendum)
 Subjective: 5 Days Post-Op Procedure(s) (LRB): HEMIARTHROPLASTY (BIPOLAR) HIP, POSTERIOR APPROACH FOR FRACTURE (Right) Patient reports pain as mild in the right hip. Patient is well, and has had no acute complaints or problems Denies any CP, SOB, ABD pain. We will continue therapy today.    Objective: Vital signs in last 24 hours: Temp:  [97.1 F (36.2 C)-98.4 F (36.9 C)] 97.1 F (36.2 C) (10/05 0430) Pulse Rate:  [60-89] 70 (10/05 0430) Resp:  [16-18] 18 (10/05 0430) BP: (104-127)/(57-67) 121/57 (10/05 0430) SpO2:  [100 %] 100 % (10/05 0430)  Intake/Output from previous day: 10/04 0701 - 10/05 0700 In: -  Out: 400 [Urine:400] Intake/Output this shift: No intake/output data recorded.  Recent Labs    06/20/24 0433  HGB 10.0*   Recent Labs    06/20/24 0433  WBC 7.1  RBC 3.13*  HCT 28.4*  PLT 145*   Recent Labs    06/20/24 0433  NA 137  K 4.4  CL 104  CO2 26  BUN 31*  CREATININE 0.70  GLUCOSE 111*  CALCIUM 8.9   No results for input(s): LABPT, INR in the last 72 hours.  EXAM General - Patient is Alert, Appropriate, and Oriented Extremity - Neurovascular intact Sensation intact distally Intact pulses distally Dorsiflexion/Plantar flexion intact No cellulitis present Compartment soft Dressing - dressing C/D/I and scant drainage Motor Function - intact, moving foot and toes well on exam.   Past Medical History:  Diagnosis Date   Depression    Elevated CK 06/03/2023   Parkinson disease (HCC)     Assessment/Plan:   5 Days Post-Op Procedure(s) (LRB): HEMIARTHROPLASTY (BIPOLAR) HIP, POSTERIOR APPROACH FOR FRACTURE (Right) Principal Problem:   Closed right hip fracture (HCC) Active Problems:   Rupture of ligament of right wrist  Estimated body mass index is 25.75 kg/m as calculated from the following:   Height as of this encounter: 5' 4 (1.626 m).   Weight as of this encounter: 68 kg. Advance diet Up with therapy Pain well  controlled VSS + BM CM to assist with discharge to SNF Ortho to sign off, will follow peripherally    Following discharge, Continue Lovenox  40 daily for 14 days. Follow-up with [facility] in 10-14 days for staple removal and x-rays.  DVT Prophylaxis - Lovenox , TED hose, and SCDs Weight-Bearing as tolerated to right leg  T. Medford Amber, PA-C [facility] 06/22/2024, 7:36 AM

## 2024-06-22 NOTE — Progress Notes (Signed)
 Physical Therapy Treatment Patient Details Name: Michele Brewer MRN: [id number] DOB: [birth year] Today's Date: 06/22/2024   History of Present Illness Pt is a 73 year old female presenting to the ED after mechanical fall at home, now s/p Right hip bipolar hemiarthroplasty. 06/17/24  Right wrist sprain with possible distal radio-ulnar joint subluxation.   --Continue Velcro splint as per orthopedics    PMH significant for Parkinson disease, depression    PT Comments  Ready for session.  Orthostatic vitals taken.  She remains with BP drop - see flow sheets but is asymptomatic.  MD aware.  She is motivated to participate and after voiding on BSC, seated ex, she is able to walk 50' with slow shuffling gait.  Cues to take long steps and to step up in to walker.  Presents with typical Parkinson gait but pt stated she walks normally at baseline and she attributes gait to hip pain more than Parkinsons.  She remains up in chair with needs met.  Recliner follow by tech due to BP's but not needed during session.   If plan is discharge home, recommend the following: A little help with walking and/or transfers;A little help with bathing/dressing/bathroom;Assistance with cooking/housework;Assist for transportation;Help with stairs or ramp for entrance   Can travel by private vehicle        Equipment Recommendations       Recommendations for Other Services       Precautions / Restrictions Precautions Precautions: Posterior Hip;Fall Recall of Precautions/Restrictions: Impaired Required Braces or Orthoses: Splint/Cast Splint/Cast: R wrist splint Restrictions Weight Bearing Restrictions Per Provider Order: Yes RUE Weight Bearing Per Provider Order: Weight bearing as tolerated RLE Weight Bearing Per Provider Order: Weight bearing as tolerated     Mobility  Bed Mobility Overal bed mobility: Needs Assistance       Supine to sit: Min assist, HOB elevated, Used rails, Mod assist     General bed  mobility comments: difficulty getting to EOB with R wrist splint Patient Response: Cooperative  Transfers Overall transfer level: Needs assistance Equipment used: Rolling walker (2 wheels) Transfers: Sit to/from Stand Sit to Stand: Min assist           General transfer comment: some inc post lean today with transitions    Ambulation/Gait Ambulation/Gait assistance: Contact guard assist Gait Distance (Feet): 50 Feet Assistive device: Rolling walker (2 wheels) Gait Pattern/deviations: Decreased stance time - right, Antalgic, Step-through pattern, Decreased step length - right, Decreased step length - left Gait velocity: dec     General Gait Details: cues to inc step length and height and improve walker position   Stairs             Wheelchair Mobility     Tilt Bed Tilt Bed Patient Response: Cooperative  Modified Rankin (Stroke Patients Only)       Balance Overall balance assessment: Needs assistance, History of Falls Sitting-balance support: Feet supported Sitting balance-Leahy Scale: Good     Standing balance support: During functional activity, Reliant on assistive device for balance, Single extremity supported, Bilateral upper extremity supported Standing balance-Leahy Scale: Fair                              Hotel manager: No apparent difficulties  Cognition Arousal: Alert Behavior During Therapy: WFL for tasks assessed/performed   PT - Cognitive impairments: No apparent impairments  PT - Cognition Comments: Pleasant and agreeable to PT session Following commands: Impaired Following commands impaired: Follows one step commands with increased time, Only follows one step commands consistently    Cueing Cueing Techniques: Verbal cues, Tactile cues, Visual cues  Exercises Other Exercises Other Exercises: seated AROM Other Exercises: to [facility] to void    General Comments         Pertinent Vitals/Pain Pain Assessment Pain Assessment: Faces Faces Pain Scale: Hurts little more Pain Location: R Hip Pain Descriptors / Indicators: Discomfort, Guarding Pain Intervention(s): Limited activity within patient's tolerance, Monitored during session, Repositioned    Home Living                          Prior Function            PT Goals (current goals can now be found in the care plan section) Progress towards PT goals: Progressing toward goals    Frequency    7X/week      PT Plan      Co-evaluation              AM-PAC PT 6 Clicks Mobility   Outcome Measure  Help needed turning from your back to your side while in a flat bed without using bedrails?: A Little Help needed moving from lying on your back to sitting on the side of a flat bed without using bedrails?: A Little Help needed moving to and from a bed to a chair (including a wheelchair)?: A Little Help needed standing up from a chair using your arms (e.g., wheelchair or bedside chair)?: A Little Help needed to walk in hospital room?: A Little Help needed climbing 3-5 steps with a railing? : A Lot 6 Click Score: 17    End of Session Equipment Utilized During Treatment: Gait belt Activity Tolerance: Patient tolerated treatment well Patient left: in chair;with call bell/phone within reach;with chair alarm set Nurse Communication: Mobility status;Other (comment) PT Visit Diagnosis: Unsteadiness on feet (R26.81);Muscle weakness (generalized) (M62.81);Difficulty in walking, not elsewhere classified (R26.2)     Time: 8794-8759 PT Time Calculation (min) (ACUTE ONLY): 35 min  Charges:    $Gait Training: 8-22 mins $Therapeutic Activity: 8-22 mins PT General Charges $$ ACUTE PT VISIT: 1 Visit                   Lauraine Gills, PTA 06/22/24, 1:03 PM

## 2024-06-23 DIAGNOSIS — N390 Urinary tract infection, site not specified: Secondary | ICD-10-CM | POA: Diagnosis not present

## 2024-06-23 DIAGNOSIS — R2689 Other abnormalities of gait and mobility: Secondary | ICD-10-CM | POA: Diagnosis not present

## 2024-06-23 DIAGNOSIS — S72143A Displaced intertrochanteric fracture of unspecified femur, initial encounter for closed fracture: Secondary | ICD-10-CM | POA: Diagnosis not present

## 2024-06-23 DIAGNOSIS — S72001A Fracture of unspecified part of neck of right femur, initial encounter for closed fracture: Secondary | ICD-10-CM | POA: Diagnosis not present

## 2024-06-23 DIAGNOSIS — D649 Anemia, unspecified: Secondary | ICD-10-CM | POA: Diagnosis not present

## 2024-06-23 DIAGNOSIS — F329 Major depressive disorder, single episode, unspecified: Secondary | ICD-10-CM | POA: Diagnosis not present

## 2024-06-23 DIAGNOSIS — S72001D Fracture of unspecified part of neck of right femur, subsequent encounter for closed fracture with routine healing: Secondary | ICD-10-CM | POA: Diagnosis not present

## 2024-06-23 DIAGNOSIS — E559 Vitamin D deficiency, unspecified: Secondary | ICD-10-CM | POA: Diagnosis not present

## 2024-06-23 DIAGNOSIS — E538 Deficiency of other specified B group vitamins: Secondary | ICD-10-CM | POA: Diagnosis not present

## 2024-06-23 DIAGNOSIS — D509 Iron deficiency anemia, unspecified: Secondary | ICD-10-CM | POA: Diagnosis not present

## 2024-06-23 DIAGNOSIS — M6259 Muscle wasting and atrophy, not elsewhere classified, multiple sites: Secondary | ICD-10-CM | POA: Diagnosis not present

## 2024-06-23 DIAGNOSIS — Z7401 Bed confinement status: Secondary | ICD-10-CM | POA: Diagnosis not present

## 2024-06-23 DIAGNOSIS — F32A Depression, unspecified: Secondary | ICD-10-CM | POA: Diagnosis not present

## 2024-06-23 DIAGNOSIS — R42 Dizziness and giddiness: Secondary | ICD-10-CM | POA: Diagnosis not present

## 2024-06-23 DIAGNOSIS — I10 Essential (primary) hypertension: Secondary | ICD-10-CM | POA: Diagnosis not present

## 2024-06-23 DIAGNOSIS — Z471 Aftercare following joint replacement surgery: Secondary | ICD-10-CM | POA: Diagnosis not present

## 2024-06-23 DIAGNOSIS — S63301A Traumatic rupture of unspecified ligament of right wrist, initial encounter: Secondary | ICD-10-CM | POA: Diagnosis not present

## 2024-06-23 DIAGNOSIS — R4789 Other speech disturbances: Secondary | ICD-10-CM | POA: Diagnosis not present

## 2024-06-23 DIAGNOSIS — M6281 Muscle weakness (generalized): Secondary | ICD-10-CM | POA: Diagnosis not present

## 2024-06-23 DIAGNOSIS — E569 Vitamin deficiency, unspecified: Secondary | ICD-10-CM | POA: Diagnosis not present

## 2024-06-23 DIAGNOSIS — G20A1 Parkinson's disease without dyskinesia, without mention of fluctuations: Secondary | ICD-10-CM | POA: Diagnosis not present

## 2024-06-23 DIAGNOSIS — G20B1 Parkinson's disease with dyskinesia, without mention of fluctuations: Secondary | ICD-10-CM | POA: Diagnosis not present

## 2024-06-23 DIAGNOSIS — R278 Other lack of coordination: Secondary | ICD-10-CM | POA: Diagnosis not present

## 2024-06-23 DIAGNOSIS — Z5189 Encounter for other specified aftercare: Secondary | ICD-10-CM | POA: Diagnosis not present

## 2024-06-23 DIAGNOSIS — W19XXXD Unspecified fall, subsequent encounter: Secondary | ICD-10-CM | POA: Diagnosis not present

## 2024-06-23 DIAGNOSIS — M792 Neuralgia and neuritis, unspecified: Secondary | ICD-10-CM | POA: Diagnosis not present

## 2024-06-23 DIAGNOSIS — S63301D Traumatic rupture of unspecified ligament of right wrist, subsequent encounter: Secondary | ICD-10-CM | POA: Diagnosis not present

## 2024-06-23 NOTE — Progress Notes (Signed)
 The Clinical research associate called [employer] and gave report to Sana, Charity fundraiser. Pt denies any pain or discomfort at this time. Will continue to monitor until her pick up.

## 2024-06-23 NOTE — TOC Transition Note (Signed)
 Transition of Care [facility]) - Discharge Note   Patient Details  Name: Michele Brewer MRN: [id number] Date of Birth: [birth year]  [facility] CM/SW Contact:  Alvaro Louder, LCSW Phone Number: 06/23/2024, 4:32 PM   Clinical Narrative:   ISRAEL received insurance auth approval from [facility] (Auth Number [id number]) for patient to admit to SNF. LCSWA confirmed with MD that patient is stable for discharge. LCSWA notified the patient and they are in agreement with discharge. LCSWA confirmed bed is available at SNF. Transport arranged with [facility] for next available.  Number to call report, [phone] RM: 100B         Patient Goals and CMS Choice            Discharge Placement                       Discharge Plan and Services Additional resources added to the After Visit Summary for                                       Social Drivers of Health (SDOH) Interventions SDOH Screenings   Food Insecurity: No Food Insecurity (06/17/2024)  Housing: Low Risk  (06/17/2024)  Transportation Needs: No Transportation Needs (06/17/2024)  Utilities: Not At Risk (06/17/2024)  Financial Resource Strain: Low Risk  (06/12/2024)   Received from [facility]  Social Connections: Moderately Isolated (06/17/2024)  Tobacco Use: Low Risk  (06/17/2024)     Readmission Risk Interventions     No data to display

## 2024-06-23 NOTE — Plan of Care (Signed)

## 2024-06-23 NOTE — Progress Notes (Signed)
 Occupational Therapy Treatment Patient Details Name: Michele Brewer MRN: [id number] DOB: 13-Apr-1951 Today's Date: 06/23/2024   History of present illness Pt is a 73 year old female presenting to the ED after mechanical fall at home, now s/p Right hip bipolar hemiarthroplasty. 06/17/24  Right wrist sprain with possible distal radio-ulnar joint subluxation.   --Continue Velcro splint as per orthopedics    PMH significant for Parkinson disease, depression   OT comments  Pt seen for OT tx. Pt received in the recliner, endorsing increasing pain. RN notified. Assist for repositioning in the recliner for improved comfort. Pt edu in cognitive behavioral pain coping strategies to support improved pain control/comfort in coordination with medical pain mgt. Pt verbalized understanding. Pt continues to benefit from skilled OT services.       If plan is discharge home, recommend the following:  A little help with walking and/or transfers;A lot of help with bathing/dressing/bathroom   Equipment Recommendations  Other (comment) (defer)    Recommendations for Other Services      Precautions / Restrictions Precautions Precautions: Posterior Hip;Fall Precaution Booklet Issued: No Recall of Precautions/Restrictions: Impaired Required Braces or Orthoses: Splint/Cast Splint/Cast: R wrist splint Restrictions Weight Bearing Restrictions Per Provider Order: Yes RUE Weight Bearing Per Provider Order: Weight bearing as tolerated RLE Weight Bearing Per Provider Order: Weight bearing as tolerated       Mobility Bed Mobility               General bed mobility comments: in recliner before and after    Transfers                   General transfer comment: pt declined 2/2 pain, assist for repositioning in recliner for improved comfort and positioning for eating     Balance Overall balance assessment: Needs assistance, History of Falls Sitting-balance support: Feet supported Sitting  balance-Leahy Scale: Good                                     ADL either performed or assessed with clinical judgement   ADL Overall ADL's : Needs assistance/impaired Eating/Feeding: Set up;Sitting                                          Extremity/Trunk Assessment              Vision       Perception     Praxis     Communication Communication Communication: No apparent difficulties Factors Affecting Communication: Reduced clarity of speech   Cognition Arousal: Alert Behavior During Therapy: WFL for tasks assessed/performed                                 Following commands: Impaired Following commands impaired: Follows one step commands with increased time      Cueing   Cueing Techniques: Verbal cues  Exercises Other Exercises Other Exercises: Pt edu in cognitive behavioral pain coping strategies to support improved pain control/comfort in coordination with medical pain mgt    Shoulder Instructions       General Comments      Pertinent Vitals/ Pain       Pain Assessment Pain Assessment: 0-10 Pain Score: 8  Pain Location: R Hip Pain  Descriptors / Indicators: Discomfort, Guarding, Spasm Pain Intervention(s): Limited activity within patient's tolerance, Monitored during session, Repositioned, Patient requesting pain meds-RN notified  Home Living                                          Prior Functioning/Environment              Frequency  Min 2X/week        Progress Toward Goals  OT Goals(current goals can now be found in the care plan section)  Progress towards OT goals: Progressing toward goals  Acute Rehab OT Goals Patient Stated Goal: to feel better OT Goal Formulation: With patient Time For Goal Achievement: 07/02/24 Potential to Achieve Goals: Good  Plan      Co-evaluation                 AM-PAC OT 6 Clicks Daily Activity     Outcome Measure   Help  from another person eating meals?: None Help from another person taking care of personal grooming?: A Little Help from another person toileting, which includes using toliet, bedpan, or urinal?: A Little Help from another person bathing (including washing, rinsing, drying)?: A Lot Help from another person to put on and taking off regular upper body clothing?: A Little Help from another person to put on and taking off regular lower body clothing?: A Lot 6 Click Score: 17    End of Session    OT Visit Diagnosis: Other abnormalities of gait and mobility (R26.89)   Activity Tolerance Patient limited by pain   Patient Left in chair;with call bell/phone within reach;with chair alarm set   Nurse Communication Patient requests pain meds        Time: 8842-8793 OT Time Calculation (min): 9 min  Charges: OT General Charges $OT Visit: 1 Visit OT Treatments $Therapeutic Activity: 8-22 mins  Warren SAUNDERS., MPH, MS, OTR/L ascom [phone] 06/23/24, 1:10 PM,

## 2024-06-23 NOTE — Progress Notes (Signed)
 Physical Therapy Treatment Patient Details Name: Michele Brewer MRN: [id number] DOB: 06-09-1951 Today's Date: 06/23/2024   History of Present Illness Pt is a 73 year old female presenting to the ED after mechanical fall at home, now s/p Right hip bipolar hemiarthroplasty. 06/17/24  Right wrist sprain with possible distal radio-ulnar joint subluxation.   --Continue Velcro splint as per orthopedics    PMH significant for Parkinson disease, depression    PT Comments  Pt up in chair with nursing staff.  C/o spasms today and general discomfort.  Medicated and returned later.  She continues with discomfort but agrees to try to move.  She stands with some increased assist and support today.  Spasms continue and she is encouraged to do AROM in standing but does not decrease pain/spasms.  She stated WB is difficult at this time.  Nausea increases once up and she requests to sit due to general malaise.  Remains in chair with needs met.   If plan is discharge home, recommend the following: A little help with walking and/or transfers;A little help with bathing/dressing/bathroom;Assistance with cooking/housework;Assist for transportation;Help with stairs or ramp for entrance   Can travel by private vehicle        Equipment Recommendations       Recommendations for Other Services       Precautions / Restrictions Precautions Precautions: Posterior Hip;Fall Recall of Precautions/Restrictions: Impaired Required Braces or Orthoses: Splint/Cast Splint/Cast: R wrist splint Restrictions Weight Bearing Restrictions Per Provider Order: Yes RUE Weight Bearing Per Provider Order: Weight bearing as tolerated RLE Weight Bearing Per Provider Order: Weight bearing as tolerated     Mobility  Bed Mobility               General bed mobility comments: in recliner before and after Patient Response: Cooperative  Transfers Overall transfer level: Needs assistance Equipment used: Rolling walker (2  wheels) Transfers: Sit to/from Stand Sit to Stand: Min assist                Ambulation/Gait         Gait velocity: dec     General Gait Details: unable this attempt due to spasms and difficutly with WB   Stairs             Wheelchair Mobility     Tilt Bed Tilt Bed Patient Response: Cooperative  Modified Rankin (Stroke Patients Only)       Balance Overall balance assessment: Needs assistance, History of Falls Sitting-balance support: Feet supported Sitting balance-Leahy Scale: Good     Standing balance support: During functional activity, Reliant on assistive device for balance, Single extremity supported, Bilateral upper extremity supported Standing balance-Leahy Scale: Poor Standing balance comment: some increased assist and reliance of RW today                            Communication Communication Communication: No apparent difficulties Factors Affecting Communication: Reduced clarity of speech  Cognition Arousal: Alert Behavior During Therapy: WFL for tasks assessed/performed   PT - Cognitive impairments: No apparent impairments                       PT - Cognition Comments: Pleasant and agreeable to PT session Following commands: Impaired Following commands impaired: Follows one step commands with increased time, Only follows one step commands consistently    Cueing Cueing Techniques: Verbal cues, Tactile cues, Visual cues  Exercises Other Exercises Other Exercises:  standing AROM in attempt to decrease spasms but not helpful    General Comments        Pertinent Vitals/Pain Pain Assessment Pain Assessment: Faces Faces Pain Scale: Hurts even more Pain Location: R Hip Pain Descriptors / Indicators: Discomfort, Guarding, Spasm Pain Intervention(s): Limited activity within patient's tolerance, Monitored during session, Premedicated before session, Repositioned    Home Living                           Prior Function            PT Goals (current goals can now be found in the care plan section) Progress towards PT goals: Progressing toward goals    Frequency    7X/week      PT Plan      Co-evaluation              AM-PAC PT 6 Clicks Mobility   Outcome Measure  Help needed turning from your back to your side while in a flat bed without using bedrails?: A Little Help needed moving from lying on your back to sitting on the side of a flat bed without using bedrails?: A Little Help needed moving to and from a bed to a chair (including a wheelchair)?: A Little Help needed standing up from a chair using your arms (e.g., wheelchair or bedside chair)?: A Little Help needed to walk in hospital room?: A Lot Help needed climbing 3-5 steps with a railing? : A Lot 6 Click Score: 16    End of Session Equipment Utilized During Treatment: Gait belt Activity Tolerance: Patient limited by pain Patient left: in chair;with call bell/phone within reach;with chair alarm set Nurse Communication: Mobility status;Other (comment) PT Visit Diagnosis: Unsteadiness on feet (R26.81);Muscle weakness (generalized) (M62.81);Difficulty in walking, not elsewhere classified (R26.2)     Time: 8963-8955 PT Time Calculation (min) (ACUTE ONLY): 8 min  Charges:    $Therapeutic Activity: 8-22 mins PT General Charges $$ ACUTE PT VISIT: 1 Visit                   Lauraine Gills, PTA 06/23/24, 10:53 AM

## 2024-06-23 NOTE — Plan of Care (Signed)
   Problem: Education: Goal: Knowledge of General Education information will improve Description Including pain rating scale, medication(s)/side effects and non-pharmacologic comfort measures Outcome: Progressing   Problem: Health Behavior/Discharge Planning: Goal: Ability to manage health-related needs will improve Outcome: Progressing

## 2024-06-23 NOTE — Discharge Summary (Signed)
 Physician Discharge Summary   Patient: Michele Brewer MRN: [id number] DOB: 26-Sep-1950  Admit date:     06/17/2024  Discharge date: 06/23/24  Discharge Physician: [name]   PCP: [name], MD   Recommendations at discharge:    F/u PCP in 1-2 weeks F/u  Dr edie in 2 weeks post op check up  Discharge Diagnoses: Principal Problem:   Closed right hip fracture [facility]) Active Problems:   Rupture of ligament of right wrist  Michele Brewer is a 73 y.o. female with PMH of Parkinson disease, depression, as reviewed from EMR, presented to [facility] ED after mechanical fall yesterday at home.  Patient fell on her right side, complaining of severe right hip pain and unable to bear weight.  Patient also complaining of pain in her right wrist.  Denies any head injury.    Patient does not wanted blood transfusion because she is Jehovah's Witness.   Right hip x-ray: Acute fracture of the proximal right femur.    Right wrist x-ray: 1. No acute fracture. 2. Dorsal positioning of the distal right ulna, Correlation with physical examination is recommended to determine the presence of an acute ligamentous injury.   #Displaced right femoral neck fracture  --S/p mechanical fall at home  --Continue prn meds for pain control --Continue fall precautions --Orthopedic surgery Dr edie consulted --s/p Right hip bipolar hemiarthroplasty.  --PT/OT/Toc--pt medically improving and waiting for rehab bed    # Right wrist sprain with possible distal radio-ulnar joint subluxation.  --Continue Velcro splint as per orthopedics --Continue prn pain meds   # Iron deficiency Venofer 3 mg IV x 1 dose ordered by Dr Von --Start oral iron supplement with vitamin C  for discharge --F/u with PCP to repeat iron profile after 3 to 6 months   # Vitamin B12 deficiency: B12 level 211, goal >400.   Started vitamin B12 1000 oral supplement.   Follow-up PCP to repeat vitamin B12 level after 3 to 6 months.   # Folic acid   level 5.4 at lower end, started folic acid  1 mg p.o. daily to prevent deficiency.   # Vitamin D  insufficiency: started vitamin D  50,000 units p.o. weekly, follow with PCP to repeat vitamin D  level after 3 to 6 months.   # Parkinson disease: Continued Sinemet   and gabapentin    # Depression: Continued Wellbutrin  and Cymbalta  at home dose     Nutrition: Cardiac diet DVT Prophylaxis: Subcutaneous Lovenox  for 14 days and asa 325 mg every day for 30 start after 14 days of lovenox    PT/OT recs Rehab--TOC for d/c planning once rehab bed found     Procedures: right hip hemi arthroplasty Family communication : none today--pt tells me family/friend aware Consults : orthopedic CODE STATUS: full DVT Prophylaxis : Lovenox      Pain control - [place]  Controlled Substance Reporting System database was reviewed. and patient was instructed, not to drive, operate heavy machinery, perform activities at heights, swimming or participation in water activities or provide baby-sitting services while on Pain, Sleep and Anxiety Medications; until their outpatient Physician has advised to do so again. Also recommended to not to take more than prescribed Pain, Sleep and Anxiety Medications.  DISCHARGE MEDICATION: Allergies as of 06/23/2024       Reactions   Citrus    rash   Peanut-containing Drug Products    Rash and itch        Medication List     TAKE these medications    acetaminophen  325 MG tablet Commonly  known as: TYLENOL  Take 2 tablets (650 mg total) by mouth every 6 (six) hours as needed for mild pain, moderate pain, fever or headache (or Fever >/= 101).   aspirin EC 325 MG tablet Take 1 tablet (325 mg total) by mouth daily. Start taking on: July 05, 2024   buPROPion  150 MG 24 hr tablet Commonly known as: WELLBUTRIN  XL Take 1 tablet (150 mg total) by mouth daily.   carbidopa -levodopa  25-100 MG tablet Commonly known as: SINEMET  IR Take 1.5 tablets by mouth 2 (two) times  daily.   carbidopa -levodopa  25-100 MG tablet Commonly known as: SINEMET  IR Take 1.5 tablets by mouth at bedtime.   cyanocobalamin  1000 MCG tablet Take 1 tablet (1,000 mcg total) by mouth daily.   docusate sodium 100 MG capsule Commonly known as: COLACE Take 1 capsule (100 mg total) by mouth 2 (two) times daily.   DULoxetine  30 MG capsule Commonly known as: CYMBALTA  Take 1 capsule (30 mg total) by mouth 2 (two) times daily.   enoxaparin  40 MG/0.4ML injection Commonly known as: LOVENOX  Inject 0.4 mLs (40 mg total) into the skin daily for 14 days.   feeding supplement (NEPRO CARB STEADY) Liqd Take 237 mLs by mouth 3 (three) times daily between meals.   fluticasone  50 MCG/ACT nasal spray Commonly known as: FLONASE  Place 2 sprays into both nostrils daily.   folic acid  1 MG tablet Commonly known as: FOLVITE  Take 1 tablet (1 mg total) by mouth daily.   gabapentin  100 MG capsule Commonly known as: NEURONTIN  Take 1 capsule (100 mg total) by mouth 3 (three) times daily. What changed: Another medication with the same name was removed. Continue taking this medication, and follow the directions you see here.   HYDROcodone-acetaminophen  5-325 MG tablet Commonly known as: NORCO/VICODIN Take 1-2 tablets by mouth every 6 (six) hours as needed for moderate pain (pain score 4-6) or severe pain (pain score 7-10).   meclizine  25 MG tablet Commonly known as: ANTIVERT  Take 1 tablet (25 mg total) by mouth 3 (three) times daily as needed for dizziness.   multivitamin with minerals Tabs tablet Take 1 tablet by mouth daily.   polyethylene glycol 17 g packet Commonly known as: MIRALAX  / GLYCOLAX  Take 17 g by mouth daily as needed for moderate constipation.   thiamine  100 MG tablet Commonly known as: Vitamin B-1 Take 1 tablet (100 mg total) by mouth daily.   Vitamin D  (Ergocalciferol ) 1.25 MG (50000 UNIT) Caps capsule Commonly known as: DRISDOL  Take 1 capsule (50,000 Units total) by  mouth once a week. What changed: Another medication with the same name was removed. Continue taking this medication, and follow the directions you see here.        Contact information for follow-up providers     [name], MD. Schedule an appointment as soon as possible for a visit in 1 week(s).   Specialty: Internal Medicine Contact information: [address] [place] [address] [phone]         Edie Norleen PARAS, MD. Schedule an appointment as soon as possible for a visit in 2 week(s).   Specialty: Orthopedic Surgery Contact information: [address] [facility] [place] [address] [phone]              Contact information for after-discharge care     Destination     [facility] .   Service: Skilled Nursing Contact information: [address] [place]  [phone]  Discharge Exam: Filed Weights   06/17/24 0039 06/17/24 1240  Weight: 68 kg 68 kg   GENERAL:  73 y.o.-year-old patient with no acute distress.  LUNGS: Normal breath sounds bilaterally, no wheezing CARDIOVASCULAR: S1, S2 normal. No murmur   ABDOMEN: Soft, nontender, nondistended. Bowel sounds present.  EXTREMITIES: No  edema b/l.   Surgical dressing present right hip NEUROLOGIC: nonfocal  patient is alert and awake    Condition at discharge: fair  The results of significant diagnostics from this hospitalization (including imaging, microbiology, ancillary and laboratory) are listed below for reference.   Imaging Studies: DG HIP UNILAT W OR W/O PELVIS 2-3 VIEWS RIGHT Result Date: 06/17/2024 CLINICAL DATA:  Status post hip hemiarthroplasty. EXAM: DG HIP (WITH OR WITHOUT PELVIS) 2-3V RIGHT COMPARISON:  Preoperative radiograph earlier today. FINDINGS: Right hip hemiarthroplasty in expected alignment. No periprosthetic lucency or fracture. Recent postsurgical change includes air  and edema in the soft tissues. Overlying skin staples in place. IMPRESSION: Right hip hemiarthroplasty without immediate postoperative complication. Electronically Signed   By: Andrea Gasman M.D.   On: 06/17/2024 17:17   DG Hip Unilat W or Wo Pelvis 2-3 Views Right Result Date: 06/17/2024 CLINICAL DATA:  Status post fall. EXAM: DG HIP (WITH OR WITHOUT PELVIS) 2-3V RIGHT COMPARISON:  None Available. FINDINGS: An acute fracture deformity is seen extending through the junction of the head and neck of the proximal right femur. Approximately 1/2 with dorsal displacement of the distal fracture site is noted. There is no evidence of dislocation. Degenerative changes are seen involving both hips, as well as the visualized portion of the lower lumbar spine. IMPRESSION: Acute fracture of the proximal right femur. Electronically Signed   By: Suzen Dials M.D.   On: 06/17/2024 01:41   DG Wrist Complete Right Result Date: 06/17/2024 CLINICAL DATA:  Status post fall. EXAM: RIGHT WRIST - COMPLETE 3+ VIEW COMPARISON:  None Available. FINDINGS: There is no evidence of an acute fracture. The distal right ulna is dorsal in position with respect to the distal right radius on the lateral view. This is of indeterminate age. Chronic and degenerative changes seen along the posterior aspect of the right wrist with additional degenerative changes present at the first right carpometacarpal joint. Soft tissues are unremarkable. IMPRESSION: 1. No acute fracture. 2. Dorsal positioning of the distal right ulna, as described above. Correlation with physical examination is recommended to determine the presence of an acute ligamentous injury. Electronically Signed   By: Suzen Dials M.D.   On: 06/17/2024 01:39    Microbiology: Results for orders placed or performed during the hospital encounter of 06/02/23  Resp Panel by RT-PCR (Flu A&B, Covid) Anterior Nasal Swab     Status: Abnormal   Collection Time: 06/02/23 10:40 AM    Specimen: Anterior Nasal Swab  Result Value Ref Range Status   SARS Coronavirus 2 by RT PCR POSITIVE (A) NEGATIVE Final    Comment: (NOTE) SARS-CoV-2 target nucleic acids are DETECTED.  The SARS-CoV-2 RNA is generally detectable in upper respiratory specimens during the acute phase of infection. Positive results are indicative of the presence of the identified virus, but do not rule out bacterial infection or co-infection with other pathogens not detected by the test. Clinical correlation with patient history and other diagnostic information is necessary to determine patient infection status. The expected result is Negative.  Fact Sheet for Patients: BloggerCourse.com  Fact Sheet for Healthcare Providers: SeriousBroker.it  This test is not yet approved or cleared by the [place]  [place] FDA and  has been authorized for detection and/or diagnosis of SARS-CoV-2 by FDA under an Emergency Use Authorization (EUA).  This EUA will remain in effect (meaning this test can be used) for the duration of  the COVID-19 declaration under Section 564(b)(1) of the A ct, 21 U.S.C. section 360bbb-3(b)(1), unless the authorization is terminated or revoked sooner.     Influenza A by PCR NEGATIVE NEGATIVE Final   Influenza B by PCR NEGATIVE NEGATIVE Final    Comment: (NOTE) The Xpert Xpress SARS-CoV-2/FLU/RSV plus assay is intended as an aid in the diagnosis of influenza from Nasopharyngeal swab specimens and should not be used as a sole basis for treatment. Nasal washings and aspirates are unacceptable for Xpert Xpress SARS-CoV-2/FLU/RSV testing.  Fact Sheet for Patients: BloggerCourse.com  Fact Sheet for Healthcare Providers: SeriousBroker.it  This test is not yet approved or cleared by the [place]  FDA and has been authorized for detection and/or diagnosis of SARS-CoV-2 by FDA under an  Emergency Use Authorization (EUA). This EUA will remain in effect (meaning this test can be used) for the duration of the COVID-19 declaration under Section 564(b)(1) of the Act, 21 U.S.C. section 360bbb-3(b)(1), unless the authorization is terminated or revoked.  Performed at [facility], [address] [place], [place] [address]   Urine Culture     Status: Abnormal   Collection Time: 06/06/23  3:00 PM   Specimen: Urine, Random  Result Value Ref Range Status   Specimen Description   Final    URINE, RANDOM Performed at [facility], [address], [place], [place] [address]    Special Requests   Final    NONE Performed at [facility], [address] [place], [place] [address]    Culture (A)  Final    <10,000 COLONIES/mL INSIGNIFICANT GROWTH Performed at [facility], [address], [place], [place] [address]    Report Status 06/07/2023 FINAL  Final    Labs: CBC: Recent Labs  Lab 06/17/24 0219 06/18/24 0543 06/19/24 0233 06/20/24 0433  WBC 7.2 7.7 8.8 7.1  NEUTROABS 6.4  --   --   --   HGB 11.9* 11.3* 10.1* 10.0*  HCT 33.4* 31.6* 29.2* 28.4*  MCV 93.0 95.5 91.5 90.7  PLT 167 147* 141* 145*   Basic Metabolic Panel: Recent Labs  Lab 06/17/24 0218 06/17/24 0219 06/18/24 0543 06/19/24 0233 06/20/24 0433  NA  --  138 138 135 137  K  --  4.0 4.5 4.3 4.4  CL  --  101 108 106 104  CO2  --  26 25 24 26   GLUCOSE  --  110* 118* 107* 111*  BUN  --  25* 21 26* 31*  CREATININE  --  0.78 0.72 0.70 0.70  CALCIUM  --  9.4 8.7* 8.8* 8.9  MG 2.1  --  2.0 2.1 2.1  PHOS 2.6  --  2.9 2.6 2.8   Liver Function Tests: Recent Labs  Lab 06/17/24 0219  AST 28  ALT 18  ALKPHOS 98  BILITOT 1.1  PROT 7.5  ALBUMIN 3.7   CBG: No results for input(s): GLUCAP in the last 168 hours.  Discharge time spent: greater than 30 minutes.  Signed: [name], MD [facility] 06/23/2024

## 2024-06-25 DIAGNOSIS — R42 Dizziness and giddiness: Secondary | ICD-10-CM | POA: Diagnosis not present

## 2024-06-25 DIAGNOSIS — E559 Vitamin D deficiency, unspecified: Secondary | ICD-10-CM | POA: Diagnosis not present

## 2024-06-25 DIAGNOSIS — Z5189 Encounter for other specified aftercare: Secondary | ICD-10-CM | POA: Diagnosis not present

## 2024-06-25 DIAGNOSIS — E538 Deficiency of other specified B group vitamins: Secondary | ICD-10-CM | POA: Diagnosis not present

## 2024-06-25 DIAGNOSIS — F329 Major depressive disorder, single episode, unspecified: Secondary | ICD-10-CM | POA: Diagnosis not present

## 2024-06-25 DIAGNOSIS — G20A1 Parkinson's disease without dyskinesia, without mention of fluctuations: Secondary | ICD-10-CM | POA: Diagnosis not present

## 2024-06-26 DIAGNOSIS — F329 Major depressive disorder, single episode, unspecified: Secondary | ICD-10-CM | POA: Diagnosis not present

## 2024-06-26 DIAGNOSIS — D649 Anemia, unspecified: Secondary | ICD-10-CM | POA: Diagnosis not present

## 2024-06-26 DIAGNOSIS — E538 Deficiency of other specified B group vitamins: Secondary | ICD-10-CM | POA: Diagnosis not present

## 2024-06-26 DIAGNOSIS — S72001D Fracture of unspecified part of neck of right femur, subsequent encounter for closed fracture with routine healing: Secondary | ICD-10-CM | POA: Diagnosis not present

## 2024-06-26 DIAGNOSIS — E559 Vitamin D deficiency, unspecified: Secondary | ICD-10-CM | POA: Diagnosis not present

## 2024-06-26 DIAGNOSIS — G20A1 Parkinson's disease without dyskinesia, without mention of fluctuations: Secondary | ICD-10-CM | POA: Diagnosis not present

## 2024-06-26 DIAGNOSIS — R42 Dizziness and giddiness: Secondary | ICD-10-CM | POA: Diagnosis not present

## 2024-06-26 DIAGNOSIS — Z5189 Encounter for other specified aftercare: Secondary | ICD-10-CM | POA: Diagnosis not present

## 2024-06-27 DIAGNOSIS — M792 Neuralgia and neuritis, unspecified: Secondary | ICD-10-CM | POA: Diagnosis not present

## 2024-06-27 DIAGNOSIS — S63301D Traumatic rupture of unspecified ligament of right wrist, subsequent encounter: Secondary | ICD-10-CM | POA: Diagnosis not present

## 2024-06-27 DIAGNOSIS — F32A Depression, unspecified: Secondary | ICD-10-CM | POA: Diagnosis not present

## 2024-06-27 DIAGNOSIS — F331 Major depressive disorder, recurrent, moderate: Secondary | ICD-10-CM | POA: Diagnosis not present

## 2024-06-27 DIAGNOSIS — R4789 Other speech disturbances: Secondary | ICD-10-CM | POA: Diagnosis not present

## 2024-06-27 DIAGNOSIS — N390 Urinary tract infection, site not specified: Secondary | ICD-10-CM | POA: Diagnosis not present

## 2024-06-27 DIAGNOSIS — M6259 Muscle wasting and atrophy, not elsewhere classified, multiple sites: Secondary | ICD-10-CM | POA: Diagnosis not present

## 2024-06-27 DIAGNOSIS — M25551 Pain in right hip: Secondary | ICD-10-CM | POA: Diagnosis not present

## 2024-06-27 DIAGNOSIS — E569 Vitamin deficiency, unspecified: Secondary | ICD-10-CM | POA: Diagnosis not present

## 2024-06-27 DIAGNOSIS — S72001D Fracture of unspecified part of neck of right femur, subsequent encounter for closed fracture with routine healing: Secondary | ICD-10-CM | POA: Diagnosis not present

## 2024-06-27 DIAGNOSIS — W19XXXD Unspecified fall, subsequent encounter: Secondary | ICD-10-CM | POA: Diagnosis not present

## 2024-06-27 DIAGNOSIS — G20A1 Parkinson's disease without dyskinesia, without mention of fluctuations: Secondary | ICD-10-CM | POA: Diagnosis not present

## 2024-06-27 DIAGNOSIS — D509 Iron deficiency anemia, unspecified: Secondary | ICD-10-CM | POA: Diagnosis not present

## 2024-07-03 DIAGNOSIS — S72001D Fracture of unspecified part of neck of right femur, subsequent encounter for closed fracture with routine healing: Secondary | ICD-10-CM | POA: Diagnosis not present

## 2024-07-07 DIAGNOSIS — M25551 Pain in right hip: Secondary | ICD-10-CM | POA: Diagnosis not present

## 2024-07-07 NOTE — Progress Notes (Signed)
 Chief Complaint Chief Complaint  Patient presents with  . Right Hip - Post Operative Visit, Pain, Fracture, Follow-up   Date of surgery: 06/17/2024  Procedure Right hip hemiarthroplasty  Reason for Visit The patient is a 73 year old female that presented for postop visit for her right hip.  She has status post right hip hemiarthroplasty for a right hip fracture that occurred back on June 17, 2024.  The patient is here today for staple removal and x-rays of the right hip.  The patient has been doing physical therapy several times a week.  She has no increased swelling or pain.  She is becoming more comfortable with the right hip.  The patient is not a diabetic.   Medications Current Outpatient Medications  Medication Sig Dispense Refill  . buPROPion  (WELLBUTRIN  XL) 150 MG XL tablet Take 1 tablet (150 mg total) by mouth once daily 90 tablet 1  . calcium carbonate (CALCIUM 600 ORAL) Take 2 capsules by mouth once daily    . carbidopa -levodopa  (SINEMET ) 25-100 mg tablet Take 1.5 tablets by mouth 3 (three) times daily 405 tablet 3  . cyanocobalamin  (VITAMIN B12) 1,000 mcg/mL injection Inject into the muscle monthly    . cyanocobalamin  (VITAMIN B12) 1000 MCG tablet Take 1,000 mcg by mouth once daily    . cyanocobalamin , vitamin B-12, (VITAMIN B-12 ORAL) Take 1 tablet by mouth every morning before breakfast    . DULoxetine  (CYMBALTA ) 30 MG DR capsule Take 1 capsule (30 mg total) by mouth 2 (two) times daily 180 capsule 3  . ergocalciferol , vitamin D2, 1,250 mcg (50,000 unit) capsule Take 1 capsule (50,000 Units total) by mouth once a week for 84 days 4 capsule 2  . fluticasone  propionate (FLONASE ) 50 mcg/actuation nasal spray Place 2 sprays into one nostril once daily    . meclizine  (ANTIVERT ) 25 mg tablet Take 25 mg by mouth 3 (three) times daily as needed for Dizziness    . multivitamin capsule Take 1 capsule by mouth every other day    . olive oil external oil Take 1 capsule by mouth  once daily    . olive oil Oil Take 1 capsule by mouth once daily    . polyethylene glycol (MIRALAX ) packet Take 17 g by mouth once daily    . thiamine  (VITAMIN B-1) 100 MG tablet Take 100 mg by mouth once daily    . folic acid  (FOLVITE ) 1 MG tablet Take 1 tablet (1 mg total) by mouth once daily for 180 days 90 tablet 1  . gabapentin  (NEURONTIN ) 100 MG capsule Take 1 capsule (100 mg total) by mouth 3 (three) times daily for 180 days 270 capsule 3   No current facility-administered medications for this visit.    Allergies Patient has no known allergies.  Histories Past Medical History: Past Medical History:  Diagnosis Date  . Depression   . PD (Parkinson's disease) (CMS/HHS-HCC)   . Vertigo     Past Surgical History: Past Surgical History:  Procedure Laterality Date  . CESAREAN DELIVERY      Social History: Social History   Socioeconomic History  . Marital status: Widowed  Tobacco Use  . Smoking status: Never  . Smokeless tobacco: Never  Vaping Use  . Vaping status: Never Used  Substance and Sexual Activity  . Alcohol  use: Not Currently  . Drug use: Not Currently  . Sexual activity: Defer   Social Drivers of Health   Financial Resource Strain: Low Risk  (06/12/2024)   Overall Physicist, Medical Strain (  CARDIA)   . Difficulty of Paying Living Expenses: Not hard at all  Food Insecurity: No Food Insecurity (06/17/2024)   Received [facility]   Hunger Vital Sign   . Within the past 12 months, you worried that your food would run out before you got the money to buy more.: Never true   . Within the past 12 months, the food you bought just didn't last and you didn't have money to get more.: Never true  Transportation Needs: No Transportation Needs (06/17/2024)   [facility] - Transportation   . In the past 12 months, has lack of transportation kept you from medical appointments or from getting medications?: No   . In the past 12 months, has  lack of transportation kept you from meetings, work, or from getting things needed for daily living?: No  Social Connections: Moderately Isolated (06/17/2024)   Received from [facility]   Social Connection and Isolation Panel   . In a typical week, how many times do you talk on the phone with family, friends, or neighbors?: Three times a week   . How often do you get together with friends or relatives?: Three times a week   . How often do you attend church or religious services?: 1 to 4 times per year   . Do you belong to any clubs or organizations such as church groups, unions, fraternal or athletic groups, or school groups?: No   . How often do you attend meetings of the clubs or organizations you belong to?: Never   . Are you married, widowed, divorced, separated, never married, or living with a partner?: Never married  Housing Stability: Low Risk  (06/12/2024)   Housing Stability Vital Sign   . Unable to Pay for Housing in the Last Year: No   . Number of Times Moved in the Last Year: 0   . Homeless in the Last Year: No    Family History: Family History  Problem Relation Name Age of Onset  . Osteoarthritis Mother    . Alzheimer's disease Mother    . No Known Problems Father      Review of Systems A comprehensive 14 point ROS was performed, reviewed, and the pertinent orthopaedic findings are documented in the HPI.  Exam BP 110/78   Ht 162.6 cm (5' 4)   Wt 64.4 kg (142 lb)   BMI 24.37 kg/m    General/Constitutional: NAD, conversant Eyes: Pupils equal and round, extraocular movements intact ENT: atraumatic external nose and ears, moist mucous membranes Respiratory: non-labored breathing, symmetric chest rise, chest sounds clear. Cardiovascular: no visible lower extremity edema, peripheral pulses present, regular rate and rhythm  Skin: normal skin turgor, warm and dry Neurological: cranial nerves grossly intact, sensation grossly intact Psychological:  Appropriate mood and  affect; appropriate judgment Musculoskeletal: as detailed below:  General: Well developed, well nourished 73 y.o. female in no apparent distress.  Normal affect.  Normal communication.  Patient answers questions appropriately.  No gait noted with assistance with transfers to the exam table.    Right lower Extremity: Examination of the right hip reveals the incision to be intact.  The existing dressing was removed.  The staples were removed with no complications.  The incision was cleaned with Betadine.  Steri Strips were applied, as well as a new dressing.  There is no erythema, and no drainage.  There is no purulence.  The range of motion was limited with testing. The ankle  and knee had full range of motion with no pain.  There was less than 2 second capillary refill. The patient had a negative straight leg raise.  The sensation was intact to light touch.  The patient has a negative Homan's test.  There is good skin warmth.   Radiology: Xrays of the right hip were ordered and interpreted 07/07/2024, with 3 views using AP pelvis, AP and lateral views.  Xrays revealed the right hip hemiarthroplasty components are intact with no loosening or backing out of the implant.  The staples are noted on x-ray.    Impression Encounter Diagnoses  Name Primary?  . Status post hemiarthroplasty of right hip Yes  . Right hip pain     Plan   1.  I discussed the physical exam finding as well as the surgical procedure. 2.  The patient will do physical therapy and will continue doing this at least 3 times a week working on strength and range of motion activities. 3.  She will use good wound care. 4.  The patient will be discharged home with home health physical therapy when she is safe and independent. 5.  The patient will follow-up in 4 weeks with Dr. Edie for x-rays of the right hip.  This note was generated in part with voice recognition software and I apologize for any typographical errors that were not  detected and corrected.  Review of the [place]  CSRS was performed in accordance with the [place] MVE prior to dispensing any controlled substances.   Dorn Krystal Doyne PA-C, [email or web address]

## 2024-07-08 DIAGNOSIS — G20A1 Parkinson's disease without dyskinesia, without mention of fluctuations: Secondary | ICD-10-CM | POA: Diagnosis not present

## 2024-07-08 DIAGNOSIS — S72001D Fracture of unspecified part of neck of right femur, subsequent encounter for closed fracture with routine healing: Secondary | ICD-10-CM | POA: Diagnosis not present

## 2024-07-11 DIAGNOSIS — S63301D Traumatic rupture of unspecified ligament of right wrist, subsequent encounter: Secondary | ICD-10-CM | POA: Diagnosis not present

## 2024-07-11 DIAGNOSIS — S72001D Fracture of unspecified part of neck of right femur, subsequent encounter for closed fracture with routine healing: Secondary | ICD-10-CM | POA: Diagnosis not present

## 2024-07-11 DIAGNOSIS — F331 Major depressive disorder, recurrent, moderate: Secondary | ICD-10-CM | POA: Diagnosis not present

## 2024-07-18 DIAGNOSIS — S63301D Traumatic rupture of unspecified ligament of right wrist, subsequent encounter: Secondary | ICD-10-CM | POA: Diagnosis not present

## 2024-07-18 DIAGNOSIS — S72001D Fracture of unspecified part of neck of right femur, subsequent encounter for closed fracture with routine healing: Secondary | ICD-10-CM | POA: Diagnosis not present

## 2024-07-18 DIAGNOSIS — G20A1 Parkinson's disease without dyskinesia, without mention of fluctuations: Secondary | ICD-10-CM | POA: Diagnosis not present

## 2024-07-18 DIAGNOSIS — F331 Major depressive disorder, recurrent, moderate: Secondary | ICD-10-CM | POA: Diagnosis not present

## 2024-07-21 DIAGNOSIS — D509 Iron deficiency anemia, unspecified: Secondary | ICD-10-CM | POA: Diagnosis not present

## 2024-07-21 DIAGNOSIS — Z79899 Other long term (current) drug therapy: Secondary | ICD-10-CM | POA: Diagnosis not present

## 2024-07-21 DIAGNOSIS — G20A1 Parkinson's disease without dyskinesia, without mention of fluctuations: Secondary | ICD-10-CM | POA: Diagnosis not present

## 2024-07-21 DIAGNOSIS — F32A Depression, unspecified: Secondary | ICD-10-CM | POA: Diagnosis not present

## 2024-07-21 DIAGNOSIS — E559 Vitamin D deficiency, unspecified: Secondary | ICD-10-CM | POA: Diagnosis not present

## 2024-07-21 DIAGNOSIS — Z9181 History of falling: Secondary | ICD-10-CM | POA: Diagnosis not present

## 2024-07-21 DIAGNOSIS — I82409 Acute embolism and thrombosis of unspecified deep veins of unspecified lower extremity: Secondary | ICD-10-CM | POA: Diagnosis not present

## 2024-07-21 DIAGNOSIS — K5900 Constipation, unspecified: Secondary | ICD-10-CM | POA: Diagnosis not present

## 2024-07-21 DIAGNOSIS — S72001D Fracture of unspecified part of neck of right femur, subsequent encounter for closed fracture with routine healing: Secondary | ICD-10-CM | POA: Diagnosis not present

## 2024-07-21 DIAGNOSIS — Z96641 Presence of right artificial hip joint: Secondary | ICD-10-CM | POA: Diagnosis not present

## 2024-07-29 DIAGNOSIS — K5900 Constipation, unspecified: Secondary | ICD-10-CM | POA: Diagnosis not present

## 2024-07-29 DIAGNOSIS — E559 Vitamin D deficiency, unspecified: Secondary | ICD-10-CM | POA: Diagnosis not present

## 2024-07-29 DIAGNOSIS — S72001D Fracture of unspecified part of neck of right femur, subsequent encounter for closed fracture with routine healing: Secondary | ICD-10-CM | POA: Diagnosis not present

## 2024-07-29 DIAGNOSIS — G20A1 Parkinson's disease without dyskinesia, without mention of fluctuations: Secondary | ICD-10-CM | POA: Diagnosis not present

## 2024-07-29 DIAGNOSIS — D509 Iron deficiency anemia, unspecified: Secondary | ICD-10-CM | POA: Diagnosis not present

## 2024-07-29 DIAGNOSIS — I82409 Acute embolism and thrombosis of unspecified deep veins of unspecified lower extremity: Secondary | ICD-10-CM | POA: Diagnosis not present

## 2024-08-02 ENCOUNTER — Emergency Department

## 2024-08-02 ENCOUNTER — Emergency Department
Admission: EM | Admit: 2024-08-02 | Discharge: 2024-08-02 | Disposition: A | Discharge status: Home / Self Care | Attending: Emergency Medicine | Admitting: Emergency Medicine

## 2024-08-02 ENCOUNTER — Other Ambulatory Visit: Payer: Self-pay

## 2024-08-02 DIAGNOSIS — Z471 Aftercare following joint replacement surgery: Secondary | ICD-10-CM | POA: Diagnosis not present

## 2024-08-02 DIAGNOSIS — Z96641 Presence of right artificial hip joint: Secondary | ICD-10-CM | POA: Diagnosis not present

## 2024-08-02 DIAGNOSIS — R609 Edema, unspecified: Secondary | ICD-10-CM | POA: Insufficient documentation

## 2024-08-02 DIAGNOSIS — R9431 Abnormal electrocardiogram [ECG] [EKG]: Secondary | ICD-10-CM | POA: Diagnosis not present

## 2024-08-02 DIAGNOSIS — M47816 Spondylosis without myelopathy or radiculopathy, lumbar region: Secondary | ICD-10-CM | POA: Diagnosis not present

## 2024-08-02 DIAGNOSIS — R600 Localized edema: Secondary | ICD-10-CM | POA: Diagnosis not present

## 2024-08-02 DIAGNOSIS — M25551 Pain in right hip: Secondary | ICD-10-CM | POA: Insufficient documentation

## 2024-08-02 DIAGNOSIS — M7989 Other specified soft tissue disorders: Secondary | ICD-10-CM | POA: Diagnosis not present

## 2024-08-02 DIAGNOSIS — W06XXXA Fall from bed, initial encounter: Secondary | ICD-10-CM | POA: Insufficient documentation

## 2024-08-02 LAB — CBC WITH DIFFERENTIAL/PLATELET
Abs Immature Granulocytes: 0.01 K/uL (ref 0.00–0.07)
Basophils Absolute: 0 K/uL (ref 0.0–0.1)
Basophils Relative: 1 %
Eosinophils Absolute: 0.1 K/uL (ref 0.0–0.5)
Eosinophils Relative: 1 %
HCT: 29.6 % — ABNORMAL LOW (ref 36.0–46.0)
Hemoglobin: 10 g/dL — ABNORMAL LOW (ref 12.0–15.0)
Immature Granulocytes: 0 %
Lymphocytes Relative: 19 %
Lymphs Abs: 0.9 K/uL (ref 0.7–4.0)
MCH: 31.4 pg (ref 26.0–34.0)
MCHC: 33.8 g/dL (ref 30.0–36.0)
MCV: 93.1 fL (ref 80.0–100.0)
Monocytes Absolute: 0.6 K/uL (ref 0.1–1.0)
Monocytes Relative: 13 %
Neutro Abs: 2.9 K/uL (ref 1.7–7.7)
Neutrophils Relative %: 66 %
Platelets: 162 K/uL (ref 150–400)
RBC: 3.18 MIL/uL — ABNORMAL LOW (ref 3.87–5.11)
RDW: 13.3 % (ref 11.5–15.5)
WBC: 4.4 K/uL (ref 4.0–10.5)
nRBC: 0 % (ref 0.0–0.2)

## 2024-08-02 LAB — COMPREHENSIVE METABOLIC PANEL WITH GFR
ALT: 16 U/L (ref 0–44)
AST: 89 U/L — ABNORMAL HIGH (ref 15–41)
Albumin: 4.1 g/dL (ref 3.5–5.0)
Alkaline Phosphatase: 140 U/L — ABNORMAL HIGH (ref 38–126)
Anion gap: 11 (ref 5–15)
BUN: 35 mg/dL — ABNORMAL HIGH (ref 8–23)
CO2: 27 mmol/L (ref 22–32)
Calcium: 9.7 mg/dL (ref 8.9–10.3)
Chloride: 101 mmol/L (ref 98–111)
Creatinine, Ser: 0.86 mg/dL (ref 0.44–1.00)
GFR, Estimated: >60 mL/min (ref >60)
Glucose, Bld: 85 mg/dL (ref 70–99)
Potassium: 3.5 mmol/L (ref 3.5–5.1)
Sodium: 140 mmol/L (ref 135–145)
Total Bilirubin: 0.6 mg/dL (ref 0.0–1.2)
Total Protein: 7.3 g/dL (ref 6.5–8.1)

## 2024-08-02 LAB — PRO BRAIN NATRIURETIC PEPTIDE: Pro Brain Natriuretic Peptide: 139 pg/mL (ref <300.0)

## 2024-08-02 LAB — TROPONIN T, HIGH SENSITIVITY
Troponin T High Sensitivity: 26 ng/L — ABNORMAL HIGH (ref 0–19)
Troponin T High Sensitivity: 27 ng/L — ABNORMAL HIGH (ref 0–19)

## 2024-08-02 NOTE — ED Provider Notes (Signed)
 [facility] Provider Note    Event Date/Time   First MD Initiated Contact with Patient 08/02/24 0725     (approximate)   History   Fall (Right hip pain)   HPI  Michele Brewer is a 73 y.o. female who presents to the emergency department today with primary concerns for right hip pain.  Patient states that hip pain actually started yesterday.  She has history of hip replacement performed little over a month ago.  She states starting yesterday having pain in that hip.  She then had a fall this morning as she was trying to sit back in bed.  She tells me that the pain in the hip is not worse after the fall.  She denied any falls yesterday prior to the pain starting.  In addition she has noticed swelling in her legs.  She has had swelling before but never to this extent and it has been worse over the past 2 to 2-1/2 days.     Physical Exam   Triage Vital Signs: ED Triage Vitals  Encounter Vitals Group     BP 08/02/24 0722 123/65     Girls Systolic BP Percentile --      Girls Diastolic BP Percentile --      Boys Systolic BP Percentile --      Boys Diastolic BP Percentile --      Pulse Rate 08/02/24 0722 96     Resp 08/02/24 0722 19     Temp 08/02/24 0726 97.6 F (36.4 C)     Temp Source 08/02/24 0722 Oral     SpO2 08/02/24 0722 100 %     Weight --      Height 08/02/24 0717 5' 4 (1.626 m)     Head Circumference --      Peak Flow --      Pain Score 08/02/24 0716 0     Pain Loc --      Pain Education --      Exclude from Growth Chart --     Most recent vital signs: Vitals:   08/02/24 0722 08/02/24 0726  BP: 123/65   Pulse: 96   Resp: 19   Temp:  97.6 F (36.4 C)  SpO2: 100%    General: Awake, alert, oriented. CV:  Good peripheral perfusion. Regular rate and rhythm. Resp:  Normal effort. Lungs clear. Abd:  No distention.   ED Results / Procedures / Treatments   Labs (all labs ordered are listed, but only abnormal results are  displayed) Labs Reviewed  COMPREHENSIVE METABOLIC PANEL WITH GFR - Abnormal; Notable for the following components:      Result Value   BUN 35 (*)    AST 89 (*)    Alkaline Phosphatase 140 (*)    All other components within normal limits  CBC WITH DIFFERENTIAL/PLATELET - Abnormal; Notable for the following components:   RBC 3.18 (*)    Hemoglobin 10.0 (*)    HCT 29.6 (*)    All other components within normal limits  TROPONIN T, HIGH SENSITIVITY - Abnormal; Notable for the following components:   Troponin T High Sensitivity 26 (*)    All other components within normal limits  TROPONIN T, HIGH SENSITIVITY - Abnormal; Notable for the following components:   Troponin T High Sensitivity 27 (*)    All other components within normal limits  PRO BRAIN NATRIURETIC PEPTIDE  CBC WITH DIFFERENTIAL/PLATELET     EKG  I, Guadalupe Eagles, attending physician,  personally viewed and interpreted this EKG  EKG Time: 0734 Rate: 78 Rhythm: sinus rhythm Axis: normal Intervals: qtc 431 QRS: narrow, q waves v5, v6 ST changes: no st elevation Impression: abnormal ekg    RADIOLOGY I independently interpreted and visualized the right hip. My interpretation: No fracture Radiology interpretation:  IMPRESSION:  Status post right hip replacement without evidence for  periprosthetic fracture or dislocation.   I independently interpreted and visualized the US  lower extremities. My interpretation: No clot Radiology interpretation:  IMPRESSION:  1. No evidence of deep venous thrombosis bilaterally.  2. Small amount of fluid in the popliteal fossa on the left.      PROCEDURES:  Critical Care performed: No    MEDICATIONS ORDERED IN ED: Medications - No data to display   IMPRESSION / MDM / ASSESSMENT AND PLAN / ED COURSE  I reviewed the triage vital signs and the nursing notes.                              Differential diagnosis includes, but is not limited to, heart failure, kidney  failure, blood clot, hip fracture/dislocation  Patient's presentation is most consistent with acute presentation with potential threat to life or bodily function.   Patient presented to the emergency department today because of concerns for right hip pain as well as lower extremity edema.  X-ray of the hip does not show any fracture or dislocation.  Workup for causes of edema is reassuring here.  No evidence of heart failure, kidney failure.  Troponin was minimally elevated however was stable on recheck.  I have low concern for ACS at this time.  Ultrasounds of the lower extremity without any DVT.  I do think would be reasonable for patient be discharged at this time.     FINAL CLINICAL IMPRESSION(S) / ED DIAGNOSES   Final diagnoses:  Edema, unspecified type  Right hip pain    Note:  This document was prepared using Dragon voice recognition software and may include unintentional dictation errors.    Floy Roberts, MD 08/02/24 [phone]

## 2024-08-02 NOTE — ED Triage Notes (Addendum)
 Pt in via [facility] from home. Around Oct. 1st pt had right hip surgery. This morning pt fell on same hip. Per pt she was trying to sit on the side of bed a missed due to light being off. Pt also has swelling in both feet. 6/10 pain upon standing. No pain at rest. Pt does have +2 pitting edema bilaterally. Pt states she has had swelling in her legs for 4 days. Swelling is new. Pt denies hitting head. Denies blood thinners.  132/67

## 2024-08-02 NOTE — ED Notes (Signed)
 Purple top collected and sent to lab.

## 2024-08-02 NOTE — ED Notes (Signed)
 Lab called due to need for recollect on Lavender top.

## 2024-08-02 NOTE — ED Notes (Signed)
Pt given phone to call for ride.

## 2024-08-12 DIAGNOSIS — R600 Localized edema: Secondary | ICD-10-CM | POA: Diagnosis not present

## 2024-08-12 DIAGNOSIS — Z9889 Other specified postprocedural states: Secondary | ICD-10-CM | POA: Diagnosis not present

## 2024-08-12 DIAGNOSIS — G20A1 Parkinson's disease without dyskinesia, without mention of fluctuations: Secondary | ICD-10-CM | POA: Diagnosis not present

## 2024-08-12 DIAGNOSIS — F3340 Major depressive disorder, recurrent, in remission, unspecified: Secondary | ICD-10-CM | POA: Diagnosis not present

## 2024-08-12 DIAGNOSIS — Z09 Encounter for follow-up examination after completed treatment for conditions other than malignant neoplasm: Secondary | ICD-10-CM | POA: Diagnosis not present

## 2024-08-12 NOTE — Progress Notes (Signed)
 Chief Complaint  Patient presents with  . Rehab follow up    Closed right hip fracture (HCC)  Fracture, proximal femur, right, closed, initial encounter (HCC)  Rupture of ligament of right wrist  Parkinson's disease with dyskinesia, unspecified whether manifestations fluctuate (HCC)      Patient is agreeable to [employer] scribe.   History of Present Illness Michele Brewer is a 73 year old female with Parkinson's disease and depression who presents for a rehab and hospital follow-up after a hip fracture surgery.  She was admitted on June 17, 2024, and discharged on June 23, 2024, following a fall on her right side that resulted in an acute fracture of the right proximal femur. She underwent hip surgery during her hospital stay. Post-surgery, she was placed in a nursing facility and discharged from there on July 19, 2024. She has been receiving occupational physical therapy at home since then.  She experiences difficulty with certain activities, such as crossing her legs and putting on pants, despite being shown how to use a reacher. She is concerned about her ability to drive, noting that she only drives short distances for errands and feels her reaction time is adequate. She has not regained full range of motion in her hip yet.  She reports a possible sprain of the right wrist, for which she was placed in a brace during her hospital stay. She often forgets to put the brace back on after washing her hands, which affects her wrist by the end of the day.  She has experienced swelling in both feet, which started before her discharge from the nursing facility. An ultrasound performed on July 02, 2024, was negative for blood clots. The swelling is worse in the morning and improves with movement throughout the day. No changes in salt intake or hydration and no shortness of breath or chest pain are reported.  She is currently taking gabapentin  and reports no changes in her medication  regimen. She has not been taking any new medications that could account for the swelling.    ROS  Review of systems is unremarkable for any active cardiac, respiratory, GI, GU, hematologic, neurologic, dermatologic, HEENT, or psychiatric symptoms except as noted above.  No fevers, chills, or constitutional symptoms.   Current Outpatient Medications  Medication Sig Dispense Refill  . buPROPion  (WELLBUTRIN  XL) 150 MG XL tablet Take 1 tablet (150 mg total) by mouth once daily 90 tablet 1  . carbidopa -levodopa  (SINEMET ) 25-100 mg tablet Take 1.5 tablets by mouth 3 (three) times daily 405 tablet 3  . DULoxetine  (CYMBALTA ) 30 MG DR capsule Take 1 capsule (30 mg total) by mouth 2 (two) times daily 180 capsule 3  . ergocalciferol , vitamin D2, 1,250 mcg (50,000 unit) capsule Take 1 capsule (50,000 Units total) by mouth once a week for 84 days 4 capsule 2  . fluticasone  propionate (FLONASE ) 50 mcg/actuation nasal spray Place 2 sprays into one nostril once daily    . folic acid  (FOLVITE ) 1 MG tablet Take 1 tablet (1 mg total) by mouth once daily for 180 days 90 tablet 1  . gabapentin  (NEURONTIN ) 100 MG capsule Take 1 capsule (100 mg total) by mouth 3 (three) times daily for 180 days 270 capsule 3  . HYDROcodone -acetaminophen  (NORCO) 5-325 mg tablet Take 1-2 tablets by mouth Take 1-2 tablets by mouth every 6 (six) hours as needed for moderate pain (pain score 4-6) or severe pain (pain score 7-10).    . meclizine  (ANTIVERT ) 25 mg tablet Take 25  mg by mouth 3 (three) times daily as needed for Dizziness    . calcium carbonate (CALCIUM 600 ORAL) Take 2 capsules by mouth once daily (Patient not taking: Reported on 08/12/2024)    . cyanocobalamin  (VITAMIN B12) 1,000 mcg/mL injection Inject into the muscle monthly (Patient not taking: Reported on 08/12/2024)    . cyanocobalamin  (VITAMIN B12) 1000 MCG tablet Take 1,000 mcg by mouth once daily (Patient not taking: Reported on 08/12/2024)    . cyanocobalamin , vitamin  B-12, (VITAMIN B-12 ORAL) Take 1 tablet by mouth every morning before breakfast (Patient not taking: Reported on 08/12/2024)    . multivitamin capsule Take 1 capsule by mouth every other day (Patient not taking: Reported on 08/12/2024)    . olive oil external oil Take 1 capsule by mouth once daily (Patient not taking: Reported on 08/12/2024)    . olive oil Oil Take 1 capsule by mouth once daily (Patient not taking: Reported on 08/12/2024)    . polyethylene glycol (MIRALAX ) packet Take 17 g by mouth once daily (Patient not taking: Reported on 08/12/2024)    . thiamine  (VITAMIN B-1) 100 MG tablet Take 100 mg by mouth once daily (Patient not taking: Reported on 08/12/2024)     No current facility-administered medications for this visit.    Allergies as of 08/12/2024  . (No Known Allergies)    Patient Active Problem List  Diagnosis  . PD (Parkinson's disease) (CMS/HHS-HCC)  . Parkinson's disease (CMS/HHS-HCC)  . Major depressive disorder, recurrent, in remission  . Osteopenia  . Frequent falls  . Fall  . Ambulatory dysfunction  . Vitamin B12 deficiency  . Folate deficiency  . Loss of memory  . Hypoalbuminemia  . Low vitamin B12 level  . Closed displaced midcervical fracture of right femur (CMS/HHS-HCC)  . Subluxation of distal radioulnar joint of right wrist  . Status post hemiarthroplasty of right hip    Past Medical History:  Diagnosis Date  . Depression   . PD (Parkinson's disease) (CMS/HHS-HCC)   . Vertigo     Past Surgical History:  Procedure Laterality Date  . CESAREAN DELIVERY      Vitals:   08/12/24 1154  BP: 92/50  Pulse: 59  SpO2: 99%  Weight: 66.2 kg (146 lb)  Height: 162.6 cm (5' 4)  PainSc: 0-No pain   Body mass index is 25.06 kg/m.  Exam BP 92/50 (BP Location: Left upper arm, Patient Position: Sitting, BP Cuff Size: Adult)   Pulse 59   Ht 162.6 cm (5' 4)   Wt 66.2 kg (146 lb)   SpO2 99%   BMI 25.06 kg/m   General. Well appearing; NAD; VS  reviewed     Eyes. Sclera and conjunctiva clear; Vision grossly intact; extraocular movements intact Oropharynx. No suspicious lesions Neck. Supple. No swelling, masses, thyroid  normal size, no masses palpated.   Lungs. Respirations unlabored; clear to auscultation bilaterally Cardiovascular. Heart regular rate and rhythm without murmurs, gallops, or rubs Extremities: without edema and with 2+ pulses bilaterally Skin. Normal color and turgor Neurologic. Alert and oriented x3; CN 2-12 grossly intact; no focal deficits  Assessment & Plan  Right proximal femur fracture, post-surgical Status post right proximal femur fracture and hip surgery. Range of motion not fully restored. Awaiting orthopedic follow-up for activity clearance. - Continue physical therapy at home. - Follow up with orthopedic surgeon for activity clearance.  Possible right wrist sprain, post-injury Possible right wrist sprain with inconsistent brace use. Healing expected in 3-4 months. - Encouraged consistent  use of wrist brace.  Localized edema of bilateral lower extremities Bilateral lower extremity edema, worse in the morning, improving with movement. Negative ultrasound for blood clots. No signs of heart failure or medication-induced edema. Likely due to sedentary lifestyle. Blood pressure low, making diuretics risky. - Monitor blood pressure at home for two days. - Consider compression stockings if feasible. - Follow up with orthopedic surgeon for blood pressure readings. - Consider short course of diuretics if blood pressure is stable.   Have reviewed all hospital notes, labs, imaging.  F/U: Patient to follow-up as needed.  JASON HESTLE WHITAKER, PA  This note has been created using automated tools and reviewed for accuracy by JASON HESTLE WHITAKER.   Note: This dictation was prepared with Dragon dictation along with smaller phrase technology. Any transcriptional errors that result from this process are  unintentional.

## 2024-08-13 DIAGNOSIS — Z96641 Presence of right artificial hip joint: Secondary | ICD-10-CM | POA: Diagnosis not present

## 2024-08-13 NOTE — Progress Notes (Signed)
 HPI:  Michele Brewer is a 73 y.o. female who presents for follow-up now 2 months status post a right hip hemiarthroplasty for a displaced right femoral neck fracture.  Overall, the patient feels that she is doing well.  She denies any pain in the right hip on today's visit but does note occasional mild discomfort for which she will take Tylenol  as necessary with relief.  She continues to receive home physical therapy which she finds to be quite beneficial.  She continues to ambulate with a walker for balance and support, and is not able to reciprocate stairs.  However, she has resumed most of her normal daily activities and is sleeping well at night.  She denies any reinjury to the hip, and denies any fevers or chills.  The patient also returns for follow-up of an apparent right distal radial ulnar joint subluxation which has been managed with a Velcro wrist immobilizer.  The patient notes that she continues to have intermittent pain in her wrist, especially with weightbearing activities, but finds that the wrist immobilizer does help alleviate her symptoms.  She is able to perform most of her normal daily activities without difficulty.  She denies any reinjury to the wrist, and denies any numbness or paresthesias to her hand.  Current Outpatient Medications  Medication Sig Dispense Refill  . buPROPion  (WELLBUTRIN  XL) 150 MG XL tablet Take 1 tablet (150 mg total) by mouth once daily 90 tablet 1  . calcium carbonate (CALCIUM 600 ORAL) Take 2 capsules by mouth once daily (Patient not taking: Reported on 08/12/2024)    . carbidopa -levodopa  (SINEMET ) 25-100 mg tablet Take 1.5 tablets by mouth 3 (three) times daily 405 tablet 3  . cyanocobalamin  (VITAMIN B12) 1,000 mcg/mL injection Inject into the muscle monthly (Patient not taking: Reported on 08/12/2024)    . cyanocobalamin  (VITAMIN B12) 1000 MCG tablet Take 1,000 mcg by mouth once daily (Patient not taking: Reported on 08/12/2024)    . cyanocobalamin ,  vitamin B-12, (VITAMIN B-12 ORAL) Take 1 tablet by mouth every morning before breakfast (Patient not taking: Reported on 08/12/2024)    . DULoxetine  (CYMBALTA ) 30 MG DR capsule Take 1 capsule (30 mg total) by mouth 2 (two) times daily 180 capsule 3  . ergocalciferol , vitamin D2, 1,250 mcg (50,000 unit) capsule Take 1 capsule (50,000 Units total) by mouth once a week for 84 days 4 capsule 2  . fluticasone  propionate (FLONASE ) 50 mcg/actuation nasal spray Place 2 sprays into one nostril once daily    . folic acid  (FOLVITE ) 1 MG tablet Take 1 tablet (1 mg total) by mouth once daily for 180 days 90 tablet 1  . gabapentin  (NEURONTIN ) 100 MG capsule Take 1 capsule (100 mg total) by mouth 3 (three) times daily for 180 days 270 capsule 3  . HYDROcodone -acetaminophen  (NORCO) 5-325 mg tablet Take 1-2 tablets by mouth Take 1-2 tablets by mouth every 6 (six) hours as needed for moderate pain (pain score 4-6) or severe pain (pain score 7-10).    . meclizine  (ANTIVERT ) 25 mg tablet Take 25 mg by mouth 3 (three) times daily as needed for Dizziness    . multivitamin capsule Take 1 capsule by mouth every other day (Patient not taking: Reported on 08/12/2024)    . olive oil external oil Take 1 capsule by mouth once daily (Patient not taking: Reported on 08/12/2024)    . olive oil Oil Take 1 capsule by mouth once daily (Patient not taking: Reported on 08/12/2024)    . polyethylene  glycol (MIRALAX ) packet Take 17 g by mouth once daily (Patient not taking: Reported on 08/12/2024)    . thiamine  (VITAMIN B-1) 100 MG tablet Take 100 mg by mouth once daily (Patient not taking: Reported on 08/12/2024)     No current facility-administered medications for this visit.   No Known Allergies Past Medical History:  Diagnosis Date  . Depression   . PD (Parkinson's disease) (CMS/HHS-HCC)   . Vertigo    Past Surgical History:  Procedure Laterality Date  . CESAREAN DELIVERY      Family History  Problem Relation Name Age of  Onset  . Osteoarthritis Mother    . Alzheimer's disease Mother    . No Known Problems Father      Social History   Socioeconomic History  . Marital status: Widowed  Tobacco Use  . Smoking status: Never  . Smokeless tobacco: Never  Vaping Use  . Vaping status: Never Used  Substance and Sexual Activity  . Alcohol  use: Not Currently  . Drug use: Not Currently  . Sexual activity: Defer   Social Drivers of Health   Financial Resource Strain: Low Risk  (06/12/2024)   Overall Financial Resource Strain (CARDIA)   . Difficulty of Paying Living Expenses: Not hard at all  Food Insecurity: No Food Insecurity (06/17/2024)   Received from [facility]   Hunger Vital Sign   . Within the past 12 months, you worried that your food would run out before you got the money to buy more.: Never true   . Within the past 12 months, the food you bought just didn't last and you didn't have money to get more.: Never true  Transportation Needs: No Transportation Needs (06/17/2024)   Received from [facility] - Transportation   . In the past 12 months, has lack of transportation kept you from medical appointments or from getting medications?: No   . In the past 12 months, has lack of transportation kept you from meetings, work, or from getting things needed for daily living?: No    Review of Systems:  A comprehensive 14 point ROS was performed, reviewed, and the pertinent orthopaedic findings are documented in the HPI.  Physical Exam: Vitals:   08/13/24 1123  Weight: 66.2 kg (146 lb)  Height: 162.6 cm (5' 4)  PainSc: 0-No pain  PainLoc: Hip   General/Constitutional: The patient appears to be well-nourished, well-developed, and in no acute distress. Neuro/Psych: Normal mood and affect, oriented to person, place and time.  Right hip exam: The patient ambulates with a slow somewhat shuffling but nonantalgic gait, and uses a rolling walker for balance and support.  Skin inspection of the  right hip demonstrates her surgical incision to be well-healed and without evidence for infection.  No swelling, erythema, ecchymosis, abrasions, or other skin abnormalities are identified.  She has no tenderness to palpation over the anterior or lateral aspects of the right hip.  She is able to arise from a seated position with some difficulty due to her generalized weakness and poor balance.  In stance, her pelvis is level.  She can heel raise and toe raise appropriately and is able to march in place without evidence of hip abductor weakness.  She is grossly neurovascularly intact to the right lower extremity and foot, other than bilateral lower extremity edema.  Right wrist exam: Skin inspection of the right wrist is unremarkable.  No swelling, erythema, ecchymosis, abrasions, or other skin abnormalities are identified.  She has minimal  tenderness to palpation over the dorsal aspect of the wrist, especially over the distal radial ulnar joint.  She does exhibit near full active and passive flexion and extension of the wrist, but does have some discomfort with pronation more than supination in the area of the distal radioulnar joint.  She is able to actively flex extend all digits fully without any pain or triggering.  She is neurovascularly intact all digits.  X-rays/MRI/Lab data:  X-rays of the pelvis and right hip are obtained.  These films demonstrate excellent position of the femoral component which is without evidence of loosening.  The femoral head is concentrically located within the acetabulum.  No new acute bony processes are identified.  Assessment: Encounter Diagnoses  Name Primary?  . Status post hemiarthroplasty of right hip Yes  . Closed displaced midcervical fracture of right femur, sequela   . Subluxation of distal radioulnar joint of right wrist, sequela     Plan: The treatment options were discussed with the patient.  In addition, patient educational materials were provided  regarding the diagnosis and treatment options.  Regarding her right hip, overall, the patient is quite pleased with her symptomatic and functional improvement at this time.  I have recommended that she continue with physical therapy and her home exercises in order to optimize her range of motion, strength, and overall function.  She may progress in her activities as symptoms permit, but is to avoid offending activities.   Regarding her right wrist, the patient is offered but declines referral to a hand expert for further discussion as to possible treatment options for her right wrist symptoms.  Therefore, she is advised to continue to wear her wrist immobilizer as needed for comfort, but otherwise to continue with her normal daily activities.  She may take over-the-counter medications as needed for discomfort.  All of the patient's questions and concerns were answered.  She can call any time with further concerns.  She will follow up with me on an as necessary basis.

## 2024-08-18 ENCOUNTER — Emergency Department

## 2024-08-18 ENCOUNTER — Other Ambulatory Visit: Payer: Self-pay

## 2024-08-18 ENCOUNTER — Observation Stay
Admission: EM | Admit: 2024-08-18 | Discharge: 2024-08-25 | DRG: 312 | Disposition: A | Attending: Emergency Medicine | Admitting: Emergency Medicine

## 2024-08-18 ENCOUNTER — Encounter: Payer: Self-pay | Admitting: Emergency Medicine

## 2024-08-18 DIAGNOSIS — M47812 Spondylosis without myelopathy or radiculopathy, cervical region: Secondary | ICD-10-CM | POA: Diagnosis not present

## 2024-08-18 DIAGNOSIS — R600 Localized edema: Secondary | ICD-10-CM | POA: Diagnosis not present

## 2024-08-18 DIAGNOSIS — N390 Urinary tract infection, site not specified: Secondary | ICD-10-CM

## 2024-08-18 DIAGNOSIS — N3001 Acute cystitis with hematuria: Secondary | ICD-10-CM | POA: Diagnosis not present

## 2024-08-18 DIAGNOSIS — S199XXA Unspecified injury of neck, initial encounter: Secondary | ICD-10-CM | POA: Diagnosis not present

## 2024-08-18 DIAGNOSIS — R262 Difficulty in walking, not elsewhere classified: Secondary | ICD-10-CM | POA: Diagnosis not present

## 2024-08-18 DIAGNOSIS — Z471 Aftercare following joint replacement surgery: Secondary | ICD-10-CM | POA: Diagnosis not present

## 2024-08-18 DIAGNOSIS — R55 Syncope and collapse: Secondary | ICD-10-CM | POA: Diagnosis present

## 2024-08-18 DIAGNOSIS — D649 Anemia, unspecified: Secondary | ICD-10-CM

## 2024-08-18 DIAGNOSIS — E440 Moderate protein-calorie malnutrition: Secondary | ICD-10-CM | POA: Diagnosis not present

## 2024-08-18 DIAGNOSIS — M1712 Unilateral primary osteoarthritis, left knee: Secondary | ICD-10-CM | POA: Diagnosis not present

## 2024-08-18 DIAGNOSIS — G20A1 Parkinson's disease without dyskinesia, without mention of fluctuations: Secondary | ICD-10-CM | POA: Diagnosis present

## 2024-08-18 DIAGNOSIS — I959 Hypotension, unspecified: Secondary | ICD-10-CM

## 2024-08-18 DIAGNOSIS — M79662 Pain in left lower leg: Secondary | ICD-10-CM | POA: Diagnosis not present

## 2024-08-18 DIAGNOSIS — Z96641 Presence of right artificial hip joint: Secondary | ICD-10-CM | POA: Diagnosis not present

## 2024-08-18 DIAGNOSIS — G20A2 Parkinson's disease without dyskinesia, with fluctuations: Secondary | ICD-10-CM | POA: Diagnosis not present

## 2024-08-18 DIAGNOSIS — W19XXXS Unspecified fall, sequela: Secondary | ICD-10-CM | POA: Diagnosis not present

## 2024-08-18 DIAGNOSIS — I951 Orthostatic hypotension: Secondary | ICD-10-CM

## 2024-08-18 DIAGNOSIS — M25552 Pain in left hip: Secondary | ICD-10-CM | POA: Diagnosis not present

## 2024-08-18 DIAGNOSIS — R531 Weakness: Secondary | ICD-10-CM | POA: Diagnosis not present

## 2024-08-18 DIAGNOSIS — M19012 Primary osteoarthritis, left shoulder: Secondary | ICD-10-CM | POA: Diagnosis not present

## 2024-08-18 DIAGNOSIS — M7732 Calcaneal spur, left foot: Secondary | ICD-10-CM | POA: Diagnosis not present

## 2024-08-18 DIAGNOSIS — M4802 Spinal stenosis, cervical region: Secondary | ICD-10-CM | POA: Diagnosis not present

## 2024-08-18 DIAGNOSIS — I351 Nonrheumatic aortic (valve) insufficiency: Secondary | ICD-10-CM | POA: Diagnosis not present

## 2024-08-18 DIAGNOSIS — S0990XA Unspecified injury of head, initial encounter: Secondary | ICD-10-CM | POA: Diagnosis not present

## 2024-08-18 DIAGNOSIS — M79622 Pain in left upper arm: Secondary | ICD-10-CM | POA: Diagnosis not present

## 2024-08-18 DIAGNOSIS — M79602 Pain in left arm: Secondary | ICD-10-CM

## 2024-08-18 DIAGNOSIS — W19XXXA Unspecified fall, initial encounter: Secondary | ICD-10-CM | POA: Diagnosis not present

## 2024-08-18 DIAGNOSIS — T796XXS Traumatic ischemia of muscle, sequela: Secondary | ICD-10-CM | POA: Diagnosis not present

## 2024-08-18 DIAGNOSIS — M6282 Rhabdomyolysis: Secondary | ICD-10-CM | POA: Diagnosis present

## 2024-08-18 LAB — URINALYSIS, W/ REFLEX TO CULTURE (INFECTION SUSPECTED)
Bilirubin Urine: NEGATIVE
Glucose, UA: NEGATIVE mg/dL
Ketones, ur: 5 mg/dL — AB
Nitrite: NEGATIVE
Protein, ur: 30 mg/dL — AB
Specific Gravity, Urine: 1.024 (ref 1.005–1.030)
pH: 6 (ref 5.0–8.0)

## 2024-08-18 LAB — COMPREHENSIVE METABOLIC PANEL WITH GFR
ALT: 23 U/L (ref 0–44)
AST: 40 U/L (ref 15–41)
Albumin: 3.7 g/dL (ref 3.5–5.0)
Alkaline Phosphatase: 95 U/L (ref 38–126)
Anion gap: 9 (ref 5–15)
BUN: 22 mg/dL (ref 8–23)
CO2: 29 mmol/L (ref 22–32)
Calcium: 9.8 mg/dL (ref 8.9–10.3)
Chloride: 106 mmol/L (ref 98–111)
Creatinine, Ser: 0.73 mg/dL (ref 0.44–1.00)
GFR, Estimated: >60 mL/min (ref >60)
Glucose, Bld: 96 mg/dL (ref 70–99)
Potassium: 4.2 mmol/L (ref 3.5–5.1)
Sodium: 143 mmol/L (ref 135–145)
Total Bilirubin: 0.4 mg/dL (ref 0.0–1.2)
Total Protein: 6.7 g/dL (ref 6.5–8.1)

## 2024-08-18 LAB — CBC
HCT: 30.2 % — ABNORMAL LOW (ref 36.0–46.0)
Hemoglobin: 10.4 g/dL — ABNORMAL LOW (ref 12.0–15.0)
MCH: 33.3 pg (ref 26.0–34.0)
MCHC: 34.4 g/dL (ref 30.0–36.0)
MCV: 96.8 fL (ref 80.0–100.0)
Platelets: 211 K/uL (ref 150–400)
RBC: 3.12 MIL/uL — ABNORMAL LOW (ref 3.87–5.11)
RDW: 14 % (ref 11.5–15.5)
WBC: 5 K/uL (ref 4.0–10.5)
nRBC: 0 % (ref 0.0–0.2)

## 2024-08-18 LAB — CK: Total CK: 728 U/L — ABNORMAL HIGH (ref 38–234)

## 2024-08-18 LAB — TROPONIN T, HIGH SENSITIVITY
Troponin T High Sensitivity: 50 ng/L — ABNORMAL HIGH (ref 0–19)
Troponin T High Sensitivity: 58 ng/L — ABNORMAL HIGH (ref 0–19)

## 2024-08-18 LAB — TSH: TSH: 0.814 u[IU]/mL (ref 0.350–4.500)

## 2024-08-18 MED ORDER — SODIUM CHLORIDE 0.9 % IV SOLN
2.0000 g | Freq: Once | INTRAVENOUS | Status: AC
  Administered 2024-08-18: 2 g via INTRAVENOUS
  Filled 2024-08-18: qty 20

## 2024-08-18 MED ORDER — SODIUM CHLORIDE 0.9 % IV BOLUS
1000.0000 mL | Freq: Once | INTRAVENOUS | Status: AC
  Administered 2024-08-18: 1000 mL via INTRAVENOUS

## 2024-08-18 MED ORDER — FOLIC ACID 1 MG PO TABS
1.0000 mg | ORAL_TABLET | Freq: Every day | ORAL | Status: DC
  Administered 2024-08-18 – 2024-08-25 (×8): 1 mg via ORAL
  Filled 2024-08-18 (×8): qty 1

## 2024-08-18 MED ORDER — HYDROCODONE-ACETAMINOPHEN 5-325 MG PO TABS: 1.0000 | ORAL_TABLET | ORAL | Status: DC | PRN

## 2024-08-18 MED ORDER — SENNOSIDES-DOCUSATE SODIUM 8.6-50 MG PO TABS: 1.0000 | ORAL_TABLET | Freq: Every evening | ORAL | Status: DC | PRN

## 2024-08-18 MED ORDER — LACTATED RINGERS IV SOLN: INTRAVENOUS | Status: DC

## 2024-08-18 MED ORDER — SODIUM CHLORIDE 0.9 % IV SOLN
1.0000 g | INTRAVENOUS | Status: DC
  Administered 2024-08-19 – 2024-08-20 (×2): 1 g via INTRAVENOUS
  Filled 2024-08-18 (×3): qty 10

## 2024-08-18 MED ORDER — VITAMIN B-12 1000 MCG PO TABS
1000.0000 ug | ORAL_TABLET | Freq: Every day | ORAL | Status: DC
  Administered 2024-08-18 – 2024-08-25 (×8): 1000 ug via ORAL
  Filled 2024-08-18 (×8): qty 1

## 2024-08-18 MED ORDER — ACETAMINOPHEN 650 MG RE SUPP: 650.0000 mg | Freq: Four times a day (QID) | RECTAL | Status: DC | PRN

## 2024-08-18 MED ORDER — ONDANSETRON HCL 4 MG PO TABS: 4.0000 mg | ORAL_TABLET | Freq: Four times a day (QID) | ORAL | Status: DC | PRN

## 2024-08-18 MED ORDER — ACETAMINOPHEN 325 MG PO TABS
650.0000 mg | ORAL_TABLET | Freq: Four times a day (QID) | ORAL | Status: DC | PRN
  Administered 2024-08-21: 650 mg via ORAL
  Filled 2024-08-18: qty 2

## 2024-08-18 MED ORDER — ONDANSETRON HCL 4 MG/2ML IJ SOLN
4.0000 mg | Freq: Four times a day (QID) | INTRAMUSCULAR | Status: DC | PRN
  Administered 2024-08-22 – 2024-08-24 (×2): 4 mg via INTRAVENOUS
  Filled 2024-08-18 (×2): qty 2

## 2024-08-18 MED ORDER — ENOXAPARIN SODIUM 40 MG/0.4ML IJ SOSY
40.0000 mg | PREFILLED_SYRINGE | INTRAMUSCULAR | Status: DC
  Administered 2024-08-18 – 2024-08-24 (×7): 40 mg via SUBCUTANEOUS
  Filled 2024-08-18 (×7): qty 0.4

## 2024-08-18 NOTE — H&P (Signed)
 History and Physical    Patient: Michele Brewer FMW:[id number] DOB: 1951-07-06 DOA: 08/18/2024 DOS: the patient was seen and examined on 08/18/2024 PCP: [name], MD  Patient coming from: Home  Chief Complaint:  Chief Complaint  Patient presents with   Fall    PT to ER via EMS from home for C/O fall injuring her left arm and hip   HPI: Michele Brewer is a 73 y.o. female with medical history significant of Parkinson's disease Presents to the ED for evaluation after a fall at home. She is unable to recall what happened, she says all she remembers is trying to get up off of the floor. She states she has some pain in her left foot that is worse than before the fall, but does not believe she sustained any significant injury.  She says otherwise she was in her usual state of health. She does note, since her surgery last month, she's had significant lower extremity edema.  She had her right hip replaced last month, and states that is healing well. She is undergoing PT at home where she lives with family. Since the surgery, she reports falling twice, however this is the first time she may have passed out. Also admits to having low BP readings ocassionally, but she is asymptomatic.   In the emergency department, she was hemodynamically stable.  No leukocytosis.  CT head, C-spine, and x-rays of the left humerus, left tib-fib, left hip all without acute abnormality.  Her UA was indicative of a UTI.  She was unable to ambulate in the emergency department.  Was given a dose of Rocephin  and the hospitalist was consulted for admission.  Review of Systems: Review of Systems  Constitutional:  Negative for chills, fever, malaise/fatigue and weight loss.  Eyes:  Negative for blurred vision and photophobia.  Respiratory:  Negative for cough and shortness of breath.   Cardiovascular:  Positive for leg swelling. Negative for chest pain.  Gastrointestinal:  Negative for abdominal pain, blood in stool,  constipation, diarrhea, heartburn, melena, nausea and vomiting.  Genitourinary:  Negative for dysuria, flank pain, frequency, hematuria and urgency.  Musculoskeletal:  Positive for falls and joint pain. Negative for back pain, myalgias and neck pain.  Skin:  Negative for rash.  Neurological:  Negative for dizziness, tingling, tremors, sensory change, speech change, focal weakness, weakness and headaches.    Past Medical History:  Diagnosis Date   Depression    Elevated CK 06/03/2023   Parkinson disease [facility])    Past Surgical History:  Procedure Laterality Date   CESAREAN SECTION     HIP ARTHROPLASTY Right 06/17/2024   Procedure: HEMIARTHROPLASTY (BIPOLAR) HIP, POSTERIOR APPROACH FOR FRACTURE;  Surgeon: Edie Norleen PARAS, MD;  Location: [facility] ORS;  Service: Orthopedics;  Laterality: Right;   Social History:  reports that she has never smoked. She has never used smokeless tobacco. She reports that she does not currently use alcohol . She reports that she does not use drugs.  Allergies  Allergen Reactions   Citrus     rash   Peanut-Containing Drug Products     Rash and itch    Family History  Problem Relation Age of Onset   Gout Mother    Heart disease Father     Prior to Admission medications   Medication Sig Start Date End Date Taking? Authorizing Provider  acetaminophen  (TYLENOL ) 325 MG tablet Take 2 tablets (650 mg total) by mouth every 6 (six) hours as needed for mild pain, moderate pain, fever or  headache (or Fever >/= 101). 07/18/22   Josette Ade, MD  buPROPion  (WELLBUTRIN  XL) 150 MG 24 hr tablet Take 1 tablet (150 mg total) by mouth daily. Patient not taking: Reported on 06/17/2024 06/12/24     carbidopa -levodopa  (SINEMET  IR) 25-100 MG tablet Take 1.5 tablets by mouth 2 (two) times daily. Patient not taking: Reported on 06/17/2024 07/18/22   Josette Ade, MD  carbidopa -levodopa  (SINEMET  IR) 25-100 MG tablet Take 1.5 tablets by mouth at bedtime. Patient not taking:  Reported on 06/17/2024 07/18/22   Josette Ade, MD  cyanocobalamin  1000 MCG tablet Take 1 tablet (1,000 mcg total) by mouth daily. 07/19/22   Josette Ade, MD  docusate sodium  (COLACE) 100 MG capsule Take 1 capsule (100 mg total) by mouth 2 (two) times daily. 06/19/24   Patel, Sona, MD  DULoxetine  (CYMBALTA ) 30 MG capsule Take 1 capsule (30 mg total) by mouth 2 (two) times daily. Patient not taking: Reported on 06/17/2024 11/22/22     enoxaparin  (LOVENOX ) 40 MG/0.4ML injection Inject 0.4 mLs (40 mg total) into the skin daily for 14 days. 06/20/24 07/04/24  Patel, Sona, MD  fluticasone  (FLONASE ) 50 MCG/ACT nasal spray Place 2 sprays into both nostrils daily. 06/10/23   Fausto Sor A, DO  folic acid  (FOLVITE ) 1 MG tablet Take 1 tablet (1 mg total) by mouth daily. 07/19/22   Josette Ade, MD  gabapentin  (NEURONTIN ) 100 MG capsule Take 1 capsule (100 mg total) by mouth 3 (three) times daily. Patient not taking: Reported on 06/17/2024 11/22/22     HYDROcodone -acetaminophen  (NORCO/VICODIN) 5-325 MG tablet Take 1-2 tablets by mouth every 6 (six) hours as needed for moderate pain (pain score 4-6) or severe pain (pain score 7-10). 06/19/24   Patel, Sona, MD  meclizine  (ANTIVERT ) 25 MG tablet Take 1 tablet (25 mg total) by mouth 3 (three) times daily as needed for dizziness. 06/09/23   Fausto Sor LABOR, DO  Multiple Vitamin (MULTIVITAMIN WITH MINERALS) TABS tablet Take 1 tablet by mouth daily. 07/19/22   Josette Ade, MD  Nutritional Supplements (FEEDING SUPPLEMENT, NEPRO CARB STEADY,) LIQD Take 237 mLs by mouth 3 (three) times daily between meals. 07/18/22   Josette Ade, MD  polyethylene glycol (MIRALAX  / GLYCOLAX ) 17 g packet Take 17 g by mouth daily as needed for moderate constipation. 07/18/22   Josette Ade, MD  thiamine  (VITAMIN B-1) 100 MG tablet Take 1 tablet (100 mg total) by mouth daily. 07/19/22   Josette Ade, MD  Vitamin D , Ergocalciferol , (DRISDOL ) 1.25 MG (50000 UNIT) CAPS  capsule Take 1 capsule (50,000 Units total) by mouth once a week. 06/06/24       Physical Exam: Vitals:   08/18/24 1200 08/18/24 1230 08/18/24 1430 08/18/24 1630  BP: 132/65 135/77 128/68 139/78  Pulse: (!) 107 (!) 102 98 94  Resp: 20 20 20 20   Temp:    98 F (36.7 C)  SpO2: 100% 99% 99% 99%  Weight:      Height:       Physical Exam Vitals and nursing note reviewed.  Constitutional:      General: She is not in acute distress.    Appearance: She is normal weight. She is not toxic-appearing.  HENT:     Head: Normocephalic and atraumatic.     Mouth/Throat:     Mouth: Mucous membranes are moist.  Eyes:     General: No scleral icterus.    Extraocular Movements: Extraocular movements intact.     Pupils: Pupils are equal, round, and reactive to  light.  Cardiovascular:     Rate and Rhythm: Normal rate and regular rhythm.  Pulmonary:     Effort: Pulmonary effort is normal. No respiratory distress.  Abdominal:     General: There is no distension.     Palpations: Abdomen is soft.     Tenderness: There is no abdominal tenderness. There is no guarding.  Musculoskeletal:        General: No tenderness.     Cervical back: Neck supple.     Right lower leg: Edema present.     Left lower leg: Edema present.  Skin:    General: Skin is warm and dry.     Capillary Refill: Capillary refill takes less than 2 seconds.  Neurological:     Mental Status: She is alert and oriented to person, place, and time. Mental status is at baseline.     Comments: Resting tremmor in b/l upper extemities    Psychiatric:        Mood and Affect: Mood normal.        Behavior: Behavior normal.     Data Reviewed:   Labs on Admission: I have personally reviewed following labs and imaging studies  CBC: Recent Labs  Lab 08/18/24 1418  WBC 5.0  HGB 10.4*  HCT 30.2*  MCV 96.8  PLT 211   Basic Metabolic Panel: Recent Labs  Lab 08/18/24 1418  NA 143  K 4.2  CL 106  CO2 29  GLUCOSE 96  BUN 22   CREATININE 0.73  CALCIUM 9.8   GFR: Estimated Creatinine Clearance: 53.3 mL/min (by C-G formula based on SCr of 0.73 mg/dL). Liver Function Tests: Recent Labs  Lab 08/18/24 1418  AST 40  ALT 23  ALKPHOS 95  BILITOT 0.4  PROT 6.7  ALBUMIN 3.7   No results for input(s): LIPASE, AMYLASE in the last 168 hours. No results for input(s): AMMONIA in the last 168 hours. Coagulation Profile: No results for input(s): INR, PROTIME in the last 168 hours. Cardiac Enzymes: Recent Labs  Lab 08/18/24 1418  CKTOTAL 728*   BNP (last 3 results) Recent Labs    08/02/24 0729  PROBNP 139.0   HbA1C: No results for input(s): HGBA1C in the last 72 hours. CBG: No results for input(s): GLUCAP in the last 168 hours. Lipid Profile: No results for input(s): CHOL, HDL, LDLCALC, TRIG, CHOLHDL, LDLDIRECT in the last 72 hours. Thyroid  Function Tests: No results for input(s): TSH, T4TOTAL, FREET4, T3FREE, THYROIDAB in the last 72 hours. Anemia Panel: No results for input(s): VITAMINB12, FOLATE, FERRITIN, TIBC, IRON , RETICCTPCT in the last 72 hours. Urine analysis:    Component Value Date/Time   COLORURINE YELLOW (A) 08/18/2024 1320   APPEARANCEUR HAZY (A) 08/18/2024 1320   LABSPEC 1.024 08/18/2024 1320   PHURINE 6.0 08/18/2024 1320   GLUCOSEU NEGATIVE 08/18/2024 1320   HGBUR SMALL (A) 08/18/2024 1320   BILIRUBINUR NEGATIVE 08/18/2024 1320   KETONESUR 5 (A) 08/18/2024 1320   PROTEINUR 30 (A) 08/18/2024 1320   NITRITE NEGATIVE 08/18/2024 1320   LEUKOCYTESUR SMALL (A) 08/18/2024 1320    Radiological Exams on Admission: DG Tibia/Fibula Left Result Date: 08/18/2024 CLINICAL DATA:  Fall.  Left-sided pain. EXAM: LEFT TIBIA AND FIBULA - 2 VIEW COMPARISON:  None Available. FINDINGS: No acute fracture involving the left tibia or fibula. Calcaneal spurring. Bilateral soft tissue swelling at the left ankle. Left ankle is located. Left knee is located.  Degenerative changes in left knee. IMPRESSION: 1. No acute bone abnormality to the left  tibia or fibula. 2. Soft tissue swelling at the left ankle. Electronically Signed   By: Juliene Balder M.D.   On: 08/18/2024 13:41   DG HIP UNILAT WITH PELVIS 2-3 VIEWS LEFT Result Date: 08/18/2024 CLINICAL DATA:  Left-sided pain.  Status post fall. EXAM: DG HIP (WITH OR WITHOUT PELVIS) 2-3V LEFT COMPARISON:  06/17/2024 FINDINGS: Status post right hip arthroplasty. The right femoral stem is not completely imaged. Pelvic bony ring is intact. Left hip is located without acute fracture. IMPRESSION: 1. No acute abnormality to the left hip. 2. Status post right hip arthroplasty. Electronically Signed   By: Juliene Balder M.D.   On: 08/18/2024 13:39   DG Humerus Left Result Date: 08/18/2024 CLINICAL DATA:  Fall.  Left-sided pain. EXAM: LEFT HUMERUS - 2+ VIEW COMPARISON:  None Available. FINDINGS: Left humerus is intact without a fracture. Mild elevation of the distal left clavicle. Degenerative changes at the left [facility] joint. Evidence for old left rib fractures. Limited evaluation of the left elbow. IMPRESSION: 1. No acute bone abnormality to the left humerus. 2. Mild elevation of the distal left clavicle. Findings could be associated with an age-indeterminate AC joint injury. Recommend clinical correlation in this area. Electronically Signed   By: Juliene Balder M.D.   On: 08/18/2024 13:38   CT Head Wo Contrast Result Date: 08/18/2024 EXAM: CT HEAD WITHOUT 08/18/2024 12:00:32 PM TECHNIQUE: CT of the head was performed without the administration of intravenous contrast. Automated exposure control, iterative reconstruction, and/or weight based adjustment of the mA/kV was utilized to reduce the radiation dose to as low as reasonably achievable. COMPARISON: 06/02/2023 CLINICAL HISTORY: Head trauma, minor (Age >= 65y) FINDINGS: BRAIN AND VENTRICLES: No acute intracranial hemorrhage. No mass effect or midline shift. No extra-axial fluid  collection. No evidence of acute infarct. No hydrocephalus. Cerebral ventricle sizes concordant with degree of cerebral volume loss. ORBITS: No acute abnormality. SINUSES AND MASTOIDS: No acute abnormality. SOFT TISSUES AND SKULL: No acute skull fracture. No acute soft tissue abnormality. IMPRESSION: 1. No acute intracranial abnormality related to head trauma. 2. Cerebral ventricle sizes concordant with degree of cerebral volume loss. Electronically signed by: Evalene Coho MD 08/18/2024 12:13 PM EST RP Workstation: [id number]   CT Cervical Spine Wo Contrast Result Date: 08/18/2024 CLINICAL DATA:  Neck trauma (Age >= 65y) EXAM: CT CERVICAL SPINE WITHOUT CONTRAST TECHNIQUE: Multidetector CT imaging of the cervical spine was performed without intravenous contrast. Multiplanar CT image reconstructions were also generated. RADIATION DOSE REDUCTION: This exam was performed according to the departmental dose-optimization program which includes automated exposure control, adjustment of the mA and/or kV according to patient size and/or use of iterative reconstruction technique. COMPARISON:  MRI cervical spine 07/15/2022. CT cervical spine 02/03/2021. FINDINGS: Alignment: Chronic reversal of the usual cervical lordosis, similar to previous CT. No focal angulation or significant listhesis. Skull base and vertebrae: No evidence of acute cervical spine fracture or traumatic subluxation. Soft tissues and spinal canal: No prevertebral fluid or swelling. No visible canal hematoma. Disc levels: Chronic spondylosis with disc space narrowing, uncinate spurring and facet hypertrophy. Chronic interfacetal ankylosis on the right at C2-3. Chronic foraminal narrowing, greatest on the right at C4-5 and on the left at C6-7. No large disc herniation identified. Upper chest: Clear lung apices. Other: Asymmetric TMJ degenerative changes on the left. IMPRESSION: 1. No evidence of acute cervical spine fracture, traumatic subluxation or  static signs of instability. 2. Chronic cervical spondylosis as described. Electronically Signed   By: Elsie Gertrude HERO.D.  On: 08/18/2024 12:12       Assessment and Plan: 73 year old female with Parkinson's disease presents to the emergency department after a fall for syncopal episode at home.  Found to have a possible UTI.  She was unable to ambulate in the emergency department.  Admitted observation for syncope workup, physical therapy evaluation, and treatment of possible UTI   Fall vs Syncope  Ambulatory dysfunction - CT head and C-spine without abnormality - Check orthostatics tomorrow - Fall precautions - Physical therapy evaluation - monitor on telemetry  - ECHO   Abnormal urinalysis - Empiric Rocephin  - Urine culture pending  Elevated CK - mild elevation in setting of a fall, not in rhabdo range. Renal function intact.  - gentle IVF and trend   Parkinson's disease Depression - resume home medications when reconciled   Nutritional deficiencies  -  B12, folate, and iron  deficiencies noted during last hospitalization - continue supplementation  - Consult registered dietitian, appreciate assistance  Chronic anemia  - stable, monitor.   LR@75  Reg diet Monitor/replace electrolytes  Lovenox    Advance Care Planning:   Code Status: Do not attempt resuscitation (DNR) PRE-ARREST INTERVENTIONS DESIRED discussed with patient at time of admission    Severity of Illness: The appropriate patient status for this patient is OBSERVATION. Observation status is judged to be reasonable and necessary in order to provide the required intensity of service to ensure the patient's safety. The patient's presenting symptoms, physical exam findings, and initial radiographic and laboratory data in the context of their medical condition is felt to place them at decreased risk for further clinical deterioration. Furthermore, it is anticipated that the patient will be medically stable for  discharge from the hospital within 2 midnights of admission.   Author: Daved JAYSON Pump, DO 08/18/2024 5:25 PM  For on call review [email or web address].

## 2024-08-18 NOTE — ED Provider Notes (Signed)
 [facility], [place] Provider Note    Event Date/Time   First MD Initiated Contact with Patient 08/18/24 1105     (approximate)   History   Fall (PT to ER via EMS from home for C/O fall injuring her left arm and hip)   HPI  Michele Brewer is a 73 y.o. female past medical history significant for Parkinson's disease, depression, recent displaced right femoral neck fracture status post replacement, iron  deficiency anemia, presents to the emergency department following a fall.  Patient states that she got tripped up and had a fall landing on her left side.  Uncertain of what caused her to fall.  Denies passing out.  Denies any chest pain or shortness of breath.  Complaining of pain to her left hip, left arm and left lower leg.  States that she landed on her left side.  Does not believe that she hit her head or lost consciousness.  Not on anticoagulation.  States that she had a recent hip replacement on the right side.  Denies nausea, vomiting, dysuria, urinary urgency or frequency.  States that she lives with her mother and another family member.     Physical Exam   Triage Vital Signs: ED Triage Vitals  Encounter Vitals Group     BP      Girls Systolic BP Percentile      Girls Diastolic BP Percentile      Boys Systolic BP Percentile      Boys Diastolic BP Percentile      Pulse      Resp      Temp      Temp src      SpO2      Weight      Height      Head Circumference      Peak Flow      Pain Score      Pain Loc      Pain Education      Exclude from Growth Chart     Most recent vital signs: Vitals:   08/18/24 1120  BP: (!) 118/56  Pulse: (!) 105  Resp: 20  Temp: 98.6 F (37 C)  SpO2: 100%    Physical Exam Constitutional:      Appearance: She is well-developed.  HENT:     Head: Atraumatic.  Eyes:     Conjunctiva/sclera: Conjunctivae normal.  Cardiovascular:     Rate and Rhythm: Regular rhythm. Tachycardia present.  Pulmonary:     Effort: No  respiratory distress.  Abdominal:     General: There is no distension.     Tenderness: There is no abdominal tenderness. There is no right CVA tenderness or left CVA tenderness.  Musculoskeletal:        General: Normal range of motion.     Cervical back: Normal range of motion. No tenderness.     Right lower leg: No edema.     Left lower leg: No edema.     Comments: No midline thoracic or lumbar tenderness to palpation.  Mild tenderness palpation to the left arm.  Mild tenderness to palpation with range of motion to the left hip.  Abrasion to the left knee.  Mild tenderness to palpation to the left lower leg.  +2 DP pulses that are equal bilaterally.  No tenderness to palpation to the right upper extremity or right lower extremity.  Skin:    General: Skin is warm.     Capillary Refill: Capillary refill takes less  than 2 seconds.  Neurological:     Mental Status: She is alert. Mental status is at baseline.     Comments: Tremulous  Psychiatric:        Mood and Affect: Mood normal.     IMPRESSION / MDM / ASSESSMENT AND PLAN / ED COURSE  I reviewed the triage vital signs and the nursing notes.  Differential diagnosis including hip fracture, dislocation, musculoskeletal strain, intracranial hemorrhage, concussion, cervical spine fracture, electrolyte abnormality, dehydration, fall secondary to Parkinson's disease, rhabdomyolysis  Uncertain of the etiology of her falls will obtain lab work and an EKG.  Plan for CT scan and x-ray imaging  Unknown downtime so we will obtain a CK, waiting on family members for further information  EKG  I, Clotilda Punter, the attending physician, personally viewed and interpreted this ECG.  Significant artifact secondary to her tremors.  Rating as a heart rate of 107.  Narrow complex.  Normal QTc.  Nonspecific ST changes.  Increased heart rate when compared to prior  RADIOLOGY I independently reviewed imaging, my interpretation of imaging: X-ray of the left  hip with no obvious fracture or dislocation  X-ray of the left humerus with no acute fracture.  Questionable AC separation patient has no tenderness to palpation to this area and have a very low suspicion  X-ray of the left tib-fib no acute fracture  CT scan of the head with no signs of intracranial hemorrhage  CT scan cervical spine with no acute fracture or dislocation  LABS (all labs ordered are listed, but only abnormal results are displayed) Labs interpreted as -    Labs Reviewed  CBC - Abnormal; Notable for the following components:      Result Value   RBC 3.12 (*)    Hemoglobin 10.4 (*)    HCT 30.2 (*)    All other components within normal limits  URINALYSIS, W/ REFLEX TO CULTURE (INFECTION SUSPECTED)  CK  COMPREHENSIVE METABOLIC PANEL WITH GFR  TROPONIN T, HIGH SENSITIVITY  TROPONIN T, HIGH SENSITIVITY     MDM  Patient with recurrent fall.  No evidence of fracture or dislocation.  No signs of intracranial hemorrhage.  No concern for basilar skull fracture.  No concern for ligamentous injury and no significant neck pain.  Will attempt to ambulate with her walker.  Multiple attempts of an IV, difficult lab draw, lab coming to draw her labs  Plan for ambulation trial with her home walker.  Lab work still in process.  If able to ambulate with her walker and lab work is reassuring plan to discharge back to home.  Clinical Course as of 08/18/24 1513  Mon Aug 18, 2024  1503 S/o from Dr. Punter 87F hx parkinsons w/ recent fall w/ r hip fx, now s/p replacement Another fall today, unclear exact cause Traumatic workup reassuring Labs in process Lives at home with her mother  Pending labs Ambu trial  If labs and ambu trial reassuring, possible DC [MM]    Clinical Course User Index [MM] Clarine Ozell LABOR, MD     PROCEDURES:  Critical Care performed: No  Procedures  Patient's presentation is most consistent with acute presentation with potential threat to life  or bodily function.   MEDICATIONS ORDERED IN ED: Medications - No data to display  FINAL CLINICAL IMPRESSION(S) / ED DIAGNOSES   Final diagnoses:  Fall, initial encounter  Left hip pain  Left arm pain     Rx / DC Orders   ED Discharge Orders  None        Note:  This document was prepared using Dragon voice recognition software and may include unintentional dictation errors.   Suzanne Kirsch, MD 08/18/24 [phone]

## 2024-08-18 NOTE — ED Notes (Signed)
 CCMD called to place pt on cardiac monitoring per RN

## 2024-08-18 NOTE — ED Provider Notes (Signed)
  Physical Exam  BP (!) 118/56   Pulse (!) 105   Temp 98.6 F (37 C)   Resp 20   Ht 5' 4 (1.626 m)   Wt 63.5 kg   SpO2 100%   BMI 24.03 kg/m   Physical Exam  Procedures  Procedures  ED Course / MDM   Clinical Course as of 08/18/24 1640  Mon Aug 18, 2024  1503 S/o from Dr. Suzanne 66F hx parkinsons w/ recent fall w/ r hip fx, now s/p replacement Another fall today, unclear exact cause Traumatic workup reassuring Labs in process Lives at home with her mother  Pending labs Ambu trial  If labs and ambu trial reassuring, possible DC [MM]  1545 CBC unremarkable, at baseline  CK somewhat elevated consistent with mild rhabdomyolysis  CMP reviewed, unremarkable  Troponin mildly elevated, no complaints of chest pain, EKG nonischemic, interpretation, will trend  Mild tachycardia on arrival.  Suspect some degree of dehydration.  Giving IV fluid. [MM]  1601 UA c/w infxn [MM]  1623 Patient reevaluated, believes she fell sometime between midnight and 2 AM, sounds as if she was on the ground for prolonged time, was too weak to get up and move around.  Attempted to ambulate patient here but she was too weak to stand on her own.  Bedside RN did speak with patient's family who note that patient was able to get up with PT and do exercises recently, usually does not require assistance with ambulation  Patient says she continues to feel weak.  Getting IV fluid and treating underlying UTI.  Anticipate would benefit from admission given her generalized weakness in the setting of UTI as well as a fall and mild rhabdo.  Hospitalist consult order placed. [MM]  1640 Presented to hospitalist for admission. [MM]    Clinical Course User Index [MM] Clarine Ozell LABOR, MD   Medical Decision Making Amount and/or Complexity of Data Reviewed Labs: ordered. Radiology: ordered.  Risk Decision regarding hospitalization.          Clarine Ozell LABOR, MD 08/18/24 1640

## 2024-08-19 ENCOUNTER — Observation Stay
Admit: 2024-08-19 | Discharge: 2024-08-19 | Disposition: A | Attending: Emergency Medicine | Admitting: Emergency Medicine

## 2024-08-19 DIAGNOSIS — G20B2 Parkinson's disease with dyskinesia, with fluctuations: Secondary | ICD-10-CM

## 2024-08-19 DIAGNOSIS — R600 Localized edema: Secondary | ICD-10-CM | POA: Diagnosis not present

## 2024-08-19 DIAGNOSIS — W19XXXA Unspecified fall, initial encounter: Secondary | ICD-10-CM

## 2024-08-19 DIAGNOSIS — D649 Anemia, unspecified: Secondary | ICD-10-CM | POA: Diagnosis not present

## 2024-08-19 DIAGNOSIS — N390 Urinary tract infection, site not specified: Secondary | ICD-10-CM

## 2024-08-19 DIAGNOSIS — E440 Moderate protein-calorie malnutrition: Secondary | ICD-10-CM

## 2024-08-19 DIAGNOSIS — I9589 Other hypotension: Secondary | ICD-10-CM | POA: Diagnosis not present

## 2024-08-19 DIAGNOSIS — T796XXA Traumatic ischemia of muscle, initial encounter: Secondary | ICD-10-CM

## 2024-08-19 DIAGNOSIS — R930 Abnormal findings on diagnostic imaging of skull and head, not elsewhere classified: Secondary | ICD-10-CM | POA: Diagnosis not present

## 2024-08-19 DIAGNOSIS — R531 Weakness: Secondary | ICD-10-CM | POA: Diagnosis not present

## 2024-08-19 DIAGNOSIS — G20A1 Parkinson's disease without dyskinesia, without mention of fluctuations: Secondary | ICD-10-CM | POA: Diagnosis not present

## 2024-08-19 DIAGNOSIS — R55 Syncope and collapse: Secondary | ICD-10-CM

## 2024-08-19 DIAGNOSIS — I959 Hypotension, unspecified: Secondary | ICD-10-CM | POA: Diagnosis not present

## 2024-08-19 LAB — ECHOCARDIOGRAM COMPLETE
AR max vel: 3.39 cm2
AV Area VTI: 2.98 cm2
AV Area mean vel: 3.09 cm2
AV Mean grad: 4 mmHg
AV Peak grad: 7.5 mmHg
Ao pk vel: 1.37 m/s
Area-P 1/2: 3.72 cm2
Calc EF: 76.5 %
Height: 64 in
MV M vel: 4.9 m/s
MV Peak grad: 95.8 mmHg
MV VTI: 2.84 cm2
P 1/2 time: 493 ms
Radius: 0.55 cm
S' Lateral: 2.4 cm
Single Plane A2C EF: 81.2 %
Single Plane A4C EF: 71.7 %
Weight: 2240 [oz_av]

## 2024-08-19 LAB — URINE CULTURE: Culture: NO GROWTH

## 2024-08-19 LAB — CBC
HCT: 28.3 % — ABNORMAL LOW (ref 36.0–46.0)
Hemoglobin: 9.2 g/dL — ABNORMAL LOW (ref 12.0–15.0)
MCH: 30.7 pg (ref 26.0–34.0)
MCHC: 32.5 g/dL (ref 30.0–36.0)
MCV: 94.3 fL (ref 80.0–100.0)
Platelets: 180 K/uL (ref 150–400)
RBC: 3 MIL/uL — ABNORMAL LOW (ref 3.87–5.11)
RDW: 14.1 % (ref 11.5–15.5)
WBC: 5 K/uL (ref 4.0–10.5)
nRBC: 0 % (ref 0.0–0.2)

## 2024-08-19 LAB — BASIC METABOLIC PANEL WITH GFR
Anion gap: 10 (ref 5–15)
BUN: 21 mg/dL (ref 8–23)
CO2: 26 mmol/L (ref 22–32)
Calcium: 9.2 mg/dL (ref 8.9–10.3)
Chloride: 107 mmol/L (ref 98–111)
Creatinine, Ser: 0.69 mg/dL (ref 0.44–1.00)
GFR, Estimated: >60 mL/min (ref >60)
Glucose, Bld: 82 mg/dL (ref 70–99)
Potassium: 3.8 mmol/L (ref 3.5–5.1)
Sodium: 142 mmol/L (ref 135–145)

## 2024-08-19 LAB — VITAMIN D 25 HYDROXY (VIT D DEFICIENCY, FRACTURES): Vit D, 25-Hydroxy: 43.82 ng/mL (ref 30–100)

## 2024-08-19 LAB — C-REACTIVE PROTEIN: CRP: <0.5 mg/dL (ref <1.0)

## 2024-08-19 LAB — VITAMIN B12: Vitamin B-12: 324 pg/mL (ref 180–914)

## 2024-08-19 LAB — MAGNESIUM: Magnesium: 1.8 mg/dL (ref 1.7–2.4)

## 2024-08-19 LAB — CK: Total CK: 1713 U/L — ABNORMAL HIGH (ref 38–234)

## 2024-08-19 MED ORDER — POLYETHYLENE GLYCOL 3350 17 G PO PACK: 17.0000 g | PACK | Freq: Every day | ORAL | Status: DC | PRN

## 2024-08-19 MED ORDER — LACTATED RINGERS IV SOLN: INTRAVENOUS | Status: DC

## 2024-08-19 MED ORDER — ADULT MULTIVITAMIN W/MINERALS CH
1.0000 | ORAL_TABLET | Freq: Every day | ORAL | Status: DC
  Administered 2024-08-19 – 2024-08-25 (×7): 1 via ORAL
  Filled 2024-08-19 (×7): qty 1

## 2024-08-19 MED ORDER — KATE FARMS STANDARD 1.4 EN LIQD
325.0000 mL | Freq: Three times a day (TID) | ENTERAL | Status: DC
  Administered 2024-08-20 – 2024-08-24 (×2): 325 mL via ORAL
  Filled 2024-08-19: qty 325

## 2024-08-19 MED ORDER — CARBIDOPA-LEVODOPA 25-100 MG PO TABS
1.5000 | ORAL_TABLET | Freq: Three times a day (TID) | ORAL | Status: DC
  Administered 2024-08-19 – 2024-08-25 (×18): 1.5 via ORAL
  Filled 2024-08-19 (×18): qty 2

## 2024-08-19 NOTE — Progress Notes (Addendum)
 Initial Nutrition Assessment  DOCUMENTATION CODES:   Non-severe (moderate) malnutrition in context of chronic illness  INTERVENTION:   -MVI with minerals daily -Continue regular diet -[employer] 1.4 PO TID, each supplement provides 455 kcal and 20 grams protein -Obtained food preferences and allergies and entered into [employer] Meal ordering system  -Will draw labs to check for micronutrient deficiencies which may be linked to possible nutrition related causes for muscle weakness, ataxia, and chronic sinemet  use: thiamine , vitamin B6, biotin, vitamin E, copper , manganese, vitamin D , vitamin C . Also check CRP to best interpret lab values  NUTRITION DIAGNOSIS:   Moderate Malnutrition related to chronic illness (Parkinson's) as evidenced by mild fat depletion, moderate fat depletion, moderate muscle depletion, severe muscle depletion, edema.  GOAL:   Patient will meet greater than or equal to 90% of their needs  MONITOR:   PO intake, Supplement acceptance  REASON FOR ASSESSMENT:   Consult Assessment of nutrition requirement/status  ASSESSMENT:   73 year old female with Parkinson's disease presents after a fall for syncopal episode at home.  Found to have a possible UTI.  She was unable to ambulate in the emergency department.  Admitted observation for syncope workup, physical therapy evaluation, and treatment of possible UTI  Patient admitted with fall vs syncope and ambulatory dysfunction.  Reviewed I/O's: +658 ml x 24 hours  UOP: 750 ml x 24 hours  Per H&P, patient underwent right hip replacement last month; she was healing well and participating in home health PT PTA. Patient had fallen twice PTA since last discharge, but only passed out this last time (suspect due to low BP for which she is asymptomatic). Patient was hemodynamically stable per MD and CT head, C-spine, and x-rays of the left humerus, left tib-fib, left hip all without acute abnormality.   Spoke with  patient at bedside, who was pleasant and in good spirits today. Patient was working with PT at time of visit, who reports her goal was to transfer to chair this session; patient eager to try my best.   Patient reports that she has a good appetite PTA. She generally consumes 2 meals per day, which consist of chicken and salad. 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 She also has a lot of food preferences and things that she does not eat, such as beef, which is why she did not eat much today. Observed breakfast tray- she consumed 100% of apple juice and 50% of scrambled eggs. She denies any difficulty chewing or swallowing.   Noted patient with history of Parkinson's diease, on sinemet . Patient with tremors to bilateral hands and arms; patient reports no changes to intensity of tremors (they are always like this, I've been dealing with this for 30 years). Patient denies any difficulty preparing meals or completing ADLs at home. She also denies difficulty feeding herself or opening containers.   Patient currently on a regular diet, which RD will continue for widest variety of meal selections given multiple food preferences.   Patient denies any weight loss, reports her UBW is around 140-145#. Reviewed weight history. Per [facility], weight was 66.3 kg on 06/12/2024 and 63.6 kg on 12/24/23. Weight has been stable over the past 8 months, however, suspect edema may be masking true weight loss as well as fat and muscle depletions. Patient with severe edema on exam today and deep pitting edema per nursing assessment.   Per H&P, patient  with B-12, folate, and iron  deficiencies. RD was consulted to evaluate for further micronutrient deficiencies. Patient on sinemet  chronically, which is linked to potential vitamin B6 deficiency. Additionally, will check micronutrient deficiencies which may be linked to  possible nutrition related causes for muscle weakness and ataxia (thiamine , vitamin B6, biotin, vitamin E, copper , manganese, vitamin D , vitamin C ).   Discussed importance of good meal and supplement intake to promote healing. Patient amenable to supplements. RD discussed that she was given Ensure supplements during last admission. However, patient admits to not drinking them due to fear of having lactose. RD informed patient that they are lactose free, however, she is still hesitant to drink them, but is willing to try an alternative lactose free supplement.   Medications reviewed and include vitamin B-12, lovenox , folic acid , and lactated ringers  infusion @ 75 ml/hr.   Labs reviewed. Vitamin B-12 WDL.   NUTRITION - FOCUSED PHYSICAL EXAM:  Flowsheet Row Most Recent Value  Orbital Region Mild depletion  Upper Arm Region Mild depletion  Thoracic and Lumbar Region No depletion  Buccal Region Mild depletion  Temple Region Moderate depletion  Clavicle Bone Region Moderate depletion  Clavicle and Acromion Bone Region Moderate depletion  Scapular Bone Region Moderate depletion  Dorsal Hand Mild depletion  Patellar Region No depletion  [deep pitting edema]  Anterior Thigh Region No depletion  [deep pitting edema]  Posterior Calf Region No depletion  [deep pitting edema]  Edema (RD Assessment) Severe  Hair Reviewed  Eyes Reviewed  Mouth Reviewed  Skin Reviewed  Nails Reviewed    Diet Order:   Diet Order             Diet regular Room service appropriate? Yes; Fluid consistency: Thin  Diet effective now                   EDUCATION NEEDS:   Education needs have been addressed  Skin:  Skin Assessment: Reviewed RN Assessment  Last BM:  Unknown  Height:   Ht Readings from Last 1 Encounters:  08/18/24 5' 4 (1.626 m)    Weight:   Wt Readings from Last 1 Encounters:  08/18/24 63.5 kg    Ideal Body Weight:  54.5 kg  BMI:  Body mass index is 24.03 kg/m.  Estimated  Nutritional Needs:   Kcal:  1900-2100  Protein:  105-120 grams  Fluid:  1.9-2.1 L    Margery ORN, RD, LDN, CDCES Registered Dietitian III Certified Diabetes Care and Education Specialist If unable to reach this RD, please use RD Inpatient group chat on secure chat between hours of 8am-4 pm daily

## 2024-08-19 NOTE — Progress Notes (Signed)
 Progress Note   Patient: Michele Brewer FMW:[id number] DOB: 08-31-1951 DOA: 08/18/2024     0 DOS: the patient was seen and examined on 08/19/2024   Brief hospital course: Partly taken from H&P.  Michele Brewer is a 73 y.o. female with medical history significant of Parkinson's disease, Presents to the ED for evaluation after a fall at home. She is unable to recall what happened.  In the emergency department, she was hemodynamically stable.  No leukocytosis.  CK elevated at 728, CT head, C-spine, and x-rays of the left humerus, left tib-fib, left hip all without acute abnormality.  Her UA was indicative of a UTI.  She was unable to ambulate in the emergency department.   She was started on ceftriaxone , urine cultures were obtained.  Was admitted for concern of fall versus syncope with ambulatory dysfunction.  12/2: Vital stable, slight worsening of CK to 1713, continuing IV fluid, B12 of 324, urine cultures pending.  Assessment and Plan: * Syncope Syncope versus fall. History of ambulatory dysfunction. Patient does not remember how she fell. Orthostatic vitals negative. History of significant ambulatory dysfunction with Parkinson's.  UA also concerning for UTI that might be contributory.  Echocardiogram with normal EF, mildly elevated pulmonary arterial pressure. -PT and OT evaluation - Continue to monitor   Rhabdomyolysis CK at 1713, likely secondary to fall. - Giving some more IV fluid -Monitor CK  UTI (urinary tract infection) UA concerning for UTI which can be contributed to progressive weakness. - Urine cultures pending -Continue with ceftriaxone  -Follow-up urine cultures  Malnutrition of moderate degree Estimated body mass index is 24.03 kg/m as calculated from the following:   Height as of this encounter: 5' 4 (1.626 m).   Weight as of this encounter: 63.5 kg.   - Dietitian was consulted -Multiple nutritional labs ordered -Continue with supplemental  Parkinson  disease (HCC) Per patient she was taking Sinemet , per med rec there was no dispense history. - Pharmacy to reconfirm -We will restart if she was taking it  Chronic anemia Hemoglobin seems stable.  Multiple deficiencies include iron , B12 and folate noted on last hospitalization. - Continue with supplement      Subjective: Patient was seen and examined today.  She could not remember why she fell.  She found herself on the ground.  She was having some urinary incontinence for some time. Asking about her Parkinson's medication.  Physical Exam: Vitals:   08/19/24 0028 08/19/24 0442 08/19/24 0852 08/19/24 1218  BP: (!) 116/54 (!) 111/54 113/71 (!) 110/58  Pulse: 92 72 71 80  Resp: 20 20 18 18   Temp: 98.9 F (37.2 C) 98.9 F (37.2 C) 98.5 F (36.9 C) 97.9 F (36.6 C)  TempSrc: Oral Oral Oral Oral  SpO2: 98% 96% 94% 98%  Weight:      Height:       General.  Frail elderly lady, in no acute distress. Pulmonary.  Lungs clear bilaterally, normal respiratory effort. CV.  Regular rate and rhythm, no JVD, rub or murmur. Abdomen.  Soft, nontender, nondistended, BS positive. CNS.  Alert and oriented .  No focal neurologic deficit. Extremities.  No edema, pulses intact and symmetrical.   Data Reviewed: Prior data reviewed  Family Communication: Talked with friend listed in her chart.  Disposition: Status is: Inpatient Remains inpatient appropriate because: Severity of illness  Planned Discharge Destination: Home with Home Health  DVT prophylaxis.  Lovenox  Time spent: 50 minutes  This record has been created using Conservation officer, historic buildings. Errors  have been sought and corrected,but may not always be located. Such creation errors do not reflect on the standard of care.   Author: Amaryllis Dare, MD 08/19/2024 1:44 PM  For on call review [email or web address].

## 2024-08-19 NOTE — Assessment & Plan Note (Signed)
 Syncope versus fall. History of ambulatory dysfunction. Patient does not remember how she fell. Orthostatic vitals negative. History of significant ambulatory dysfunction with Parkinson's.  UA also concerning for UTI that might be contributory.  Echocardiogram with normal EF, mildly elevated pulmonary arterial pressure. -PT and OT evaluation - Continue to monitor

## 2024-08-19 NOTE — Hospital Course (Addendum)
 Partly taken from H&P.  Michele Brewer is a 73 y.o. female with medical history significant of Parkinson's disease, Presents to the ED for evaluation after a fall at home. She is unable to recall what happened.  In the emergency department, she was hemodynamically stable.  No leukocytosis.  CK elevated at 728, CT head, C-spine, and x-rays of the left humerus, left tib-fib, left hip all without acute abnormality.  Her UA was indicative of a UTI.  She was unable to ambulate in the emergency department.   She was started on ceftriaxone , urine cultures were obtained.  Was admitted for concern of fall versus syncope with ambulatory dysfunction.  12/2: Vital stable, slight worsening of CK to 1713, continuing IV fluid, B12 of 324, urine cultures pending. 12/5.  Peer to peer done and does not look promising for rehab.  Patient states she is falling over to the left.  Will get MRI of the brain.  ABIs ordered prior to compression wraps.

## 2024-08-19 NOTE — Assessment & Plan Note (Signed)
 UA concerning for UTI which can be contributed to progressive weakness. - Urine cultures pending -Continue with ceftriaxone  -Follow-up urine cultures

## 2024-08-19 NOTE — Assessment & Plan Note (Signed)
 CK at 1713, likely secondary to fall. - Giving some more IV fluid -Monitor CK

## 2024-08-19 NOTE — Assessment & Plan Note (Signed)
 Per patient she was taking Sinemet , per med rec there was no dispense history. - Pharmacy to reconfirm -We will restart if she was taking it

## 2024-08-19 NOTE — Assessment & Plan Note (Signed)
 Estimated body mass index is 24.03 kg/m as calculated from the following:   Height as of this encounter: 5' 4 (1.626 m).   Weight as of this encounter: 63.5 kg.   - Dietitian was consulted -Multiple nutritional labs ordered -Continue with supplemental

## 2024-08-19 NOTE — Evaluation (Addendum)
 Physical Therapy Evaluation Patient Details Name: Michele Brewer MRN: [id number] DOB: 1951/04/07 Today's Date: 08/19/2024  History of Present Illness  presented to ER secondary to fall in home environment; admitted for management of syncope, possible UTI.  All imaging (tib/fib, L hip, L humerus, C-spine, CTH) negative for acute injury.  PMH significant for parkinson's disease, recent R THR (posterior THPs, 06/17/24)  Clinical Impression  Patient resting in bed upon arrival to room; alert and oriented to basic information, follows commands and agreeable to participation with session.  Endorses generalized soreness throughout bilat LEs, L buttocks (FACES 4/10); improved with repositioning throughout session. Bilat UE/LE generally weak and deconditioned, but grossly functional for basic transfers and gait.  Noted difficulty with active initiation of all functional activities; requiring act assist from therapist to initiate/complete all functional movement patterns.  Moderate tremors to bilat UEs noted throughout session; does appear to diminish slightly with intention. Currently requiring mod/max assist for bed mobility; mod assist for sit/stand, standing balance and basic transfers with RW.  Demonstrates forward flexed posture with very short, shuffling steps; limited balance reactions, limited activity tolerance evident. Does require RW and +1 to safely complete. Denies dizziness/lightheadedness with movement transition.   Generally bradykinetic in all movement, requiring increased time/effort to complete all functional tasks.  Unsafe to complete without RW and +1 assist at all times. Would benefit from skilled PT to address above deficits and promote optimal return to PLOF.; recommend post-acute PT follow up as indicated by interdisciplinary care team.      Orthostatic VS for the past 24 hrs (Last 3 readings):  BP- Lying Pulse- Lying BP- Sitting Pulse- Sitting BP- Standing at 0 minutes Pulse- Standing  at 0 minutes BP- Standing at 3 minutes Pulse- Standing at 3 minutes  08/19/24 1446 123/49 83 113/63 76 93/72 94 99/81 77       If plan is discharge home, recommend the following: A lot of help with walking and/or transfers;A lot of help with bathing/dressing/bathroom   Can travel by private vehicle   Yes    Equipment Recommendations    Recommendations for Other Services       Functional Status Assessment       Precautions / Restrictions Precautions Precautions: Fall;Posterior Hip Restrictions Weight Bearing Restrictions Per Provider Order: No      Mobility  Bed Mobility Overal bed mobility: Needs Assistance Bed Mobility: Supine to Sit     Supine to sit: Mod assist, Max assist          Transfers Overall transfer level: Needs assistance Equipment used: Rolling walker (2 wheels) Transfers: Sit to/from Stand, Bed to chair/wheelchair/BSC Sit to Stand: Min assist, Mod assist Stand pivot transfers: Min assist, Mod assist         General transfer comment: forward flexed posture with very short, shuffling steps; limited balance reactions, limited activity tolerance evident.  Does require RW and +1 to safely complete.  Denies dizziness/lightheadedness with movement transition    Ambulation/Gait               General Gait Details: deferred due to generalized fatigue  Stairs            Wheelchair Mobility     Tilt Bed    Modified Rankin (Stroke Patients Only)       Balance Overall balance assessment: Needs assistance Sitting-balance support: No upper extremity supported, Feet supported Sitting balance-Leahy Scale: Fair Sitting balance - Comments: lists L with fatigue, divded attention with functional tasks, min assist to  correct   Standing balance support: Bilateral upper extremity supported Standing balance-Leahy Scale: Poor Standing balance comment: +1, RW to complete                             Pertinent Vitals/Pain Pain  Assessment Pain Assessment: Faces Faces Pain Scale: Hurts little more Pain Location: bilat LEs, L buttocks Pain Descriptors / Indicators: Sore Pain Intervention(s): Monitored during session, Limited activity within patient's tolerance, Repositioned    Home Living Family/patient expects to be discharged to:: Private residence Living Arrangements: Other relatives (Sister and mother)   Type of Home: House Home Access: Level entry       Home Layout: One level Home Equipment: Cane - single Librarian, Academic (2 wheels)      Prior Function Prior Level of Function : Independent/Modified Independent;Driving             Mobility Comments: At baseline, ambulatory with [facility]; transitioned to RW since recent R THR.  Actively participating with HHPT/OT prior to admission       Extremity/Trunk Assessment   Upper Extremity Assessment Upper Extremity Assessment: Generalized weakness;Right hand dominant (grossly at least 4-/5 throughout; resting tremors bilat UEs (do diminish slightly with intention))    Lower Extremity Assessment Lower Extremity Assessment: Generalized weakness (grossly at least 3-/5 throughout R LE, 4-/5 throughout L LE; difficulty with movement initiation, requiring act assist from therapist to initiate)       Communication   Communication Communication: No apparent difficulties Factors Affecting Communication: Reduced clarity of speech    Cognition Arousal: Alert Behavior During Therapy: WFL for tasks assessed/performed   PT - Cognitive impairments: No apparent impairments                         Following commands: Intact       Cueing Cueing Techniques: Verbal cues     General Comments      Exercises Other Exercises Other Exercises: Unsupported sitting edge of bed, completed oral care with min assist at elevated bedside table.  Effectively utilizes bilat UEs to manipulate containers, tremor slightly diminishing with intention.  Generally  bradykinetic in all movement, requiring increased time/effort to complete all functional tasks.   Assessment/Plan    PT Assessment Patient needs continued PT services  PT Problem List Decreased range of motion;Decreased activity tolerance;Decreased balance;Decreased knowledge of precautions;Decreased mobility;Decreased strength;Decreased coordination;Decreased knowledge of use of DME;Decreased safety awareness       PT Treatment Interventions DME instruction;Gait training;Functional mobility training;Therapeutic activities;Therapeutic exercise;Patient/family education;Balance training;Neuromuscular re-education    PT Goals (Current goals can be found in the Care Plan section)  Acute Rehab PT Goals Patient Stated Goal: to get better PT Goal Formulation: With patient Time For Goal Achievement: 09/02/24 Potential to Achieve Goals: Good    Frequency Min 2X/week     Co-evaluation               AM-PAC PT 6 Clicks Mobility  Outcome Measure Help needed turning from your back to your side while in a flat bed without using bedrails?: A Lot Help needed moving from lying on your back to sitting on the side of a flat bed without using bedrails?: A Lot Help needed moving to and from a bed to a chair (including a wheelchair)?: A Lot Help needed standing up from a chair using your arms (e.g., wheelchair or bedside chair)?: A Lot Help needed to walk in hospital room?:  A Lot Help needed climbing 3-5 steps with a railing? : A Lot 6 Click Score: 12    End of Session Equipment Utilized During Treatment: Gait belt Activity Tolerance: Patient tolerated treatment well Patient left: in chair;with call bell/phone within reach;with chair alarm set Nurse Communication: Mobility status PT Visit Diagnosis: Muscle weakness (generalized) (M62.81);Difficulty in walking, not elsewhere classified (R26.2)    Time: 8866-8783 PT Time Calculation (min) (ACUTE ONLY): 43 min   Charges:   PT  Evaluation $PT Eval Moderate Complexity: 1 Mod PT Treatments $Therapeutic Activity: 8-22 mins PT General Charges $$ ACUTE PT VISIT: 1 Visit        Jaymar Loeber H. Delores, PT, DPT, NCS 08/19/24, 2:58 PM [phone]

## 2024-08-19 NOTE — Evaluation (Signed)
 Occupational Therapy Evaluation Patient Details Name: Michele Brewer MRN: [id number] DOB: April 05, 1951 Today's Date: 08/19/2024   History of Present Illness   presented to ER secondary to fall in home environment; admitted for management of syncope, possible UTI.  All imaging (tib/fib, L hip, L humerus, C-spine, CTH) negative for acute injury.  PMH significant for parkinson's disease, recent R THR (posterior THPs, 06/17/24)     Clinical Impressions Patient was seen for OT evaluation this date. Prior to hospital admission, patient was receiving [facility] services s/p fall which resulted in R THR, she spent 3 weeks in rehab before discharging home with home health. She lives with her mother and sister (sister currently in SNF). Prior to [facility], patient was independent with ADLs/IADLs. Patient reports not having PD meds in 3 days which is limiting her mobility; she is performing functional mobility with noteable bradykinesia and kyphotic posture; she is tremulous in UE but reports she has been able to feed herself. OT instructed patient on prxoimal stabilization during grooming/feeding tasks to improve distal coordination, patient states understanding. Patient performed bed mobility with max A for BLE; SPT from [facility] to [facility] with min-mod A using r/w Patient presents with deficits in gross strength/coordination, activity tolerance and balance, affecting safe and optimal ADL completion. Patient is currently requiring max A for BLE ADLs due to hip precautions .  Paient would benefit from skilled OT services to address noted impairments and functional limitations (see below for any additional details) in order to maximize safety and independence while minimizing future risk of falls, injury, and readmission. Anticipate the need for follow up OT services upon acute hospital DC.      If plan is discharge home, recommend the following:   A little help with bathing/dressing/bathroom;A little help with walking and/or  transfers;Assistance with cooking/housework;Assistance with feeding;Assist for transportation     Functional Status Assessment   Patient has had a recent decline in their functional status and demonstrates the ability to make significant improvements in function in a reasonable and predictable amount of time.     Equipment Recommendations   None recommended by OT     Recommendations for Other Services         Precautions/Restrictions   Precautions Precautions: Fall;Posterior Hip Recall of Precautions/Restrictions: Intact Restrictions Weight Bearing Restrictions Per Provider Order: No     Mobility Bed Mobility Overal bed mobility: Needs Assistance Bed Mobility: Sit to Supine       Sit to supine: Max assist, HOB elevated, Used rails   General bed mobility comments: A to lift BLE into bed; mod A to reposition trunk; max a x 2 to move up in bed    Transfers Overall transfer level: Needs assistance Equipment used: Rolling walker (2 wheels) Transfers: Bed to chair/wheelchair/BSC Sit to Stand: Min assist, Mod assist     Step pivot transfers: Min assist, Mod assist, From elevated surface     General transfer comment: forward flexed posture, low quality steps, difficulty weight shifting to initiate step      Balance Overall balance assessment: Needs assistance Sitting-balance support: No upper extremity supported, Feet supported Sitting balance-Leahy Scale: Fair   Postural control: Right lateral lean Standing balance support: Bilateral upper extremity supported Standing balance-Leahy Scale: Poor Standing balance comment: heavy reliance on rw                           ADL either performed or assessed with clinical judgement   ADL Overall  ADL's : Needs assistance/impaired Eating/Feeding: Set up   Grooming: Wash/dry hands;Sitting       Lower Body Bathing: Maximal assistance;Sit to/from stand;Sitting/lateral leans   Upper Body Dressing :  Minimal assistance;Sitting   Lower Body Dressing: Maximal assistance;Sit to/from stand;Adhering to hip precautions   Toilet Transfer: Minimal assistance;Moderate assistance;Rolling walker (2 wheels);BSC/3in1   Toileting- Clothing Manipulation and Hygiene: Minimal assistance;Sit to/from stand;Adhering to hip precautions       Functional mobility during ADLs: Minimal assistance;Rolling walker (2 wheels) General ADL Comments: patient demonstrates bradykinesia, requiring increased time to initiate and complete functional movements during ADLs     Vision         Perception         Praxis         Pertinent Vitals/Pain Pain Assessment Pain Assessment: 0-10 Pain Score: 3  Pain Location: R hip Pain Descriptors / Indicators: Sore Pain Intervention(s): Monitored during session     Extremity/Trunk Assessment Upper Extremity Assessment Upper Extremity Assessment: Generalized weakness   Lower Extremity Assessment Lower Extremity Assessment: Defer to PT evaluation   Cervical / Trunk Assessment Cervical / Trunk Assessment: Kyphotic   Communication Communication Communication: Impaired Factors Affecting Communication: Reduced clarity of speech   Cognition Arousal: Alert Behavior During Therapy: WFL for tasks assessed/performed Cognition: No apparent impairments                               Following commands: Intact       Cueing  General Comments   Cueing Techniques: Verbal cues      Exercises     Shoulder Instructions      Home Living Family/patient expects to be discharged to:: Private residence Living Arrangements: Other relatives (sister & mother) Available Help at Discharge: Family;Available 24 hours/day Type of Home: House Home Access: Level entry     Home Layout: One level     Bathroom Shower/Tub: Producer, Television/film/video: Handicapped height Bathroom Accessibility: Yes How Accessible: Accessible via walker Home Equipment:  Cane - single point;Rolling Walker (2 wheels);Shower seat;Grab bars - toilet;Grab bars - tub/shower          Prior Functioning/Environment Prior Level of Function : Independent/Modified Independent;Driving             Mobility Comments: At baseline, ambulatory with [facility]; transitioned to [facility] since recent R THR.  Actively participating with HHPT/OT prior to admission ADLs Comments: MOD I-I for ADL/IADL    OT Problem List: Decreased strength;Decreased activity tolerance;Impaired balance (sitting and/or standing);Decreased coordination;Decreased knowledge of use of DME or AE   OT Treatment/Interventions: Self-care/ADL training;Therapeutic exercise;Energy conservation;DME and/or AE instruction;Therapeutic activities;Patient/family education;Balance training      OT Goals(Current goals can be found in the care plan section)   Acute Rehab OT Goals Patient Stated Goal: to go home OT Goal Formulation: With patient Time For Goal Achievement: 09/02/24 Potential to Achieve Goals: Good ADL Goals Pt Will Perform Grooming: with supervision;sitting;standing Pt Will Perform Lower Body Bathing: with supervision;sit to/from stand;with adaptive equipment Pt Will Perform Lower Body Dressing: with supervision;with adaptive equipment;sit to/from stand Pt Will Transfer to Toilet: with supervision;ambulating;grab bars;regular height toilet   OT Frequency:  Min 2X/week    Co-evaluation              AM-PAC OT 6 Clicks Daily Activity     Outcome Measure Help from another person eating meals?: A Little Help from another person taking care of  personal grooming?: A Little Help from another person toileting, which includes using toliet, bedpan, or urinal?: A Little Help from another person bathing (including washing, rinsing, drying)?: A Little Help from another person to put on and taking off regular upper body clothing?: A Little Help from another person to put on and taking off regular lower  body clothing?: A Lot 6 Click Score: 17   End of Session Equipment Utilized During Treatment: Gait belt;Rolling walker (2 wheels) Nurse Communication: Mobility status  Activity Tolerance: Patient tolerated treatment well Patient left: in bed;with call bell/phone within reach;with bed alarm set;with nursing/sitter in room  OT Visit Diagnosis: Unsteadiness on feet (R26.81);Other abnormalities of gait and mobility (R26.89);Muscle weakness (generalized) (M62.81);History of falling (Z91.81);Feeding difficulties (R63.3)                Time: 8556-8490 OT Time Calculation (min): 26 min Charges:  OT General Charges $OT Visit: 1 Visit OT Evaluation $OT Eval Low Complexity: 1 Low OT Treatments $Self Care/Home Management : 8-22 mins  Rogers Clause, OT/L [facility], 08/19/2024

## 2024-08-19 NOTE — Plan of Care (Signed)

## 2024-08-19 NOTE — Assessment & Plan Note (Signed)
 Hemoglobin seems stable.  Multiple deficiencies include iron , B12 and folate noted on last hospitalization. - Continue with supplement

## 2024-08-20 DIAGNOSIS — R55 Syncope and collapse: Secondary | ICD-10-CM | POA: Diagnosis not present

## 2024-08-20 DIAGNOSIS — G20A2 Parkinson's disease without dyskinesia, with fluctuations: Secondary | ICD-10-CM

## 2024-08-20 LAB — COPPER, SERUM: Copper: 107 ug/dL (ref 80–158)

## 2024-08-20 MED ORDER — FUROSEMIDE 10 MG/ML IJ SOLN
20.0000 mg | Freq: Once | INTRAMUSCULAR | Status: AC
  Administered 2024-08-20: 20 mg via INTRAVENOUS
  Filled 2024-08-20: qty 2

## 2024-08-20 MED ORDER — FUROSEMIDE 10 MG/ML IJ SOLN
20.0000 mg | Freq: Once | INTRAMUSCULAR | Status: AC
  Administered 2024-08-21: 20 mg via INTRAVENOUS
  Filled 2024-08-20: qty 2

## 2024-08-20 NOTE — Plan of Care (Signed)

## 2024-08-20 NOTE — Progress Notes (Addendum)
 PROGRESS NOTE Michele Brewer    DOB: Feb 20, 1951, 73 y.o.  FMW:[id number]    Code Status: Do not attempt resuscitation (DNR) PRE-ARREST INTERVENTIONS DESIRED   DOA: 08/18/2024   LOS: 1  Brief hospital course  Michele Brewer is a 73 y.o. female with a PMH significant for Parkinson's disease, anemia.  They presented from home  to the ED on 08/18/2024 with fall with possible syncope. No significant traumatic injuries sustained.   In the ED, it was found that they had mild rhabdomyolysis and UTI.  Significant findings included echo showing valvular disease with MVR, TVR, AVR, and mildly dilated RA. No systolic or diastolic abnormalities.  They were initially treated with IVF hydration.   Patient was admitted to medicine service for further workup and management of fall as outlined in detail below.  08/20/24 -clinically stable.   Assessment & Plan  Principal Problem:   Syncope Active Problems:   Fall   Ambulatory dysfunction   Rhabdomyolysis   UTI (urinary tract infection)   Malnutrition of moderate degree   Parkinson disease (HCC)   Chronic anemia  Fall- likely multifactorial including parkinson's and new leg edema, UTI causing weakness. Cannot rule out syncope. No abnormalities on telemetry. Orthostatics negative on admission - continue telemetry.  - echo showing valvular disease as listed above.   - cardiology consulted.  - treat the treatable  -PT and OT evaluation- recommending SNF. TOC consulted - Continue to monitor   Edema- gravity dependent and pitting. Possibly due to time on floor was on her left side that she has worse on L side of her body. The swelling pre-dates her fall. Systolic and diastolic function appear normal. Valvular disease maybe contributing - stopped fluids and gave lasix x1. She is diuretic naiive.  - cardiology consulted to evaluate   Rhabdomyolysis CK at 1713, likely secondary to fall. - s/p IVF due to swelling  UTI- suggested on UA. Cultures  pending -Continue with ceftriaxone  -Follow-up urine cultures   Malnutrition of moderate degree Estimated body mass index is 24.03 kg/m as calculated from the following:   Height as of this encounter: 5' 4 (1.626 m).   Weight as of this encounter: 63.5 kg.  - Dietitian was consulted -Multiple nutritional labs ordered -Continue with supplemental   Parkinson disease (HCC) - continue Sinemet    Chronic anemia Hemoglobin seems stable.  Multiple deficiencies include iron , B12 and folate noted on last hospitalization. - Continue with supplement  Body mass index is 24.03 kg/m.  VTE ppx: enoxaparin  (LOVENOX ) injection 40 mg Start: 08/18/24 2200  Diet:     Diet   Diet regular Room service appropriate? Yes; Fluid consistency: Thin   Consultants: Cardiology   Subjective 08/20/24    Pt reports feeling improved. Agreeable to snf if it is an option. We discuss her swelling.    Objective  Blood pressure 122/66, pulse 82, temperature 98.1 F (36.7 C), resp. rate 16, height 5' 4 (1.626 m), weight 63.5 kg, SpO2 100%.  Intake/Output Summary (Last 24 hours) at 08/20/2024 0819 Last data filed at 08/20/2024 0300 Gross per 24 hour  Intake 1490.64 ml  Output --  Net 1490.64 ml   Filed Weights   08/18/24 1110  Weight: 63.5 kg    Physical Exam:  General: awake, alert, NAD HEENT: atraumatic, clear conjunctiva, anicteric sclera, MMM, hearing grossly normal Respiratory: normal respiratory effort. Cardiovascular: extremities well perfused, quick capillary refill, regular rhythm. Systolic murmur present. Pitting edema to left arm and bilateral lower extremities  Nervous: A&O x3.  no gross focal neurologic deficits, normal speech Extremities: moves all equally, normal tone Skin: dry, intact, normal temperature, normal color. No rashes, lesions or ulcers on exposed skin Psychiatry: normal mood, congruent affect  Labs   I have personally reviewed the following labs and imaging  studies CBC    Component Value Date/Time   WBC 5.0 08/19/2024 0357   RBC 3.00 (L) 08/19/2024 0357   HGB 9.2 (L) 08/19/2024 0357   HCT 28.3 (L) 08/19/2024 0357   PLT 180 08/19/2024 0357   MCV 94.3 08/19/2024 0357   MCH 30.7 08/19/2024 0357   MCHC 32.5 08/19/2024 0357   RDW 14.1 08/19/2024 0357   LYMPHSABS 0.9 08/02/2024 0920   MONOABS 0.6 08/02/2024 0920   EOSABS 0.1 08/02/2024 0920   BASOSABS 0.0 08/02/2024 0920      Latest Ref Rng & Units 08/19/2024    3:57 AM 08/18/2024    2:18 PM 08/02/2024    7:29 AM  BMP  Glucose 70 - 99 mg/dL 82  96  85   BUN 8 - 23 mg/dL 21  22  35   Creatinine 0.44 - 1.00 mg/dL 9.30  9.26  9.13   Sodium 135 - 145 mmol/L 142  143  140   Potassium 3.5 - 5.1 mmol/L 3.8  4.2  3.5   Chloride 98 - 111 mmol/L 107  106  101   CO2 22 - 32 mmol/L 26  29  27    Calcium 8.9 - 10.3 mg/dL 9.2  9.8  9.7    ECHOCARDIOGRAM COMPLETE Result Date: 08/19/2024    ECHOCARDIOGRAM REPORT   Patient Name:   Michele Brewer Date of Exam: 08/19/2024 Medical Rec #:  [id number]     Height:       64.0 in Accession #:    [id number]    Weight:       140.0 lb Date of Birth:  Feb 27, 1951     BSA:          1.681 m Patient Age:    73 years      BP:           111/54 mmHg Patient Gender: F             HR:           91 bpm. Exam Location:  [facility] Procedure: 2D Echo, Cardiac Doppler and Color Doppler (Both Spectral and Color            Flow Doppler were utilized during procedure). Indications:     Syncope R55  History:         Patient has no prior history of Echocardiogram examinations.                  Signs/Symptoms:Syncope.  Sonographer:     Ashley McNeely-Sloane Referring Phys:  [id number] DAVED BROCKS [facility] Diagnosing Phys: Lonni Hanson MD IMPRESSIONS  1. Left ventricular ejection fraction, by estimation, is 60 to 65%. The left ventricle has normal function. The left ventricle has no regional wall motion abnormalities. Left ventricular diastolic parameters were normal.  2. Right ventricular systolic  function is normal. The right ventricular size is normal. There is mildly elevated pulmonary artery systolic pressure.  3. Right atrial size was mildly dilated.  4. The mitral valve is degenerative. Moderate mitral valve regurgitation. No evidence of mitral stenosis.  5. Tricuspid valve regurgitation is moderate.  6. The aortic valve is tricuspid. There is mild thickening of the aortic valve. Aortic valve regurgitation is mild  to moderate. No aortic stenosis is present.  7. The inferior vena cava is dilated in size with <50% respiratory variability, suggesting right atrial pressure of 15 mmHg. FINDINGS  Left Ventricle: Left ventricular ejection fraction, by estimation, is 60 to 65%. The left ventricle has normal function. The left ventricle has no regional wall motion abnormalities. Global longitudinal strain performed but not reported based on interpreter judgement due to suboptimal tracking. The left ventricular internal cavity size was normal in size. There is no left ventricular hypertrophy. Left ventricular diastolic parameters were normal. Right Ventricle: The right ventricular size is normal. No increase in right ventricular wall thickness. Right ventricular systolic function is normal. There is mildly elevated pulmonary artery systolic pressure. The tricuspid regurgitant velocity is 2.37  m/s, and with an assumed right atrial pressure of 15 mmHg, the estimated right ventricular systolic pressure is 37.5 mmHg. Left Atrium: Left atrial size was normal in size. Right Atrium: Right atrial size was mildly dilated. Pericardium: There is no evidence of pericardial effusion. Mitral Valve: The mitral valve is degenerative in appearance. There is mild thickening of the mitral valve leaflet(s). There is mild calcification of the mitral valve leaflet(s). Moderate mitral valve regurgitation. No evidence of mitral valve stenosis. MV peak gradient, 6.8 mmHg. The mean mitral valve gradient is 3.0 mmHg. Tricuspid Valve: The  tricuspid valve is normal in structure. Tricuspid valve regurgitation is moderate. Aortic Valve: The aortic valve is tricuspid. There is mild thickening of the aortic valve. Aortic valve regurgitation is mild to moderate. Aortic regurgitation PHT measures 493 msec. No aortic stenosis is present. Aortic valve mean gradient measures 4.0 mmHg. Aortic valve peak gradient measures 7.5 mmHg. Aortic valve area, by VTI measures 2.98 cm. Pulmonic Valve: The pulmonic valve was normal in structure. Pulmonic valve regurgitation is mild. Aorta: The aortic root and ascending aorta are structurally normal, with no evidence of dilitation. Pulmonary Artery: The pulmonary artery is of normal size. Venous: The inferior vena cava is dilated in size with less than 50% respiratory variability, suggesting right atrial pressure of 15 mmHg. IAS/Shunts: The interatrial septum was not well visualized. Additional Comments: 3D was performed not requiring image post processing on an independent workstation and was indeterminate.  LEFT VENTRICLE PLAX 2D LVIDd:         4.20 cm     Diastology LVIDs:         2.40 cm     LV e' medial:    9.32 cm/s LV PW:         0.80 cm     LV E/e' medial:  9.6 LV IVS:        0.90 cm     LV e' lateral:   11.30 cm/s LVOT diam:     2.10 cm     LV E/e' lateral: 7.9 LV SV:         83 LV SV Index:   49 LVOT Area:     3.46 cm LV IVRT:       77 msec  LV Volumes (MOD) LV vol d, MOD A2C: 81.2 ml LV vol d, MOD A4C: 90.6 ml LV vol s, MOD A2C: 15.3 ml LV vol s, MOD A4C: 25.6 ml LV SV MOD A2C:     65.9 ml LV SV MOD A4C:     90.6 ml LV SV MOD BP:      67.9 ml RIGHT VENTRICLE             IVC RV Basal diam:  3.30 cm     IVC diam: 2.40 cm RV Mid diam:    3.10 cm RV S prime:     19.70 cm/s  PULMONARY VEINS TAPSE (M-mode): 3.1 cm      A Reversal Duration: 173.00 msec                             A Reversal Velocity: 36.70 cm/s                             Diastolic Velocity:  38.20 cm/s                             S/D Velocity:         1.90                             Systolic Velocity:   72.40 cm/s LEFT ATRIUM             Index        RIGHT ATRIUM           Index LA diam:        3.90 cm 2.32 cm/m   RA Area:     19.50 cm LA Vol (A2C):   52.0 ml 30.93 ml/m  RA Volume:   59.40 ml  35.33 ml/m LA Vol (A4C):   34.5 ml 20.52 ml/m LA Biplane Vol: 43.4 ml 25.81 ml/m  AORTIC VALVE                    PULMONIC VALVE AV Area (Vmax):    3.39 cm     PV Vmax:          0.98 m/s AV Area (Vmean):   3.09 cm     PV Vmean:         70.600 cm/s AV Area (VTI):     2.98 cm     PV VTI:           0.198 m AV Vmax:           137.00 cm/s  PV Peak grad:     3.8 mmHg AV Vmean:          94.500 cm/s  PV Mean grad:     2.0 mmHg AV VTI:            0.278 m      PR End Diast Vel: 3.58 msec AV Peak Grad:      7.5 mmHg     RVOT Peak grad:   2 mmHg AV Mean Grad:      4.0 mmHg LVOT Vmax:         134.00 cm/s LVOT Vmean:        84.300 cm/s LVOT VTI:          0.239 m LVOT/AV VTI ratio: 0.86 AI PHT:            493 msec  AORTA Ao Root diam: 2.80 cm Ao Asc diam:  2.60 cm MITRAL VALVE                  TRICUSPID VALVE MV Area (PHT): 3.72 cm       TV Peak grad:   30.0 mmHg MV Area VTI:   2.84 cm       TV  Vmax:        2.74 m/s MV Peak grad:  6.8 mmHg       TR Peak grad:   22.5 mmHg MV Mean grad:  3.0 mmHg       TR Mean grad:   17.0 mmHg MV Vmax:       1.30 m/s       TR Vmax:        237.00 cm/s MV Vmean:      78.4 cm/s      TR Vmean:       199.0 cm/s MV Decel Time: 204 msec MR Peak grad:    95.8 mmHg    SHUNTS MR Mean grad:    76.0 mmHg    Systemic VTI:  0.24 m MR Vmax:         489.50 cm/s  Systemic Diam: 2.10 cm MR Vmean:        424.0 cm/s   Pulmonic VTI:  0.180 m MR PISA:         1.90 cm MR PISA Eff ROA: 15 mm MR PISA Radius:  0.55 cm MV E velocity: 89.10 cm/s MV A velocity: 118.00 cm/s MV E/A ratio:  0.76 Lonni End MD Electronically signed by Lonni Hanson MD Signature Date/Time: 08/19/2024/12:16:14 PM    Final    DG Tibia/Fibula Left Result Date: 08/18/2024 CLINICAL DATA:   Fall.  Left-sided pain. EXAM: LEFT TIBIA AND FIBULA - 2 VIEW COMPARISON:  None Available. FINDINGS: No acute fracture involving the left tibia or fibula. Calcaneal spurring. Bilateral soft tissue swelling at the left ankle. Left ankle is located. Left knee is located. Degenerative changes in left knee. IMPRESSION: 1. No acute bone abnormality to the left tibia or fibula. 2. Soft tissue swelling at the left ankle. Electronically Signed   By: Juliene Balder M.D.   On: 08/18/2024 13:41   DG HIP UNILAT WITH PELVIS 2-3 VIEWS LEFT Result Date: 08/18/2024 CLINICAL DATA:  Left-sided pain.  Status post fall. EXAM: DG HIP (WITH OR WITHOUT PELVIS) 2-3V LEFT COMPARISON:  06/17/2024 FINDINGS: Status post right hip arthroplasty. The right femoral stem is not completely imaged. Pelvic bony ring is intact. Left hip is located without acute fracture. IMPRESSION: 1. No acute abnormality to the left hip. 2. Status post right hip arthroplasty. Electronically Signed   By: Juliene Balder M.D.   On: 08/18/2024 13:39   DG Humerus Left Result Date: 08/18/2024 CLINICAL DATA:  Fall.  Left-sided pain. EXAM: LEFT HUMERUS - 2+ VIEW COMPARISON:  None Available. FINDINGS: Left humerus is intact without a fracture. Mild elevation of the distal left clavicle. Degenerative changes at the left [facility] joint. Evidence for old left rib fractures. Limited evaluation of the left elbow. IMPRESSION: 1. No acute bone abnormality to the left humerus. 2. Mild elevation of the distal left clavicle. Findings could be associated with an age-indeterminate AC joint injury. Recommend clinical correlation in this area. Electronically Signed   By: Juliene Balder M.D.   On: 08/18/2024 13:38   CT Head Wo Contrast Result Date: 08/18/2024 EXAM: CT HEAD WITHOUT 08/18/2024 12:00:32 PM TECHNIQUE: CT of the head was performed without the administration of intravenous contrast. Automated exposure control, iterative reconstruction, and/or weight based adjustment of the mA/kV was  utilized to reduce the radiation dose to as low as reasonably achievable. COMPARISON: 06/02/2023 CLINICAL HISTORY: Head trauma, minor (Age >= 65y) FINDINGS: BRAIN AND VENTRICLES: No acute intracranial hemorrhage. No mass effect or midline shift. No extra-axial fluid collection. No evidence  of acute infarct. No hydrocephalus. Cerebral ventricle sizes concordant with degree of cerebral volume loss. ORBITS: No acute abnormality. SINUSES AND MASTOIDS: No acute abnormality. SOFT TISSUES AND SKULL: No acute skull fracture. No acute soft tissue abnormality. IMPRESSION: 1. No acute intracranial abnormality related to head trauma. 2. Cerebral ventricle sizes concordant with degree of cerebral volume loss. Electronically signed by: Evalene Coho MD 08/18/2024 12:13 PM EST RP Workstation: [id number]   CT Cervical Spine Wo Contrast Result Date: 08/18/2024 CLINICAL DATA:  Neck trauma (Age >= 65y) EXAM: CT CERVICAL SPINE WITHOUT CONTRAST TECHNIQUE: Multidetector CT imaging of the cervical spine was performed without intravenous contrast. Multiplanar CT image reconstructions were also generated. RADIATION DOSE REDUCTION: This exam was performed according to the departmental dose-optimization program which includes automated exposure control, adjustment of the mA and/or kV according to patient size and/or use of iterative reconstruction technique. COMPARISON:  MRI cervical spine 07/15/2022. CT cervical spine 02/03/2021. FINDINGS: Alignment: Chronic reversal of the usual cervical lordosis, similar to previous CT. No focal angulation or significant listhesis. Skull base and vertebrae: No evidence of acute cervical spine fracture or traumatic subluxation. Soft tissues and spinal canal: No prevertebral fluid or swelling. No visible canal hematoma. Disc levels: Chronic spondylosis with disc space narrowing, uncinate spurring and facet hypertrophy. Chronic interfacetal ankylosis on the right at C2-3. Chronic foraminal narrowing,  greatest on the right at C4-5 and on the left at C6-7. No large disc herniation identified. Upper chest: Clear lung apices. Other: Asymmetric TMJ degenerative changes on the left. IMPRESSION: 1. No evidence of acute cervical spine fracture, traumatic subluxation or static signs of instability. 2. Chronic cervical spondylosis as described. Electronically Signed   By: Elsie Perone M.D.   On: 08/18/2024 12:12   Disposition Plan & Communication  Patient status: Inpatient  Admitted From: Home Planned disposition location: Skilled nursing facility Anticipated discharge date: 12/5 pending cardiology clearance   Family Communication: none at bedside    Author: Marien LITTIE Piety, DO [facility] 08/20/2024, 8:19 AM   Available by Epic secure chat 7AM-7PM. If 7PM-7AM, please contact night-coverage.  [facility] contact information found on [email or web address].

## 2024-08-20 NOTE — Progress Notes (Signed)
 Occupational Therapy Treatment Patient Details Name: Michele Brewer MRN: [id number] DOB: 02-02-1951 Today's Date: 08/20/2024   History of present illness presented to ER secondary to fall in home environment; admitted for management of syncope, possible UTI.  All imaging (tib/fib, L hip, L humerus, C-spine, CTH) negative for acute injury.  PMH significant for parkinson's disease, recent R THR (posterior THPs, 06/17/24)   OT comments  Pt in chair upon OT arrival and agreeable to OT session.  Pt verbalized moderate pain today throughout L side, but denied R hip pain.  Pt pleased with her ability to ambulate further today in room, reporting that she had only been able to manage pivot transfers at bedside yesterday.  Pt tolerated functional transfers with CGA and functional mobility with RW and CGA-close supv around bed to reach sink.  Pt tolerated standing with close supv to brush teeth before returning to recliner.  Reviewed hip precautions with good recall, and advised on edema reduction strategies for L hand; see below for additional details.  Will continue to work towards goals in [employer].       If plan is discharge home, recommend the following:  A little help with bathing/dressing/bathroom;A little help with walking and/or transfers;Assistance with cooking/housework;Assistance with feeding;Assist for transportation   Equipment Recommendations  None recommended by OT    Recommendations for Other Services      Precautions / Restrictions Precautions Precautions: Fall;Posterior Hip Recall of Precautions/Restrictions: Intact Restrictions Weight Bearing Restrictions Per Provider Order: No       Mobility Bed Mobility               General bed mobility comments: NT as pt was received and left in chair end of OT session Patient Response: Cooperative  Transfers Overall transfer level: Needs assistance Equipment used: Rolling walker (2 wheels) Transfers: Sit to/from Stand Sit to  Stand: Contact guard assist                 Balance Overall balance assessment: Needs assistance Sitting-balance support: No upper extremity supported, Feet supported Sitting balance-Leahy Scale: Good     Standing balance support: Single extremity supported, During functional activity Standing balance-Leahy Scale: Fair Standing balance comment: Able to stand to perform oral care with 1 hand on sink countertop for support and close SBA from OT.                           ADL either performed or assessed with clinical judgement   ADL Overall ADL's : Needs assistance/impaired     Grooming: Oral care;Supervision/safety;Standing                               Functional mobility during ADLs: Contact guard assist;Rolling walker (2 wheels) General ADL Comments: Ambulatory in room with CGA and RW from chair<>sink around foot of bed to complete grooming as noted above.    Extremity/Trunk Assessment Upper Extremity Assessment Upper Extremity Assessment: Generalized weakness   Lower Extremity Assessment Lower Extremity Assessment: Generalized weakness;Defer to PT evaluation   Cervical / Trunk Assessment Cervical / Trunk Assessment: Kyphotic                     Communication Communication Communication: No apparent difficulties   Cognition Arousal: Alert Behavior During Therapy: WFL for tasks assessed/performed Cognition: No apparent impairments  Following commands: Intact        Cueing   Cueing Techniques: Verbal cues  Exercises Other Exercises Other Exercises: Review of hip precautions; pt able to recall 100%.  Educ provided on edema reduction strategies for L hand (mildly edematous, possibly from IV stick)    Shoulder Instructions       General Comments Mild swelling L hand, possiblly from IV stick.  Education provided on wrist and hand AROM and elevation to reduce edema.    Pertinent  Vitals/ Pain       Pain Assessment Pain Assessment: 0-10 Pain Score: 5  Pain Location: L side Pain Descriptors / Indicators: Sore Pain Intervention(s): Monitored during session, Limited activity within patient's tolerance, Repositioned  Home Living                                          Prior Functioning/Environment              Frequency  Min 2X/week        Progress Toward Goals  OT Goals(current goals can now be found in the care plan section)  Progress towards OT goals: Progressing toward goals  Acute Rehab OT Goals Patient Stated Goal: To go home OT Goal Formulation: With patient Time For Goal Achievement: 09/02/24 Potential to Achieve Goals: Good  Plan                       AM-PAC OT 6 Clicks Daily Activity     Outcome Measure   Help from another person eating meals?: A Little Help from another person taking care of personal grooming?: A Little Help from another person toileting, which includes using toliet, bedpan, or urinal?: A Little Help from another person bathing (including washing, rinsing, drying)?: A Little Help from another person to put on and taking off regular upper body clothing?: A Little Help from another person to put on and taking off regular lower body clothing?: A Lot 6 Click Score: 17    End of Session Equipment Utilized During Treatment: Gait belt;Rolling walker (2 wheels)  OT Visit Diagnosis: Unsteadiness on feet (R26.81);Other abnormalities of gait and mobility (R26.89);Muscle weakness (generalized) (M62.81);History of falling (Z91.81);Feeding difficulties (R63.3)   Activity Tolerance Patient tolerated treatment well   Patient Left with call bell/phone within reach;in chair;with family/visitor present   Nurse Communication          Time: 8983-8954 OT Time Calculation (min): 29 min  Charges: OT General Charges $OT Visit: 1 Visit OT Treatments $Self Care/Home Management : 23-37 mins  [name], MS, OTR/L   [name] 08/20/2024, 1:27 PM

## 2024-08-20 NOTE — TOC Initial Note (Signed)
 Transition of Care [facility]) - Initial/Assessment Note    Patient Details  Name: Michele Brewer MRN: [id number] Date of Birth: [birth year]  Transition of Care [facility]) CM/SW Contact:    Dalia GORMAN Fuse, RN Phone Number: 08/20/2024, 12:22 PM  Clinical Narrative:                 Therapy recs are for SNF. The patient was at [facility] before and would like to go back there. The patient is not sure how SNF days she used. TOC sent message to Tammy at [facility] and is awaiting f/u. Pt reports living in a mobile home with ramp  entry in the front and rear. Pt lives with her sister Barnie .  Pt uses Psychologist, forensic in [place] & mail  order pharm with [employer] .   PCP is listed as Dr. Vishwanath Hande at the [place] [facility].  FL2 sent out, TOC will continue to follow.   Expected Discharge Plan: Skilled Nursing Facility Barriers to Discharge: Continued Medical Work up   Patient Goals and CMS Choice            Expected Discharge Plan and Services   Discharge Planning Services: CM Consult   Living arrangements for the past 2 months: Single Family Home                                      Prior Living Arrangements/Services Living arrangements for the past 2 months: Single Family Home Lives with:: Self              Current home services: DME    Activities of Daily Living   ADL Screening (condition at time of admission) Independently performs ADLs?: Yes (appropriate for developmental age) Is the patient deaf or have difficulty hearing?: No Does the patient have difficulty seeing, even when wearing glasses/contacts?: No Does the patient have difficulty concentrating, remembering, or making decisions?: No  Permission Sought/Granted                  Emotional Assessment              Admission diagnosis:  Syncope [R55] Weakness [R53.1] Difficulty walking [R26.2] Left hip pain [M25.552] Left arm pain [M79.602] Acute cystitis with hematuria [N30.01] Fall, initial encounter  [W19.XXXA] Non-traumatic rhabdomyolysis [M62.82] Patient Active Problem List   Diagnosis Date Noted   Malnutrition of moderate degree 08/19/2024   UTI (urinary tract infection) 08/19/2024   Chronic anemia 08/19/2024   Syncope 08/18/2024   Rupture of ligament of right wrist 06/18/2024   Closed right hip fracture (HCC) 06/17/2024   COVID-19 virus infection 06/02/2023   Constipation 07/18/2022   Vitamin B12 deficiency 07/16/2022   Vitamin D  deficiency 07/16/2022   Folate deficiency 07/16/2022   Right leg weakness 07/15/2022   Neuropathy 07/15/2022   Ambulatory dysfunction 07/14/2022   Hypomagnesemia 05/28/2022   Parkinson disease (HCC) 05/25/2022   Depression 05/25/2022   Frequent falls 02/09/2021   Multiple fractures of ribs, left side, subsequent encounter for fracture with routine healing 02/08/2021   Polyneuropathy, unspecified 02/08/2021   Rhabdomyolysis 02/04/2021   Fall 02/03/2021   Osteopenia 04/07/2020   Major depressive disorder, recurrent, 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   PCP:  [name], MD Pharmacy:   [facility] [address], [place] - [address] [place] [address] Phone: [phone] Fax: [phone]  [id number] - [place] ([place]), [place] - [address] [place]) [place] [address] Phone: [phone] Fax: [phone]     Social Drivers of Health (SDOH) Social History: SDOH Screenings   Food Insecurity: No Food Insecurity (08/18/2024)  Housing: Low Risk  (08/18/2024)  Transportation Needs: No Transportation Needs (08/18/2024)  Utilities: Not At Risk (08/18/2024)  Financial Resource Strain: Low Risk  (06/12/2024)   Received from [facility]  Social Connections: Moderately Isolated (08/18/2024)  Tobacco Use: Low Risk  (08/18/2024)   SDOH Interventions:     Readmission Risk Interventions     No data to display

## 2024-08-20 NOTE — Progress Notes (Signed)
 Physical Therapy Treatment Patient Details Name: Michele Brewer MRN: [id number] DOB: 22-Jan-1951 Today's Date: 08/20/2024   History of Present Illness presented to ER secondary to fall in home environment; admitted for management of syncope, possible UTI.  All imaging (tib/fib, L hip, L humerus, C-spine, CTH) negative for acute injury.  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 She is A and O + agreeable to session. Motivated to participate and endorsing feeling well overall. BP in seated position 117/64(80). No symptoms. After static standing for ~ 2 minutes 105/66(78) still asymptomatic overall. Progressed session to ambulation with sue of RW. She tolerated ambulation ~ 25 ft however has toe walking, shuffling, narrow gait presentation. Min assist at times for balance and safety. Pt will benefit from continued skilled PT at [facility] to maximize independence and safety with all ADLs. DC recs remain appropriate.     If plan is discharge home, recommend the following: A lot of help with walking and/or transfers;A lot of help with bathing/dressing/bathroom;Assist for transportation;Help with stairs or ramp for entrance;Assistance with cooking/housework     Equipment Recommendations  Other (comment) (Defer to next level of care)       Precautions / Restrictions Precautions Precautions: Fall;Posterior Hip Precaution Booklet Issued: Yes (comment) Recall of Precautions/Restrictions: Intact Restrictions Weight Bearing Restrictions Per Provider Order: No     Mobility  Bed Mobility Overal bed mobility: Needs Assistance Bed Mobility: Sit to Supine  Sit to supine: Min assist General bed mobility comments: MIn assist to progress BLEs into bed and then to reposition to [facility]    Transfers Overall transfer level: Needs assistance Equipment used: Rolling walker (2 wheels) Transfers: Sit to/from Stand Sit to Stand:  Contact guard assist, Min assist  General transfer comment: Pt was able to stand from recliner to RW with CGA + vcs for improved technique + fwd wt shift.    Ambulation/Gait Ambulation/Gait assistance: Contact guard assist, Min assist Gait Distance (Feet): 25 Feet Assistive device: Rolling walker (2 wheels) Gait Pattern/deviations: Step-to pattern, Narrow base of support, Shuffle Gait velocity: decreased  General Gait Details: Pt was able to ambulate ~ 25 ft with shuffling, narrow, toe walking kinematicvs. with vcs for wider BOS and attempting heel strike, gait did improve    Balance Overall balance assessment: Needs assistance Sitting-balance support: No upper extremity supported, Feet supported Sitting balance-Leahy Scale: Good     Standing balance support: Bilateral upper extremity supported, During functional activity, Reliant on assistive device for balance Standing balance-Leahy Scale: Fair Standing balance comment: Pt is at high risk of falls due to parkinsonian gait kinematics and hx of previous falls     Communication Communication Communication: No apparent difficulties  Cognition Arousal: Alert Behavior During Therapy: WFL for tasks assessed/performed   PT - Cognitive impairments: No apparent impairments      PT - Cognition Comments: Pt A and O x 4. slow processing Following commands: Intact      Cueing Cueing Techniques: Verbal cues  Exercises Other Exercises Other Exercises: Pt was aware of hip precautions and did adhere to them throughout        Pertinent Vitals/Pain Pain Assessment Pain Assessment: 0-10 Pain Score: 5  Pain Location: L side Pain Descriptors / Indicators: Sore Pain Intervention(s): Limited activity within patient's tolerance, Monitored during session, Premedicated before session, Repositioned     PT Goals (current goals can now be found in the care plan section)  Acute Rehab PT Goals Patient Stated Goal: to get better Progress towards  PT goals: Progressing toward goals    Frequency    Min 2X/week       AM-PAC PT 6 Clicks Mobility   Outcome Measure  Help needed turning from your back to your side while in a flat bed without using bedrails?: A Little Help needed moving from lying on your back to sitting on the side of a flat bed without using bedrails?: A Lot Help needed moving to and from a bed to a chair (including a wheelchair)?: A Lot Help needed standing up from a chair using your arms (e.g., wheelchair or bedside chair)?: A Little Help needed to walk in hospital room?: A Little Help needed climbing 3-5 steps with a railing? : A Lot 6 Click Score: 15    End of Session   Activity Tolerance: Patient tolerated treatment well;Patient limited by fatigue Patient left: in bed;with call bell/phone within reach;with bed alarm set Nurse Communication: Mobility status PT Visit Diagnosis: Muscle weakness (generalized) (M62.81);Difficulty in walking, not elsewhere classified (R26.2)     Time: 8566-8554 PT Time Calculation (min) (ACUTE ONLY): 12 min  Charges:    $Gait Training: 8-22 mins PT General Charges $$ ACUTE PT VISIT: 1 Visit                    Rankin Essex PTA 08/20/24, 5:26 PM

## 2024-08-20 NOTE — [place] FL2 (Signed)
 [place]  MEDICAID FL2 LEVEL OF CARE FORM     IDENTIFICATION  Patient Name: Michele Brewer Birthdate: Jan 21, 1951 Sex: female Admission Date (Current Location): 08/18/2024  [place] and [name] Number:  Chiropodist and Address:  [facility], [address] [place], [place] [address]      Provider Number: [id number]  Attending Physician Name and Address:  Lenon Marien CROME, MD  Relative Name and Phone Number:  Joshua Faden Pricilla)  [phone]    Current Level of Care: SNF Recommended Level of Care: Skilled Nursing Facility Prior Approval Number:    Date Approved/Denied:   PASRR Number: [id number]  Discharge Plan: SNF    Current Diagnoses: Patient Active Problem List   Diagnosis Date Noted   Malnutrition of moderate degree 08/19/2024   UTI (urinary tract infection) 08/19/2024   Chronic anemia 08/19/2024   Syncope 08/18/2024   Rupture of ligament of right wrist 06/18/2024   Closed right hip fracture (HCC) 06/17/2024   COVID-19 virus infection 06/02/2023   Constipation 07/18/2022   Vitamin B12 deficiency 07/16/2022   Vitamin D  deficiency 07/16/2022   Folate deficiency 07/16/2022   Right leg weakness 07/15/2022   Neuropathy 07/15/2022   Ambulatory dysfunction 07/14/2022   Hypomagnesemia 05/28/2022   Parkinson disease (HCC) 05/25/2022   Depression 05/25/2022   Frequent falls 02/09/2021   Multiple fractures of ribs, left side, subsequent encounter for fracture with routine healing 02/08/2021   Polyneuropathy, unspecified 02/08/2021   Rhabdomyolysis 02/04/2021   Fall 02/03/2021   Osteopenia 04/07/2020   Major depressive disorder, recurrent, 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    Orientation RESPIRATION BLADDER Height & Weight     Self    Continent Weight: 63.5 kg Height:  5' 4 (162.6 cm)  BEHAVIORAL SYMPTOMS/MOOD NEUROLOGICAL BOWEL NUTRITION STATUS      Continent Diet (Regular)  AMBULATORY STATUS COMMUNICATION OF NEEDS Skin      Verbally                         Personal Care Assistance Level of Assistance  Bathing, Dressing, Feeding Bathing Assistance: Maximum assistance Feeding assistance: Limited assistance Dressing Assistance: Maximum assistance     Functional Limitations Info             SPECIAL CARE FACTORS FREQUENCY  OT (By licensed OT), PT (By licensed PT)     PT Frequency: 5 x week OT Frequency: 5 x week            Contractures      Additional Factors Info  Code Status, Allergies Code Status Info: DNR-Interventions Allergies Info: Citrus and Peanuts           Current Medications (08/20/2024):  This is the current hospital active medication list Current Facility-Administered Medications  Medication Dose Route Frequency Provider Last Rate Last Admin   acetaminophen  (TYLENOL ) tablet 650 mg  650 mg Oral Q6H PRN Krugh, Marissa C, DO       Or   acetaminophen  (TYLENOL ) suppository 650 mg  650 mg Rectal Q6H PRN Krugh, Marissa C, DO       carbidopa -levodopa  (SINEMET  IR) 25-100 MG per tablet immediate release 1.5 tablet  1.5 tablet Oral TID Amin, Sumayya, MD   1.5 tablet at 08/20/24 0858   cefTRIAXone  (ROCEPHIN ) 1 g in sodium chloride  0.9 % 100 mL IVPB  1 g Intravenous Q24H Krugh, Marissa C, DO   Stopped at 08/19/24 1802   cyanocobalamin  (VITAMIN B12) tablet 1,000 mcg  1,000 mcg Oral Daily  Krugh, Marissa C, DO   1,000 mcg at 08/20/24 0858   enoxaparin  (LOVENOX ) injection 40 mg  40 mg Subcutaneous Q24H Krugh, Marissa C, DO   40 mg at 08/19/24 2229   feeding supplement (KATE FARMS STANDARD ENT 1.4) liquid 325 mL  325 mL Oral TID BM Amin, Sumayya, MD   325 mL at 08/20/24 9092   folic acid  (FOLVITE ) tablet 1 mg  1 mg Oral Daily Krugh, Marissa C, DO   1 mg at 08/20/24 9141   furosemide (LASIX) injection 20 mg  20 mg Intravenous Once Anderson, Chelsey L, MD       multivitamin with minerals tablet 1 tablet  1 tablet Oral Daily Amin, Sumayya, MD   1 tablet at 08/20/24 9141   ondansetron  (ZOFRAN )  tablet 4 mg  4 mg Oral Q6H PRN Krugh, Marissa C, DO       Or   ondansetron  (ZOFRAN ) injection 4 mg  4 mg Intravenous Q6H PRN Krugh, Marissa C, DO       polyethylene glycol (MIRALAX  / GLYCOLAX ) packet 17 g  17 g Oral Daily PRN Amin, Sumayya, MD         Discharge Medications: Please see discharge summary for a list of discharge medications.  Relevant Imaging Results:  Relevant Lab Results:   Additional Information SSN: [id number]  Dalia GORMAN Fuse, RN

## 2024-08-20 NOTE — Consult Note (Signed)
 [facility] CARDIOLOGY CONSULT NOTE       Patient ID: Michele Brewer MRN: [id number] DOB/AGE: 1950-12-06 73 y.o.  Admit date: 08/18/2024 Referring Physician Dr. Marien Piety Primary Physician [name], MD  Primary Cardiologist None Reason for Consultation Swelling, abnormal echo  HPI: Michele Brewer is a 73 y.o. female  with a past medical history of Parkinson's disease, anemia who presented to the ED on 08/18/2024 for fall, possible syncope. Reports significant LE edema for 2-3 weeks and echo revealed valvular dysfunction. Cardiology was consulted for further evaluation.   Patient initially brought to the ED for evaluation after fall and possible syncope.  Labs yesterday notable for creatinine 0.69, potassium 3.8, hemoglobin 9.2, WBC 5.0. Troponins 50 > 58, CK 728 > 1713. EKG in the ED SR rate 107 bpm but with significant artifact. CT head without acute abnormality. Given worsening LE edema for 2-3 weeks had echo done which revealed EF 60-65%, normal diastolic function,  moderate MR, moderate TR, mild to moderate AR.   At the time my evaluation this afternoon, patient is resting comfortably in hospital bed.  We discussed her symptoms in further detail.  She reports that she initially came to the hospital after a fall.  She tells me that she has been dealing with worsening lower extremity edema up into her thighs for the last 2 to 3 weeks.  States that the only thing she has noticed that makes this better is the Lasix she was started on here at the hospital.  Denies any shortness of breath, chest pain, palpitations.  States that she has chronic dizziness/lightheadedness which she relates to her Parkinson's.  Telemetry so far has been relatively unremarkable.  Review of systems complete and found to be negative unless listed above    Past Medical History:  Diagnosis Date   Depression    Elevated CK 06/03/2023   Parkinson disease [facility])     Past Surgical History:  Procedure  Laterality Date   CESAREAN SECTION     HIP ARTHROPLASTY Right 06/17/2024   Procedure: HEMIARTHROPLASTY (BIPOLAR) HIP, POSTERIOR APPROACH FOR FRACTURE;  Surgeon: Edie Norleen PARAS, MD;  Location: [facility] ORS;  Service: Orthopedics;  Laterality: Right;    Medications Prior to Admission  Medication Sig Dispense Refill Last Dose/Taking   acetaminophen  (TYLENOL ) 325 MG tablet Take 2 tablets (650 mg total) by mouth every 6 (six) hours as needed for mild pain, moderate pain, fever or headache (or Fever >/= 101).   Taking As Needed   docusate sodium  (COLACE) 100 MG capsule Take 1 capsule (100 mg total) by mouth 2 (two) times daily. 10 capsule 0 Taking   fluticasone  (FLONASE ) 50 MCG/ACT nasal spray Place 2 sprays into both nostrils daily.   Unknown   folic acid  (FOLVITE ) 1 MG tablet Take 1 tablet (1 mg total) by mouth daily. 30 tablet 0 Unknown   meclizine  (ANTIVERT ) 25 MG tablet Take 1 tablet (25 mg total) by mouth 3 (three) times daily as needed for dizziness. 30 tablet 0 Taking As Needed   Multiple Vitamin (MULTIVITAMIN WITH MINERALS) TABS tablet Take 1 tablet by mouth daily. 30 tablet 0 Unknown   Vitamin D , Ergocalciferol , (DRISDOL ) 1.25 MG (50000 UNIT) CAPS capsule Take 1 capsule (50,000 Units total) by mouth once a week. 4 capsule 2 Past Month   buPROPion  (WELLBUTRIN  XL) 150 MG 24 hr tablet Take 1 tablet (150 mg total) by mouth daily. (Patient not taking: Reported on 06/17/2024) 90 tablet 1 Not Taking   carbidopa -levodopa  (SINEMET  IR)  25-100 MG tablet Take 1.5 tablets by mouth 2 (two) times daily. (Patient not taking: Reported on 06/17/2024)   Not Taking   carbidopa -levodopa  (SINEMET  IR) 25-100 MG tablet Take 1.5 tablets by mouth at bedtime. (Patient not taking: Reported on 06/17/2024)   Not Taking   cyanocobalamin  1000 MCG tablet Take 1 tablet (1,000 mcg total) by mouth daily. (Patient not taking: Reported on 08/19/2024) 30 tablet 0 Not Taking   DULoxetine  (CYMBALTA ) 30 MG capsule Take 1 capsule (30 mg total) by  mouth 2 (two) times daily. (Patient not taking: Reported on 06/17/2024) 180 capsule 3 Not Taking   enoxaparin  (LOVENOX ) 40 MG/0.4ML injection Inject 0.4 mLs (40 mg total) into the skin daily for 14 days. 5.6 mL 0    gabapentin  (NEURONTIN ) 100 MG capsule Take 1 capsule (100 mg total) by mouth 3 (three) times daily. (Patient not taking: Reported on 06/17/2024) 270 capsule 1 Not Taking   HYDROcodone -acetaminophen  (NORCO/VICODIN) 5-325 MG tablet Take 1-2 tablets by mouth every 6 (six) hours as needed for moderate pain (pain score 4-6) or severe pain (pain score 7-10). (Patient not taking: Reported on 08/19/2024) 15 tablet 0 Not Taking   Nutritional Supplements (FEEDING SUPPLEMENT, NEPRO CARB STEADY,) LIQD Take 237 mLs by mouth 3 (three) times daily between meals. 21330 mL 0    polyethylene glycol (MIRALAX  / GLYCOLAX ) 17 g packet Take 17 g by mouth daily as needed for moderate constipation. 30 each 0    thiamine  (VITAMIN B-1) 100 MG tablet Take 1 tablet (100 mg total) by mouth daily. (Patient not taking: Reported on 08/19/2024) 30 tablet 0 Not Taking   Social History   Socioeconomic History   Marital status: Widowed    Spouse name: Not on file   Number of children: Not on file   Years of education: Not on file   Highest education level: Not on file  Occupational History   Not on file  Tobacco Use   Smoking status: Never   Smokeless tobacco: Never  Substance and Sexual Activity   Alcohol  use: Not Currently   Drug use: Never   Sexual activity: Not on file  Other Topics Concern   Not on file  Social History Narrative   Not on file   Social Drivers of Health   Financial Resource Strain: Low Risk  (06/12/2024)   Received from [facility]   Overall Financial Resource Strain (CARDIA)    Difficulty of Paying Living Expenses: Not hard at all  Food Insecurity: No Food Insecurity (08/18/2024)   Hunger Vital Sign    Worried About Running Out of Food in the Last Year: Never true     Ran Out of Food in the Last Year: Never true  Transportation Needs: No Transportation Needs (08/18/2024)   PRAPARE - Administrator, Civil Service (Medical): No    Lack of Transportation (Non-Medical): No  Physical Activity: Not on file  Stress: Not on file  Social Connections: Moderately Isolated (08/18/2024)   Social Connection and Isolation Panel    Frequency of Communication with Friends and Family: Three times a week    Frequency of Social Gatherings with Friends and Family: Three times a week    Attends Religious Services: 1 to 4 times per year    Active Member of Clubs or Organizations: No    Attends Banker Meetings: Never    Marital Status: Never married  Intimate Partner Violence: Not At Risk (08/18/2024)   Humiliation, Afraid, Rape, and  Kick questionnaire    Fear of Current or Ex-Partner: No    Emotionally Abused: No    Physically Abused: No    Sexually Abused: No    Family History  Problem Relation Age of Onset   Gout Mother    Heart disease Father      Vitals:   08/20/24 0023 08/20/24 0404 08/20/24 0839 08/20/24 1217  BP: (!) 109/57 122/66 118/70 109/64  Pulse: 76 82 78 72  Resp: 16 16 18 18   Temp: 98.3 F (36.8 C) 98.1 F (36.7 C) 98.4 F (36.9 C) 97.8 F (36.6 C)  TempSrc:   Oral Oral  SpO2: 97% 100% 100% 100%  Weight:      Height:        PHYSICAL EXAM General: Chronically ill-appearing elderly female, well nourished, in no acute distress. HEENT: Normocephalic and atraumatic. Neck: No JVD.  Lungs: Normal respiratory effort on room air. Clear bilaterally to auscultation. No wheezes, crackles, rhonchi.  Heart: HRRR. Normal S1 and S2 without gallops.  + Murmur. Abdomen: Non-distended appearing.  Msk: Normal strength and tone for age. Extremities: Warm and well perfused. No clubbing, cyanosis.  Trace/+1 edema.  Neuro: Alert and oriented X 3. Psych: Answers questions appropriately.   Labs: Basic Metabolic Panel: Recent Labs     08/18/24 1418 08/19/24 0357  NA 143 142  K 4.2 3.8  CL 106 107  CO2 29 26  GLUCOSE 96 82  BUN 22 21  CREATININE 0.73 0.69  CALCIUM 9.8 9.2  MG  --  1.8   Liver Function Tests: Recent Labs    08/18/24 1418  AST 40  ALT 23  ALKPHOS 95  BILITOT 0.4  PROT 6.7  ALBUMIN 3.7   No results for input(s): LIPASE, AMYLASE in the last 72 hours. CBC: Recent Labs    08/18/24 1418 08/19/24 0357  WBC 5.0 5.0  HGB 10.4* 9.2*  HCT 30.2* 28.3*  MCV 96.8 94.3  PLT 211 180   Cardiac Enzymes: Recent Labs    08/18/24 1418 08/19/24 0357  CKTOTAL 728* 1,713*   BNP: No results for input(s): BNP in the last 72 hours. D-Dimer: No results for input(s): DDIMER in the last 72 hours. Hemoglobin A1C: No results for input(s): HGBA1C in the last 72 hours. Fasting Lipid Panel: No results for input(s): CHOL, HDL, LDLCALC, TRIG, CHOLHDL, LDLDIRECT in the last 72 hours. Thyroid  Function Tests: Recent Labs    08/18/24 1954  TSH 0.814   Anemia Panel: Recent Labs    08/18/24 1954  VITAMINB12 324     Radiology: ECHOCARDIOGRAM COMPLETE Result Date: 08/19/2024    ECHOCARDIOGRAM REPORT   Patient Name:   Michele Brewer Date of Exam: 08/19/2024 Medical Rec #:  [id number]     Height:       64.0 in Accession #:    [id number]    Weight:       140.0 lb Date of Birth:  06/04/1951     BSA:          1.681 m Patient Age:    73 years      BP:           111/54 mmHg Patient Gender: F             HR:           91 bpm. Exam Location:  [facility] Procedure: 2D Echo, Cardiac Doppler and Color Doppler (Both Spectral and Color  Flow Doppler were utilized during procedure). Indications:     Syncope R55  History:         Patient has no prior history of Echocardiogram examinations.                  Signs/Symptoms:Syncope.  Sonographer:     Ashley McNeely-Sloane Referring Phys:  [id number] DAVED BROCKS [facility] Diagnosing Phys: Lonni Hanson MD IMPRESSIONS  1. Left ventricular ejection fraction, by  estimation, is 60 to 65%. The left ventricle has normal function. The left ventricle has no regional wall motion abnormalities. Left ventricular diastolic parameters were normal.  2. Right ventricular systolic function is normal. The right ventricular size is normal. There is mildly elevated pulmonary artery systolic pressure.  3. Right atrial size was mildly dilated.  4. The mitral valve is degenerative. Moderate mitral valve regurgitation. No evidence of mitral stenosis.  5. Tricuspid valve regurgitation is moderate.  6. The aortic valve is tricuspid. There is mild thickening of the aortic valve. Aortic valve regurgitation is mild to moderate. No aortic stenosis is present.  7. The inferior vena cava is dilated in size with <50% respiratory variability, suggesting right atrial pressure of 15 mmHg. FINDINGS  Left Ventricle: Left ventricular ejection fraction, by estimation, is 60 to 65%. The left ventricle has normal function. The left ventricle has no regional wall motion abnormalities. Global longitudinal strain performed but not reported based on interpreter judgement due to suboptimal tracking. The left ventricular internal cavity size was normal in size. There is no left ventricular hypertrophy. Left ventricular diastolic parameters were normal. Right Ventricle: The right ventricular size is normal. No increase in right ventricular wall thickness. Right ventricular systolic function is normal. There is mildly elevated pulmonary artery systolic pressure. The tricuspid regurgitant velocity is 2.37  m/s, and with an assumed right atrial pressure of 15 mmHg, the estimated right ventricular systolic pressure is 37.5 mmHg. Left Atrium: Left atrial size was normal in size. Right Atrium: Right atrial size was mildly dilated. Pericardium: There is no evidence of pericardial effusion. Mitral Valve: The mitral valve is degenerative in appearance. There is mild thickening of the mitral valve leaflet(s). There is mild  calcification of the mitral valve leaflet(s). Moderate mitral valve regurgitation. No evidence of mitral valve stenosis. MV peak gradient, 6.8 mmHg. The mean mitral valve gradient is 3.0 mmHg. Tricuspid Valve: The tricuspid valve is normal in structure. Tricuspid valve regurgitation is moderate. Aortic Valve: The aortic valve is tricuspid. There is mild thickening of the aortic valve. Aortic valve regurgitation is mild to moderate. Aortic regurgitation PHT measures 493 msec. No aortic stenosis is present. Aortic valve mean gradient measures 4.0 mmHg. Aortic valve peak gradient measures 7.5 mmHg. Aortic valve area, by VTI measures 2.98 cm. Pulmonic Valve: The pulmonic valve was normal in structure. Pulmonic valve regurgitation is mild. Aorta: The aortic root and ascending aorta are structurally normal, with no evidence of dilitation. Pulmonary Artery: The pulmonary artery is of normal size. Venous: The inferior vena cava is dilated in size with less than 50% respiratory variability, suggesting right atrial pressure of 15 mmHg. IAS/Shunts: The interatrial septum was not well visualized. Additional Comments: 3D was performed not requiring image post processing on an independent workstation and was indeterminate.  LEFT VENTRICLE PLAX 2D LVIDd:         4.20 cm     Diastology LVIDs:         2.40 cm     LV e' medial:    9.32  cm/s LV PW:         0.80 cm     LV E/e' medial:  9.6 LV IVS:        0.90 cm     LV e' lateral:   11.30 cm/s LVOT diam:     2.10 cm     LV E/e' lateral: 7.9 LV SV:         83 LV SV Index:   49 LVOT Area:     3.46 cm LV IVRT:       77 msec  LV Volumes (MOD) LV vol d, MOD A2C: 81.2 ml LV vol d, MOD A4C: 90.6 ml LV vol s, MOD A2C: 15.3 ml LV vol s, MOD A4C: 25.6 ml LV SV MOD A2C:     65.9 ml LV SV MOD A4C:     90.6 ml LV SV MOD BP:      67.9 ml RIGHT VENTRICLE             IVC RV Basal diam:  3.30 cm     IVC diam: 2.40 cm RV Mid diam:    3.10 cm RV S prime:     19.70 cm/s  PULMONARY VEINS TAPSE (M-mode):  3.1 cm      A Reversal Duration: 173.00 msec                             A Reversal Velocity: 36.70 cm/s                             Diastolic Velocity:  38.20 cm/s                             S/D Velocity:        1.90                             Systolic Velocity:   72.40 cm/s LEFT ATRIUM             Index        RIGHT ATRIUM           Index LA diam:        3.90 cm 2.32 cm/m   RA Area:     19.50 cm LA Vol (A2C):   52.0 ml 30.93 ml/m  RA Volume:   59.40 ml  35.33 ml/m LA Vol (A4C):   34.5 ml 20.52 ml/m LA Biplane Vol: 43.4 ml 25.81 ml/m  AORTIC VALVE                    PULMONIC VALVE AV Area (Vmax):    3.39 cm     PV Vmax:          0.98 m/s AV Area (Vmean):   3.09 cm     PV Vmean:         70.600 cm/s AV Area (VTI):     2.98 cm     PV VTI:           0.198 m AV Vmax:           137.00 cm/s  PV Peak grad:     3.8 mmHg AV Vmean:          94.500 cm/s  PV Mean grad:     2.0 mmHg AV  VTI:            0.278 m      PR End Diast Vel: 3.58 msec AV Peak Grad:      7.5 mmHg     RVOT Peak grad:   2 mmHg AV Mean Grad:      4.0 mmHg LVOT Vmax:         134.00 cm/s LVOT Vmean:        84.300 cm/s LVOT VTI:          0.239 m LVOT/AV VTI ratio: 0.86 AI PHT:            493 msec  AORTA Ao Root diam: 2.80 cm Ao Asc diam:  2.60 cm MITRAL VALVE                  TRICUSPID VALVE MV Area (PHT): 3.72 cm       TV Peak grad:   30.0 mmHg MV Area VTI:   2.84 cm       TV Vmax:        2.74 m/s MV Peak grad:  6.8 mmHg       TR Peak grad:   22.5 mmHg MV Mean grad:  3.0 mmHg       TR Mean grad:   17.0 mmHg MV Vmax:       1.30 m/s       TR Vmax:        237.00 cm/s MV Vmean:      78.4 cm/s      TR Vmean:       199.0 cm/s MV Decel Time: 204 msec MR Peak grad:    95.8 mmHg    SHUNTS MR Mean grad:    76.0 mmHg    Systemic VTI:  0.24 m MR Vmax:         489.50 cm/s  Systemic Diam: 2.10 cm MR Vmean:        424.0 cm/s   Pulmonic VTI:  0.180 m MR PISA:         1.90 cm MR PISA Eff ROA: 15 mm MR PISA Radius:  0.55 cm MV E velocity: 89.10 cm/s MV A velocity:  118.00 cm/s MV E/A ratio:  0.76 Lonni End MD Electronically signed by Lonni Hanson MD Signature Date/Time: 08/19/2024/12:16:14 PM    Final    DG Tibia/Fibula Left Result Date: 08/18/2024 CLINICAL DATA:  Fall.  Left-sided pain. EXAM: LEFT TIBIA AND FIBULA - 2 VIEW COMPARISON:  None Available. FINDINGS: No acute fracture involving the left tibia or fibula. Calcaneal spurring. Bilateral soft tissue swelling at the left ankle. Left ankle is located. Left knee is located. Degenerative changes in left knee. IMPRESSION: 1. No acute bone abnormality to the left tibia or fibula. 2. Soft tissue swelling at the left ankle. Electronically Signed   By: Juliene Balder M.D.   On: 08/18/2024 13:41   DG HIP UNILAT WITH PELVIS 2-3 VIEWS LEFT Result Date: 08/18/2024 CLINICAL DATA:  Left-sided pain.  Status post fall. EXAM: DG HIP (WITH OR WITHOUT PELVIS) 2-3V LEFT COMPARISON:  06/17/2024 FINDINGS: Status post right hip arthroplasty. The right femoral stem is not completely imaged. Pelvic bony ring is intact. Left hip is located without acute fracture. IMPRESSION: 1. No acute abnormality to the left hip. 2. Status post right hip arthroplasty. Electronically Signed   By: Juliene Balder M.D.   On: 08/18/2024 13:39   DG Humerus Left Result Date: 08/18/2024 CLINICAL DATA:  Fall.  Left-sided pain.  EXAM: LEFT HUMERUS - 2+ VIEW COMPARISON:  None Available. FINDINGS: Left humerus is intact without a fracture. Mild elevation of the distal left clavicle. Degenerative changes at the left [facility] joint. Evidence for old left rib fractures. Limited evaluation of the left elbow. IMPRESSION: 1. No acute bone abnormality to the left humerus. 2. Mild elevation of the distal left clavicle. Findings could be associated with an age-indeterminate AC joint injury. Recommend clinical correlation in this area. Electronically Signed   By: Juliene Balder M.D.   On: 08/18/2024 13:38   CT Head Wo Contrast Result Date: 08/18/2024 EXAM: CT HEAD WITHOUT  08/18/2024 12:00:32 PM TECHNIQUE: CT of the head was performed without the administration of intravenous contrast. Automated exposure control, iterative reconstruction, and/or weight based adjustment of the mA/kV was utilized to reduce the radiation dose to as low as reasonably achievable. COMPARISON: 06/02/2023 CLINICAL HISTORY: Head trauma, minor (Age >= 65y) FINDINGS: BRAIN AND VENTRICLES: No acute intracranial hemorrhage. No mass effect or midline shift. No extra-axial fluid collection. No evidence of acute infarct. No hydrocephalus. Cerebral ventricle sizes concordant with degree of cerebral volume loss. ORBITS: No acute abnormality. SINUSES AND MASTOIDS: No acute abnormality. SOFT TISSUES AND SKULL: No acute skull fracture. No acute soft tissue abnormality. IMPRESSION: 1. No acute intracranial abnormality related to head trauma. 2. Cerebral ventricle sizes concordant with degree of cerebral volume loss. Electronically signed by: Evalene Coho MD 08/18/2024 12:13 PM EST RP Workstation: [id number]   CT Cervical Spine Wo Contrast Result Date: 08/18/2024 CLINICAL DATA:  Neck trauma (Age >= 65y) EXAM: CT CERVICAL SPINE WITHOUT CONTRAST TECHNIQUE: Multidetector CT imaging of the cervical spine was performed without intravenous contrast. Multiplanar CT image reconstructions were also generated. RADIATION DOSE REDUCTION: This exam was performed according to the departmental dose-optimization program which includes automated exposure control, adjustment of the mA and/or kV according to patient size and/or use of iterative reconstruction technique. COMPARISON:  MRI cervical spine 07/15/2022. CT cervical spine 02/03/2021. FINDINGS: Alignment: Chronic reversal of the usual cervical lordosis, similar to previous CT. No focal angulation or significant listhesis. Skull base and vertebrae: No evidence of acute cervical spine fracture or traumatic subluxation. Soft tissues and spinal canal: No prevertebral fluid or  swelling. No visible canal hematoma. Disc levels: Chronic spondylosis with disc space narrowing, uncinate spurring and facet hypertrophy. Chronic interfacetal ankylosis on the right at C2-3. Chronic foraminal narrowing, greatest on the right at C4-5 and on the left at C6-7. No large disc herniation identified. Upper chest: Clear lung apices. Other: Asymmetric TMJ degenerative changes on the left. IMPRESSION: 1. No evidence of acute cervical spine fracture, traumatic subluxation or static signs of instability. 2. Chronic cervical spondylosis as described. Electronically Signed   By: Elsie Perone M.D.   On: 08/18/2024 12:12   US  Venous Img Lower Bilateral Result Date: 08/02/2024 CLINICAL DATA:  Leg swelling. EXAM: BILATERAL LOWER EXTREMITY VENOUS DOPPLER ULTRASOUND TECHNIQUE: Gray-scale sonography with compression, as well as color and duplex ultrasound, were performed to evaluate the deep venous system(s) from the level of the common femoral vein through the popliteal and proximal calf veins. COMPARISON:  07/14/2022. FINDINGS: VENOUS Normal compressibility of the common femoral, superficial femoral, and popliteal veins, as well as the visualized calf veins. Visualized portions of profunda femoral vein and great saphenous vein unremarkable. No filling defects to suggest DVT on grayscale or color Doppler imaging. Doppler waveforms show normal direction of venous flow, normal respiratory plasticity and response to augmentation. Limited views of the contralateral common femoral  vein are unremarkable. OTHER Small amount of fluid is noted in the popliteal fossa on the left. Limitations: none IMPRESSION: 1. No evidence of deep venous thrombosis bilaterally. 2. Small amount of fluid in the popliteal fossa on the left. Electronically Signed   By: Leita Birmingham M.D.   On: 08/02/2024 14:40   DG Hip Unilat W or Wo Pelvis 2-3 Views Right Result Date: 08/02/2024 CLINICAL DATA:  Fall.  Pain. EXAM: DG HIP (WITH OR WITHOUT  PELVIS) 2-3V RIGHT COMPARISON:  06/17/2024 FINDINGS: Status post right hip replacement. No evidence for periprosthetic fracture. No dislocation. Stable appearance of the right superior inferior pubic rami without definite evidence for acute fracture. SI joints and symphysis pubis unremarkable. Degenerative changes noted lower lumbar spine. IMPRESSION: Status post right hip replacement without evidence for periprosthetic fracture or dislocation. Electronically Signed   By: Camellia Candle M.D.   On: 08/02/2024 08:34    ECHO as above  TELEMETRY (personally reviewed): sinus rhythm PVCs rate 80s  EKG (personally reviewed): SR rate 107 bpm but with significant artifact  Data reviewed by me 08/20/2024: last 24h vitals tele labs imaging I/O ED provider note, admission H&P  Principal Problem:   Syncope Active Problems:   Fall   Rhabdomyolysis   Parkinson disease (HCC)   Ambulatory dysfunction   Malnutrition of moderate degree   UTI (urinary tract infection)   Chronic anemia    ASSESSMENT AND PLAN:  Michele Brewer is a 73 y.o. female  with a past medical history of Parkinson's disease, anemia who presented to the ED on 08/18/2024 for fall, possible syncope. Reports significant LE edema for 2-3 weeks and echo revealed valvular dysfunction. Cardiology was consulted for further evaluation.   # Generalized edema # Valvular heart disease # Parkinson's disease Patient initially presented for evaluation after fall, also reporting 2 to 3 weeks of generalized body swelling.  Echo was done and this revealed EF 60-65%, no wall motion abnormalities, normal diastolic function, moderate MR, moderate TR, mild to moderate AR.  Received 1 dose of IV Lasix 20 mg today with good UOP reported by patient. - Can try another dose of IV Lasix 20 mg tomorrow and assess response. - Patient does not have heart failure based on echo findings, valvular regurgitation can contribute to edema but feel that this is likely  multifactorial. - Telemetry unrevealing.    This patient's plan of care was discussed and created with Dr. Florencio and he is in agreement.  Signed: Danita Bloch, PA-C  08/20/2024, 3:58 PM [facility] Cardiology

## 2024-08-21 DIAGNOSIS — R55 Syncope and collapse: Secondary | ICD-10-CM | POA: Diagnosis not present

## 2024-08-21 LAB — HEPATIC FUNCTION PANEL
ALT: 12 U/L (ref 0–44)
AST: 54 U/L — ABNORMAL HIGH (ref 15–41)
Albumin: 3 g/dL — ABNORMAL LOW (ref 3.5–5.0)
Alkaline Phosphatase: 90 U/L (ref 38–126)
Bilirubin, Direct: 0.2 mg/dL (ref 0.0–0.2)
Indirect Bilirubin: 0.2 mg/dL — ABNORMAL LOW (ref 0.3–0.9)
Total Bilirubin: 0.4 mg/dL (ref 0.0–1.2)
Total Protein: 5.5 g/dL — ABNORMAL LOW (ref 6.5–8.1)

## 2024-08-21 LAB — VITAMIN B6: Vitamin B6: 3 ug/L — ABNORMAL LOW (ref 3.4–65.2)

## 2024-08-21 LAB — VITAMIN E
Vitamin E (Alpha Tocopherol): 7 mg/L — ABNORMAL LOW (ref 9.0–29.0)
Vitamin E(Gamma Tocopherol): 1 mg/L (ref 0.5–4.9)

## 2024-08-21 LAB — MISC LABCORP TEST (SEND OUT): Labcorp test code: 724195

## 2024-08-21 LAB — BASIC METABOLIC PANEL WITH GFR
Anion gap: 7 (ref 5–15)
BUN: 13 mg/dL (ref 8–23)
CO2: 28 mmol/L (ref 22–32)
Calcium: 8.7 mg/dL — ABNORMAL LOW (ref 8.9–10.3)
Chloride: 105 mmol/L (ref 98–111)
Creatinine, Ser: 0.59 mg/dL (ref 0.44–1.00)
GFR, Estimated: >60 mL/min (ref >60)
Glucose, Bld: 92 mg/dL (ref 70–99)
Potassium: 3.6 mmol/L (ref 3.5–5.1)
Sodium: 140 mmol/L (ref 135–145)

## 2024-08-21 LAB — VITAMIN B1: Vitamin B1 (Thiamine): 75.5 nmol/L (ref 66.5–200.0)

## 2024-08-21 MED ORDER — VITAMIN B-6 100 MG PO TABS
100.0000 mg | ORAL_TABLET | Freq: Every day | ORAL | Status: DC
  Administered 2024-08-21 – 2024-08-25 (×5): 100 mg via ORAL
  Filled 2024-08-21 (×5): qty 1

## 2024-08-21 MED ORDER — FUROSEMIDE 10 MG/ML IJ SOLN
20.0000 mg | Freq: Two times a day (BID) | INTRAMUSCULAR | Status: DC
  Administered 2024-08-21 – 2024-08-24 (×6): 20 mg via INTRAVENOUS
  Filled 2024-08-21 (×6): qty 2

## 2024-08-21 NOTE — TOC Progression Note (Signed)
 Transition of Care [facility]) - Progression Note    Patient Details  Name: Michele Brewer MRN: [id number] Date of Birth: 07/24/1951  [facility]) CM/SW Contact  Dalia GORMAN Fuse, RN Phone Number: 08/21/2024, 11:28 AM  Clinical Narrative:     Patient has a bed offer from [facility]. TOC accepted in the hub and made Tammy at [facility] aware. TOC started ins auth with [employer].  Expected Discharge Plan: Skilled Nursing Facility Barriers to Discharge: Continued Medical Work up               Expected Discharge Plan and Services   Discharge Planning Services: CM Consult   Living arrangements for the past 2 months: Single Family Home                                       Social Drivers of Health (SDOH) Interventions SDOH Screenings   Food Insecurity: No Food Insecurity (08/18/2024)  Housing: Low Risk  (08/18/2024)  Transportation Needs: No Transportation Needs (08/18/2024)  Utilities: Not At Risk (08/18/2024)  Financial Resource Strain: Low Risk  (06/12/2024)   Received from [facility]  Social Connections: Moderately Isolated (08/18/2024)  Tobacco Use: Low Risk  (08/18/2024)    Readmission Risk Interventions     No data to display

## 2024-08-21 NOTE — Progress Notes (Signed)
 Nutrition Follow-up  DOCUMENTATION CODES:   Non-severe (moderate) malnutrition in context of chronic illness  INTERVENTION:   -Continue MVI with minerals daily -Continue regular diet -Continue [employer] 1.4 PO TID, each supplement provides 455 kcal and 20 grams protein -Will draw labs to check for micronutrient deficiencies which may be linked to possible nutrition related causes for muscle weakness, ataxia, and chronic sinemet  use: thiamine , biotin, vitamin E,  manganese, vitamin C . Also check CRP to best interpret lab values -100 mg vitamin B6 daily x 30 days  NUTRITION DIAGNOSIS:   Moderate Malnutrition related to chronic illness (Parkinson's) as evidenced by mild fat depletion, moderate fat depletion, moderate muscle depletion, severe muscle depletion, edema.  Ongoing  GOAL:   Patient will meet greater than or equal to 90% of their needs  Progressing  MONITOR:   PO intake, Supplement acceptance  REASON FOR ASSESSMENT:   Consult Assessment of nutrition requirement/status  ASSESSMENT:   73 year old female with Parkinson's disease presents after a fall for syncopal episode at home.  Found to have a possible UTI.  She was unable to ambulate in the emergency department.  Admitted observation for syncope workup, physical therapy evaluation, and treatment of possible UTI  Reviewed I/O's: -840 ml x 24 hours and +1.3 L since admission  UOP: 1.1 L x 24 hours   Spoke with patient who was sitting in recliner chair at time of visit. Patient offered no complaints other than frequent urination secondary to diuretics (currently receiving lasix).   Patient shares that her appetite has improved, but also noted that she is a very selective eater. Noted meal completions 20-80%. Patient shares she ate all of her breakfast this morning, minus the french toast, as she did not like the taste of the hospital french toast. She has not tried the [employer] supplement yet, per her report, but  is willing to try it. Noted minimal acceptance per RN documentation.   Discussed importance of good meal and supplement intake to promote healing. Reviewed care plan; patient amenable to continue. She had not further concerns or questions at time of visit.   No new weight since last visit.   Per TOC notes, plan to discharge to SNF ([facility]) once medically stable.   Medications reviewed and include sinemet , vitamin B-12, lovenox , folic acid , lasix, and MVI.   Labs reviewed. Vitamin B-12, vitamin D , copper , and CRP WDL. Vitamine B6: 3.0 Thiamine , biotin, vitamin E, manganese, and vitamin C  levels still pending.   Diet Order:   Diet Order             Diet regular Room service appropriate? Yes; Fluid consistency: Thin  Diet effective now                   EDUCATION NEEDS:   Education needs have been addressed  Skin:  Skin Assessment: Reviewed RN Assessment  Last BM:  Unknown  Height:   Ht Readings from Last 1 Encounters:  08/18/24 5' 4 (1.626 m)    Weight:   Wt Readings from Last 1 Encounters:  08/18/24 63.5 kg    Ideal Body Weight:  54.5 kg  BMI:  Body mass index is 24.03 kg/m.  Estimated Nutritional Needs:   Kcal:  1900-2100  Protein:  105-120 grams  Fluid:  1.9-2.1 L    Margery ORN, RD, LDN, CDCES Registered Dietitian III Certified Diabetes Care and Education Specialist If unable to reach this RD, please use RD Inpatient group chat on secure chat between  hours of 8am-4 pm daily

## 2024-08-21 NOTE — Plan of Care (Signed)

## 2024-08-21 NOTE — Progress Notes (Signed)
 PROGRESS NOTE Michele Brewer    DOB: 1951/06/01, 73 y.o.  FMW:[id number]    Code Status: Do not attempt resuscitation (DNR) PRE-ARREST INTERVENTIONS DESIRED   DOA: 08/18/2024   LOS: 2  Brief hospital course  Michele Brewer is a 73 y.o. female with a PMH significant for Parkinson's disease, anemia.  They presented from home  to the ED on 08/18/2024 with fall with possible syncope. No significant traumatic injuries sustained.   In the ED, it was found that they had mild rhabdomyolysis and UTI.  Significant findings included echo showing valvular disease with MVR, TVR, AVR, and mildly dilated RA. No systolic or diastolic abnormalities.  They were initially treated with IVF hydration.  She has since been started on gentle diuresis with improvement in her swelling. Cardiology following.  Patient was admitted to medicine service for further workup and management of fall as outlined in detail below.  08/21/24 -clinically stable. Awaiting SNF  Assessment & Plan  Principal Problem:   Syncope Active Problems:   Fall   Ambulatory dysfunction   Rhabdomyolysis   UTI (urinary tract infection)   Malnutrition of moderate degree   Parkinson disease (HCC)   Chronic anemia  Fall- likely multifactorial including parkinson's and new leg edema, UTI causing weakness. Cannot rule out syncope. No abnormalities on telemetry. Orthostatics negative on admission - continue telemetry.  - echo showing valvular disease as listed above.   - cardiology consulted.  - treat the treatable  -PT and OT evaluation- recommending SNF. TOC consulted - Continue to monitor   Edema- gravity dependent and pitting. Possibly due to time on floor was on her left side that she has worse on L side of her body. The swelling pre-dates her fall. Systolic and diastolic function appear normal. Valvular disease may be contributing. TSH normal.  - stopped fluids and gave lasix  x1. She is diuretic naiive.   - had good response. Now her  left arm swelling is resolved completely. Will continue diuresis and strict I/O - cardiology consulted to evaluate - add on liver panel- liver enzymes normal   Rhabdomyolysis CK at 1713, likely secondary to fall. - s/p IVF due to swelling  UTI- suggested on UA. Cultures negative.  - stop ceftriaxone  -Follow-up urine cultures   Malnutrition of moderate degree Estimated body mass index is 24.03 kg/m as calculated from the following:   Height as of this encounter: 5' 4 (1.626 m).   Weight as of this encounter: 63.5 kg.  - Dietitian was consulted -Multiple nutritional labs ordered -Continue with supplemental   Parkinson disease (HCC) - continue Sinemet    Chronic anemia Hemoglobin seems stable.  Multiple deficiencies include iron , B12 and folate noted on last hospitalization. - Continue with supplement  Body mass index is 24.03 kg/m.  VTE ppx: enoxaparin  (LOVENOX ) injection 40 mg Start: 08/18/24 2200  Diet:     Diet   Diet regular Room service appropriate? Yes; Fluid consistency: Thin   Consultants: Cardiology   Subjective 08/21/24    Pt reports feeling improved. Agreeable to snf if it is an option. Left arm swelling resolved. Legs are essentially the same    Objective  Blood pressure 122/66, pulse 82, temperature 98.1 F (36.7 C), resp. rate 16, height 5' 4 (1.626 m), weight 63.5 kg, SpO2 100%.  Intake/Output Summary (Last 24 hours) at 08/21/2024 0740 Last data filed at 08/20/2024 2100 Gross per 24 hour  Intake 240 ml  Output 1080 ml  Net -840 ml   Filed Weights   08/18/24  1110  Weight: 63.5 kg    Physical Exam:  General: awake, alert, NAD HEENT: atraumatic, clear conjunctiva, anicteric sclera, MMM, hearing grossly normal Respiratory: normal respiratory effort. Cardiovascular: extremities well perfused, quick capillary refill, regular rhythm. Systolic murmur present. Pitting edema to bilateral lower extremities  Nervous: A&O x3. no gross focal neurologic  deficits, normal speech Extremities: moves all equally, normal tone Skin: dry, intact, normal temperature, normal color. No rashes, lesions or ulcers on exposed skin Psychiatry: normal mood, congruent affect  Labs   I have personally reviewed the following labs and imaging studies CBC    Component Value Date/Time   WBC 5.0 08/19/2024 0357   RBC 3.00 (L) 08/19/2024 0357   HGB 9.2 (L) 08/19/2024 0357   HCT 28.3 (L) 08/19/2024 0357   PLT 180 08/19/2024 0357   MCV 94.3 08/19/2024 0357   MCH 30.7 08/19/2024 0357   MCHC 32.5 08/19/2024 0357   RDW 14.1 08/19/2024 0357   LYMPHSABS 0.9 08/02/2024 0920   MONOABS 0.6 08/02/2024 0920   EOSABS 0.1 08/02/2024 0920   BASOSABS 0.0 08/02/2024 0920      Latest Ref Rng & Units 08/21/2024    6:23 AM 08/19/2024    3:57 AM 08/18/2024    2:18 PM  BMP  Glucose 70 - 99 mg/dL 92  82  96   BUN 8 - 23 mg/dL 13  21  22    Creatinine 0.44 - 1.00 mg/dL 9.40  9.30  9.26   Sodium 135 - 145 mmol/L 140  142  143   Potassium 3.5 - 5.1 mmol/L 3.6  3.8  4.2   Chloride 98 - 111 mmol/L 105  107  106   CO2 22 - 32 mmol/L 28  26  29    Calcium 8.9 - 10.3 mg/dL 8.7  9.2  9.8    ECHOCARDIOGRAM COMPLETE Result Date: 08/19/2024    ECHOCARDIOGRAM REPORT   Patient Name:   Michele Brewer Date of Exam: 08/19/2024 Medical Rec #:  [id number]     Height:       64.0 in Accession #:    [id number]    Weight:       140.0 lb Date of Birth:  19-Apr-1951     BSA:          1.681 m Patient Age:    73 years      BP:           111/54 mmHg Patient Gender: F             HR:           91 bpm. Exam Location:  [facility] Procedure: 2D Echo, Cardiac Doppler and Color Doppler (Both Spectral and Color            Flow Doppler were utilized during procedure). Indications:     Syncope R55  History:         Patient has no prior history of Echocardiogram examinations.                  Signs/Symptoms:Syncope.  Sonographer:     Ashley McNeely-Sloane Referring Phys:  [id number] DAVED BROCKS [facility] Diagnosing Phys: Lonni Hanson MD IMPRESSIONS  1. Left ventricular ejection fraction, by estimation, is 60 to 65%. The left ventricle has normal function. The left ventricle has no regional wall motion abnormalities. Left ventricular diastolic parameters were normal.  2. Right ventricular systolic function is normal. The right ventricular size is normal. There is mildly elevated pulmonary artery systolic pressure.  3. Right atrial size was mildly dilated.  4. The mitral valve is degenerative. Moderate mitral valve regurgitation. No evidence of mitral stenosis.  5. Tricuspid valve regurgitation is moderate.  6. The aortic valve is tricuspid. There is mild thickening of the aortic valve. Aortic valve regurgitation is mild to moderate. No aortic stenosis is present.  7. The inferior vena cava is dilated in size with <50% respiratory variability, suggesting right atrial pressure of 15 mmHg. FINDINGS  Left Ventricle: Left ventricular ejection fraction, by estimation, is 60 to 65%. The left ventricle has normal function. The left ventricle has no regional wall motion abnormalities. Global longitudinal strain performed but not reported based on interpreter judgement due to suboptimal tracking. The left ventricular internal cavity size was normal in size. There is no left ventricular hypertrophy. Left ventricular diastolic parameters were normal. Right Ventricle: The right ventricular size is normal. No increase in right ventricular wall thickness. Right ventricular systolic function is normal. There is mildly elevated pulmonary artery systolic pressure. The tricuspid regurgitant velocity is 2.37  m/s, and with an assumed right atrial pressure of 15 mmHg, the estimated right ventricular systolic pressure is 37.5 mmHg. Left Atrium: Left atrial size was normal in size. Right Atrium: Right atrial size was mildly dilated. Pericardium: There is no evidence of pericardial effusion. Mitral Valve: The mitral valve is degenerative in appearance. There is mild  thickening of the mitral valve leaflet(s). There is mild calcification of the mitral valve leaflet(s). Moderate mitral valve regurgitation. No evidence of mitral valve stenosis. MV peak gradient, 6.8 mmHg. The mean mitral valve gradient is 3.0 mmHg. Tricuspid Valve: The tricuspid valve is normal in structure. Tricuspid valve regurgitation is moderate. Aortic Valve: The aortic valve is tricuspid. There is mild thickening of the aortic valve. Aortic valve regurgitation is mild to moderate. Aortic regurgitation PHT measures 493 msec. No aortic stenosis is present. Aortic valve mean gradient measures 4.0 mmHg. Aortic valve peak gradient measures 7.5 mmHg. Aortic valve area, by VTI measures 2.98 cm. Pulmonic Valve: The pulmonic valve was normal in structure. Pulmonic valve regurgitation is mild. Aorta: The aortic root and ascending aorta are structurally normal, with no evidence of dilitation. Pulmonary Artery: The pulmonary artery is of normal size. Venous: The inferior vena cava is dilated in size with less than 50% respiratory variability, suggesting right atrial pressure of 15 mmHg. IAS/Shunts: The interatrial septum was not well visualized. Additional Comments: 3D was performed not requiring image post processing on an independent workstation and was indeterminate.  LEFT VENTRICLE PLAX 2D LVIDd:         4.20 cm     Diastology LVIDs:         2.40 cm     LV e' medial:    9.32 cm/s LV PW:         0.80 cm     LV E/e' medial:  9.6 LV IVS:        0.90 cm     LV e' lateral:   11.30 cm/s LVOT diam:     2.10 cm     LV E/e' lateral: 7.9 LV SV:         83 LV SV Index:   49 LVOT Area:     3.46 cm LV IVRT:       77 msec  LV Volumes (MOD) LV vol d, MOD A2C: 81.2 ml LV vol d, MOD A4C: 90.6 ml LV vol s, MOD A2C: 15.3 ml LV vol s, MOD A4C: 25.6 ml  LV SV MOD A2C:     65.9 ml LV SV MOD A4C:     90.6 ml LV SV MOD BP:      67.9 ml RIGHT VENTRICLE             IVC RV Basal diam:  3.30 cm     IVC diam: 2.40 cm RV Mid diam:    3.10 cm RV  S prime:     19.70 cm/s  PULMONARY VEINS TAPSE (M-mode): 3.1 cm      A Reversal Duration: 173.00 msec                             A Reversal Velocity: 36.70 cm/s                             Diastolic Velocity:  38.20 cm/s                             S/D Velocity:        1.90                             Systolic Velocity:   72.40 cm/s LEFT ATRIUM             Index        RIGHT ATRIUM           Index LA diam:        3.90 cm 2.32 cm/m   RA Area:     19.50 cm LA Vol (A2C):   52.0 ml 30.93 ml/m  RA Volume:   59.40 ml  35.33 ml/m LA Vol (A4C):   34.5 ml 20.52 ml/m LA Biplane Vol: 43.4 ml 25.81 ml/m  AORTIC VALVE                    PULMONIC VALVE AV Area (Vmax):    3.39 cm     PV Vmax:          0.98 m/s AV Area (Vmean):   3.09 cm     PV Vmean:         70.600 cm/s AV Area (VTI):     2.98 cm     PV VTI:           0.198 m AV Vmax:           137.00 cm/s  PV Peak grad:     3.8 mmHg AV Vmean:          94.500 cm/s  PV Mean grad:     2.0 mmHg AV VTI:            0.278 m      PR End Diast Vel: 3.58 msec AV Peak Grad:      7.5 mmHg     RVOT Peak grad:   2 mmHg AV Mean Grad:      4.0 mmHg LVOT Vmax:         134.00 cm/s LVOT Vmean:        84.300 cm/s LVOT VTI:          0.239 m LVOT/AV VTI ratio: 0.86 AI PHT:            493 msec  AORTA Ao Root diam: 2.80 cm Ao Asc diam:  2.60 cm MITRAL VALVE  TRICUSPID VALVE MV Area (PHT): 3.72 cm       TV Peak grad:   30.0 mmHg MV Area VTI:   2.84 cm       TV Vmax:        2.74 m/s MV Peak grad:  6.8 mmHg       TR Peak grad:   22.5 mmHg MV Mean grad:  3.0 mmHg       TR Mean grad:   17.0 mmHg MV Vmax:       1.30 m/s       TR Vmax:        237.00 cm/s MV Vmean:      78.4 cm/s      TR Vmean:       199.0 cm/s MV Decel Time: 204 msec MR Peak grad:    95.8 mmHg    SHUNTS MR Mean grad:    76.0 mmHg    Systemic VTI:  0.24 m MR Vmax:         489.50 cm/s  Systemic Diam: 2.10 cm MR Vmean:        424.0 cm/s   Pulmonic VTI:  0.180 m MR PISA:         1.90 cm MR PISA Eff ROA: 15 mm MR PISA  Radius:  0.55 cm MV E velocity: 89.10 cm/s MV A velocity: 118.00 cm/s MV E/A ratio:  0.76 Lonni End MD Electronically signed by Lonni Hanson MD Signature Date/Time: 08/19/2024/12:16:14 PM    Final    Disposition Plan & Communication  Patient status: Inpatient  Admitted From: Home Planned disposition location: Skilled nursing facility Anticipated discharge date: 12/5 pending cardiology clearance   Family Communication: none at bedside    Author: Marien LITTIE Piety, DO [facility] 08/21/2024, 7:40 AM   Available by Epic secure chat 7AM-7PM. If 7PM-7AM, please contact night-coverage.  [facility] contact information found on [email or web address].

## 2024-08-21 NOTE — Progress Notes (Signed)
 [facility] CLINIC CARDIOLOGY PROGRESS NOTE       Patient ID: Michele Brewer MRN: [id number] DOB/AGE: 04-20-1951 73 y.o.  Admit date: 08/18/2024 Referring Physician Dr. Marien Piety Primary Physician [name], MD  Primary Cardiologist None Reason for Consultation Swelling, abnormal echo  HPI: Michele Brewer is a 73 y.o. female  with a past medical history of Parkinson's disease, anemia who presented to the ED on 08/18/2024 for fall, possible syncope. Reports significant LE edema for 2-3 weeks and echo revealed valvular dysfunction. Cardiology was consulted for further evaluation.   Interval history: -Patient seen and examined this AM, resting in bedside chair.  -Reports feeling stronger today, legs still swollen and reports the top of her feet feel tighter.  -BP stable this AM. No events on tele.   Review of systems complete and found to be negative unless listed above    Past Medical History:  Diagnosis Date   Depression    Elevated CK 06/03/2023   Parkinson disease [facility])     Past Surgical History:  Procedure Laterality Date   CESAREAN SECTION     HIP ARTHROPLASTY Right 06/17/2024   Procedure: HEMIARTHROPLASTY (BIPOLAR) HIP, POSTERIOR APPROACH FOR FRACTURE;  Surgeon: Edie Norleen PARAS, MD;  Location: [facility] ORS;  Service: Orthopedics;  Laterality: Right;    Medications Prior to Admission  Medication Sig Dispense Refill Last Dose/Taking   acetaminophen  (TYLENOL ) 325 MG tablet Take 2 tablets (650 mg total) by mouth every 6 (six) hours as needed for mild pain, moderate pain, fever or headache (or Fever >/= 101).   Taking As Needed   docusate sodium  (COLACE) 100 MG capsule Take 1 capsule (100 mg total) by mouth 2 (two) times daily. 10 capsule 0 Taking   fluticasone  (FLONASE ) 50 MCG/ACT nasal spray Place 2 sprays into both nostrils daily.   Unknown   folic acid  (FOLVITE ) 1 MG tablet Take 1 tablet (1 mg total) by mouth daily. 30 tablet 0 Unknown   meclizine  (ANTIVERT ) 25 MG  tablet Take 1 tablet (25 mg total) by mouth 3 (three) times daily as needed for dizziness. 30 tablet 0 Taking As Needed   Multiple Vitamin (MULTIVITAMIN WITH MINERALS) TABS tablet Take 1 tablet by mouth daily. 30 tablet 0 Unknown   Vitamin D , Ergocalciferol , (DRISDOL ) 1.25 MG (50000 UNIT) CAPS capsule Take 1 capsule (50,000 Units total) by mouth once a week. 4 capsule 2 Past Month   buPROPion  (WELLBUTRIN  XL) 150 MG 24 hr tablet Take 1 tablet (150 mg total) by mouth daily. (Patient not taking: Reported on 06/17/2024) 90 tablet 1 Not Taking   carbidopa -levodopa  (SINEMET  IR) 25-100 MG tablet Take 1.5 tablets by mouth 2 (two) times daily. (Patient not taking: Reported on 06/17/2024)   Not Taking   carbidopa -levodopa  (SINEMET  IR) 25-100 MG tablet Take 1.5 tablets by mouth at bedtime. (Patient not taking: Reported on 06/17/2024)   Not Taking   cyanocobalamin  1000 MCG tablet Take 1 tablet (1,000 mcg total) by mouth daily. (Patient not taking: Reported on 08/19/2024) 30 tablet 0 Not Taking   DULoxetine  (CYMBALTA ) 30 MG capsule Take 1 capsule (30 mg total) by mouth 2 (two) times daily. (Patient not taking: Reported on 06/17/2024) 180 capsule 3 Not Taking   enoxaparin  (LOVENOX ) 40 MG/0.4ML injection Inject 0.4 mLs (40 mg total) into the skin daily for 14 days. 5.6 mL 0    gabapentin  (NEURONTIN ) 100 MG capsule Take 1 capsule (100 mg total) by mouth 3 (three) times daily. (Patient not taking: Reported on 06/17/2024) 270 capsule  1 Not Taking   HYDROcodone -acetaminophen  (NORCO/VICODIN) 5-325 MG tablet Take 1-2 tablets by mouth every 6 (six) hours as needed for moderate pain (pain score 4-6) or severe pain (pain score 7-10). (Patient not taking: Reported on 08/19/2024) 15 tablet 0 Not Taking   Nutritional Supplements (FEEDING SUPPLEMENT, NEPRO CARB STEADY,) LIQD Take 237 mLs by mouth 3 (three) times daily between meals. 21330 mL 0    polyethylene glycol (MIRALAX  / GLYCOLAX ) 17 g packet Take 17 g by mouth daily as needed for  moderate constipation. 30 each 0    thiamine  (VITAMIN B-1) 100 MG tablet Take 1 tablet (100 mg total) by mouth daily. (Patient not taking: Reported on 08/19/2024) 30 tablet 0 Not Taking   Social History   Socioeconomic History   Marital status: Widowed    Spouse name: Not on file   Number of children: Not on file   Years of education: Not on file   Highest education level: Not on file  Occupational History   Not on file  Tobacco Use   Smoking status: Never   Smokeless tobacco: Never  Substance and Sexual Activity   Alcohol  use: Not Currently   Drug use: Never   Sexual activity: Not on file  Other Topics Concern   Not on file  Social History Narrative   Not on file   Social Drivers of Health   Financial Resource Strain: Low Risk  (06/12/2024)   Received from [facility]   Overall Financial Resource Strain (CARDIA)    Difficulty of Paying Living Expenses: Not hard at all  Food Insecurity: No Food Insecurity (08/18/2024)   Hunger Vital Sign    Worried About Running Out of Food in the Last Year: Never true    Ran Out of Food in the Last Year: Never true  Transportation Needs: No Transportation Needs (08/18/2024)   PRAPARE - Administrator, Civil Service (Medical): No    Lack of Transportation (Non-Medical): No  Physical Activity: Not on file  Stress: Not on file  Social Connections: Moderately Isolated (08/18/2024)   Social Connection and Isolation Panel    Frequency of Communication with Friends and Family: Three times a week    Frequency of Social Gatherings with Friends and Family: Three times a week    Attends Religious Services: 1 to 4 times per year    Active Member of Clubs or Organizations: No    Attends Banker Meetings: Never    Marital Status: Never married  Intimate Partner Violence: Not At Risk (08/18/2024)   Humiliation, Afraid, Rape, and Kick questionnaire    Fear of Current or Ex-Partner: No    Emotionally Abused: No     Physically Abused: No    Sexually Abused: No    Family History  Problem Relation Age of Onset   Gout Mother    Heart disease Father      Vitals:   08/20/24 1714 08/20/24 2150 08/21/24 0033 08/21/24 0510  BP: 105/61 (!) 110/55 (!) 110/54 117/66  Pulse: 81 79 78 65  Resp: 16 16 18 16   Temp: 97.8 F (36.6 C) 97.8 F (36.6 C) 98.4 F (36.9 C) 98.2 F (36.8 C)  TempSrc:    Oral  SpO2: 100% 100% 97% 99%  Weight:      Height:        PHYSICAL EXAM General: Chronically ill-appearing elderly female, well nourished, in no acute distress. HEENT: Normocephalic and atraumatic. Neck: No JVD.  Lungs: Normal  respiratory effort on room air. Clear bilaterally to auscultation. No wheezes, crackles, rhonchi.  Heart: HRRR. Normal S1 and S2 without gallops.  + Murmur. Abdomen: Non-distended appearing.  Msk: Normal strength and tone for age. Extremities: Warm and well perfused. No clubbing, cyanosis.  +2 edema.  Neuro: Alert and oriented X 3. Psych: Answers questions appropriately.   Labs: Basic Metabolic Panel: Recent Labs    08/19/24 0357 08/21/24 0623  NA 142 140  K 3.8 3.6  CL 107 105  CO2 26 28  GLUCOSE 82 92  BUN 21 13  CREATININE 0.69 0.59  CALCIUM 9.2 8.7*  MG 1.8  --    Liver Function Tests: Recent Labs    08/18/24 1418 08/21/24 0623  AST 40 54*  ALT 23 12  ALKPHOS 95 90  BILITOT 0.4 0.4  PROT 6.7 5.5*  ALBUMIN 3.7 3.0*   No results for input(s): LIPASE, AMYLASE in the last 72 hours. CBC: Recent Labs    08/18/24 1418 08/19/24 0357  WBC 5.0 5.0  HGB 10.4* 9.2*  HCT 30.2* 28.3*  MCV 96.8 94.3  PLT 211 180   Cardiac Enzymes: Recent Labs    08/18/24 1418 08/19/24 0357  CKTOTAL 728* 1,713*   BNP: No results for input(s): BNP in the last 72 hours. D-Dimer: No results for input(s): DDIMER in the last 72 hours. Hemoglobin A1C: No results for input(s): HGBA1C in the last 72 hours. Fasting Lipid Panel: No results for input(s): CHOL,  HDL, LDLCALC, TRIG, CHOLHDL, LDLDIRECT in the last 72 hours. Thyroid  Function Tests: Recent Labs    08/18/24 1954  TSH 0.814   Anemia Panel: Recent Labs    08/18/24 1954  VITAMINB12 324     Radiology: ECHOCARDIOGRAM COMPLETE Result Date: 08/19/2024    ECHOCARDIOGRAM REPORT   Patient Name:   Michele Brewer Date of Exam: 08/19/2024 Medical Rec #:  [id number]     Height:       64.0 in Accession #:    [id number]    Weight:       140.0 lb Date of Birth:  27-Sep-1950     BSA:          1.681 m Patient Age:    73 years      BP:           111/54 mmHg Patient Gender: F             HR:           91 bpm. Exam Location:  [facility] Procedure: 2D Echo, Cardiac Doppler and Color Doppler (Both Spectral and Color            Flow Doppler were utilized during procedure). Indications:     Syncope R55  History:         Patient has no prior history of Echocardiogram examinations.                  Signs/Symptoms:Syncope.  Sonographer:     Ashley McNeely-Sloane Referring Phys:  [id number] DAVED BROCKS [facility] Diagnosing Phys: Lonni Hanson MD IMPRESSIONS  1. Left ventricular ejection fraction, by estimation, is 60 to 65%. The left ventricle has normal function. The left ventricle has no regional wall motion abnormalities. Left ventricular diastolic parameters were normal.  2. Right ventricular systolic function is normal. The right ventricular size is normal. There is mildly elevated pulmonary artery systolic pressure.  3. Right atrial size was mildly dilated.  4. The mitral valve is degenerative. Moderate mitral valve regurgitation. No  evidence of mitral stenosis.  5. Tricuspid valve regurgitation is moderate.  6. The aortic valve is tricuspid. There is mild thickening of the aortic valve. Aortic valve regurgitation is mild to moderate. No aortic stenosis is present.  7. The inferior vena cava is dilated in size with <50% respiratory variability, suggesting right atrial pressure of 15 mmHg. FINDINGS  Left Ventricle: Left  ventricular ejection fraction, by estimation, is 60 to 65%. The left ventricle has normal function. The left ventricle has no regional wall motion abnormalities. Global longitudinal strain performed but not reported based on interpreter judgement due to suboptimal tracking. The left ventricular internal cavity size was normal in size. There is no left ventricular hypertrophy. Left ventricular diastolic parameters were normal. Right Ventricle: The right ventricular size is normal. No increase in right ventricular wall thickness. Right ventricular systolic function is normal. There is mildly elevated pulmonary artery systolic pressure. The tricuspid regurgitant velocity is 2.37  m/s, and with an assumed right atrial pressure of 15 mmHg, the estimated right ventricular systolic pressure is 37.5 mmHg. Left Atrium: Left atrial size was normal in size. Right Atrium: Right atrial size was mildly dilated. Pericardium: There is no evidence of pericardial effusion. Mitral Valve: The mitral valve is degenerative in appearance. There is mild thickening of the mitral valve leaflet(s). There is mild calcification of the mitral valve leaflet(s). Moderate mitral valve regurgitation. No evidence of mitral valve stenosis. MV peak gradient, 6.8 mmHg. The mean mitral valve gradient is 3.0 mmHg. Tricuspid Valve: The tricuspid valve is normal in structure. Tricuspid valve regurgitation is moderate. Aortic Valve: The aortic valve is tricuspid. There is mild thickening of the aortic valve. Aortic valve regurgitation is mild to moderate. Aortic regurgitation PHT measures 493 msec. No aortic stenosis is present. Aortic valve mean gradient measures 4.0 mmHg. Aortic valve peak gradient measures 7.5 mmHg. Aortic valve area, by VTI measures 2.98 cm. Pulmonic Valve: The pulmonic valve was normal in structure. Pulmonic valve regurgitation is mild. Aorta: The aortic root and ascending aorta are structurally normal, with no evidence of dilitation.  Pulmonary Artery: The pulmonary artery is of normal size. Venous: The inferior vena cava is dilated in size with less than 50% respiratory variability, suggesting right atrial pressure of 15 mmHg. IAS/Shunts: The interatrial septum was not well visualized. Additional Comments: 3D was performed not requiring image post processing on an independent workstation and was indeterminate.  LEFT VENTRICLE PLAX 2D LVIDd:         4.20 cm     Diastology LVIDs:         2.40 cm     LV e' medial:    9.32 cm/s LV PW:         0.80 cm     LV E/e' medial:  9.6 LV IVS:        0.90 cm     LV e' lateral:   11.30 cm/s LVOT diam:     2.10 cm     LV E/e' lateral: 7.9 LV SV:         83 LV SV Index:   49 LVOT Area:     3.46 cm LV IVRT:       77 msec  LV Volumes (MOD) LV vol d, MOD A2C: 81.2 ml LV vol d, MOD A4C: 90.6 ml LV vol s, MOD A2C: 15.3 ml LV vol s, MOD A4C: 25.6 ml LV SV MOD A2C:     65.9 ml LV SV MOD A4C:  90.6 ml LV SV MOD BP:      67.9 ml RIGHT VENTRICLE             IVC RV Basal diam:  3.30 cm     IVC diam: 2.40 cm RV Mid diam:    3.10 cm RV S prime:     19.70 cm/s  PULMONARY VEINS TAPSE (M-mode): 3.1 cm      A Reversal Duration: 173.00 msec                             A Reversal Velocity: 36.70 cm/s                             Diastolic Velocity:  38.20 cm/s                             S/D Velocity:        1.90                             Systolic Velocity:   72.40 cm/s LEFT ATRIUM             Index        RIGHT ATRIUM           Index LA diam:        3.90 cm 2.32 cm/m   RA Area:     19.50 cm LA Vol (A2C):   52.0 ml 30.93 ml/m  RA Volume:   59.40 ml  35.33 ml/m LA Vol (A4C):   34.5 ml 20.52 ml/m LA Biplane Vol: 43.4 ml 25.81 ml/m  AORTIC VALVE                    PULMONIC VALVE AV Area (Vmax):    3.39 cm     PV Vmax:          0.98 m/s AV Area (Vmean):   3.09 cm     PV Vmean:         70.600 cm/s AV Area (VTI):     2.98 cm     PV VTI:           0.198 m AV Vmax:           137.00 cm/s  PV Peak grad:     3.8 mmHg AV Vmean:           94.500 cm/s  PV Mean grad:     2.0 mmHg AV VTI:            0.278 m      PR End Diast Vel: 3.58 msec AV Peak Grad:      7.5 mmHg     RVOT Peak grad:   2 mmHg AV Mean Grad:      4.0 mmHg LVOT Vmax:         134.00 cm/s LVOT Vmean:        84.300 cm/s LVOT VTI:          0.239 m LVOT/AV VTI ratio: 0.86 AI PHT:            493 msec  AORTA Ao Root diam: 2.80 cm Ao Asc diam:  2.60 cm MITRAL VALVE                  TRICUSPID VALVE  MV Area (PHT): 3.72 cm       TV Peak grad:   30.0 mmHg MV Area VTI:   2.84 cm       TV Vmax:        2.74 m/s MV Peak grad:  6.8 mmHg       TR Peak grad:   22.5 mmHg MV Mean grad:  3.0 mmHg       TR Mean grad:   17.0 mmHg MV Vmax:       1.30 m/s       TR Vmax:        237.00 cm/s MV Vmean:      78.4 cm/s      TR Vmean:       199.0 cm/s MV Decel Time: 204 msec MR Peak grad:    95.8 mmHg    SHUNTS MR Mean grad:    76.0 mmHg    Systemic VTI:  0.24 m MR Vmax:         489.50 cm/s  Systemic Diam: 2.10 cm MR Vmean:        424.0 cm/s   Pulmonic VTI:  0.180 m MR PISA:         1.90 cm MR PISA Eff ROA: 15 mm MR PISA Radius:  0.55 cm MV E velocity: 89.10 cm/s MV A velocity: 118.00 cm/s MV E/A ratio:  0.76 Lonni End MD Electronically signed by Lonni Hanson MD Signature Date/Time: 08/19/2024/12:16:14 PM    Final    DG Tibia/Fibula Left Result Date: 08/18/2024 CLINICAL DATA:  Fall.  Left-sided pain. EXAM: LEFT TIBIA AND FIBULA - 2 VIEW COMPARISON:  None Available. FINDINGS: No acute fracture involving the left tibia or fibula. Calcaneal spurring. Bilateral soft tissue swelling at the left ankle. Left ankle is located. Left knee is located. Degenerative changes in left knee. IMPRESSION: 1. No acute bone abnormality to the left tibia or fibula. 2. Soft tissue swelling at the left ankle. Electronically Signed   By: Juliene Balder M.D.   On: 08/18/2024 13:41   DG HIP UNILAT WITH PELVIS 2-3 VIEWS LEFT Result Date: 08/18/2024 CLINICAL DATA:  Left-sided pain.  Status post fall. EXAM: DG HIP (WITH OR  WITHOUT PELVIS) 2-3V LEFT COMPARISON:  06/17/2024 FINDINGS: Status post right hip arthroplasty. The right femoral stem is not completely imaged. Pelvic bony ring is intact. Left hip is located without acute fracture. IMPRESSION: 1. No acute abnormality to the left hip. 2. Status post right hip arthroplasty. Electronically Signed   By: Juliene Balder M.D.   On: 08/18/2024 13:39   DG Humerus Left Result Date: 08/18/2024 CLINICAL DATA:  Fall.  Left-sided pain. EXAM: LEFT HUMERUS - 2+ VIEW COMPARISON:  None Available. FINDINGS: Left humerus is intact without a fracture. Mild elevation of the distal left clavicle. Degenerative changes at the left [facility] joint. Evidence for old left rib fractures. Limited evaluation of the left elbow. IMPRESSION: 1. No acute bone abnormality to the left humerus. 2. Mild elevation of the distal left clavicle. Findings could be associated with an age-indeterminate AC joint injury. Recommend clinical correlation in this area. Electronically Signed   By: Juliene Balder M.D.   On: 08/18/2024 13:38   CT Head Wo Contrast Result Date: 08/18/2024 EXAM: CT HEAD WITHOUT 08/18/2024 12:00:32 PM TECHNIQUE: CT of the head was performed without the administration of intravenous contrast. Automated exposure control, iterative reconstruction, and/or weight based adjustment of the mA/kV was utilized to reduce the radiation dose to as low as reasonably  achievable. COMPARISON: 06/02/2023 CLINICAL HISTORY: Head trauma, minor (Age >= 65y) FINDINGS: BRAIN AND VENTRICLES: No acute intracranial hemorrhage. No mass effect or midline shift. No extra-axial fluid collection. No evidence of acute infarct. No hydrocephalus. Cerebral ventricle sizes concordant with degree of cerebral volume loss. ORBITS: No acute abnormality. SINUSES AND MASTOIDS: No acute abnormality. SOFT TISSUES AND SKULL: No acute skull fracture. No acute soft tissue abnormality. IMPRESSION: 1. No acute intracranial abnormality related to head trauma. 2.  Cerebral ventricle sizes concordant with degree of cerebral volume loss. Electronically signed by: Evalene Coho MD 08/18/2024 12:13 PM EST RP Workstation: [id number]   CT Cervical Spine Wo Contrast Result Date: 08/18/2024 CLINICAL DATA:  Neck trauma (Age >= 65y) EXAM: CT CERVICAL SPINE WITHOUT CONTRAST TECHNIQUE: Multidetector CT imaging of the cervical spine was performed without intravenous contrast. Multiplanar CT image reconstructions were also generated. RADIATION DOSE REDUCTION: This exam was performed according to the departmental dose-optimization program which includes automated exposure control, adjustment of the mA and/or kV according to patient size and/or use of iterative reconstruction technique. COMPARISON:  MRI cervical spine 07/15/2022. CT cervical spine 02/03/2021. FINDINGS: Alignment: Chronic reversal of the usual cervical lordosis, similar to previous CT. No focal angulation or significant listhesis. Skull base and vertebrae: No evidence of acute cervical spine fracture or traumatic subluxation. Soft tissues and spinal canal: No prevertebral fluid or swelling. No visible canal hematoma. Disc levels: Chronic spondylosis with disc space narrowing, uncinate spurring and facet hypertrophy. Chronic interfacetal ankylosis on the right at C2-3. Chronic foraminal narrowing, greatest on the right at C4-5 and on the left at C6-7. No large disc herniation identified. Upper chest: Clear lung apices. Other: Asymmetric TMJ degenerative changes on the left. IMPRESSION: 1. No evidence of acute cervical spine fracture, traumatic subluxation or static signs of instability. 2. Chronic cervical spondylosis as described. Electronically Signed   By: Elsie Perone M.D.   On: 08/18/2024 12:12   US  Venous Img Lower Bilateral Result Date: 08/02/2024 CLINICAL DATA:  Leg swelling. EXAM: BILATERAL LOWER EXTREMITY VENOUS DOPPLER ULTRASOUND TECHNIQUE: Gray-scale sonography with compression, as well as color and  duplex ultrasound, were performed to evaluate the deep venous system(s) from the level of the common femoral vein through the popliteal and proximal calf veins. COMPARISON:  07/14/2022. FINDINGS: VENOUS Normal compressibility of the common femoral, superficial femoral, and popliteal veins, as well as the visualized calf veins. Visualized portions of profunda femoral vein and great saphenous vein unremarkable. No filling defects to suggest DVT on grayscale or color Doppler imaging. Doppler waveforms show normal direction of venous flow, normal respiratory plasticity and response to augmentation. Limited views of the contralateral common femoral vein are unremarkable. OTHER Small amount of fluid is noted in the popliteal fossa on the left. Limitations: none IMPRESSION: 1. No evidence of deep venous thrombosis bilaterally. 2. Small amount of fluid in the popliteal fossa on the left. Electronically Signed   By: Leita Birmingham M.D.   On: 08/02/2024 14:40   DG Hip Unilat W or Wo Pelvis 2-3 Views Right Result Date: 08/02/2024 CLINICAL DATA:  Fall.  Pain. EXAM: DG HIP (WITH OR WITHOUT PELVIS) 2-3V RIGHT COMPARISON:  06/17/2024 FINDINGS: Status post right hip replacement. No evidence for periprosthetic fracture. No dislocation. Stable appearance of the right superior inferior pubic rami without definite evidence for acute fracture. SI joints and symphysis pubis unremarkable. Degenerative changes noted lower lumbar spine. IMPRESSION: Status post right hip replacement without evidence for periprosthetic fracture or dislocation. Electronically Signed   By:  Camellia Candle M.D.   On: 08/02/2024 08:34    ECHO as above  TELEMETRY (personally reviewed): sinus rhythm PVCs rate 80s  EKG (personally reviewed): SR rate 107 bpm but with significant artifact  Data reviewed by me 08/21/2024: last 24h vitals tele labs imaging I/O ED provider note, admission H&P  Principal Problem:   Syncope Active Problems:   Fall    Rhabdomyolysis   Parkinson disease (HCC)   Ambulatory dysfunction   Malnutrition of moderate degree   UTI (urinary tract infection)   Chronic anemia    ASSESSMENT AND PLAN:  Michele Brewer is a 73 y.o. female  with a past medical history of Parkinson's disease, anemia who presented to the ED on 08/18/2024 for fall, possible syncope. Reports significant LE edema for 2-3 weeks and echo revealed valvular dysfunction. Cardiology was consulted for further evaluation.   # Generalized edema # Valvular heart disease # Parkinson's disease Patient initially presented for evaluation after fall, also reporting 2 to 3 weeks of generalized body swelling.  Echo was done and this revealed EF 60-65%, no wall motion abnormalities, normal diastolic function, moderate MR, moderate TR, mild to moderate AR.  Received 1 dose of IV Lasix 20 mg today with good UOP reported by patient. - Will continue IV lasix at 20 mg BID. Would benefit from leg wraps.  - Patient does not have heart failure based on echo findings, valvular regurgitation can contribute to edema but feel that this is likely multifactorial. - Telemetry remains unremarkable.    This patient's plan of care was discussed and created with Dr. Florencio and he is in agreement.  Signed: Danita Bloch, PA-C  08/21/2024, 10:35 AM [facility]

## 2024-08-21 NOTE — Progress Notes (Signed)
 Mobility Specialist - Progress Note    08/21/24 1100  Orthostatic Lying   Pulse- Lying 77  Orthostatic Sitting  BP- Sitting 113/53  Pulse- Sitting 77  Orthostatic Standing at 0 minutes  BP- Standing at 0 minutes 109/61  Pulse- Standing at 0 minutes 84  Orthostatic Standing at 3 minutes  BP- Standing at 3 minutes 110/63  Pulse- Standing at 3 minutes 85  Mobility  Activity Ambulated with assistance;Stood at bedside;Respositioned in chair  Level of Assistance Contact guard assist, steadying assist  Assistive Device Front wheel walker  Distance Ambulated (ft) 25 ft  Range of Motion/Exercises Active;All extremities  Activity Response Tolerated well  Mobility visit 1 Mobility  Mobility Specialist Start Time (ACUTE ONLY) [id number]  Mobility Specialist Stop Time (ACUTE ONLY) 1011  Mobility Specialist Time Calculation (min) (ACUTE ONLY) 28 min   Pt was at the EOB on RA with nurse in the room upon entry. Pt agreed to mobility. Pt is orthostatic and vitals were taken throughout activity. Pt BP vitals were WNL. Pt is able to STS with minA CGA. Pt ambulated well. Pt did state that there is LE pain present. Pt after activity is repositioned in the recliner with needs in reach and chair alarm on.  Clem Rodes Mobility Specialist 08/21/24, 11:06 AM

## 2024-08-22 ENCOUNTER — Inpatient Hospital Stay

## 2024-08-22 DIAGNOSIS — E440 Moderate protein-calorie malnutrition: Secondary | ICD-10-CM | POA: Diagnosis not present

## 2024-08-22 DIAGNOSIS — R930 Abnormal findings on diagnostic imaging of skull and head, not elsewhere classified: Secondary | ICD-10-CM | POA: Diagnosis not present

## 2024-08-22 DIAGNOSIS — R262 Difficulty in walking, not elsewhere classified: Secondary | ICD-10-CM | POA: Diagnosis not present

## 2024-08-22 DIAGNOSIS — T796XXS Traumatic ischemia of muscle, sequela: Secondary | ICD-10-CM | POA: Diagnosis not present

## 2024-08-22 DIAGNOSIS — G20A1 Parkinson's disease without dyskinesia, without mention of fluctuations: Secondary | ICD-10-CM

## 2024-08-22 DIAGNOSIS — R531 Weakness: Secondary | ICD-10-CM

## 2024-08-22 DIAGNOSIS — D649 Anemia, unspecified: Secondary | ICD-10-CM

## 2024-08-22 DIAGNOSIS — R600 Localized edema: Secondary | ICD-10-CM

## 2024-08-22 DIAGNOSIS — R55 Syncope and collapse: Secondary | ICD-10-CM | POA: Diagnosis not present

## 2024-08-22 LAB — BASIC METABOLIC PANEL WITH GFR
Anion gap: 7 (ref 5–15)
BUN: 12 mg/dL (ref 8–23)
CO2: 29 mmol/L (ref 22–32)
Calcium: 8.9 mg/dL (ref 8.9–10.3)
Chloride: 103 mmol/L (ref 98–111)
Creatinine, Ser: 0.6 mg/dL (ref 0.44–1.00)
GFR, Estimated: >60 mL/min (ref >60)
Glucose, Bld: 86 mg/dL (ref 70–99)
Potassium: 3.7 mmol/L (ref 3.5–5.1)
Sodium: 138 mmol/L (ref 135–145)

## 2024-08-22 MED ORDER — MIDODRINE HCL 5 MG PO TABS
2.5000 mg | ORAL_TABLET | Freq: Three times a day (TID) | ORAL | Status: DC
  Administered 2024-08-22 – 2024-08-24 (×5): 2.5 mg via ORAL
  Filled 2024-08-22 (×5): qty 1

## 2024-08-22 NOTE — Progress Notes (Addendum)
 Progress Note   Patient: Michele Brewer FMW:[id number] DOB: 1951/07/08 DOA: 08/18/2024     3 DOS: the patient was seen and examined on 08/22/2024   Brief hospital course: Partly taken from H&P.  Michele Brewer is a 73 y.o. female with medical history significant of Parkinson's disease, Presents to the ED for evaluation after a fall at home. She is unable to recall what happened.  In the emergency department, she was hemodynamically stable.  No leukocytosis.  CK elevated at 728, CT head, C-spine, and x-rays of the left humerus, left tib-fib, left hip all without acute abnormality.  Her UA was indicative of a UTI.  She was unable to ambulate in the emergency department.   She was started on ceftriaxone , urine cultures were obtained.  Was admitted for concern of fall versus syncope with ambulatory dysfunction.  12/2: Vital stable, slight worsening of CK to 1713, continuing IV fluid, B12 of 324, urine cultures pending. 12/5.  Peer to peer done and does not look promising for rehab.  Patient states she is falling over to the left.  Will get MRI of the brain.  ABIs ordered prior to compression wraps.  Assessment and Plan: * Syncope Syncope versus fall. History of ambulatory dysfunction. Blood pressure on the lower side will start low-dose midodrine .  Continue working with physical therapy.  Physical therapy recommending rehab but insurance company likely will not approve.   Rhabdomyolysis CK at 1713, likely secondary to fall. Received IV fluids during hospital course  UTI (urinary tract infection) Ruled out.  Urine culture negative.  Malnutrition of moderate degree Continue supplements  Parkinson disease (HCC) On Sinemet   Chronic anemia Last hemoglobin 9.2, continue B12 supplementation  Weakness Feels like she is leaning over when moving around.  MRI of the brain negative.  Lower extremity edema Bilaterally.  On IV Lasix .  Will get ABIs and wound care nurse to evaluate for  compression wraps.        Subjective: Patient needed help to get out of the bed into the chair.  Had a good bowel movement today.  Physical Exam: Vitals:   08/21/24 2342 08/22/24 0413 08/22/24 0854 08/22/24 1144  BP: (!) 122/56 118/63 (!) 96/59 103/62  Pulse: 78 75 86 79  Resp: 20 18 17 17   Temp: 98.8 F (37.1 C) 98 F (36.7 C) 97.7 F (36.5 C) 97.7 F (36.5 C)  TempSrc: Oral Oral Oral   SpO2: 100% 100% 98% 100%  Weight:      Height:       Physical Exam HENT:     Head: Normocephalic.  Eyes:     General: Lids are normal.     Conjunctiva/sclera: Conjunctivae normal.  Cardiovascular:     Rate and Rhythm: Normal rate and regular rhythm.     Heart sounds: Normal heart sounds, S1 normal and S2 normal.  Pulmonary:     Breath sounds: No decreased breath sounds, wheezing, rhonchi or rales.  Abdominal:     Palpations: Abdomen is soft.     Tenderness: There is no abdominal tenderness.  Musculoskeletal:     Right lower leg: Swelling present.     Left lower leg: Swelling present.  Skin:    General: Skin is warm.     Comments: Chronic lower extremity discoloration  Neurological:     Mental Status: She is alert.     Data Reviewed: MRI showed no acute intracranial abnormality Creatinine 0.6, electrolytes normal range Urine culture no growth  Disposition: Status is: Inpatient Remains  inpatient appropriate because: Physical therapy recommending rehab.  Patient needed help just to get out of the bed today.  Peer to peer done and does not look promising for rehab  Planned Discharge Destination: Home with Home Health    Time spent: 28 minutes  Author: Charlie Patterson, MD 08/22/2024 2:10 PM  For on call review [email or web address].

## 2024-08-22 NOTE — Progress Notes (Signed)
 Occupational Therapy Treatment Patient Details Name: Michele Brewer MRN: [id number] DOB: 1951/02/07 Today's Date: 08/22/2024   History of present illness presented to ER secondary to fall in home environment; admitted for management of syncope, possible UTI.  All imaging (tib/fib, L hip, L humerus, C-spine, CTH) negative for acute injury.  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 Upon arrival to room patient sitting in recliner, agreeable to treatment. Patient requires min A to transition into standing, demonstrates good body mechanics, ambulates with r/w with increased gait speed this date into bathroom; tolerated dynamic standing task at sink for 3 minutes with intermittent CGA, no LOB noted. Patient reports no increase in pain. Patient ambulated back to recliner with SBA and transferred with CGA.  Patient ended treatment in recliner with bed/chair alarm on and all needs within reach. Patient making good progress toward goals, will continue to follow POC. Discharge recommendation remains appropriate.        If plan is discharge home, recommend the following:  A little help with bathing/dressing/bathroom;A little help with walking and/or transfers;Assistance with cooking/housework;Assistance with feeding;Assist for transportation   Equipment Recommendations  None recommended by OT    Recommendations for Other Services      Precautions / Restrictions Precautions Precautions: Fall;Posterior Hip Precaution Booklet Issued: Yes (comment) Recall of Precautions/Restrictions: Intact Restrictions Weight Bearing Restrictions Per Provider Order: No       Mobility Bed Mobility                    Transfers Overall transfer level: Needs assistance Equipment used: Rolling walker (2 wheels) Transfers: Sit to/from Stand Sit to Stand: Contact guard assist, Min assist           General  transfer comment: patient was able to intiate standing but required min A to complete lift     Balance Overall balance assessment: Needs assistance Sitting-balance support: No upper extremity supported, Feet supported Sitting balance-Leahy Scale: Good Sitting balance - Comments: lists L with fatigue, divded attention with functional tasks, min assist to correct Postural control: Right lateral lean Standing balance support: Bilateral upper extremity supported, During functional activity, Reliant on assistive device for balance Standing balance-Leahy Scale: Fair Standing balance comment: Pt is at high risk of falls due to parkinsonian gait kinematics and hx of previous falls                           ADL either performed or assessed with clinical judgement   ADL Overall ADL's : Needs assistance/impaired     Grooming: Wash/dry hands;Oral care;Standing;Contact guard assist                               Functional mobility during ADLs: Contact guard assist;Rolling walker (2 wheels) General ADL Comments: ambulated in room with r/w with SBA, stood at sink for 3 minutes to perform grooming tasks    Extremity/Trunk Assessment              Vision       Perception     Praxis     Communication Communication Communication: No apparent difficulties Factors Affecting Communication: Reduced clarity of speech   Cognition Arousal: Alert Behavior During Therapy: WFL for tasks assessed/performed Cognition: No apparent impairments  Following commands: Intact        Cueing   Cueing Techniques: Verbal cues  Exercises      Shoulder Instructions       General Comments      Pertinent Vitals/ Pain       Pain Assessment Pain Assessment: No/denies pain  Home Living                                          Prior Functioning/Environment              Frequency  Min 2X/week         Progress Toward Goals  OT Goals(current goals can now be found in the care plan section)  Progress towards OT goals: Progressing toward goals  Acute Rehab OT Goals Patient Stated Goal: to go home OT Goal Formulation: With patient Time For Goal Achievement: 09/02/24 Potential to Achieve Goals: Good ADL Goals Pt Will Perform Grooming: with supervision;sitting;standing Pt Will Perform Lower Body Bathing: with supervision;sit to/from stand;with adaptive equipment Pt Will Perform Lower Body Dressing: with supervision;with adaptive equipment;sit to/from stand Pt Will Transfer to Toilet: with supervision;ambulating;grab bars;regular height toilet  Plan      Co-evaluation                 AM-PAC OT 6 Clicks Daily Activity     Outcome Measure   Help from another person eating meals?: A Little Help from another person taking care of personal grooming?: A Little Help from another person toileting, which includes using toliet, bedpan, or urinal?: A Little Help from another person bathing (including washing, rinsing, drying)?: A Little Help from another person to put on and taking off regular upper body clothing?: A Little Help from another person to put on and taking off regular lower body clothing?: A Lot 6 Click Score: 17    End of Session Equipment Utilized During Treatment: Gait belt;Rolling walker (2 wheels)  OT Visit Diagnosis: Unsteadiness on feet (R26.81);Other abnormalities of gait and mobility (R26.89);Muscle weakness (generalized) (M62.81);History of falling (Z91.81);Feeding difficulties (R63.3)   Activity Tolerance Patient tolerated treatment well   Patient Left with call bell/phone within reach;in chair;with family/visitor present   Nurse Communication Mobility status        Time: 8484-8467 OT Time Calculation (min): 17 min  Charges: OT General Charges $OT Visit: 1 Visit OT Treatments $Self Care/Home Management : 8-22 mins  Rogers Clause, OT/L MSOT,  08/22/2024

## 2024-08-22 NOTE — Assessment & Plan Note (Addendum)
 CK at 1713 on presentation.  Likely secondary to fall.  Last CK2 04. Received IV fluids during hospital course

## 2024-08-22 NOTE — Progress Notes (Signed)
 [facility] CLINIC CARDIOLOGY PROGRESS NOTE       Patient ID: Michele Brewer MRN: [id number] DOB/AGE: [birth year] 73 y.o.  Admit date: 08/18/2024 Referring Physician Dr. Marien Piety Primary Physician [name], MD  Primary Cardiologist None Reason for Consultation Swelling, abnormal echo  HPI: Michele Brewer is a 73 y.o. female  with a past medical history of Parkinson's disease, anemia who presented to the ED on 08/18/2024 for fall, possible syncope. Reports significant LE edema for 2-3 weeks and echo revealed valvular dysfunction. Cardiology was consulted for further evaluation.   Interval history: -Patient seen and examined this AM, resting in bedside chair.  -Reports feeling stronger today, states that her legs do feel slightly less swollen today but still with significant edema. -BP stable this AM, borderline but she is without symptoms.   Review of systems complete and found to be negative unless listed above    Past Medical History:  Diagnosis Date   Depression    Elevated CK 06/03/2023   Parkinson disease [facility])     Past Surgical History:  Procedure Laterality Date   CESAREAN SECTION     HIP ARTHROPLASTY Right 06/17/2024   Procedure: HEMIARTHROPLASTY (BIPOLAR) HIP, POSTERIOR APPROACH FOR FRACTURE;  Surgeon: Edie Norleen PARAS, MD;  Location: [facility] ORS;  Service: Orthopedics;  Laterality: Right;    Medications Prior to Admission  Medication Sig Dispense Refill Last Dose/Taking   acetaminophen  (TYLENOL ) 325 MG tablet Take 2 tablets (650 mg total) by mouth every 6 (six) hours as needed for mild pain, moderate pain, fever or headache (or Fever >/= 101).   Taking As Needed   docusate sodium  (COLACE) 100 MG capsule Take 1 capsule (100 mg total) by mouth 2 (two) times daily. 10 capsule 0 Taking   fluticasone  (FLONASE ) 50 MCG/ACT nasal spray Place 2 sprays into both nostrils daily.   Unknown   folic acid  (FOLVITE ) 1 MG tablet Take 1 tablet (1 mg total) by mouth daily. 30 tablet  0 Unknown   meclizine  (ANTIVERT ) 25 MG tablet Take 1 tablet (25 mg total) by mouth 3 (three) times daily as needed for dizziness. 30 tablet 0 Taking As Needed   Multiple Vitamin (MULTIVITAMIN WITH MINERALS) TABS tablet Take 1 tablet by mouth daily. 30 tablet 0 Unknown   Vitamin D , Ergocalciferol , (DRISDOL ) 1.25 MG (50000 UNIT) CAPS capsule Take 1 capsule (50,000 Units total) by mouth once a week. 4 capsule 2 Past Month   buPROPion  (WELLBUTRIN  XL) 150 MG 24 hr tablet Take 1 tablet (150 mg total) by mouth daily. (Patient not taking: Reported on 06/17/2024) 90 tablet 1 Not Taking   carbidopa -levodopa  (SINEMET  IR) 25-100 MG tablet Take 1.5 tablets by mouth 2 (two) times daily. (Patient not taking: Reported on 06/17/2024)   Not Taking   carbidopa -levodopa  (SINEMET  IR) 25-100 MG tablet Take 1.5 tablets by mouth at bedtime. (Patient not taking: Reported on 06/17/2024)   Not Taking   cyanocobalamin  1000 MCG tablet Take 1 tablet (1,000 mcg total) by mouth daily. (Patient not taking: Reported on 08/19/2024) 30 tablet 0 Not Taking   DULoxetine  (CYMBALTA ) 30 MG capsule Take 1 capsule (30 mg total) by mouth 2 (two) times daily. (Patient not taking: Reported on 06/17/2024) 180 capsule 3 Not Taking   enoxaparin  (LOVENOX ) 40 MG/0.4ML injection Inject 0.4 mLs (40 mg total) into the skin daily for 14 days. 5.6 mL 0    gabapentin  (NEURONTIN ) 100 MG capsule Take 1 capsule (100 mg total) by mouth 3 (three) times daily. (Patient not taking: Reported  on 06/17/2024) 270 capsule 1 Not Taking   HYDROcodone -acetaminophen  (NORCO/VICODIN) 5-325 MG tablet Take 1-2 tablets by mouth every 6 (six) hours as needed for moderate pain (pain score 4-6) or severe pain (pain score 7-10). (Patient not taking: Reported on 08/19/2024) 15 tablet 0 Not Taking   Nutritional Supplements (FEEDING SUPPLEMENT, NEPRO CARB STEADY,) LIQD Take 237 mLs by mouth 3 (three) times daily between meals. 21330 mL 0    polyethylene glycol (MIRALAX  / GLYCOLAX ) 17 g packet  Take 17 g by mouth daily as needed for moderate constipation. 30 each 0    thiamine  (VITAMIN B-1) 100 MG tablet Take 1 tablet (100 mg total) by mouth daily. (Patient not taking: Reported on 08/19/2024) 30 tablet 0 Not Taking   Social History   Socioeconomic History   Marital status: Widowed    Spouse name: Not on file   Number of children: Not on file   Years of education: Not on file   Highest education level: Not on file  Occupational History   Not on file  Tobacco Use   Smoking status: Never   Smokeless tobacco: Never  Substance and Sexual Activity   Alcohol  use: Not Currently   Drug use: Never   Sexual activity: Not on file  Other Topics Concern   Not on file  Social History Narrative   Not on file   Social Drivers of Health   Financial Resource Strain: Low Risk  (06/12/2024)   Received from [facility]   Overall Financial Resource Strain (CARDIA)    Difficulty of Paying Living Expenses: Not hard at all  Food Insecurity: No Food Insecurity (08/18/2024)   Hunger Vital Sign    Worried About Running Out of Food in the Last Year: Never true    Ran Out of Food in the Last Year: Never true  Transportation Needs: No Transportation Needs (08/18/2024)   PRAPARE - Administrator, Civil Service (Medical): No    Lack of Transportation (Non-Medical): No  Physical Activity: Not on file  Stress: Not on file  Social Connections: Moderately Isolated (08/18/2024)   Social Connection and Isolation Panel    Frequency of Communication with Friends and Family: Three times a week    Frequency of Social Gatherings with Friends and Family: Three times a week    Attends Religious Services: 1 to 4 times per year    Active Member of Clubs or Organizations: No    Attends Banker Meetings: Never    Marital Status: Never married  Intimate Partner Violence: Not At Risk (08/18/2024)   Humiliation, Afraid, Rape, and Kick questionnaire    Fear of Current or  Ex-Partner: No    Emotionally Abused: No    Physically Abused: No    Sexually Abused: No    Family History  Problem Relation Age of Onset   Gout Mother    Heart disease Father      Vitals:   08/21/24 1956 08/21/24 2342 08/22/24 0413 08/22/24 0854  BP: 121/72 (!) 122/56 118/63 (!) 96/59  Pulse: 83 78 75 86  Resp: 16 20 18 17   Temp: 98.3 F (36.8 C) 98.8 F (37.1 C) 98 F (36.7 C) 97.7 F (36.5 C)  TempSrc: Oral Oral Oral Oral  SpO2: 100% 100% 100% 98%  Weight:      Height:        PHYSICAL EXAM General: Chronically ill-appearing elderly female, well nourished, in no acute distress. HEENT: Normocephalic and atraumatic. Neck: No  JVD.  Lungs: Normal respiratory effort on room air. Clear bilaterally to auscultation. No wheezes, crackles, rhonchi.  Heart: HRRR. Normal S1 and S2 without gallops.  + Murmur. Abdomen: Non-distended appearing.  Msk: Normal strength and tone for age. Extremities: Warm and well perfused. No clubbing, cyanosis.  +2 edema.  Neuro: Alert and oriented X 3. Psych: Answers questions appropriately.   Labs: Basic Metabolic Panel: Recent Labs    08/21/24 0623 08/22/24 0634  NA 140 138  K 3.6 3.7  CL 105 103  CO2 28 29  GLUCOSE 92 86  BUN 13 12  CREATININE 0.59 0.60  CALCIUM 8.7* 8.9   Liver Function Tests: Recent Labs    08/21/24 0623  AST 54*  ALT 12  ALKPHOS 90  BILITOT 0.4  PROT 5.5*  ALBUMIN 3.0*   No results for input(s): LIPASE, AMYLASE in the last 72 hours. CBC: No results for input(s): WBC, NEUTROABS, HGB, HCT, MCV, PLT in the last 72 hours.  Cardiac Enzymes: No results for input(s): CKTOTAL, CKMB, CKMBINDEX, TROPONINIHS in the last 72 hours.  BNP: No results for input(s): BNP in the last 72 hours. D-Dimer: No results for input(s): DDIMER in the last 72 hours. Hemoglobin A1C: No results for input(s): HGBA1C in the last 72 hours. Fasting Lipid Panel: No results for input(s): CHOL, HDL,  LDLCALC, TRIG, CHOLHDL, LDLDIRECT in the last 72 hours. Thyroid  Function Tests: No results for input(s): TSH, T4TOTAL, T3FREE, THYROIDAB in the last 72 hours.  Invalid input(s): FREET3  Anemia Panel: No results for input(s): VITAMINB12, FOLATE, FERRITIN, TIBC, IRON , RETICCTPCT in the last 72 hours.    Radiology: ECHOCARDIOGRAM COMPLETE Result Date: 08/19/2024    ECHOCARDIOGRAM REPORT   Patient Name:   Michele Brewer Date of Exam: 08/19/2024 Medical Rec #:  [id number]     Height:       64.0 in Accession #:    [id number]    Weight:       140.0 lb Date of Birth:  March 14, 1951     BSA:          1.681 m Patient Age:    73 years      BP:           111/54 mmHg Patient Gender: F             HR:           91 bpm. Exam Location:  [facility] Procedure: 2D Echo, Cardiac Doppler and Color Doppler (Both Spectral and Color            Flow Doppler were utilized during procedure). Indications:     Syncope R55  History:         Patient has no prior history of Echocardiogram examinations.                  Signs/Symptoms:Syncope.  Sonographer:     Ashley McNeely-Sloane Referring Phys:  [id number] DAVED BROCKS [facility] Diagnosing Phys: Lonni Hanson MD IMPRESSIONS  1. Left ventricular ejection fraction, by estimation, is 60 to 65%. The left ventricle has normal function. The left ventricle has no regional wall motion abnormalities. Left ventricular diastolic parameters were normal.  2. Right ventricular systolic function is normal. The right ventricular size is normal. There is mildly elevated pulmonary artery systolic pressure.  3. Right atrial size was mildly dilated.  4. The mitral valve is degenerative. Moderate mitral valve regurgitation. No evidence of mitral stenosis.  5. Tricuspid valve regurgitation is moderate.  6. The aortic valve  is tricuspid. There is mild thickening of the aortic valve. Aortic valve regurgitation is mild to moderate. No aortic stenosis is present.  7. The inferior vena cava is  dilated in size with <50% respiratory variability, suggesting right atrial pressure of 15 mmHg. FINDINGS  Left Ventricle: Left ventricular ejection fraction, by estimation, is 60 to 65%. The left ventricle has normal function. The left ventricle has no regional wall motion abnormalities. Global longitudinal strain performed but not reported based on interpreter judgement due to suboptimal tracking. The left ventricular internal cavity size was normal in size. There is no left ventricular hypertrophy. Left ventricular diastolic parameters were normal. Right Ventricle: The right ventricular size is normal. No increase in right ventricular wall thickness. Right ventricular systolic function is normal. There is mildly elevated pulmonary artery systolic pressure. The tricuspid regurgitant velocity is 2.37  m/s, and with an assumed right atrial pressure of 15 mmHg, the estimated right ventricular systolic pressure is 37.5 mmHg. Left Atrium: Left atrial size was normal in size. Right Atrium: Right atrial size was mildly dilated. Pericardium: There is no evidence of pericardial effusion. Mitral Valve: The mitral valve is degenerative in appearance. There is mild thickening of the mitral valve leaflet(s). There is mild calcification of the mitral valve leaflet(s). Moderate mitral valve regurgitation. No evidence of mitral valve stenosis. MV peak gradient, 6.8 mmHg. The mean mitral valve gradient is 3.0 mmHg. Tricuspid Valve: The tricuspid valve is normal in structure. Tricuspid valve regurgitation is moderate. Aortic Valve: The aortic valve is tricuspid. There is mild thickening of the aortic valve. Aortic valve regurgitation is mild to moderate. Aortic regurgitation PHT measures 493 msec. No aortic stenosis is present. Aortic valve mean gradient measures 4.0 mmHg. Aortic valve peak gradient measures 7.5 mmHg. Aortic valve area, by VTI measures 2.98 cm. Pulmonic Valve: The pulmonic valve was normal in structure. Pulmonic  valve regurgitation is mild. Aorta: The aortic root and ascending aorta are structurally normal, with no evidence of dilitation. Pulmonary Artery: The pulmonary artery is of normal size. Venous: The inferior vena cava is dilated in size with less than 50% respiratory variability, suggesting right atrial pressure of 15 mmHg. IAS/Shunts: The interatrial septum was not well visualized. Additional Comments: 3D was performed not requiring image post processing on an independent workstation and was indeterminate.  LEFT VENTRICLE PLAX 2D LVIDd:         4.20 cm     Diastology LVIDs:         2.40 cm     LV e' medial:    9.32 cm/s LV PW:         0.80 cm     LV E/e' medial:  9.6 LV IVS:        0.90 cm     LV e' lateral:   11.30 cm/s LVOT diam:     2.10 cm     LV E/e' lateral: 7.9 LV SV:         83 LV SV Index:   49 LVOT Area:     3.46 cm LV IVRT:       77 msec  LV Volumes (MOD) LV vol d, MOD A2C: 81.2 ml LV vol d, MOD A4C: 90.6 ml LV vol s, MOD A2C: 15.3 ml LV vol s, MOD A4C: 25.6 ml LV SV MOD A2C:     65.9 ml LV SV MOD A4C:     90.6 ml LV SV MOD BP:      67.9 ml RIGHT VENTRICLE  IVC RV Basal diam:  3.30 cm     IVC diam: 2.40 cm RV Mid diam:    3.10 cm RV S prime:     19.70 cm/s  PULMONARY VEINS TAPSE (M-mode): 3.1 cm      A Reversal Duration: 173.00 msec                             A Reversal Velocity: 36.70 cm/s                             Diastolic Velocity:  38.20 cm/s                             S/D Velocity:        1.90                             Systolic Velocity:   72.40 cm/s LEFT ATRIUM             Index        RIGHT ATRIUM           Index LA diam:        3.90 cm 2.32 cm/m   RA Area:     19.50 cm LA Vol (A2C):   52.0 ml 30.93 ml/m  RA Volume:   59.40 ml  35.33 ml/m LA Vol (A4C):   34.5 ml 20.52 ml/m LA Biplane Vol: 43.4 ml 25.81 ml/m  AORTIC VALVE                    PULMONIC VALVE AV Area (Vmax):    3.39 cm     PV Vmax:          0.98 m/s AV Area (Vmean):   3.09 cm     PV Vmean:         70.600 cm/s  AV Area (VTI):     2.98 cm     PV VTI:           0.198 m AV Vmax:           137.00 cm/s  PV Peak grad:     3.8 mmHg AV Vmean:          94.500 cm/s  PV Mean grad:     2.0 mmHg AV VTI:            0.278 m      PR End Diast Vel: 3.58 msec AV Peak Grad:      7.5 mmHg     RVOT Peak grad:   2 mmHg AV Mean Grad:      4.0 mmHg LVOT Vmax:         134.00 cm/s LVOT Vmean:        84.300 cm/s LVOT VTI:          0.239 m LVOT/AV VTI ratio: 0.86 AI PHT:            493 msec  AORTA Ao Root diam: 2.80 cm Ao Asc diam:  2.60 cm MITRAL VALVE                  TRICUSPID VALVE MV Area (PHT): 3.72 cm       TV Peak grad:   30.0 mmHg MV Area VTI:   2.84 cm  TV Vmax:        2.74 m/s MV Peak grad:  6.8 mmHg       TR Peak grad:   22.5 mmHg MV Mean grad:  3.0 mmHg       TR Mean grad:   17.0 mmHg MV Vmax:       1.30 m/s       TR Vmax:        237.00 cm/s MV Vmean:      78.4 cm/s      TR Vmean:       199.0 cm/s MV Decel Time: 204 msec MR Peak grad:    95.8 mmHg    SHUNTS MR Mean grad:    76.0 mmHg    Systemic VTI:  0.24 m MR Vmax:         489.50 cm/s  Systemic Diam: 2.10 cm MR Vmean:        424.0 cm/s   Pulmonic VTI:  0.180 m MR PISA:         1.90 cm MR PISA Eff ROA: 15 mm MR PISA Radius:  0.55 cm MV E velocity: 89.10 cm/s MV A velocity: 118.00 cm/s MV E/A ratio:  0.76 Lonni End MD Electronically signed by Lonni Hanson MD Signature Date/Time: 08/19/2024/12:16:14 PM    Final    DG Tibia/Fibula Left Result Date: 08/18/2024 CLINICAL DATA:  Fall.  Left-sided pain. EXAM: LEFT TIBIA AND FIBULA - 2 VIEW COMPARISON:  None Available. FINDINGS: No acute fracture involving the left tibia or fibula. Calcaneal spurring. Bilateral soft tissue swelling at the left ankle. Left ankle is located. Left knee is located. Degenerative changes in left knee. IMPRESSION: 1. No acute bone abnormality to the left tibia or fibula. 2. Soft tissue swelling at the left ankle. Electronically Signed   By: Juliene Balder M.D.   On: 08/18/2024 13:41   DG HIP UNILAT  WITH PELVIS 2-3 VIEWS LEFT Result Date: 08/18/2024 CLINICAL DATA:  Left-sided pain.  Status post fall. EXAM: DG HIP (WITH OR WITHOUT PELVIS) 2-3V LEFT COMPARISON:  06/17/2024 FINDINGS: Status post right hip arthroplasty. The right femoral stem is not completely imaged. Pelvic bony ring is intact. Left hip is located without acute fracture. IMPRESSION: 1. No acute abnormality to the left hip. 2. Status post right hip arthroplasty. Electronically Signed   By: Juliene Balder M.D.   On: 08/18/2024 13:39   DG Humerus Left Result Date: 08/18/2024 CLINICAL DATA:  Fall.  Left-sided pain. EXAM: LEFT HUMERUS - 2+ VIEW COMPARISON:  None Available. FINDINGS: Left humerus is intact without a fracture. Mild elevation of the distal left clavicle. Degenerative changes at the left [facility] joint. Evidence for old left rib fractures. Limited evaluation of the left elbow. IMPRESSION: 1. No acute bone abnormality to the left humerus. 2. Mild elevation of the distal left clavicle. Findings could be associated with an age-indeterminate AC joint injury. Recommend clinical correlation in this area. Electronically Signed   By: Juliene Balder M.D.   On: 08/18/2024 13:38   CT Head Wo Contrast Result Date: 08/18/2024 EXAM: CT HEAD WITHOUT 08/18/2024 12:00:32 PM TECHNIQUE: CT of the head was performed without the administration of intravenous contrast. Automated exposure control, iterative reconstruction, and/or weight based adjustment of the mA/kV was utilized to reduce the radiation dose to as low as reasonably achievable. COMPARISON: 06/02/2023 CLINICAL HISTORY: Head trauma, minor (Age >= 65y) FINDINGS: BRAIN AND VENTRICLES: No acute intracranial hemorrhage. No mass effect or midline shift. No extra-axial fluid collection. No evidence  of acute infarct. No hydrocephalus. Cerebral ventricle sizes concordant with degree of cerebral volume loss. ORBITS: No acute abnormality. SINUSES AND MASTOIDS: No acute abnormality. SOFT TISSUES AND SKULL: No acute  skull fracture. No acute soft tissue abnormality. IMPRESSION: 1. No acute intracranial abnormality related to head trauma. 2. Cerebral ventricle sizes concordant with degree of cerebral volume loss. Electronically signed by: Evalene Coho MD 08/18/2024 12:13 PM EST RP Workstation: [id number]   CT Cervical Spine Wo Contrast Result Date: 08/18/2024 CLINICAL DATA:  Neck trauma (Age >= 65y) EXAM: CT CERVICAL SPINE WITHOUT CONTRAST TECHNIQUE: Multidetector CT imaging of the cervical spine was performed without intravenous contrast. Multiplanar CT image reconstructions were also generated. RADIATION DOSE REDUCTION: This exam was performed according to the departmental dose-optimization program which includes automated exposure control, adjustment of the mA and/or kV according to patient size and/or use of iterative reconstruction technique. COMPARISON:  MRI cervical spine 07/15/2022. CT cervical spine 02/03/2021. FINDINGS: Alignment: Chronic reversal of the usual cervical lordosis, similar to previous CT. No focal angulation or significant listhesis. Skull base and vertebrae: No evidence of acute cervical spine fracture or traumatic subluxation. Soft tissues and spinal canal: No prevertebral fluid or swelling. No visible canal hematoma. Disc levels: Chronic spondylosis with disc space narrowing, uncinate spurring and facet hypertrophy. Chronic interfacetal ankylosis on the right at C2-3. Chronic foraminal narrowing, greatest on the right at C4-5 and on the left at C6-7. No large disc herniation identified. Upper chest: Clear lung apices. Other: Asymmetric TMJ degenerative changes on the left. IMPRESSION: 1. No evidence of acute cervical spine fracture, traumatic subluxation or static signs of instability. 2. Chronic cervical spondylosis as described. Electronically Signed   By: Elsie Perone M.D.   On: 08/18/2024 12:12   US  Venous Img Lower Bilateral Result Date: 08/02/2024 CLINICAL DATA:  Leg swelling. EXAM:  BILATERAL LOWER EXTREMITY VENOUS DOPPLER ULTRASOUND TECHNIQUE: Gray-scale sonography with compression, as well as color and duplex ultrasound, were performed to evaluate the deep venous system(s) from the level of the common femoral vein through the popliteal and proximal calf veins. COMPARISON:  07/14/2022. FINDINGS: VENOUS Normal compressibility of the common femoral, superficial femoral, and popliteal veins, as well as the visualized calf veins. Visualized portions of profunda femoral vein and great saphenous vein unremarkable. No filling defects to suggest DVT on grayscale or color Doppler imaging. Doppler waveforms show normal direction of venous flow, normal respiratory plasticity and response to augmentation. Limited views of the contralateral common femoral vein are unremarkable. OTHER Small amount of fluid is noted in the popliteal fossa on the left. Limitations: none IMPRESSION: 1. No evidence of deep venous thrombosis bilaterally. 2. Small amount of fluid in the popliteal fossa on the left. Electronically Signed   By: Leita Birmingham M.D.   On: 08/02/2024 14:40   DG Hip Unilat W or Wo Pelvis 2-3 Views Right Result Date: 08/02/2024 CLINICAL DATA:  Fall.  Pain. EXAM: DG HIP (WITH OR WITHOUT PELVIS) 2-3V RIGHT COMPARISON:  06/17/2024 FINDINGS: Status post right hip replacement. No evidence for periprosthetic fracture. No dislocation. Stable appearance of the right superior inferior pubic rami without definite evidence for acute fracture. SI joints and symphysis pubis unremarkable. Degenerative changes noted lower lumbar spine. IMPRESSION: Status post right hip replacement without evidence for periprosthetic fracture or dislocation. Electronically Signed   By: Camellia Candle M.D.   On: 08/02/2024 08:34    ECHO as above  TELEMETRY (personally reviewed): sinus rhythm PVCs rate 80s  EKG (personally reviewed): SR rate 107  bpm but with significant artifact  Data reviewed by me 08/22/2024: last 24h vitals  tele labs imaging I/O ED provider note, admission H&P, hospitalist progress note  Principal Problem:   Syncope Active Problems:   Fall   Rhabdomyolysis   Parkinson disease (HCC)   Ambulatory dysfunction   Malnutrition of moderate degree   UTI (urinary tract infection)   Chronic anemia    ASSESSMENT AND PLAN:  Michele Brewer is a 73 y.o. female  with a past medical history of Parkinson's disease, anemia who presented to the ED on 08/18/2024 for fall, possible syncope. Reports significant LE edema for 2-3 weeks and echo revealed valvular dysfunction. Cardiology was consulted for further evaluation.   # Generalized edema # Valvular heart disease # Parkinson's disease Patient initially presented for evaluation after fall, also reporting 2 to 3 weeks of generalized body swelling.  Echo was done and this revealed EF 60-65%, no wall motion abnormalities, normal diastolic function, moderate MR, moderate TR, mild to moderate AR.  Received 1 dose of IV Lasix  20 mg today with good UOP reported by patient. - Will continue IV lasix  at 20 mg BID. Would benefit from leg wraps.  - Patient does not have heart failure based on echo findings, valvular regurgitation can contribute to edema but feel that this is likely multifactorial. - BP borderline, continue to monitor.   This patient's plan of care was discussed and created with Dr. Florencio and he is in agreement.  Signed: Danita Bloch, PA-C  08/22/2024, 10:41 AM [facility] Cardiology

## 2024-08-22 NOTE — Assessment & Plan Note (Addendum)
 Syncope versus fall. History of ambulatory dysfunction. Increase midodrine  to 5 mg 3 times daily.  Continue working with physical therapy.  Physical therapy recommending rehab but insurance company likely will not approve.  Patient unable to get out of the bed on her own.  Unsafe discharge home currently.  Continue to try to get up on her home while staff is in the room.

## 2024-08-22 NOTE — Plan of Care (Signed)
  Problem: Activity: Goal: Risk for activity intolerance will decrease Outcome: Progressing   Problem: Elimination: Goal: Will not experience complications related to urinary retention Outcome: Progressing   Problem: Safety: Goal: Ability to remain free from injury will improve Outcome: Progressing   

## 2024-08-22 NOTE — Consult Note (Addendum)
 WOC Nurse Consult Note: Requested to apply bilat Una boots. It is best practice to have an ABI performed, one was ordered but they were not able to perform it before I had to apply the compression wraps prior to the weekend when [facility] nurses are not available, so after discussion via Secure chat with the primary team it was cancelled.   Pt 's perfusion status to bilat feet appears to be good, warm to touch with good pedal pulses palpable.  Bilat legs with generalized edema, no open wounds or drainage.  [employer] as requested.   Topical treatment orders provided for bedside nurses to perform as follows: Leave Una boots in place, WOC nurse will change Q Fri while in the hospital.  Pt will need Una boots to be changed weekly afterr discharge from the hospital.   Thank-you,  Stephane Fought MSN, RN, CWOCN, CWCN-AP, CNS Contact Mon-Fri 0700-1500: [phone]

## 2024-08-22 NOTE — Care Management Important Message (Signed)
 Important Message  Patient Details  Name: Michele Brewer MRN: [id number] Date of Birth: 06-16-1951   Important Message Given:  Yes - Medicare IM     Kalieb Freeland W, CMA 08/22/2024, 12:39 PM

## 2024-08-22 NOTE — Assessment & Plan Note (Signed)
 Continue supplements

## 2024-08-22 NOTE — Assessment & Plan Note (Addendum)
 Feels like she is leaning over when moving around.  MRI of the brain negative.

## 2024-08-22 NOTE — TOC Progression Note (Signed)
 Transition of Care [facility]) - Progression Note    Patient Details  Name: Michele Brewer MRN: [id number] Date of Birth: [birth year]  [facility]) CM/SW Contact  Dalia GORMAN Fuse, RN Phone Number: 08/22/2024, 1:25 PM  Clinical Narrative:    TOC received message from Tammy at [facility]. The MD reviewed and he doesn't feel the patient is appropriate for SNF. She is close to where she was when she discharged from [facility] in early November. He offered a P2P which should be completed by 5:00 PM today. TOC sent message to the MD to provide number for P2P, today is his 1st day with this patient.   TOC spoke with the patient in the room and left her know that it sounds like [facility] is going to issue a denial for STR. The patient advised that she would go home with [facility] PT/OT. She believes she used [facility] before and would like to use them again if possible.  TOC will continue to follow.   Expected Discharge Plan: Skilled Nursing Facility Barriers to Discharge: Continued Medical Work up               Expected Discharge Plan and Services   Discharge Planning Services: CM Consult   Living arrangements for the past 2 months: Single Family Home                                       Social Drivers of Health (SDOH) Interventions SDOH Screenings   Food Insecurity: No Food Insecurity (08/18/2024)  Housing: Low Risk  (08/18/2024)  Transportation Needs: No Transportation Needs (08/18/2024)  Utilities: Not At Risk (08/18/2024)  Financial Resource Strain: Low Risk  (06/12/2024)   Received from [facility]  Social Connections: Moderately Isolated (08/18/2024)  Tobacco Use: Low Risk  (08/18/2024)    Readmission Risk Interventions     No data to display

## 2024-08-22 NOTE — Assessment & Plan Note (Addendum)
 Last hemoglobin 10.7, continue B12 supplementation

## 2024-08-22 NOTE — Assessment & Plan Note (Signed)
 Ruled out  Urine culture negative

## 2024-08-22 NOTE — Assessment & Plan Note (Signed)
 Bilaterally.  On IV Lasix .  Will get ABIs and wound care nurse to evaluate for compression wraps.

## 2024-08-22 NOTE — Assessment & Plan Note (Signed)
On Sinemet 

## 2024-08-23 DIAGNOSIS — W19XXXS Unspecified fall, sequela: Secondary | ICD-10-CM | POA: Diagnosis not present

## 2024-08-23 DIAGNOSIS — T796XXS Traumatic ischemia of muscle, sequela: Secondary | ICD-10-CM | POA: Diagnosis not present

## 2024-08-23 DIAGNOSIS — R55 Syncope and collapse: Secondary | ICD-10-CM | POA: Diagnosis not present

## 2024-08-23 DIAGNOSIS — R262 Difficulty in walking, not elsewhere classified: Secondary | ICD-10-CM | POA: Diagnosis not present

## 2024-08-23 LAB — CK: Total CK: 204 U/L (ref 38–234)

## 2024-08-23 LAB — BASIC METABOLIC PANEL WITH GFR
Anion gap: 7 (ref 5–15)
BUN: 16 mg/dL (ref 8–23)
CO2: 30 mmol/L (ref 22–32)
Calcium: 9.1 mg/dL (ref 8.9–10.3)
Chloride: 103 mmol/L (ref 98–111)
Creatinine, Ser: 0.73 mg/dL (ref 0.44–1.00)
GFR, Estimated: >60 mL/min (ref >60)
Glucose, Bld: 85 mg/dL (ref 70–99)
Potassium: 3.9 mmol/L (ref 3.5–5.1)
Sodium: 140 mmol/L (ref 135–145)

## 2024-08-23 LAB — HEMOGLOBIN: Hemoglobin: 10 g/dL — ABNORMAL LOW (ref 12.0–15.0)

## 2024-08-23 NOTE — Progress Notes (Signed)
 Patient ID: Michele Brewer, female   DOB: [birth year], 73 y.o.   MRN: [id number] [facility] Cardiology    SUBJECTIVE: Patient states she feels reasonably well she is not sure why he passed out does not remember denies any palpitations or tachycardia no significant chest pain resting comfortably he has history of Parkinson's currently does not follow-up with neurology states to be compliant with her carbidopa    Vitals:   08/23/24 0447 08/23/24 0451 08/23/24 0454 08/23/24 0857  BP: (!) 113/51 113/62 (!) 138/104 108/65  Pulse: 72 80 81 82  Resp: 20   14  Temp:    97.8 F (36.6 C)  TempSrc:    Oral  SpO2: 99% 99% 100% 98%  Weight:      Height:         Intake/Output Summary (Last 24 hours) at 08/23/2024 1307 Last data filed at 08/23/2024 0900 Gross per 24 hour  Intake 480 ml  Output --  Net 480 ml      PHYSICAL EXAM  General: Well developed, well nourished, in no acute distress HEENT:  Normocephalic and atramatic Neck:  No JVD.  Lungs: Clear bilaterally to auscultation and percussion. Heart: HRRR . Normal S1 and S2 without gallops or murmurs.  Abdomen: Bowel sounds are positive, abdomen soft and non-tender  Msk:  Back normal, normal gait. Normal strength and tone for age. Extremities: No clubbing, cyanosis or edema.   Neuro: Alert and oriented X 3. Psych:  Good affect, responds appropriately   LABS: Basic Metabolic Panel: Recent Labs    08/22/24 0634 08/23/24 0653  NA 138 140  K 3.7 3.9  CL 103 103  CO2 29 30  GLUCOSE 86 85  BUN 12 16  CREATININE 0.60 0.73  CALCIUM 8.9 9.1   Liver Function Tests: Recent Labs    08/21/24 0623  AST 54*  ALT 12  ALKPHOS 90  BILITOT 0.4  PROT 5.5*  ALBUMIN 3.0*   No results for input(s): LIPASE, AMYLASE in the last 72 hours. CBC: Recent Labs    08/23/24 0653  HGB 10.0*   Cardiac Enzymes: Recent Labs    08/23/24 0653  CKTOTAL 204   BNP: Invalid input(s): POCBNP D-Dimer: No results for input(s): DDIMER in the  last 72 hours. Hemoglobin A1C: No results for input(s): HGBA1C in the last 72 hours. Fasting Lipid Panel: No results for input(s): CHOL, HDL, LDLCALC, TRIG, CHOLHDL, LDLDIRECT in the last 72 hours. Thyroid  Function Tests: No results for input(s): TSH, T4TOTAL, T3FREE, THYROIDAB in the last 72 hours.  Invalid input(s): FREET3 Anemia Panel: No results for input(s): VITAMINB12, FOLATE, FERRITIN, TIBC, IRON , RETICCTPCT in the last 72 hours.  MR BRAIN WO CONTRAST Result Date: 08/22/2024 EXAM: MRI BRAIN WITHOUT CONTRAST 08/22/2024 12:17:51 PM TECHNIQUE: Multiplanar multisequence MRI of the head/brain was performed without the administration of intravenous contrast. COMPARISON: MR Head without IV contrast 07/15/2022 and CT head 08/18/2024. CLINICAL HISTORY: 73 year old female presenting with leaning over to one side. FINDINGS: BRAIN AND VENTRICLES: Brain volume has not significantly changed from the previous MRI. No acute infarct. No intracranial hemorrhage. No mass. No midline shift. No hydrocephalus. The sella is unremarkable. Normal flow voids. Chronic intracranial artery tortuosity. Elnor and white matter signal has not significantly changed since 2023. Patchy chronic lacunar infarct at the anterior right basal ganglia. Mild contralateral left basal ganglia chronic T2 heterogeneity. Subtle chronic lacunar infarct of the right thalamus. No cortical encephalomalacia. No chronic cerebral blood products. Otherwise normal for age gray and white matter signal. ORBITS:  No acute abnormality. SINUSES AND MASTOIDS: Visible internal auditory structures appear grossly normal. Paranasal sinuses, tympanic cavities, and mastoids are well aerated. BONES AND SOFT TISSUES: Chronic cervical spine degeneration. Normal marrow signal. No acute soft tissue abnormality. IMPRESSION: 1. No acute intracranial abnormality. 2. 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 Electronically signed by: Helayne Hurst MD 08/22/2024 12:56 PM EST RP Workstation: [id number]     Echo preserved left ventricular function EF 50 to 55% significant tricuspid aortic insufficiency  TELEMETRY: Sinus rhythm occasional PVCs rate of 75:  ASSESSMENT AND PLAN:  Principal Problem:   Syncope Active Problems:   Fall   Rhabdomyolysis   Parkinson disease (HCC)   Ambulatory dysfunction   Malnutrition of moderate degree   UTI (urinary tract infection)   Chronic anemia   Lower extremity edema   Weakness    Plan Generalized edema continue gentle diuresis Urinary tract infection agree with antibiotic therapy to help with infection Parkinson syndrome continue carbidopa  follow-up with neurology Syncope unclear etiology consider further evaluation outpatient monitoring Evidence of aortic insufficiency moderately as well as tricuspid insufficiency unlikely to benefit from intervention continue medical therapy Borderline hypertension continue medical therapy blood pressure goal less than 130/80 [name], MD 08/23/2024 1:07 PM

## 2024-08-23 NOTE — Plan of Care (Signed)
  Problem: Activity: Goal: Risk for activity intolerance will decrease Outcome: Progressing   Problem: Safety: Goal: Ability to remain free from injury will improve Outcome: Progressing   

## 2024-08-23 NOTE — Progress Notes (Signed)
 Progress Note   Patient: Michele Brewer FMW:[id number] DOB: 03/20/1951 DOA: 08/18/2024     4 DOS: the patient was seen and examined on 08/23/2024   Brief hospital course: Partly taken from H&P.  Michele Brewer is a 73 y.o. female with medical history significant of Parkinson's disease, Presents to the ED for evaluation after a fall at home. She is unable to recall what happened.  In the emergency department, she was hemodynamically stable.  No leukocytosis.  CK elevated at 728, CT head, C-spine, and x-rays of the left humerus, left tib-fib, left hip all without acute abnormality.  Her UA was indicative of a UTI.  She was unable to ambulate in the emergency department.   She was started on ceftriaxone , urine cultures were obtained.  Was admitted for concern of fall versus syncope with ambulatory dysfunction.  12/2: Vital stable, slight worsening of CK to 1713, continuing IV fluid, B12 of 324, urine cultures pending. 12/5.  Peer to peer done and does not look promising for rehab.  Patient states she is falling over to the left.  MRI of the brain negative for acute event.  Unna boot wraps. 12/6.  Unsafe discharge home since patient was unable to get out of the bed on her own.  Continue working with PT and OT to try to get her stronger while here.  Assessment and Plan: * Syncope Syncope versus fall. History of ambulatory dysfunction. Continue low-dose midodrine .  Continue working with physical therapy.  Physical therapy recommending rehab but insurance company likely will not approve.  Patient unable to get out of the bed on her own.  Unsafe discharge home currently.   Rhabdomyolysis CK at 1713 on presentation.  Likely secondary to fall.  Last CK2 04. Received IV fluids during hospital course  UTI (urinary tract infection) Ruled out.  Urine culture negative.  Malnutrition of moderate degree Continue supplements  Parkinson disease (HCC) On Sinemet   Chronic anemia Last hemoglobin 10.0,  continue B12 supplementation  Weakness Feels like she is leaning over when moving around.  MRI of the brain negative.  To briefly get out of bed on her own.  Lower extremity edema Bilaterally.  On IV Lasix .  Unna boot placement by wound care nurse, will need to change weekly        Subjective: Patient states she cannot get out of the bed by herself.  Unsafe discharge home.  Admitted 5 days ago with weakness and fall.  Physical Exam: Vitals:   08/23/24 0447 08/23/24 0451 08/23/24 0454 08/23/24 0857  BP: (!) 113/51 113/62 (!) 138/104 108/65  Pulse: 72 80 81 82  Resp: 20   14  Temp:    97.8 F (36.6 C)  TempSrc:    Oral  SpO2: 99% 99% 100% 98%  Weight:      Height:       Physical Exam HENT:     Head: Normocephalic.  Eyes:     General: Lids are normal.     Conjunctiva/sclera: Conjunctivae normal.  Cardiovascular:     Rate and Rhythm: Normal rate and regular rhythm.     Heart sounds: Normal heart sounds, S1 normal and S2 normal.  Pulmonary:     Breath sounds: No decreased breath sounds, wheezing, rhonchi or rales.  Abdominal:     Palpations: Abdomen is soft.     Tenderness: There is no abdominal tenderness.  Musculoskeletal:     Right lower leg: Swelling present.     Left lower leg: Swelling present.  Skin:    General: Skin is warm.     Comments: Legs covered with Unna boots  Neurological:     Mental Status: She is alert.     Data Reviewed: Creatinine 0.73, electrolytes normal range, hemoglobin 10   Disposition: Status is: Inpatient Remains inpatient appropriate because: Unsafe discharge home with 49 year old mother.  Patient unable to get out of the bed on her own.  Peer to peer rejected rehab.  Will need to be a little stronger prior to going home.  Planned Discharge Destination: Home with home health    Time spent: 29 minutes I rewrapped one of the Unna boot wraps that she came loose Author: Charlie Patterson, MD 08/23/2024 12:20 PM  For on call review  [email or web address].

## 2024-08-24 DIAGNOSIS — I959 Hypotension, unspecified: Secondary | ICD-10-CM | POA: Diagnosis not present

## 2024-08-24 DIAGNOSIS — R262 Difficulty in walking, not elsewhere classified: Secondary | ICD-10-CM | POA: Diagnosis not present

## 2024-08-24 DIAGNOSIS — I951 Orthostatic hypotension: Secondary | ICD-10-CM

## 2024-08-24 DIAGNOSIS — R600 Localized edema: Secondary | ICD-10-CM | POA: Diagnosis not present

## 2024-08-24 DIAGNOSIS — R55 Syncope and collapse: Secondary | ICD-10-CM | POA: Diagnosis not present

## 2024-08-24 LAB — VITAMIN C: Vitamin C: 0.6 mg/dL (ref 0.4–2.0)

## 2024-08-24 MED ORDER — MIDODRINE HCL 5 MG PO TABS
5.0000 mg | ORAL_TABLET | Freq: Three times a day (TID) | ORAL | Status: DC
  Administered 2024-08-24 – 2024-08-25 (×3): 5 mg via ORAL
  Filled 2024-08-24 (×3): qty 1

## 2024-08-24 MED ORDER — FUROSEMIDE 20 MG PO TABS
20.0000 mg | ORAL_TABLET | Freq: Two times a day (BID) | ORAL | Status: DC
  Administered 2024-08-24 – 2024-08-25 (×2): 20 mg via ORAL
  Filled 2024-08-24 (×2): qty 1

## 2024-08-24 NOTE — Progress Notes (Signed)
 Mobility Specialist - Progress Note  08/24/24 1303  Mobility  Activity Ambulated with assistance;Stood at bedside  Level of Assistance Standby assist, set-up cues, supervision of patient - no hands on  Assistive Device Front wheel walker  Distance Ambulated (ft) 200 ft  Range of Motion/Exercises Active  Activity Response Tolerated well  Mobility Referral Yes  Mobility visit 1 Mobility  Mobility Specialist Start Time (ACUTE ONLY) 1216  Mobility Specialist Stop Time (ACUTE ONLY) 1238  Mobility Specialist Time Calculation (min) (ACUTE ONLY) 22 min   Pt resting in recliner on RA upon entry. Pt STS and ambulates to hallway around NS SBA with RW. Pt endorses no lightheadedness or dizziness during ambulation. Pt gait was slow but moderately stable. Pt returned to recliner and left with needs in reach.   Guido Rumble Mobility Specialist 08/24/24, 1:15 PM

## 2024-08-24 NOTE — Plan of Care (Signed)

## 2024-08-24 NOTE — Progress Notes (Signed)
 Progress Note   Patient: Michele Brewer FMW:[id number] DOB: 07-29-1951 DOA: 08/18/2024     5 DOS: the patient was seen and examined on 08/24/2024   Brief hospital course: Partly taken from H&P.  Delight Bickle is a 73 y.o. female with medical history significant of Parkinson's disease, Presents to the ED for evaluation after a fall at home. She is unable to recall what happened.  In the emergency department, she was hemodynamically stable.  No leukocytosis.  CK elevated at 728, CT head, C-spine, and x-rays of the left humerus, left tib-fib, left hip all without acute abnormality.  Her UA was indicative of a UTI.  She was unable to ambulate in the emergency department.   She was started on ceftriaxone , urine cultures were obtained.  Was admitted for concern of fall versus syncope with ambulatory dysfunction.  12/2: Vital stable, slight worsening of CK to 1713, continuing IV fluid, B12 of 324, urine cultures pending. 12/5.  Peer to peer done and does not look promising for rehab.  Patient states she is falling over to the left.  MRI of the brain negative for acute event.  Unna boot wraps. 12/6.  Unsafe discharge home since patient was unable to get out of the bed on her own.  Continue working with PT and OT to try to get her stronger while here.  Assessment and Plan: * Syncope Syncope versus fall. History of ambulatory dysfunction. Increase midodrine  to 5 mg 3 times daily.  Continue working with physical therapy.  Physical therapy recommending rehab but insurance company likely will not approve.  Patient unable to get out of the bed on her own.  Unsafe discharge home currently.  Continue to try to get up on her home while staff is in the room.   Lower extremity edema Bilaterally.  Change Lasix  to p.o.  Unna boot placement by wound care nurse, will need to change weekly  Rhabdomyolysis CK at 1713 on presentation.  Likely secondary to fall.  Last CK 204. Received IV fluids during hospital  course  Malnutrition of moderate degree Continue supplements  Parkinson disease (HCC) On Sinemet   Chronic anemia Last hemoglobin 10.0, continue B12 supplementation  UTI (urinary tract infection) Ruled out.  Urine culture negative.  Weakness Feels like she is leaning over when moving around.  MRI of the brain negative.  To briefly get out of bed on her own.        Subjective: Patient feels a little bit better.  States she got in the chair with the nurse today.  Hoping to get stronger prior to going home.  Admitted with a fall.  Physical Exam: Vitals:   08/23/24 1700 08/23/24 1900 08/24/24 0458 08/24/24 0832  BP: 114/63 (!) 96/53 (!) 110/54 (!) 95/51  Pulse:  77 81 80  Resp:  15 18 14   Temp:  97.7 F (36.5 C) 98 F (36.7 C) 97.9 F (36.6 C)  TempSrc:  Oral  Oral  SpO2:  99% 97% 99%  Weight:      Height:       Physical Exam HENT:     Head: Normocephalic.  Eyes:     General: Lids are normal.     Conjunctiva/sclera: Conjunctivae normal.  Cardiovascular:     Rate and Rhythm: Normal rate and regular rhythm.     Heart sounds: Normal heart sounds, S1 normal and S2 normal.  Pulmonary:     Breath sounds: No decreased breath sounds, wheezing, rhonchi or rales.  Abdominal:  Palpations: Abdomen is soft.     Tenderness: There is no abdominal tenderness.  Musculoskeletal:     Right lower leg: Swelling present.     Left lower leg: Swelling present.  Skin:    General: Skin is warm.     Comments: Legs covered with Unna boots  Neurological:     Mental Status: She is alert.     Data Reviewed: Labs creatinine 0.73, last CK 204, last hemoglobin 10  Disposition: Status is: Inpatient Remains inpatient appropriate because: Continue to try to get out of bed when any staff nephro comes along.  Change Lasix  over to p.o. unsafe discharge patient unable to get out of the bed on her own currently  Planned Discharge Destination: Home with Home Health    Time spent: 28  minutes  Author: Charlie Patterson, MD 08/24/2024 12:46 PM  For on call review [email or web address].

## 2024-08-24 NOTE — Progress Notes (Signed)
 Patient ID: Michele Brewer, female   DOB: May 07, 1951, 73 y.o.   MRN: [id number] [facility] Cardiology    SUBJECTIVE: Patient states he is doing reasonably well no chest pain no shortness of breath improved leg swelling.   Vitals:   08/23/24 1700 08/23/24 1900 08/24/24 0458 08/24/24 0832  BP: 114/63 (!) 96/53 (!) 110/54 (!) 95/51  Pulse:  77 81 80  Resp:  15 18 14   Temp:  97.7 F (36.5 C) 98 F (36.7 C) 97.9 F (36.6 C)  TempSrc:  Oral  Oral  SpO2:  99% 97% 99%  Weight:      Height:         Intake/Output Summary (Last 24 hours) at 08/24/2024 1352 Last data filed at 08/24/2024 9044 Gross per 24 hour  Intake 717 ml  Output --  Net 717 ml      PHYSICAL EXAM  General: Well developed, well nourished, in no acute distress HEENT:  Normocephalic and atramatic Neck:  No JVD.  Lungs: Clear bilaterally to auscultation and percussion. Heart: HRRR . Normal S1 and S2 without gallops or murmurs.  Abdomen: Bowel sounds are positive, abdomen soft and non-tender  Msk:  Back normal, normal gait. Normal strength and tone for age. Extremities: No clubbing, cyanosis or 2+edema.   Neuro: Alert and oriented X 3. Psych:  Good affect, responds appropriately   LABS: Basic Metabolic Panel: Recent Labs    08/22/24 0634 08/23/24 0653  NA 138 140  K 3.7 3.9  CL 103 103  CO2 29 30  GLUCOSE 86 85  BUN 12 16  CREATININE 0.60 0.73  CALCIUM 8.9 9.1   Liver Function Tests: No results for input(s): AST, ALT, ALKPHOS, BILITOT, PROT, ALBUMIN in the last 72 hours. No results for input(s): LIPASE, AMYLASE in the last 72 hours. CBC: Recent Labs    08/23/24 0653  HGB 10.0*   Cardiac Enzymes: Recent Labs    08/23/24 0653  CKTOTAL 204   BNP: Invalid input(s): POCBNP D-Dimer: No results for input(s): DDIMER in the last 72 hours. Hemoglobin A1C: No results for input(s): HGBA1C in the last 72 hours. Fasting Lipid Panel: No results for input(s): CHOL, HDL, LDLCALC,  TRIG, CHOLHDL, LDLDIRECT in the last 72 hours. Thyroid  Function Tests: No results for input(s): TSH, T4TOTAL, T3FREE, THYROIDAB in the last 72 hours.  Invalid input(s): FREET3 Anemia Panel: No results for input(s): VITAMINB12, FOLATE, FERRITIN, TIBC, IRON , RETICCTPCT in the last 72 hours.  No results found.   Echo Normal LVF 60%  TELEMETRY: Sinus tachycardia rate of 100:  ASSESSMENT AND PLAN:  Principal Problem:   Syncope Active Problems:   Fall   Rhabdomyolysis   Parkinson disease (HCC)   Ambulatory dysfunction   Malnutrition of moderate degree   UTI (urinary tract infection)   Chronic anemia   Lower extremity edema   Weakness   Hypotension    Plan Syncope radiology recommend conservative management no clear cardiac indication for syncope patient is uncontrolled with hypotension continue support with midodrine  and volume Parkinson syndrome continue carbidopa  recommend follow-up with neurology Lower extremity continue support stockings elevation wrapping and diuresis Rhabdo continue hydration for rhabdo follow-up with renal function Malnutrition advanced nutrition possibly with extra shakes follow-up with dietary Chronic anemia consider follow-up with hematology Weakness consider physical therapy to help with strength training ambulation Continue conservative medical therapy   Michele JONETTA Lovelace, MD 08/24/2024 1:52 PM

## 2024-08-24 NOTE — TOC Progression Note (Signed)
 Transition of Care [facility]) - Progression Note    Patient Details  Name: Michele Brewer MRN: [id number] Date of Birth: 12-21-1950  Transition [facility]) CM/SW Contact  Rhena Glace L Yassmin Binegar, [place] Phone Number: 08/24/2024, 1:19 PM  Clinical Narrative:     CSW met with patient to provide denial packet from her insurance provider. Options were discussed re: discharge or appeal. Patient advised that she did not want to appeal. She stated that she will discharge home. Home Health services were prearranged with [facility].   Expected Discharge Plan: Skilled Nursing Facility Barriers to Discharge: Continued Medical Work up               Expected Discharge Plan and Services   Discharge Planning Services: CM Consult   Living arrangements for the past 2 months: Single Family Home                                       Social Drivers of Health (SDOH) Interventions SDOH Screenings   Food Insecurity: No Food Insecurity (08/18/2024)  Housing: Low Risk  (08/18/2024)  Transportation Needs: No Transportation Needs (08/18/2024)  Utilities: Not At Risk (08/18/2024)  Financial Resource Strain: Low Risk  (06/12/2024)   Received from [facility]  Social Connections: Moderately Isolated (08/18/2024)  Tobacco Use: Low Risk  (08/18/2024)    Readmission Risk Interventions     No data to display

## 2024-08-25 ENCOUNTER — Other Ambulatory Visit: Payer: Self-pay

## 2024-08-25 DIAGNOSIS — I9589 Other hypotension: Secondary | ICD-10-CM

## 2024-08-25 LAB — BASIC METABOLIC PANEL WITH GFR
Anion gap: 7 (ref 5–15)
BUN: 20 mg/dL (ref 8–23)
CO2: 30 mmol/L (ref 22–32)
Calcium: 9.4 mg/dL (ref 8.9–10.3)
Chloride: 102 mmol/L (ref 98–111)
Creatinine, Ser: 0.7 mg/dL (ref 0.44–1.00)
GFR, Estimated: >60 mL/min (ref >60)
Glucose, Bld: 88 mg/dL (ref 70–99)
Potassium: 3.9 mmol/L (ref 3.5–5.1)
Sodium: 139 mmol/L (ref 135–145)

## 2024-08-25 LAB — HEMOGLOBIN: Hemoglobin: 10.7 g/dL — ABNORMAL LOW (ref 12.0–15.0)

## 2024-08-25 MED ORDER — CYANOCOBALAMIN 1000 MCG PO TABS
1000.0000 ug | ORAL_TABLET | Freq: Every day | ORAL | 0 refills | Status: AC
  Filled 2024-08-25: qty 30, 30d supply, fill #0

## 2024-08-25 MED ORDER — VITAMIN B6 100 MG PO TABS
100.0000 mg | ORAL_TABLET | Freq: Every day | ORAL | 0 refills | Status: AC
  Filled 2024-08-25: qty 30, 30d supply, fill #0

## 2024-08-25 MED ORDER — MIDODRINE HCL 5 MG PO TABS
5.0000 mg | ORAL_TABLET | Freq: Three times a day (TID) | ORAL | 0 refills | Status: AC
  Filled 2024-08-25: qty 90, 30d supply, fill #0

## 2024-08-25 MED ORDER — KATE FARMS STANDARD 1.4 EN LIQD
325.0000 mL | Freq: Three times a day (TID) | ENTERAL | 0 refills | Status: AC
  Filled 2024-08-25: qty 21330, 22d supply, fill #0

## 2024-08-25 MED ORDER — FUROSEMIDE 20 MG PO TABS
20.0000 mg | ORAL_TABLET | Freq: Two times a day (BID) | ORAL | 0 refills | Status: DC
  Filled 2024-08-25: qty 60, 30d supply, fill #0

## 2024-08-25 NOTE — Progress Notes (Signed)
 [facility] CLINIC CARDIOLOGY PROGRESS NOTE       Patient ID: Michele Brewer MRN: [id number] DOB/AGE: [birth year] 73 y.o.  Admit date: 08/18/2024 Referring Physician Dr. Marien Piety Primary Physician [name], MD  Primary Cardiologist None Reason for Consultation Swelling, abnormal echo  HPI: Michele Brewer is a 73 y.o. female  with a past medical history of Parkinson's disease, anemia who presented to the ED on 08/18/2024 for fall, possible syncope. Reports significant LE edema for 2-3 weeks and echo revealed valvular dysfunction. Cardiology was consulted for further evaluation.   Interval history: -Patient seen and examined this AM, OOB going to bathroom. -LE edema improved after wraps placed, still with some edema in her feet. -BP remains stable this AM, borderline but she is without symptoms. On midodrine .  Review of systems complete and found to be negative unless listed above    Past Medical History:  Diagnosis Date   Depression    Elevated CK 06/03/2023   Parkinson disease [facility])     Past Surgical History:  Procedure Laterality Date   CESAREAN SECTION     HIP ARTHROPLASTY Right 06/17/2024   Procedure: HEMIARTHROPLASTY (BIPOLAR) HIP, POSTERIOR APPROACH FOR FRACTURE;  Surgeon: Edie Norleen PARAS, MD;  Location: [facility] ORS;  Service: Orthopedics;  Laterality: Right;    Medications Prior to Admission  Medication Sig Dispense Refill Last Dose/Taking   acetaminophen  (TYLENOL ) 325 MG tablet Take 2 tablets (650 mg total) by mouth every 6 (six) hours as needed for mild pain, moderate pain, fever or headache (or Fever >/= 101).   Taking As Needed   docusate sodium  (COLACE) 100 MG capsule Take 1 capsule (100 mg total) by mouth 2 (two) times daily. 10 capsule 0 Taking   fluticasone  (FLONASE ) 50 MCG/ACT nasal spray Place 2 sprays into both nostrils daily.   Unknown   folic acid  (FOLVITE ) 1 MG tablet Take 1 tablet (1 mg total) by mouth daily. 30 tablet 0 Unknown   meclizine  (ANTIVERT )  25 MG tablet Take 1 tablet (25 mg total) by mouth 3 (three) times daily as needed for dizziness. 30 tablet 0 Taking As Needed   Multiple Vitamin (MULTIVITAMIN WITH MINERALS) TABS tablet Take 1 tablet by mouth daily. 30 tablet 0 Unknown   Vitamin D , Ergocalciferol , (DRISDOL ) 1.25 MG (50000 UNIT) CAPS capsule Take 1 capsule (50,000 Units total) by mouth once a week. 4 capsule 2 Past Month   buPROPion  (WELLBUTRIN  XL) 150 MG 24 hr tablet Take 1 tablet (150 mg total) by mouth daily. (Patient not taking: Reported on 06/17/2024) 90 tablet 1 Not Taking   carbidopa -levodopa  (SINEMET  IR) 25-100 MG tablet Take 1.5 tablets by mouth 2 (two) times daily. (Patient not taking: Reported on 06/17/2024)   Not Taking   carbidopa -levodopa  (SINEMET  IR) 25-100 MG tablet Take 1.5 tablets by mouth at bedtime. (Patient not taking: Reported on 06/17/2024)   Not Taking   cyanocobalamin  1000 MCG tablet Take 1 tablet (1,000 mcg total) by mouth daily. (Patient not taking: Reported on 08/19/2024) 30 tablet 0 Not Taking   DULoxetine  (CYMBALTA ) 30 MG capsule Take 1 capsule (30 mg total) by mouth 2 (two) times daily. (Patient not taking: Reported on 06/17/2024) 180 capsule 3 Not Taking   enoxaparin  (LOVENOX ) 40 MG/0.4ML injection Inject 0.4 mLs (40 mg total) into the skin daily for 14 days. 5.6 mL 0    gabapentin  (NEURONTIN ) 100 MG capsule Take 1 capsule (100 mg total) by mouth 3 (three) times daily. (Patient not taking: Reported on 06/17/2024) 270 capsule 1  Not Taking   HYDROcodone -acetaminophen  (NORCO/VICODIN) 5-325 MG tablet Take 1-2 tablets by mouth every 6 (six) hours as needed for moderate pain (pain score 4-6) or severe pain (pain score 7-10). (Patient not taking: Reported on 08/19/2024) 15 tablet 0 Not Taking   Nutritional Supplements (FEEDING SUPPLEMENT, NEPRO CARB STEADY,) LIQD Take 237 mLs by mouth 3 (three) times daily between meals. 21330 mL 0    polyethylene glycol (MIRALAX  / GLYCOLAX ) 17 g packet Take 17 g by mouth daily as needed  for moderate constipation. 30 each 0    thiamine  (VITAMIN B-1) 100 MG tablet Take 1 tablet (100 mg total) by mouth daily. (Patient not taking: Reported on 08/19/2024) 30 tablet 0 Not Taking   Social History   Socioeconomic History   Marital status: Widowed    Spouse name: Not on file   Number of children: Not on file   Years of education: Not on file   Highest education level: Not on file  Occupational History   Not on file  Tobacco Use   Smoking status: Never   Smokeless tobacco: Never  Substance and Sexual Activity   Alcohol  use: Not Currently   Drug use: Never   Sexual activity: Not on file  Other Topics Concern   Not on file  Social History Narrative   Not on file   Social Drivers of Health   Financial Resource Strain: Low Risk  (06/12/2024)   Received from [name]   Overall Financial Resource Strain (CARDIA)    Difficulty of Paying Living Expenses: Not hard at all  Food Insecurity: No Food Insecurity (08/18/2024)   Hunger Vital Sign    Worried About Running Out of Food in the Last Year: Never true    Ran Out of Food in the Last Year: Never true  Transportation Needs: No Transportation Needs (08/18/2024)   PRAPARE - Administrator, Civil Service (Medical): No    Lack of Transportation (Non-Medical): No  Physical Activity: Not on file  Stress: Not on file  Social Connections: Moderately Isolated (08/18/2024)   Social Connection and Isolation Panel    Frequency of Communication with Friends and Family: Three times a week    Frequency of Social Gatherings with Friends and Family: Three times a week    Attends Religious Services: 1 to 4 times per year    Active Member of Clubs or Organizations: No    Attends Banker Meetings: Never    Marital Status: Never married  Intimate Partner Violence: Not At Risk (08/18/2024)   Humiliation, Afraid, Rape, and Kick questionnaire    Fear of Current or Ex-Partner: No    Emotionally Abused:  No    Physically Abused: No    Sexually Abused: No    Family History  Problem Relation Age of Onset   Gout Mother    Heart disease Father      Vitals:   08/24/24 0832 08/24/24 1557 08/24/24 2038 08/25/24 0510  BP: (!) 95/51 (!) 102/55 (!) 95/52 102/65  Pulse: 80 81 76 71  Resp: 14 14 16 16   Temp: 97.9 F (36.6 C) 98 F (36.7 C) 98.6 F (37 C) 98 F (36.7 C)  TempSrc: Oral Oral Oral   SpO2: 99% 100% 100% 99%  Weight:      Height:        PHYSICAL EXAM General: Chronically ill-appearing elderly female, well nourished, in no acute distress. HEENT: Normocephalic and atraumatic. Neck: No JVD.  Lungs: Normal  respiratory effort on room air. Clear bilaterally to auscultation. No wheezes, crackles, rhonchi.  Heart: HRRR. Normal S1 and S2 without gallops.  + Murmur. Abdomen: Non-distended appearing.  Msk: Normal strength and tone for age. Extremities: Warm and well perfused. No clubbing, cyanosis.  +2 edema.  Neuro: Alert and oriented X 3. Psych: Answers questions appropriately.   Labs: Basic Metabolic Panel: Recent Labs    08/23/24 0653 08/25/24 0647  NA 140 139  K 3.9 3.9  CL 103 102  CO2 30 30  GLUCOSE 85 88  BUN 16 20  CREATININE 0.73 0.70  CALCIUM 9.1 9.4   Liver Function Tests: No results for input(s): AST, ALT, ALKPHOS, BILITOT, PROT, ALBUMIN in the last 72 hours.  No results for input(s): LIPASE, AMYLASE in the last 72 hours. CBC: Recent Labs    08/23/24 0653 08/25/24 0647  HGB 10.0* 10.7*    Cardiac Enzymes: Recent Labs    08/23/24 0653  CKTOTAL 204    BNP: No results for input(s): BNP in the last 72 hours. D-Dimer: No results for input(s): DDIMER in the last 72 hours. Hemoglobin A1C: No results for input(s): HGBA1C in the last 72 hours. Fasting Lipid Panel: No results for input(s): CHOL, HDL, LDLCALC, TRIG, CHOLHDL, LDLDIRECT in the last 72 hours. Thyroid  Function Tests: No results for input(s): TSH,  T4TOTAL, T3FREE, THYROIDAB in the last 72 hours.  Invalid input(s): FREET3  Anemia Panel: No results for input(s): VITAMINB12, FOLATE, FERRITIN, TIBC, IRON , RETICCTPCT in the last 72 hours.    Radiology: MR BRAIN WO CONTRAST Result Date: 08/22/2024 EXAM: MRI BRAIN WITHOUT CONTRAST 08/22/2024 12:17:51 PM TECHNIQUE: Multiplanar multisequence MRI of the head/brain was performed without the administration of intravenous contrast. COMPARISON: MR Head without IV contrast 07/15/2022 and CT head 08/18/2024. CLINICAL HISTORY: 73 year old female presenting with leaning over to one side. FINDINGS: BRAIN AND VENTRICLES: Brain volume has not significantly changed from the previous MRI. No acute infarct. No intracranial hemorrhage. No mass. No midline shift. No hydrocephalus. The sella is unremarkable. Normal flow voids. Chronic intracranial artery tortuosity. Elnor and white matter signal has not significantly changed since 2023. Patchy chronic lacunar infarct at the anterior right basal ganglia. Mild contralateral left basal ganglia chronic T2 heterogeneity. Subtle chronic lacunar infarct of the right thalamus. No cortical encephalomalacia. No chronic cerebral blood products. Otherwise normal for age gray and white matter signal. ORBITS: No acute abnormality. SINUSES AND MASTOIDS: Visible internal auditory structures appear grossly normal. Paranasal sinuses, tympanic cavities, and mastoids are well aerated. BONES AND SOFT TISSUES: Chronic cervical spine degeneration. Normal marrow signal. No acute soft tissue abnormality. IMPRESSION: 1. No acute intracranial abnormality. 2. 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 Electronically signed by: Helayne Hurst MD 08/22/2024 12:56 PM EST RP Workstation: [id number]   ECHOCARDIOGRAM COMPLETE Result Date: 08/19/2024    ECHOCARDIOGRAM REPORT   Patient Name:   Michele Brewer Date of Exam: 08/19/2024 Medical Rec #:   [id number]     Height:       64.0 in Accession #:    [id number]    Weight:       140.0 lb Date of Birth:  [birth year]     BSA:          1.681 m Patient Age:    73 years      BP:           111/54 mmHg Patient Gender: F  HR:           91 bpm. Exam Location:  [facility] Procedure: 2D Echo, Cardiac Doppler and Color Doppler (Both Spectral and Color            Flow Doppler were utilized during procedure). Indications:     Syncope R55  History:         Patient has no prior history of Echocardiogram examinations.                  Signs/Symptoms:Syncope.  Sonographer:     Ashley McNeely-Sloane Referring Phys:  [id number] DAVED BROCKS [facility] Diagnosing Phys: Lonni Hanson MD IMPRESSIONS  1. Left ventricular ejection fraction, by estimation, is 60 to 65%. The left ventricle has normal function. The left ventricle has no regional wall motion abnormalities. Left ventricular diastolic parameters were normal.  2. Right ventricular systolic function is normal. The right ventricular size is normal. There is mildly elevated pulmonary artery systolic pressure.  3. Right atrial size was mildly dilated.  4. The mitral valve is degenerative. Moderate mitral valve regurgitation. No evidence of mitral stenosis.  5. Tricuspid valve regurgitation is moderate.  6. The aortic valve is tricuspid. There is mild thickening of the aortic valve. Aortic valve regurgitation is mild to moderate. No aortic stenosis is present.  7. The inferior vena cava is dilated in size with <50% respiratory variability, suggesting right atrial pressure of 15 mmHg. FINDINGS  Left Ventricle: Left ventricular ejection fraction, by estimation, is 60 to 65%. The left ventricle has normal function. The left ventricle has no regional wall motion abnormalities. Global longitudinal strain performed but not reported based on interpreter judgement due to suboptimal tracking. The left ventricular internal cavity size was normal in size. There is no left ventricular hypertrophy.  Left ventricular diastolic parameters were normal. Right Ventricle: The right ventricular size is normal. No increase in right ventricular wall thickness. Right ventricular systolic function is normal. There is mildly elevated pulmonary artery systolic pressure. The tricuspid regurgitant velocity is 2.37  m/s, and with an assumed right atrial pressure of 15 mmHg, the estimated right ventricular systolic pressure is 37.5 mmHg. Left Atrium: Left atrial size was normal in size. Right Atrium: Right atrial size was mildly dilated. Pericardium: There is no evidence of pericardial effusion. Mitral Valve: The mitral valve is degenerative in appearance. There is mild thickening of the mitral valve leaflet(s). There is mild calcification of the mitral valve leaflet(s). Moderate mitral valve regurgitation. No evidence of mitral valve stenosis. MV peak gradient, 6.8 mmHg. The mean mitral valve gradient is 3.0 mmHg. Tricuspid Valve: The tricuspid valve is normal in structure. Tricuspid valve regurgitation is moderate. Aortic Valve: The aortic valve is tricuspid. There is mild thickening of the aortic valve. Aortic valve regurgitation is mild to moderate. Aortic regurgitation PHT measures 493 msec. No aortic stenosis is present. Aortic valve mean gradient measures 4.0 mmHg. Aortic valve peak gradient measures 7.5 mmHg. Aortic valve area, by VTI measures 2.98 cm. Pulmonic Valve: The pulmonic valve was normal in structure. Pulmonic valve regurgitation is mild. Aorta: The aortic root and ascending aorta are structurally normal, with no evidence of dilitation. Pulmonary Artery: The pulmonary artery is of normal size. Venous: The inferior vena cava is dilated in size with less than 50% respiratory variability, suggesting right atrial pressure of 15 mmHg. IAS/Shunts: The interatrial septum was not well visualized. Additional Comments: 3D was performed not requiring image post processing on an independent workstation and was  indeterminate.  LEFT VENTRICLE  PLAX 2D LVIDd:         4.20 cm     Diastology LVIDs:         2.40 cm     LV e' medial:    9.32 cm/s LV PW:         0.80 cm     LV E/e' medial:  9.6 LV IVS:        0.90 cm     LV e' lateral:   11.30 cm/s LVOT diam:     2.10 cm     LV E/e' lateral: 7.9 LV SV:         83 LV SV Index:   49 LVOT Area:     3.46 cm LV IVRT:       77 msec  LV Volumes (MOD) LV vol d, MOD A2C: 81.2 ml LV vol d, MOD A4C: 90.6 ml LV vol s, MOD A2C: 15.3 ml LV vol s, MOD A4C: 25.6 ml LV SV MOD A2C:     65.9 ml LV SV MOD A4C:     90.6 ml LV SV MOD BP:      67.9 ml RIGHT VENTRICLE             IVC RV Basal diam:  3.30 cm     IVC diam: 2.40 cm RV Mid diam:    3.10 cm RV S prime:     19.70 cm/s  PULMONARY VEINS TAPSE (M-mode): 3.1 cm      A Reversal Duration: 173.00 msec                             A Reversal Velocity: 36.70 cm/s                             Diastolic Velocity:  38.20 cm/s                             S/D Velocity:        1.90                             Systolic Velocity:   72.40 cm/s LEFT ATRIUM             Index        RIGHT ATRIUM           Index LA diam:        3.90 cm 2.32 cm/m   RA Area:     19.50 cm LA Vol (A2C):   52.0 ml 30.93 ml/m  RA Volume:   59.40 ml  35.33 ml/m LA Vol (A4C):   34.5 ml 20.52 ml/m LA Biplane Vol: 43.4 ml 25.81 ml/m  AORTIC VALVE                    PULMONIC VALVE AV Area (Vmax):    3.39 cm     PV Vmax:          0.98 m/s AV Area (Vmean):   3.09 cm     PV Vmean:         70.600 cm/s AV Area (VTI):     2.98 cm     PV VTI:           0.198 m AV Vmax:  137.00 cm/s  PV Peak grad:     3.8 mmHg AV Vmean:          94.500 cm/s  PV Mean grad:     2.0 mmHg AV VTI:            0.278 m      PR End Diast Vel: 3.58 msec AV Peak Grad:      7.5 mmHg     RVOT Peak grad:   2 mmHg AV Mean Grad:      4.0 mmHg LVOT Vmax:         134.00 cm/s LVOT Vmean:        84.300 cm/s LVOT VTI:          0.239 m LVOT/AV VTI ratio: 0.86 AI PHT:            493 msec  AORTA Ao Root diam: 2.80 cm Ao Asc  diam:  2.60 cm MITRAL VALVE                  TRICUSPID VALVE MV Area (PHT): 3.72 cm       TV Peak grad:   30.0 mmHg MV Area VTI:   2.84 cm       TV Vmax:        2.74 m/s MV Peak grad:  6.8 mmHg       TR Peak grad:   22.5 mmHg MV Mean grad:  3.0 mmHg       TR Mean grad:   17.0 mmHg MV Vmax:       1.30 m/s       TR Vmax:        237.00 cm/s MV Vmean:      78.4 cm/s      TR Vmean:       199.0 cm/s MV Decel Time: 204 msec MR Peak grad:    95.8 mmHg    SHUNTS MR Mean grad:    76.0 mmHg    Systemic VTI:  0.24 m MR Vmax:         489.50 cm/s  Systemic Diam: 2.10 cm MR Vmean:        424.0 cm/s   Pulmonic VTI:  0.180 m MR PISA:         1.90 cm MR PISA Eff ROA: 15 mm MR PISA Radius:  0.55 cm MV E velocity: 89.10 cm/s MV A velocity: 118.00 cm/s MV E/A ratio:  0.76 Lonni End MD Electronically signed by Lonni Hanson MD Signature Date/Time: 08/19/2024/12:16:14 PM    Final    DG Tibia/Fibula Left Result Date: 08/18/2024 CLINICAL DATA:  Fall.  Left-sided pain. EXAM: LEFT TIBIA AND FIBULA - 2 VIEW COMPARISON:  None Available. FINDINGS: No acute fracture involving the left tibia or fibula. Calcaneal spurring. Bilateral soft tissue swelling at the left ankle. Left ankle is located. Left knee is located. Degenerative changes in left knee. IMPRESSION: 1. No acute bone abnormality to the left tibia or fibula. 2. Soft tissue swelling at the left ankle. Electronically Signed   By: Juliene Balder M.D.   On: 08/18/2024 13:41   DG HIP UNILAT WITH PELVIS 2-3 VIEWS LEFT Result Date: 08/18/2024 CLINICAL DATA:  Left-sided pain.  Status post fall. EXAM: DG HIP (WITH OR WITHOUT PELVIS) 2-3V LEFT COMPARISON:  06/17/2024 FINDINGS: Status post right hip arthroplasty. The right femoral stem is not completely imaged. Pelvic bony ring is intact. Left hip is located without acute fracture. IMPRESSION: 1. No acute abnormality to the left  hip. 2. Status post right hip arthroplasty. Electronically Signed   By: Juliene Balder M.D.   On: 08/18/2024  13:39   DG Humerus Left Result Date: 08/18/2024 CLINICAL DATA:  Fall.  Left-sided pain. EXAM: LEFT HUMERUS - 2+ VIEW COMPARISON:  None Available. FINDINGS: Left humerus is intact without a fracture. Mild elevation of the distal left clavicle. Degenerative changes at the left [facility] joint. Evidence for old left rib fractures. Limited evaluation of the left elbow. IMPRESSION: 1. No acute bone abnormality to the left humerus. 2. Mild elevation of the distal left clavicle. Findings could be associated with an age-indeterminate AC joint injury. Recommend clinical correlation in this area. Electronically Signed   By: Juliene Balder M.D.   On: 08/18/2024 13:38   CT Head Wo Contrast Result Date: 08/18/2024 EXAM: CT HEAD WITHOUT 08/18/2024 12:00:32 PM TECHNIQUE: CT of the head was performed without the administration of intravenous contrast. Automated exposure control, iterative reconstruction, and/or weight based adjustment of the mA/kV was utilized to reduce the radiation dose to as low as reasonably achievable. COMPARISON: 06/02/2023 CLINICAL HISTORY: Head trauma, minor (Age >= 65y) FINDINGS: BRAIN AND VENTRICLES: No acute intracranial hemorrhage. No mass effect or midline shift. No extra-axial fluid collection. No evidence of acute infarct. No hydrocephalus. Cerebral ventricle sizes concordant with degree of cerebral volume loss. ORBITS: No acute abnormality. SINUSES AND MASTOIDS: No acute abnormality. SOFT TISSUES AND SKULL: No acute skull fracture. No acute soft tissue abnormality. IMPRESSION: 1. No acute intracranial abnormality related to head trauma. 2. Cerebral ventricle sizes concordant with degree of cerebral volume loss. Electronically signed by: Evalene Coho MD 08/18/2024 12:13 PM EST RP Workstation: [id number]   CT Cervical Spine Wo Contrast Result Date: 08/18/2024 CLINICAL DATA:  Neck trauma (Age >= 65y) EXAM: CT CERVICAL SPINE WITHOUT CONTRAST TECHNIQUE: Multidetector CT imaging of the cervical spine  was performed without intravenous contrast. Multiplanar CT image reconstructions were also generated. RADIATION DOSE REDUCTION: This exam was performed according to the departmental dose-optimization program which includes automated exposure control, adjustment of the mA and/or kV according to patient size and/or use of iterative reconstruction technique. COMPARISON:  MRI cervical spine 07/15/2022. CT cervical spine 02/03/2021. FINDINGS: Alignment: Chronic reversal of the usual cervical lordosis, similar to previous CT. No focal angulation or significant listhesis. Skull base and vertebrae: No evidence of acute cervical spine fracture or traumatic subluxation. Soft tissues and spinal canal: No prevertebral fluid or swelling. No visible canal hematoma. Disc levels: Chronic spondylosis with disc space narrowing, uncinate spurring and facet hypertrophy. Chronic interfacetal ankylosis on the right at C2-3. Chronic foraminal narrowing, greatest on the right at C4-5 and on the left at C6-7. No large disc herniation identified. Upper chest: Clear lung apices. Other: Asymmetric TMJ degenerative changes on the left. IMPRESSION: 1. No evidence of acute cervical spine fracture, traumatic subluxation or static signs of instability. 2. Chronic cervical spondylosis as described. Electronically Signed   By: Elsie Perone M.D.   On: 08/18/2024 12:12   US  Venous Img Lower Bilateral Result Date: 08/02/2024 CLINICAL DATA:  Leg swelling. EXAM: BILATERAL LOWER EXTREMITY VENOUS DOPPLER ULTRASOUND TECHNIQUE: Gray-scale sonography with compression, as well as color and duplex ultrasound, were performed to evaluate the deep venous system(s) from the level of the common femoral vein through the popliteal and proximal calf veins. COMPARISON:  07/14/2022. FINDINGS: VENOUS Normal compressibility of the common femoral, superficial femoral, and popliteal veins, as well as the visualized calf veins. Visualized portions of profunda femoral  vein and great  saphenous vein unremarkable. No filling defects to suggest DVT on grayscale or color Doppler imaging. Doppler waveforms show normal direction of venous flow, normal respiratory plasticity and response to augmentation. Limited views of the contralateral common femoral vein are unremarkable. OTHER Small amount of fluid is noted in the popliteal fossa on the left. Limitations: none IMPRESSION: 1. No evidence of deep venous thrombosis bilaterally. 2. Small amount of fluid in the popliteal fossa on the left. Electronically Signed   By: Leita Birmingham M.D.   On: 08/02/2024 14:40   DG Hip Unilat W or Wo Pelvis 2-3 Views Right Result Date: 08/02/2024 CLINICAL DATA:  Fall.  Pain. EXAM: DG HIP (WITH OR WITHOUT PELVIS) 2-3V RIGHT COMPARISON:  06/17/2024 FINDINGS: Status post right hip replacement. No evidence for periprosthetic fracture. No dislocation. Stable appearance of the right superior inferior pubic rami without definite evidence for acute fracture. SI joints and symphysis pubis unremarkable. Degenerative changes noted lower lumbar spine. IMPRESSION: Status post right hip replacement without evidence for periprosthetic fracture or dislocation. Electronically Signed   By: Camellia Candle M.D.   On: 08/02/2024 08:34    ECHO as above  TELEMETRY (personally reviewed): not on tele  EKG (personally reviewed): SR rate 107 bpm but with significant artifact  Data reviewed by me 08/25/2024: last 24h vitals tele labs imaging I/O ED provider note, admission H&P, hospitalist progress note  Principal Problem:   Syncope Active Problems:   Fall   Rhabdomyolysis   Parkinson disease (HCC)   Ambulatory dysfunction   Malnutrition of moderate degree   UTI (urinary tract infection)   Chronic anemia   Lower extremity edema   Weakness   Hypotension    ASSESSMENT AND PLAN:  Michele Brewer is a 73 y.o. female  with a past medical history of Parkinson's disease, anemia who presented to the ED on 08/18/2024  for fall, possible syncope. Reports significant LE edema for 2-3 weeks and echo revealed valvular dysfunction. Cardiology was consulted for further evaluation.   # Generalized edema # Valvular heart disease # Parkinson's disease Patient initially presented for evaluation after fall, also reporting 2 to 3 weeks of generalized body swelling.  Echo was done and this revealed EF 60-65%, no wall motion abnormalities, normal diastolic function, moderate MR, moderate TR, mild to moderate AR.  Received 1 dose of IV Lasix  20 mg today with good UOP reported by patient. - LE wraps in place. Continue lasix  PO 20 mg BID. - Patient does not have heart failure based on echo findings, valvular regurgitation can contribute to edema but feel that this is likely multifactorial. - BP borderline, continue to monitor.  Cardiology will sign off. Please haiku with questions or re-engage if needed. Follow up with Dr. Custovic in 1-2 weeks.   This patient's plan of care was discussed and created with Dr. Ammon and he is in agreement.  Signed: Danita Bloch, PA-C  08/25/2024, 7:48 AM [facility]

## 2024-08-25 NOTE — Assessment & Plan Note (Signed)
On midodrine 

## 2024-08-25 NOTE — Discharge Summary (Signed)
 Physician Discharge Summary   Patient: Michele Brewer MRN: [id number] DOB: [birth year]  Admit date:     08/18/2024  Discharge date: 08/25/24  Discharge Physician: Charlie Patterson   PCP: [name], MD   Recommendations at discharge:   Follow-up PCP 5 days Follow-up cardiology 1 week  Discharge Diagnoses: Principal Problem:   Syncope Active Problems:   Hypotension   Fall   Ambulatory dysfunction   Lower extremity edema   Rhabdomyolysis   Malnutrition of moderate degree   Parkinson disease (HCC)   Chronic anemia   UTI (urinary tract infection)   Weakness    Hospital Course: Partly taken from H&P.  Michele Brewer is a 73 y.o. female with medical history significant of Parkinson's disease, Presents to the ED for evaluation after a fall at home. She is unable to recall what happened.  In the emergency department, she was hemodynamically stable.  No leukocytosis.  CK elevated at 728, CT head, C-spine, and x-rays of the left humerus, left tib-fib, left hip all without acute abnormality.  Her UA was indicative of a UTI.  She was unable to ambulate in the emergency department.   She was started on ceftriaxone , urine cultures were obtained.  Was admitted for concern of fall versus syncope with ambulatory dysfunction.  12/2: Vital stable, slight worsening of CK to 1713, continuing IV fluid, B12 of 324, urine cultures pending. 12/5.  Peer to peer done and does not look promising for rehab.  Patient states she is falling over to the left.  MRI of the brain negative for acute event.  Unna boot wraps. 12/6.  Unsafe discharge home since patient was unable to get out of the bed on her own.  Continue working with PT and OT to try to get her stronger while here. 12/8.  Patient did better with physical therapy today but still needed assistance.  DME walker and 3 and 1 commode ordered.  Since insurance company did not want to approve rehab patient will have to go home with home  health.  Assessment and Plan: * Syncope Syncope versus fall. History of ambulatory dysfunction. Increase midodrine  to 5 mg 3 times daily.  Continue working with physical therapy.  Physical therapy recommending rehab but insurance company did not want to approve.  Patient did better today with physical therapy.  Will discharge home with home health.   Hypotension On midodrine .  Lower extremity edema Bilaterally.  Change Lasix  to p.o.  Unna boot placement by wound care nurse, will need to change weekly  Rhabdomyolysis CK at 1713 on presentation.  Likely secondary to fall.  Last CK 204. Received IV fluids during hospital course  Malnutrition of moderate degree Continue supplements  Parkinson disease (HCC) On Sinemet   Chronic anemia Last hemoglobin 10.7, continue B12 supplementation  UTI (urinary tract infection) Ruled out.  Urine culture negative.  Weakness Feels like she is leaning over when moving around.  MRI of the brain negative.           Consultants: Cardiology Procedures performed: None Disposition: Home Diet recommendation:  Regular diet DISCHARGE MEDICATION: Allergies as of 08/25/2024       Reactions   Citrus    rash   Peanut-containing Drug Products    Rash and itch        Medication List     STOP taking these medications    buPROPion  150 MG 24 hr tablet Commonly known as: WELLBUTRIN  XL   DULoxetine  30 MG capsule Commonly known as: CYMBALTA   enoxaparin  40 MG/0.4ML injection Commonly known as: LOVENOX    feeding supplement (NEPRO CARB STEADY) Liqd Replaced by: feeding supplement (KATE FARMS STANDARD ENT 1.4) Liqd liquid   gabapentin  100 MG capsule Commonly known as: NEURONTIN    HYDROcodone -acetaminophen  5-325 MG tablet Commonly known as: NORCO/VICODIN   meclizine  25 MG tablet Commonly known as: ANTIVERT    thiamine  100 MG tablet Commonly known as: Vitamin B-1       TAKE these medications    acetaminophen  325 MG  tablet Commonly known as: TYLENOL  Take 2 tablets (650 mg total) by mouth every 6 (six) hours as needed for mild pain, moderate pain, fever or headache (or Fever >/= 101).   carbidopa -levodopa  25-100 MG tablet Commonly known as: SINEMET  IR Take 1.5 tablets by mouth 2 (two) times daily.   carbidopa -levodopa  25-100 MG tablet Commonly known as: SINEMET  IR Take 1.5 tablets by mouth at bedtime.   cyanocobalamin  1000 MCG tablet Commonly known as: VITAMIN B12 Take 1 tablet (1,000 mcg total) by mouth daily.   docusate sodium  100 MG capsule Commonly known as: COLACE Take 1 capsule (100 mg total) by mouth 2 (two) times daily.   feeding supplement (KATE FARMS STANDARD ENT 1.4) Liqd liquid Take 325 mLs by mouth 3 (three) times daily between meals. Replaces: feeding supplement (NEPRO CARB STEADY) Liqd   fluticasone  50 MCG/ACT nasal spray Commonly known as: FLONASE  Place 2 sprays into both nostrils daily.   folic acid  1 MG tablet Commonly known as: FOLVITE  Take 1 tablet (1 mg total) by mouth daily.   furosemide  20 MG tablet Commonly known as: LASIX  Take 1 tablet (20 mg total) by mouth 2 (two) times daily.   midodrine  5 MG tablet Commonly known as: PROAMATINE  Take 1 tablet (5 mg total) by mouth 3 (three) times daily with meals.   multivitamin with minerals Tabs tablet Take 1 tablet by mouth daily.   polyethylene glycol 17 g packet Commonly known as: MIRALAX  / GLYCOLAX  Take 17 g by mouth daily as needed for moderate constipation.   pyridOXINE  100 MG tablet Commonly known as: VITAMIN B6 Take 1 tablet (100 mg total) by mouth daily.   Vitamin D  (Ergocalciferol ) 1.25 MG (50000 UNIT) Caps capsule Commonly known as: DRISDOL  Take 1 capsule (50,000 Units total) by mouth once a week.               Durable Medical Equipment  (From admission, onward)           Start     Ordered   08/25/24 0812  For home use only DME Walker rolling  Once       Question Answer Comment   Walker: With 5 Inch Wheels   Patient needs a walker to treat with the following condition Generalized weakness      08/25/24 0811   08/25/24 0812  For home use only DME Bedside commode  Once       Question:  Patient needs a bedside commode to treat with the following condition  Answer:  Generalized weakness   08/25/24 0811            Contact information for follow-up providers     [name], MD Follow up.   Specialty: Internal Medicine Why: No answer at this time  Patient to call and make follow up  hospital follow up Contact information: [address] [facility] [place] [address] [phone]         Dewane Shiner, DO. Go in 1 week(s).  Specialty: Cardiology Why: No answer at this time Patient to call and make follow up Contact information: [address] [place] [address] [phone]              Contact information for after-discharge care     Destination     [facility] Preferred SNF .   Service: Skilled Nursing Contact information: [address] [place]  [address] [phone]                    Discharge Exam: Fredricka Weights   08/18/24 1110  Weight: 63.5 kg   Physical Exam HENT:     Head: Normocephalic.  Eyes:     General: Lids are normal.     Conjunctiva/sclera: Conjunctivae normal.  Cardiovascular:     Rate and Rhythm: Normal rate and regular rhythm.     Heart sounds: Normal heart sounds, S1 normal and S2 normal.  Pulmonary:     Breath sounds: No decreased breath sounds, wheezing, rhonchi or rales.  Abdominal:     Palpations: Abdomen is soft.     Tenderness: There is no abdominal tenderness.  Musculoskeletal:     Right lower leg: Swelling present.     Left lower leg: Swelling present.  Skin:    General: Skin is warm.     Comments: Legs covered with Unna boots  Neurological:     Mental Status: She is alert.      Condition at  discharge: stable  The results of significant diagnostics from this hospitalization (including imaging, microbiology, ancillary and laboratory) are listed below for reference.   Imaging Studies: MR BRAIN WO CONTRAST Result Date: 08/22/2024 EXAM: MRI BRAIN WITHOUT CONTRAST 08/22/2024 12:17:51 PM TECHNIQUE: Multiplanar multisequence MRI of the head/brain was performed without the administration of intravenous contrast. COMPARISON: MR Head without IV contrast 07/15/2022 and CT head 08/18/2024. CLINICAL HISTORY: 73 year old female presenting with leaning over to one side. FINDINGS: BRAIN AND VENTRICLES: Brain volume has not significantly changed from the previous MRI. No acute infarct. No intracranial hemorrhage. No mass. No midline shift. No hydrocephalus. The sella is unremarkable. Normal flow voids. Chronic intracranial artery tortuosity. Elnor and white matter signal has not significantly changed since 2023. Patchy chronic lacunar infarct at the anterior right basal ganglia. Mild contralateral left basal ganglia chronic T2 heterogeneity. Subtle chronic lacunar infarct of the right thalamus. No cortical encephalomalacia. No chronic cerebral blood products. Otherwise normal for age gray and white matter signal. ORBITS: No acute abnormality. SINUSES AND MASTOIDS: Visible internal auditory structures appear grossly normal. Paranasal sinuses, tympanic cavities, and mastoids are well aerated. BONES AND SOFT TISSUES: Chronic cervical spine degeneration. Normal marrow signal. No acute soft tissue abnormality. IMPRESSION: 1. No acute intracranial abnormality. 2. 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 Electronically signed by: Helayne Hurst MD 08/22/2024 12:56 PM EST RP Workstation: [id number]   ECHOCARDIOGRAM COMPLETE Result Date: 08/19/2024    ECHOCARDIOGRAM REPORT   Patient Name:   LEORA PLATT Date of Exam: 08/19/2024 Medical Rec #:  [id number]     Height:       64.0  in Accession #:    [id number]    Weight:       140.0 lb Date of Birth:  May 27, 1951     BSA:          1.681 m Patient Age:    73 years      BP:  111/54 mmHg Patient Gender: F             HR:           91 bpm. Exam Location:  [facility] Procedure: 2D Echo, Cardiac Doppler and Color Doppler (Both Spectral and Color            Flow Doppler were utilized during procedure). Indications:     Syncope R55  History:         Patient has no prior history of Echocardiogram examinations.                  Signs/Symptoms:Syncope.  Sonographer:     Ashley McNeely-Sloane Referring Phys:  [id number] DAVED BROCKS [facility] Diagnosing Phys: Lonni Hanson MD IMPRESSIONS  1. Left ventricular ejection fraction, by estimation, is 60 to 65%. The left ventricle has normal function. The left ventricle has no regional wall motion abnormalities. Left ventricular diastolic parameters were normal.  2. Right ventricular systolic function is normal. The right ventricular size is normal. There is mildly elevated pulmonary artery systolic pressure.  3. Right atrial size was mildly dilated.  4. The mitral valve is degenerative. Moderate mitral valve regurgitation. No evidence of mitral stenosis.  5. Tricuspid valve regurgitation is moderate.  6. The aortic valve is tricuspid. There is mild thickening of the aortic valve. Aortic valve regurgitation is mild to moderate. No aortic stenosis is present.  7. The inferior vena cava is dilated in size with <50% respiratory variability, suggesting right atrial pressure of 15 mmHg. FINDINGS  Left Ventricle: Left ventricular ejection fraction, by estimation, is 60 to 65%. The left ventricle has normal function. The left ventricle has no regional wall motion abnormalities. Global longitudinal strain performed but not reported based on interpreter judgement due to suboptimal tracking. The left ventricular internal cavity size was normal in size. There is no left ventricular hypertrophy. Left ventricular diastolic  parameters were normal. Right Ventricle: The right ventricular size is normal. No increase in right ventricular wall thickness. Right ventricular systolic function is normal. There is mildly elevated pulmonary artery systolic pressure. The tricuspid regurgitant velocity is 2.37  m/s, and with an assumed right atrial pressure of 15 mmHg, the estimated right ventricular systolic pressure is 37.5 mmHg. Left Atrium: Left atrial size was normal in size. Right Atrium: Right atrial size was mildly dilated. Pericardium: There is no evidence of pericardial effusion. Mitral Valve: The mitral valve is degenerative in appearance. There is mild thickening of the mitral valve leaflet(s). There is mild calcification of the mitral valve leaflet(s). Moderate mitral valve regurgitation. No evidence of mitral valve stenosis. MV peak gradient, 6.8 mmHg. The mean mitral valve gradient is 3.0 mmHg. Tricuspid Valve: The tricuspid valve is normal in structure. Tricuspid valve regurgitation is moderate. Aortic Valve: The aortic valve is tricuspid. There is mild thickening of the aortic valve. Aortic valve regurgitation is mild to moderate. Aortic regurgitation PHT measures 493 msec. No aortic stenosis is present. Aortic valve mean gradient measures 4.0 mmHg. Aortic valve peak gradient measures 7.5 mmHg. Aortic valve area, by VTI measures 2.98 cm. Pulmonic Valve: The pulmonic valve was normal in structure. Pulmonic valve regurgitation is mild. Aorta: The aortic root and ascending aorta are structurally normal, with no evidence of dilitation. Pulmonary Artery: The pulmonary artery is of normal size. Venous: The inferior vena cava is dilated in size with less than 50% respiratory variability, suggesting right atrial pressure of 15 mmHg. IAS/Shunts: The interatrial septum was not well visualized. Additional Comments: 3D was  performed not requiring image post processing on an independent workstation and was indeterminate.  LEFT VENTRICLE PLAX  2D LVIDd:         4.20 cm     Diastology LVIDs:         2.40 cm     LV e' medial:    9.32 cm/s LV PW:         0.80 cm     LV E/e' medial:  9.6 LV IVS:        0.90 cm     LV e' lateral:   11.30 cm/s LVOT diam:     2.10 cm     LV E/e' lateral: 7.9 LV SV:         83 LV SV Index:   49 LVOT Area:     3.46 cm LV IVRT:       77 msec  LV Volumes (MOD) LV vol d, MOD A2C: 81.2 ml LV vol d, MOD A4C: 90.6 ml LV vol s, MOD A2C: 15.3 ml LV vol s, MOD A4C: 25.6 ml LV SV MOD A2C:     65.9 ml LV SV MOD A4C:     90.6 ml LV SV MOD BP:      67.9 ml RIGHT VENTRICLE             IVC RV Basal diam:  3.30 cm     IVC diam: 2.40 cm RV Mid diam:    3.10 cm RV S prime:     19.70 cm/s  PULMONARY VEINS TAPSE (M-mode): 3.1 cm      A Reversal Duration: 173.00 msec                             A Reversal Velocity: 36.70 cm/s                             Diastolic Velocity:  38.20 cm/s                             S/D Velocity:        1.90                             Systolic Velocity:   72.40 cm/s LEFT ATRIUM             Index        RIGHT ATRIUM           Index LA diam:        3.90 cm 2.32 cm/m   RA Area:     19.50 cm LA Vol (A2C):   52.0 ml 30.93 ml/m  RA Volume:   59.40 ml  35.33 ml/m LA Vol (A4C):   34.5 ml 20.52 ml/m LA Biplane Vol: 43.4 ml 25.81 ml/m  AORTIC VALVE                    PULMONIC VALVE AV Area (Vmax):    3.39 cm     PV Vmax:          0.98 m/s AV Area (Vmean):   3.09 cm     PV Vmean:         70.600 cm/s AV Area (VTI):     2.98 cm     PV VTI:  0.198 m AV Vmax:           137.00 cm/s  PV Peak grad:     3.8 mmHg AV Vmean:          94.500 cm/s  PV Mean grad:     2.0 mmHg AV VTI:            0.278 m      PR End Diast Vel: 3.58 msec AV Peak Grad:      7.5 mmHg     RVOT Peak grad:   2 mmHg AV Mean Grad:      4.0 mmHg LVOT Vmax:         134.00 cm/s LVOT Vmean:        84.300 cm/s LVOT VTI:          0.239 m LVOT/AV VTI ratio: 0.86 AI PHT:            493 msec  AORTA Ao Root diam: 2.80 cm Ao Asc diam:  2.60 cm MITRAL VALVE                   TRICUSPID VALVE MV Area (PHT): 3.72 cm       TV Peak grad:   30.0 mmHg MV Area VTI:   2.84 cm       TV Vmax:        2.74 m/s MV Peak grad:  6.8 mmHg       TR Peak grad:   22.5 mmHg MV Mean grad:  3.0 mmHg       TR Mean grad:   17.0 mmHg MV Vmax:       1.30 m/s       TR Vmax:        237.00 cm/s MV Vmean:      78.4 cm/s      TR Vmean:       199.0 cm/s MV Decel Time: 204 msec MR Peak grad:    95.8 mmHg    SHUNTS MR Mean grad:    76.0 mmHg    Systemic VTI:  0.24 m MR Vmax:         489.50 cm/s  Systemic Diam: 2.10 cm MR Vmean:        424.0 cm/s   Pulmonic VTI:  0.180 m MR PISA:         1.90 cm MR PISA Eff ROA: 15 mm MR PISA Radius:  0.55 cm MV E velocity: 89.10 cm/s MV A velocity: 118.00 cm/s MV E/A ratio:  0.76 Lonni End MD Electronically signed by Lonni Hanson MD Signature Date/Time: 08/19/2024/12:16:14 PM    Final    DG Tibia/Fibula Left Result Date: 08/18/2024 CLINICAL DATA:  Fall.  Left-sided pain. EXAM: LEFT TIBIA AND FIBULA - 2 VIEW COMPARISON:  None Available. FINDINGS: No acute fracture involving the left tibia or fibula. Calcaneal spurring. Bilateral soft tissue swelling at the left ankle. Left ankle is located. Left knee is located. Degenerative changes in left knee. IMPRESSION: 1. No acute bone abnormality to the left tibia or fibula. 2. Soft tissue swelling at the left ankle. Electronically Signed   By: Juliene Balder M.D.   On: 08/18/2024 13:41   DG HIP UNILAT WITH PELVIS 2-3 VIEWS LEFT Result Date: 08/18/2024 CLINICAL DATA:  Left-sided pain.  Status post fall. EXAM: DG HIP (WITH OR WITHOUT PELVIS) 2-3V LEFT COMPARISON:  06/17/2024 FINDINGS: Status post right hip arthroplasty. The right femoral stem is not completely imaged. Pelvic bony ring is intact. Left  hip is located without acute fracture. IMPRESSION: 1. No acute abnormality to the left hip. 2. Status post right hip arthroplasty. Electronically Signed   By: Juliene Balder M.D.   On: 08/18/2024 13:39   DG Humerus Left Result Date:  08/18/2024 CLINICAL DATA:  Fall.  Left-sided pain. EXAM: LEFT HUMERUS - 2+ VIEW COMPARISON:  None Available. FINDINGS: Left humerus is intact without a fracture. Mild elevation of the distal left clavicle. Degenerative changes at the left [facility] joint. Evidence for old left rib fractures. Limited evaluation of the left elbow. IMPRESSION: 1. No acute bone abnormality to the left humerus. 2. Mild elevation of the distal left clavicle. Findings could be associated with an age-indeterminate AC joint injury. Recommend clinical correlation in this area. Electronically Signed   By: Juliene Balder M.D.   On: 08/18/2024 13:38   CT Head Wo Contrast Result Date: 08/18/2024 EXAM: CT HEAD WITHOUT 08/18/2024 12:00:32 PM TECHNIQUE: CT of the head was performed without the administration of intravenous contrast. Automated exposure control, iterative reconstruction, and/or weight based adjustment of the mA/kV was utilized to reduce the radiation dose to as low as reasonably achievable. COMPARISON: 06/02/2023 CLINICAL HISTORY: Head trauma, minor (Age >= 65y) FINDINGS: BRAIN AND VENTRICLES: No acute intracranial hemorrhage. No mass effect or midline shift. No extra-axial fluid collection. No evidence of acute infarct. No hydrocephalus. Cerebral ventricle sizes concordant with degree of cerebral volume loss. ORBITS: No acute abnormality. SINUSES AND MASTOIDS: No acute abnormality. SOFT TISSUES AND SKULL: No acute skull fracture. No acute soft tissue abnormality. IMPRESSION: 1. No acute intracranial abnormality related to head trauma. 2. Cerebral ventricle sizes concordant with degree of cerebral volume loss. Electronically signed by: Evalene Coho MD 08/18/2024 12:13 PM EST RP Workstation: [id number]   CT Cervical Spine Wo Contrast Result Date: 08/18/2024 CLINICAL DATA:  Neck trauma (Age >= 65y) EXAM: CT CERVICAL SPINE WITHOUT CONTRAST TECHNIQUE: Multidetector CT imaging of the cervical spine was performed without intravenous  contrast. Multiplanar CT image reconstructions were also generated. RADIATION DOSE REDUCTION: This exam was performed according to the departmental dose-optimization program which includes automated exposure control, adjustment of the mA and/or kV according to patient size and/or use of iterative reconstruction technique. COMPARISON:  MRI cervical spine 07/15/2022. CT cervical spine 02/03/2021. FINDINGS: Alignment: Chronic reversal of the usual cervical lordosis, similar to previous CT. No focal angulation or significant listhesis. Skull base and vertebrae: No evidence of acute cervical spine fracture or traumatic subluxation. Soft tissues and spinal canal: No prevertebral fluid or swelling. No visible canal hematoma. Disc levels: Chronic spondylosis with disc space narrowing, uncinate spurring and facet hypertrophy. Chronic interfacetal ankylosis on the right at C2-3. Chronic foraminal narrowing, greatest on the right at C4-5 and on the left at C6-7. No large disc herniation identified. Upper chest: Clear lung apices. Other: Asymmetric TMJ degenerative changes on the left. IMPRESSION: 1. No evidence of acute cervical spine fracture, traumatic subluxation or static signs of instability. 2. Chronic cervical spondylosis as described. Electronically Signed   By: Elsie Perone M.D.   On: 08/18/2024 12:12   US  Venous Img Lower Bilateral Result Date: 08/02/2024 CLINICAL DATA:  Leg swelling. EXAM: BILATERAL LOWER EXTREMITY VENOUS DOPPLER ULTRASOUND TECHNIQUE: Gray-scale sonography with compression, as well as color and duplex ultrasound, were performed to evaluate the deep venous system(s) from the level of the common femoral vein through the popliteal and proximal calf veins. COMPARISON:  07/14/2022. FINDINGS: VENOUS Normal compressibility of the common femoral, superficial femoral, and popliteal veins, as well  as the visualized calf veins. Visualized portions of profunda femoral vein and great saphenous vein  unremarkable. No filling defects to suggest DVT on grayscale or color Doppler imaging. Doppler waveforms show normal direction of venous flow, normal respiratory plasticity and response to augmentation. Limited views of the contralateral common femoral vein are unremarkable. OTHER Small amount of fluid is noted in the popliteal fossa on the left. Limitations: none IMPRESSION: 1. No evidence of deep venous thrombosis bilaterally. 2. Small amount of fluid in the popliteal fossa on the left. Electronically Signed   By: Leita Birmingham M.D.   On: 08/02/2024 14:40   DG Hip Unilat W or Wo Pelvis 2-3 Views Right Result Date: 08/02/2024 CLINICAL DATA:  Fall.  Pain. EXAM: DG HIP (WITH OR WITHOUT PELVIS) 2-3V RIGHT COMPARISON:  06/17/2024 FINDINGS: Status post right hip replacement. No evidence for periprosthetic fracture. No dislocation. Stable appearance of the right superior inferior pubic rami without definite evidence for acute fracture. SI joints and symphysis pubis unremarkable. Degenerative changes noted lower lumbar spine. IMPRESSION: Status post right hip replacement without evidence for periprosthetic fracture or dislocation. Electronically Signed   By: Camellia Candle M.D.   On: 08/02/2024 08:34    Microbiology: Results for orders placed or performed during the hospital encounter of 08/18/24  Urine Culture     Status: None   Collection Time: 08/18/24  1:20 PM   Specimen: Urine, Random  Result Value Ref Range Status   Specimen Description   Final    URINE, RANDOM Performed at [facility], [address] [place], [place] [address]    Special Requests   Final    NONE Reflexed from [id number] Performed at [facility], [address] [place], [place] [address]    Culture   Final    NO GROWTH Performed at [facility], [address], [place], [place] [address]    Report Status 08/19/2024 FINAL  Final    Labs: CBC: Recent Labs  Lab 08/18/24 1418 08/19/24 0357  08/23/24 0653 08/25/24 0647  WBC 5.0 5.0  --   --   HGB 10.4* 9.2* 10.0* 10.7*  HCT 30.2* 28.3*  --   --   MCV 96.8 94.3  --   --   PLT 211 180  --   --    Basic Metabolic Panel: Recent Labs  Lab 08/19/24 0357 08/21/24 0623 08/22/24 0634 08/23/24 0653 08/25/24 0647  NA 142 140 138 140 139  K 3.8 3.6 3.7 3.9 3.9  CL 107 105 103 103 102  CO2 26 28 29 30 30   GLUCOSE 82 92 86 85 88  BUN 21 13 12 16 20   CREATININE 0.69 0.59 0.60 0.73 0.70  CALCIUM 9.2 8.7* 8.9 9.1 9.4  MG 1.8  --   --   --   --    Liver Function Tests: Recent Labs  Lab 08/18/24 1418 08/21/24 0623  AST 40 54*  ALT 23 12  ALKPHOS 95 90  BILITOT 0.4 0.4  PROT 6.7 5.5*  ALBUMIN 3.7 3.0*   CBG: No results for input(s): GLUCAP in the last 168 hours.  Discharge time spent: greater than 30 minutes.  Signed: Charlie Patterson, MD [facility] 08/25/2024

## 2024-08-25 NOTE — Plan of Care (Signed)

## 2024-08-25 NOTE — Progress Notes (Signed)
 Physical Therapy Treatment Patient Details Name: Michele Brewer MRN: [id number] DOB: 11/12/1950 Today's Date: 08/25/2024   History of Present Illness presented to ER secondary to fall in home environment; admitted for management of syncope, possible UTI.  All imaging (tib/fib, L hip, L humerus, C-spine, CTH) negative for acute injury.  PMH significant for parkinson's disease, recent R THR (posterior THPs, 06/17/24)    PT Comments  Pt is progressing demonstrating basic mobility at an overall Supervision level.  Pt continues to experience limitations to mobility, demonstrating decreased balance reactions for functional standing activities, general weakness, and decreased activity tolerance.  Continued PT will assist pt towards greater dynamic standing balance, LE strengthening, and activity tolerance to increase safety and independence and decrease burden of care with functional mobility.     If plan is discharge home, recommend the following: A lot of help with bathing/dressing/bathroom;Assist for transportation;Help with stairs or ramp for entrance;Assistance with cooking/housework;A little help with walking and/or transfers   Can travel by private vehicle     Yes  Equipment Recommendations  Rolling walker (2 wheels);BSC/3in1    Recommendations for Other Services       Precautions / Restrictions Precautions Precautions: Fall;Posterior Hip Recall of Precautions/Restrictions: Intact Restrictions Weight Bearing Restrictions Per Provider Order: No     Mobility  Bed Mobility               General bed mobility comments: pt up in recliner pre/post PT session    Transfers Overall transfer level: Needs assistance Equipment used: Rolling walker (2 wheels) Transfers: Sit to/from Stand, Bed to chair/wheelchair/BSC Sit to Stand: Supervision   Step pivot transfers: Supervision       General transfer comment: increase time needed.    Ambulation/Gait Ambulation/Gait assistance:  Supervision Gait Distance (Feet): 200 Feet Assistive device: Rolling walker (2 wheels) Gait Pattern/deviations: Narrow base of support, Shuffle, Step-through pattern, Decreased stride length Gait velocity: decreased     General Gait Details: slow but steady, demonstrating good visual scanning and awareness.   Stairs             Wheelchair Mobility     Tilt Bed    Modified Rankin (Stroke Patients Only)       Balance Overall balance assessment: Needs assistance Sitting-balance support: No upper extremity supported, Feet supported Sitting balance-Leahy Scale: Good Sitting balance - Comments: dynamic balance without UE support, no imbalance.   Standing balance support: During functional activity, Reliant on assistive device for balance, Single extremity supported Standing balance-Leahy Scale: Good Standing balance comment: Pt is at high risk of falls due to parkinsonian gait kinematics and hx of previous falls; requires close superivision with dynamic standing activities.                            Communication Communication Communication: No apparent difficulties Factors Affecting Communication: Reduced clarity of speech  Cognition Arousal: Alert Behavior During Therapy: WFL for tasks assessed/performed   PT - Cognitive impairments: No apparent impairments                         Following commands: Intact      Cueing Cueing Techniques: Verbal cues  Exercises      General Comments        Pertinent Vitals/Pain Pain Assessment Pain Assessment: No/denies pain    Home Living  Prior Function            PT Goals (current goals can now be found in the care plan section) Acute Rehab PT Goals Patient Stated Goal: to get better PT Goal Formulation: With patient Time For Goal Achievement: 09/02/24 Potential to Achieve Goals: Good Progress towards PT goals: Progressing toward goals     Frequency    Min 2X/week      PT Plan      Co-evaluation              AM-PAC PT 6 Clicks Mobility   Outcome Measure  Help needed turning from your back to your side while in a flat bed without using bedrails?: A Little Help needed moving from lying on your back to sitting on the side of a flat bed without using bedrails?: A Lot Help needed moving to and from a bed to a chair (including a wheelchair)?: A Little Help needed standing up from a chair using your arms (e.g., wheelchair or bedside chair)?: A Little Help needed to walk in hospital room?: A Little Help needed climbing 3-5 steps with a railing? : A Lot 6 Click Score: 16    End of Session Equipment Utilized During Treatment: Gait belt Activity Tolerance: Patient tolerated treatment well Patient left: in chair;with call bell/phone within reach;with family/visitor present Nurse Communication: Mobility status PT Visit Diagnosis: Muscle weakness (generalized) (M62.81);Difficulty in walking, not elsewhere classified (R26.2)     Time: 8975-8960 PT Time Calculation (min) (ACUTE ONLY): 15 min  Charges:    $Therapeutic Activity: 8-22 mins PT General Charges $$ ACUTE PT VISIT: 1 Visit                     Harland Irving, PTA  08/25/24, 10:52 AM

## 2024-08-25 NOTE — Progress Notes (Signed)
 Narrative for 3 in 1 [facility]  The patient requires the use of a 3 in 1 BSC due to significant mobility and safety limitations. The patient is unable to ambulate safely to the bathroom because of advanced age, weakness, and fall risk. Transferring on and off a standard toilet presents a hazard, as the patient demonstrated decreased balance, endurance and strength.

## 2024-09-08 ENCOUNTER — Encounter (INDEPENDENT_AMBULATORY_CARE_PROVIDER_SITE_OTHER): Payer: Self-pay | Admitting: Vascular Surgery

## 2024-09-08 ENCOUNTER — Ambulatory Visit (INDEPENDENT_AMBULATORY_CARE_PROVIDER_SITE_OTHER): Payer: Self-pay | Admitting: Vascular Surgery

## 2024-09-08 VITALS — BP 104/63 | HR 80 | Resp 17 | Ht 64.0 in | Wt 148.0 lb

## 2024-09-08 DIAGNOSIS — R600 Localized edema: Secondary | ICD-10-CM

## 2024-09-15 ENCOUNTER — Encounter (INDEPENDENT_AMBULATORY_CARE_PROVIDER_SITE_OTHER)

## 2024-09-16 ENCOUNTER — Inpatient Hospital Stay
Admission: EM | Admit: 2024-09-16 | Discharge: 2024-09-25 | DRG: 057 | Disposition: A | Discharge status: Home Health | Attending: Student | Admitting: Student

## 2024-09-16 ENCOUNTER — Emergency Department

## 2024-09-16 DIAGNOSIS — R29898 Other symptoms and signs involving the musculoskeletal system: Secondary | ICD-10-CM | POA: Diagnosis present

## 2024-09-16 DIAGNOSIS — Z96641 Presence of right artificial hip joint: Secondary | ICD-10-CM | POA: Diagnosis present

## 2024-09-16 DIAGNOSIS — E440 Moderate protein-calorie malnutrition: Secondary | ICD-10-CM | POA: Diagnosis present

## 2024-09-16 DIAGNOSIS — R627 Adult failure to thrive: Secondary | ICD-10-CM | POA: Diagnosis not present

## 2024-09-16 DIAGNOSIS — Z8249 Family history of ischemic heart disease and other diseases of the circulatory system: Secondary | ICD-10-CM

## 2024-09-16 DIAGNOSIS — E559 Vitamin D deficiency, unspecified: Secondary | ICD-10-CM | POA: Diagnosis present

## 2024-09-16 DIAGNOSIS — E876 Hypokalemia: Secondary | ICD-10-CM | POA: Diagnosis present

## 2024-09-16 DIAGNOSIS — N390 Urinary tract infection, site not specified: Secondary | ICD-10-CM | POA: Diagnosis present

## 2024-09-16 DIAGNOSIS — G20A1 Parkinson's disease without dyskinesia, without mention of fluctuations: Secondary | ICD-10-CM | POA: Diagnosis present

## 2024-09-16 DIAGNOSIS — G20B1 Parkinson's disease with dyskinesia, without mention of fluctuations: Principal | ICD-10-CM | POA: Diagnosis present

## 2024-09-16 DIAGNOSIS — Z91010 Allergy to peanuts: Secondary | ICD-10-CM

## 2024-09-16 DIAGNOSIS — Z79899 Other long term (current) drug therapy: Secondary | ICD-10-CM

## 2024-09-16 DIAGNOSIS — E531 Pyridoxine deficiency: Secondary | ICD-10-CM | POA: Diagnosis present

## 2024-09-16 DIAGNOSIS — Z6824 Body mass index (BMI) 24.0-24.9, adult: Secondary | ICD-10-CM

## 2024-09-16 DIAGNOSIS — R339 Retention of urine, unspecified: Secondary | ICD-10-CM | POA: Diagnosis present

## 2024-09-16 DIAGNOSIS — Z66 Do not resuscitate: Secondary | ICD-10-CM | POA: Diagnosis present

## 2024-09-16 DIAGNOSIS — R600 Localized edema: Principal | ICD-10-CM | POA: Diagnosis present

## 2024-09-16 DIAGNOSIS — R262 Difficulty in walking, not elsewhere classified: Secondary | ICD-10-CM | POA: Diagnosis present

## 2024-09-16 DIAGNOSIS — I10 Essential (primary) hypertension: Secondary | ICD-10-CM | POA: Diagnosis present

## 2024-09-16 DIAGNOSIS — I959 Hypotension, unspecified: Secondary | ICD-10-CM | POA: Diagnosis present

## 2024-09-16 DIAGNOSIS — Z91018 Allergy to other foods: Secondary | ICD-10-CM

## 2024-09-16 LAB — HEPATIC FUNCTION PANEL
ALT: 11 U/L (ref 0–44)
AST: 50 U/L — ABNORMAL HIGH (ref 15–41)
Albumin: 3.8 g/dL (ref 3.5–5.0)
Alkaline Phosphatase: 114 U/L (ref 38–126)
Bilirubin, Direct: 0.3 mg/dL — ABNORMAL HIGH (ref 0.0–0.2)
Indirect Bilirubin: 0.3 mg/dL (ref 0.3–0.9)
Total Bilirubin: 0.6 mg/dL (ref 0.0–1.2)
Total Protein: 6.5 g/dL (ref 6.5–8.1)

## 2024-09-16 LAB — BASIC METABOLIC PANEL WITH GFR
Anion gap: 11 (ref 5–15)
BUN: 17 mg/dL (ref 8–23)
CO2: 25 mmol/L (ref 22–32)
Calcium: 9.3 mg/dL (ref 8.9–10.3)
Chloride: 104 mmol/L (ref 98–111)
Creatinine, Ser: 0.66 mg/dL (ref 0.44–1.00)
GFR, Estimated: >60 mL/min
Glucose, Bld: 71 mg/dL (ref 70–99)
Potassium: 3.9 mmol/L (ref 3.5–5.1)
Sodium: 139 mmol/L (ref 135–145)

## 2024-09-16 LAB — URINALYSIS, ROUTINE W REFLEX MICROSCOPIC
Bilirubin Urine: NEGATIVE
Glucose, UA: NEGATIVE mg/dL
Hgb urine dipstick: NEGATIVE
Ketones, ur: NEGATIVE mg/dL
Nitrite: NEGATIVE
Protein, ur: NEGATIVE mg/dL
Specific Gravity, Urine: 1.006 (ref 1.005–1.030)
pH: 6 (ref 5.0–8.0)

## 2024-09-16 LAB — CBC
HCT: 31.8 % — ABNORMAL LOW (ref 36.0–46.0)
Hemoglobin: 11 g/dL — ABNORMAL LOW (ref 12.0–15.0)
MCH: 32.6 pg (ref 26.0–34.0)
MCHC: 34.6 g/dL (ref 30.0–36.0)
MCV: 94.4 fL (ref 80.0–100.0)
Platelets: 173 K/uL (ref 150–400)
RBC: 3.37 MIL/uL — ABNORMAL LOW (ref 3.87–5.11)
RDW: 14.3 % (ref 11.5–15.5)
WBC: 5.1 K/uL (ref 4.0–10.5)
nRBC: 0 % (ref 0.0–0.2)

## 2024-09-16 LAB — PRO BRAIN NATRIURETIC PEPTIDE: Pro Brain Natriuretic Peptide: 856 pg/mL — ABNORMAL HIGH

## 2024-09-16 LAB — MAGNESIUM: Magnesium: 2 mg/dL (ref 1.7–2.4)

## 2024-09-16 MED ORDER — FUROSEMIDE 10 MG/ML IJ SOLN
40.0000 mg | Freq: Once | INTRAMUSCULAR | Status: AC
  Administered 2024-09-16: 40 mg via INTRAVENOUS
  Filled 2024-09-16: qty 4

## 2024-09-16 MED ORDER — ACETAMINOPHEN 325 MG PO TABS
650.0000 mg | ORAL_TABLET | Freq: Four times a day (QID) | ORAL | Status: DC | PRN
  Administered 2024-09-17: 650 mg via ORAL
  Filled 2024-09-16: qty 2

## 2024-09-16 MED ORDER — ONDANSETRON HCL 4 MG/2ML IJ SOLN
4.0000 mg | Freq: Four times a day (QID) | INTRAMUSCULAR | Status: DC | PRN
  Administered 2024-09-17: 4 mg via INTRAVENOUS
  Filled 2024-09-16: qty 2

## 2024-09-16 MED ORDER — HEPARIN SODIUM (PORCINE) 5000 UNIT/ML IJ SOLN
5000.0000 [IU] | Freq: Three times a day (TID) | INTRAMUSCULAR | Status: DC
  Administered 2024-09-17 – 2024-09-25 (×25): 5000 [IU] via SUBCUTANEOUS
  Filled 2024-09-16 (×27): qty 1

## 2024-09-16 MED ORDER — SENNOSIDES-DOCUSATE SODIUM 8.6-50 MG PO TABS
1.0000 | ORAL_TABLET | Freq: Every evening | ORAL | Status: DC | PRN
  Administered 2024-09-24: 1 via ORAL
  Filled 2024-09-16: qty 1

## 2024-09-16 MED ORDER — SODIUM CHLORIDE 0.9% FLUSH
3.0000 mL | Freq: Two times a day (BID) | INTRAVENOUS | Status: DC
  Administered 2024-09-17 – 2024-09-25 (×17): 3 mL via INTRAVENOUS

## 2024-09-16 MED ORDER — ONDANSETRON HCL 4 MG PO TABS
4.0000 mg | ORAL_TABLET | Freq: Four times a day (QID) | ORAL | Status: DC | PRN
  Administered 2024-09-23: 4 mg via ORAL
  Filled 2024-09-16: qty 1

## 2024-09-16 MED ORDER — ACETAMINOPHEN 650 MG RE SUPP: 650.0000 mg | Freq: Four times a day (QID) | RECTAL | Status: DC | PRN

## 2024-09-16 NOTE — ED Triage Notes (Signed)
 Refer to first nurse note. Pt reports that she is not taking the lasix  because she doesn't think it is reducing the swelling in her lower extremities. Pt denies CP or increased SHOB from baseline.

## 2024-09-16 NOTE — ED Triage Notes (Signed)
 BIB [facility] from home. C/O bilateral lower leg edema.  Right hip replacement 3 months ago. Does have RX for lasix , but has not been taking. Hx Parkinson with several falls this week.  102/51 79 100

## 2024-09-16 NOTE — ED Notes (Signed)
Pt given box meal

## 2024-09-16 NOTE — ED Notes (Signed)
 Pt brief/bedding changed. Purewick replaced

## 2024-09-16 NOTE — ED Provider Notes (Signed)
 "  [facility] Provider Note    Event Date/Time   First MD Initiated Contact with Patient 09/16/24 1908     (approximate)   History   Chief Complaint Leg Swelling   HPI  Michele Brewer is a 73 y.o. female with past medical history of Parkinson disease and hypertension on midodrine  who presents to the ED complaining of leg swelling.  Patient reports that her legs have been increasingly swollen over the past couple of days and she has stopped taking her Lasix  because she feels it does not help.  She is concerned that the swelling is making it difficult for her to walk and states that she has been unable to walk without significant assistance at home.  She currently lives with her mother and sister, denies any falls.  She denies any difficulty breathing and has not had any decrease in her urine output.     Physical Exam   Triage Vital Signs: ED Triage Vitals  Encounter Vitals Group     BP 09/16/24 1752 119/60     Girls Systolic BP Percentile --      Girls Diastolic BP Percentile --      Boys Systolic BP Percentile --      Boys Diastolic BP Percentile --      Pulse Rate 09/16/24 1752 87     Resp 09/16/24 1752 18     Temp 09/16/24 1752 98 F (36.7 C)     Temp Source 09/16/24 1752 Oral     SpO2 09/16/24 1752 99 %     Weight 09/16/24 1754 147 lb (66.7 kg)     Height 09/16/24 1754 5' 4 (1.626 m)     Head Circumference --      Peak Flow --      Pain Score 09/16/24 1752 0     Pain Loc --      Pain Education --      Exclude from Growth Chart --     Most recent vital signs: Vitals:   09/16/24 1752 09/16/24 1910  BP: 119/60   Pulse: 87   Resp: 18   Temp: 98 F (36.7 C)   SpO2: 99% 100%    Constitutional: Alert and oriented. Eyes: Conjunctivae are normal. Head: Atraumatic. Nose: No congestion/rhinnorhea. Mouth/Throat: Mucous membranes are moist.  Cardiovascular: Normal rate, regular rhythm. Grossly normal heart sounds.  2+ radial and DP pulses  bilaterally. Respiratory: Normal respiratory effort.  No retractions. Lungs CTAB. Gastrointestinal: Soft and nontender. No distention. Musculoskeletal: No lower extremity tenderness, 2+ pitting edema to lower thighs bilaterally. Neurologic:  Normal speech and language.  3 out of 5 strength in bilateral lower extremities, 4 out of 5 strength in bilateral upper extremities.    ED Results / Procedures / Treatments   Labs (all labs ordered are listed, but only abnormal results are displayed) Labs Reviewed  CBC - Abnormal; Notable for the following components:      Result Value   RBC 3.37 (*)    Hemoglobin 11.0 (*)    HCT 31.8 (*)    All other components within normal limits  PRO BRAIN NATRIURETIC PEPTIDE - Abnormal; Notable for the following components:   Pro Brain Natriuretic Peptide 856.0 (*)    All other components within normal limits  HEPATIC FUNCTION PANEL - Abnormal; Notable for the following components:   AST 50 (*)    Bilirubin, Direct 0.3 (*)    All other components within normal limits  URINALYSIS, ROUTINE W  REFLEX MICROSCOPIC - Abnormal; Notable for the following components:   Color, Urine STRAW (*)    APPearance CLEAR (*)    Leukocytes,Ua MODERATE (*)    Bacteria, UA RARE (*)    All other components within normal limits  URINE CULTURE  BASIC METABOLIC PANEL WITH GFR    RADIOLOGY Chest x-ray reviewed and interpreted by me with no infiltrate, edema, or effusion.  PROCEDURES:  Critical Care performed: No  Procedures   MEDICATIONS ORDERED IN ED: Medications  furosemide  (LASIX ) injection 40 mg (40 mg Intravenous Given 09/16/24 2015)     IMPRESSION / MDM / ASSESSMENT AND PLAN / ED COURSE  I reviewed the triage vital signs and the nursing notes.                              73 y.o. female with past medical history of Parkinson's disease and hypotension on midodrine  who presents to the ED complaining of increasing leg swelling and difficulty  walking.  Patient's presentation is most consistent with acute presentation with potential threat to life or bodily function.  Differential diagnosis includes, but is not limited to, renal failure, liver failure, lymphedema, CHF, peripheral edema, electrolyte abnormality.  Patient well-appearing and in no acute distress, vital signs are unremarkable.  She does have significant edema to her lower extremities, which could be contributing to her difficulty walking, however I am also concerned about her advanced Parkinson's disease.  She has no symptoms to suggest CHF and echocardiogram from last month showed normal EF.  Labs thus far without significant anemia, leukocytosis, electrolyte abnormality, or AKI.  We will add on LFTs and diurese with IV Lasix , reassess.  Chest x-ray with possible mild central vascular congestion, no focal infiltrate.  Patient has diuresed some but continues to have significant swelling in her legs limiting her ability to ambulate and care for herself at home.  Case discussed with hospitalist for further management of peripheral edema, weakness, and difficulty walking.      FINAL CLINICAL IMPRESSION(S) / ED DIAGNOSES   Final diagnoses:  Peripheral edema  Parkinson's disease with dyskinesia, unspecified whether manifestations fluctuate (HCC)     Rx / DC Orders   ED Discharge Orders     None        Note:  This document was prepared using Dragon voice recognition software and may include unintentional dictation errors.   Willo Dunnings, MD 09/16/24 [phone]  "

## 2024-09-16 NOTE — H&P (Incomplete)
 " History and Physical    Ortha [name]:[id number] DOB: 1950/12/24 DOA: 09/16/2024  DOS: the patient was seen and examined on 09/16/2024  PCP: [name], MD   Patient coming from: Home  I have personally briefly reviewed patient's old medical records in [facility] and CareEverywhere  HPI:   Michele Brewer is a 73 y.o. year old female with medical history of Parkinson's disease, MDD presenting to the ED with worsening lower extremity edema making ambulation difficult. States it has been worsening for the last 6 weeks. She states it was abrupt weakness in her legs. States swelling and weakness occurred together. She was able to do her ADLs and iADLs prior to this but has been unable to to them. Denies any URI symptoms, dysuria, fevers or chills. Denies any back pain. Was started on lasix  but that has not been helping. On arrival to the ED patient was noted to be HDS stable. Lab work and imaging obtained. CBC without leukocytosis, mild anemia at baseline. BMP unremarkable. UA with signs of infection but no symptoms. Urine culture pending. ProBNP elevated at 856. CXR with low lung volumes but otherwise clear. Given pt's edema making it difficult for patient to ambulate, [facility] contacted for admission.  Review of Systems: As mentioned in the history of present illness. All other systems reviewed and are negative.   Past Medical History:  Diagnosis Date   Depression    Elevated CK 06/03/2023   Parkinson disease [facility])     Past Surgical History:  Procedure Laterality Date   CESAREAN SECTION     HIP ARTHROPLASTY Right 06/17/2024   Procedure: HEMIARTHROPLASTY (BIPOLAR) HIP, POSTERIOR APPROACH FOR FRACTURE;  Surgeon: Edie Norleen PARAS, MD;  Location: [facility] ORS;  Service: Orthopedics;  Laterality: Right;     Allergies[1]  Family History  Problem Relation Age of Onset   Gout Mother    Heart disease Father     Prior to Admission medications  Medication Sig Start Date End Date  Taking? Authorizing Provider  acetaminophen  (TYLENOL ) 325 MG tablet Take 2 tablets (650 mg total) by mouth every 6 (six) hours as needed for mild pain, moderate pain, fever or headache (or Fever >/= 101). 07/18/22   Josette Ade, MD  carbidopa -levodopa  (SINEMET  IR) 25-100 MG tablet Take 1.5 tablets by mouth 2 (two) times daily. 07/18/22   Josette Ade, MD  carbidopa -levodopa  (SINEMET  IR) 25-100 MG tablet Take 1.5 tablets by mouth at bedtime. 07/18/22   Josette Ade, MD  cyanocobalamin  1000 MCG tablet Take 1 tablet (1,000 mcg total) by mouth daily. 08/25/24   Josette Ade, MD  docusate sodium  (COLACE) 100 MG capsule Take 1 capsule (100 mg total) by mouth 2 (two) times daily. Patient not taking: Reported on 09/08/2024 06/19/24   Patel, Sona, MD  fluticasone  (FLONASE ) 50 MCG/ACT nasal spray Place 2 sprays into both nostrils daily. Patient not taking: Reported on 09/08/2024 06/10/23   Fausto Sor A, DO  folic acid  (FOLVITE ) 1 MG tablet Take 1 tablet (1 mg total) by mouth daily. Patient not taking: Reported on 09/08/2024 07/19/22   Josette Ade, MD  furosemide  (LASIX ) 20 MG tablet Take 1 tablet (20 mg total) by mouth 2 (two) times daily. 08/25/24   Josette Ade, MD  midodrine  (PROAMATINE ) 5 MG tablet Take 1 tablet (5 mg total) by mouth 3 (three) times daily with meals. 08/25/24   Josette Ade, MD  Multiple Vitamin (MULTIVITAMIN WITH MINERALS) TABS tablet Take 1 tablet by mouth daily. Patient not taking: Reported on  09/08/2024 07/19/22   Josette Ade, MD  Nutritional Supplements (FEEDING SUPPLEMENT, KATE FARMS STANDARD ENT 1.4,) LIQD liquid Take 325 mLs by mouth 3 (three) times daily between meals. 08/25/24   Josette Ade, MD  polyethylene glycol (MIRALAX  / GLYCOLAX ) 17 g packet Take 17 g by mouth daily as needed for moderate constipation. 07/18/22   Josette Ade, MD  Pyridoxine  HCl (VITAMIN B6) 100 MG TABS Take 1 tablet (100 mg total) by mouth daily. 08/25/24   Josette Ade, MD  Vitamin D , Ergocalciferol , (DRISDOL ) 1.25 MG (50000 UNIT) CAPS capsule Take 1 capsule (50,000 Units total) by mouth once a week. 06/06/24       Social History:  reports that she has never smoked. She has never used smokeless tobacco. She reports that she does not currently use alcohol . She reports that she does not use drugs. States she lives with her mother, denies smoking or drinking. Was independent in ADLs and iADLs.    Physical Exam: Vitals:   09/16/24 1752 09/16/24 1754 09/16/24 1910 09/17/24 0008  BP: 119/60   (!) 112/56  Pulse: 87   85  Resp: 18   18  Temp: 98 F (36.7 C)   99.3 F (37.4 C)  TempSrc: Oral   Axillary  SpO2: 99%  100% 98%  Weight:  66.7 kg    Height:  5' 4 (1.626 m)      Gen: NAD, chronically ill appearing HENT: NCAT CV: normal heart sounds Lung: CTAB Abd: No TTP, normal bowel sounds GU: rectal exam shows good tone MSK: No asymmetry, decreased muscle strength. Non pitting lower extremity edema Neuro: alert and oriented. No neuro deficits. Bilateral LE weakness with 2/5. Upper extremity intact strength and sensation.    Labs on Admission: I have personally reviewed following labs and imaging studies  CBC: Recent Labs  Lab 09/16/24 1758  WBC 5.1  HGB 11.0*  HCT 31.8*  MCV 94.4  PLT 173   Basic Metabolic Panel: Recent Labs  Lab 09/16/24 1758 09/16/24 1953  NA 139  --   K 3.9  --   CL 104  --   CO2 25  --   GLUCOSE 71  --   BUN 17  --   CREATININE 0.66  --   CALCIUM 9.3  --   MG  --  2.0   GFR: Estimated Creatinine Clearance: 58 mL/min (by C-G formula based on SCr of 0.66 mg/dL). Liver Function Tests: Recent Labs  Lab 09/16/24 1758  AST 50*  ALT 11  ALKPHOS 114  BILITOT 0.6  PROT 6.5  ALBUMIN 3.8   No results for input(s): LIPASE, AMYLASE in the last 168 hours. No results for input(s): AMMONIA in the last 168 hours. Coagulation Profile: No results for input(s): INR, PROTIME in the last 168  hours. Cardiac Enzymes: No results for input(s): CKTOTAL, CKMB, CKMBINDEX, TROPONINI, TROPONINIHS in the last 168 hours. BNP (last 3 results) No results for input(s): BNP in the last 8760 hours. HbA1C: No results for input(s): HGBA1C in the last 72 hours. CBG: No results for input(s): GLUCAP in the last 168 hours. Lipid Profile: No results for input(s): CHOL, HDL, LDLCALC, TRIG, CHOLHDL, LDLDIRECT in the last 72 hours. Thyroid  Function Tests: No results for input(s): TSH, T4TOTAL, FREET4, T3FREE, THYROIDAB in the last 72 hours. Anemia Panel: No results for input(s): VITAMINB12, FOLATE, FERRITIN, TIBC, IRON , RETICCTPCT in the last 72 hours. Urine analysis:    Component Value Date/Time   COLORURINE STRAW (A) 09/16/2024 1953  APPEARANCEUR CLEAR (A) 09/16/2024 1953   LABSPEC 1.006 09/16/2024 1953   PHURINE 6.0 09/16/2024 1953   GLUCOSEU NEGATIVE 09/16/2024 1953   HGBUR NEGATIVE 09/16/2024 1953   BILIRUBINUR NEGATIVE 09/16/2024 1953   KETONESUR NEGATIVE 09/16/2024 1953   PROTEINUR NEGATIVE 09/16/2024 1953   NITRITE NEGATIVE 09/16/2024 1953   LEUKOCYTESUR MODERATE (A) 09/16/2024 1953    Radiological Exams on Admission: I have personally reviewed images DG Chest 2 View Result Date: 09/16/2024 CLINICAL DATA:  Shortness of breath and bilateral lower extremity edema. EXAM: CHEST - 2 VIEW COMPARISON:  06/02/2023 FINDINGS: The cardiac silhouette, mediastinal and hilar contours are within normal limits and stable. Mild tortuosity and calcification of the thoracic aorta. Low lung volumes with vascular crowding and streaky bibasilar atelectasis. There may be mild central vascular congestion but no pulmonary edema or pleural effusions. No pulmonary lesions or pneumothorax. The bony thorax is intact. Remote healed left rib fractures again noted. IMPRESSION: 1. Low lung volumes with vascular crowding and streaky bibasilar atelectasis. 2. Possible  mild central vascular congestion but no pulmonary edema or pleural effusions. Electronically Signed   By: MYRTIS Stammer M.D.   On: 09/16/2024 19:44       Assessment/Plan Principal Problem:   Failure to thrive in adult Active Problems:   Lower extremity weakness   Lower extremity edema   Parkinson disease (HCC)   UTI (urinary tract infection)   Patient with overall failure to thrive in setting of decreased ambulation worsening dependence for ADLs and IADLs.  Appears her presentation is multifactorial in setting of advancing Parkinson's disease but her report suggest her lower extremity weakness with normal acute in onset than gradual.  Although on chart review it does appear she was walking earlier this month with physical therapy which she has this may be a gradual decline.  Differential diagnosis to her lower extremity weakness and edema include DVT versus spinal pathology versus progression of MS versus nutritional deficiencies versus deconditioning.  Edema is nonpitting. Will get ultrasound of bilateral lower extremities to rule out DVT and get MRI of lumbar spine although the rectal exam suggest against spinal cord pathology but she does report some bowel incontinence. Levodopa /carbidopa  side effect can lead to lower extremity swelling as well. Will also get B12 level as it was borderline last time.  Will consult PT and OT.  If workup is unrevealing he may benefit from neurology consultation. Order compression socks.   Parkinson's disease: Continue home medications once med reconciliation is completed.   UTI?  UA concerning for urinary tract infection but no dysuria.  Will hold off treatment until urine cultures result.  VTE prophylaxis:  SQ Heparin   Diet: HH Code Status:  DNR with Intubation Telemetry:  Admission status: Observation, Med-Surg Patient is from: Home Anticipated d/c is to: Home Anticipated d/c is in: 1-2 days   Family Communication: Updated at bedside  Consults  called: None   Severity of Illness: The appropriate patient status for this patient is OBSERVATION. Observation status is judged to be reasonable and necessary in order to provide the required intensity of service to ensure the patient's safety. The patient's presenting symptoms, physical exam findings, and initial radiographic and laboratory data in the context of their medical condition is felt to place them at decreased risk for further clinical deterioration. Furthermore, it is anticipated that the patient will be medically stable for discharge from the hospital within 2 midnights of admission.    Morene Bathe, MD [facility]      [  1]  Allergies Allergen Reactions   Citrus     rash   Peanut-Containing Drug Products     Rash and itch   "

## 2024-09-17 ENCOUNTER — Observation Stay

## 2024-09-17 ENCOUNTER — Other Ambulatory Visit: Payer: Self-pay

## 2024-09-17 DIAGNOSIS — Z91010 Allergy to peanuts: Secondary | ICD-10-CM | POA: Diagnosis not present

## 2024-09-17 DIAGNOSIS — Z9889 Other specified postprocedural states: Secondary | ICD-10-CM | POA: Diagnosis not present

## 2024-09-17 DIAGNOSIS — G20B1 Parkinson's disease with dyskinesia, without mention of fluctuations: Secondary | ICD-10-CM | POA: Diagnosis present

## 2024-09-17 DIAGNOSIS — E440 Moderate protein-calorie malnutrition: Secondary | ICD-10-CM | POA: Diagnosis present

## 2024-09-17 DIAGNOSIS — R627 Adult failure to thrive: Secondary | ICD-10-CM

## 2024-09-17 DIAGNOSIS — R339 Retention of urine, unspecified: Secondary | ICD-10-CM | POA: Diagnosis present

## 2024-09-17 DIAGNOSIS — G20B2 Parkinson's disease with dyskinesia, with fluctuations: Secondary | ICD-10-CM | POA: Diagnosis not present

## 2024-09-17 DIAGNOSIS — E876 Hypokalemia: Secondary | ICD-10-CM | POA: Diagnosis present

## 2024-09-17 DIAGNOSIS — I10 Essential (primary) hypertension: Secondary | ICD-10-CM | POA: Diagnosis present

## 2024-09-17 DIAGNOSIS — G20A1 Parkinson's disease without dyskinesia, without mention of fluctuations: Secondary | ICD-10-CM | POA: Diagnosis not present

## 2024-09-17 DIAGNOSIS — E531 Pyridoxine deficiency: Secondary | ICD-10-CM | POA: Diagnosis present

## 2024-09-17 DIAGNOSIS — I959 Hypotension, unspecified: Secondary | ICD-10-CM | POA: Diagnosis present

## 2024-09-17 DIAGNOSIS — E559 Vitamin D deficiency, unspecified: Secondary | ICD-10-CM | POA: Diagnosis present

## 2024-09-17 DIAGNOSIS — Z79899 Other long term (current) drug therapy: Secondary | ICD-10-CM | POA: Diagnosis not present

## 2024-09-17 DIAGNOSIS — G959 Disease of spinal cord, unspecified: Secondary | ICD-10-CM | POA: Diagnosis not present

## 2024-09-17 DIAGNOSIS — Z96641 Presence of right artificial hip joint: Secondary | ICD-10-CM | POA: Diagnosis present

## 2024-09-17 DIAGNOSIS — Z66 Do not resuscitate: Secondary | ICD-10-CM | POA: Diagnosis present

## 2024-09-17 DIAGNOSIS — Z8249 Family history of ischemic heart disease and other diseases of the circulatory system: Secondary | ICD-10-CM | POA: Diagnosis not present

## 2024-09-17 DIAGNOSIS — Z6824 Body mass index (BMI) 24.0-24.9, adult: Secondary | ICD-10-CM | POA: Diagnosis not present

## 2024-09-17 DIAGNOSIS — R600 Localized edema: Secondary | ICD-10-CM | POA: Diagnosis present

## 2024-09-17 DIAGNOSIS — Z91018 Allergy to other foods: Secondary | ICD-10-CM | POA: Diagnosis not present

## 2024-09-17 DIAGNOSIS — R262 Difficulty in walking, not elsewhere classified: Secondary | ICD-10-CM | POA: Diagnosis present

## 2024-09-17 DIAGNOSIS — G6289 Other specified polyneuropathies: Secondary | ICD-10-CM | POA: Diagnosis not present

## 2024-09-17 LAB — COMPREHENSIVE METABOLIC PANEL WITH GFR
ALT: 22 U/L (ref 0–44)
AST: 49 U/L — ABNORMAL HIGH (ref 15–41)
Albumin: 3.5 g/dL (ref 3.5–5.0)
Alkaline Phosphatase: 106 U/L (ref 38–126)
Anion gap: 9 (ref 5–15)
BUN: 18 mg/dL (ref 8–23)
CO2: 28 mmol/L (ref 22–32)
Calcium: 9.3 mg/dL (ref 8.9–10.3)
Chloride: 104 mmol/L (ref 98–111)
Creatinine, Ser: 0.8 mg/dL (ref 0.44–1.00)
GFR, Estimated: >60 mL/min
Glucose, Bld: 106 mg/dL — ABNORMAL HIGH (ref 70–99)
Potassium: 3.3 mmol/L — ABNORMAL LOW (ref 3.5–5.1)
Sodium: 141 mmol/L (ref 135–145)
Total Bilirubin: 0.4 mg/dL (ref 0.0–1.2)
Total Protein: 6 g/dL — ABNORMAL LOW (ref 6.5–8.1)

## 2024-09-17 LAB — FOLATE: Folate: 10.7 ng/mL

## 2024-09-17 LAB — CBC
HCT: 28.6 % — ABNORMAL LOW (ref 36.0–46.0)
Hemoglobin: 10.3 g/dL — ABNORMAL LOW (ref 12.0–15.0)
MCH: 33.9 pg (ref 26.0–34.0)
MCHC: 36 g/dL (ref 30.0–36.0)
MCV: 94.1 fL (ref 80.0–100.0)
Platelets: 173 K/uL (ref 150–400)
RBC: 3.04 MIL/uL — ABNORMAL LOW (ref 3.87–5.11)
RDW: 14.3 % (ref 11.5–15.5)
WBC: 4.3 K/uL (ref 4.0–10.5)
nRBC: 0 % (ref 0.0–0.2)

## 2024-09-17 LAB — VITAMIN B12: Vitamin B-12: 655 pg/mL (ref 180–914)

## 2024-09-17 LAB — IRON AND TIBC
Iron: 26 ug/dL — ABNORMAL LOW (ref 28–170)
Saturation Ratios: 9 % — ABNORMAL LOW (ref 10.4–31.8)
TIBC: 298 ug/dL (ref 250–450)
UIBC: 272 ug/dL

## 2024-09-17 LAB — CK: Total CK: 699 U/L — ABNORMAL HIGH (ref 38–234)

## 2024-09-17 LAB — PHOSPHORUS: Phosphorus: 3.7 mg/dL (ref 2.5–4.6)

## 2024-09-17 MED ORDER — CARBIDOPA-LEVODOPA 25-100 MG PO TABS: 1.5000 | ORAL_TABLET | Freq: Every day | ORAL | Status: DC

## 2024-09-17 MED ORDER — BENZTROPINE MESYLATE 1 MG PO TABS
1.0000 mg | ORAL_TABLET | Freq: Two times a day (BID) | ORAL | Status: DC
  Administered 2024-09-17 – 2024-09-19 (×5): 1 mg via ORAL
  Filled 2024-09-17 (×5): qty 1

## 2024-09-17 MED ORDER — POTASSIUM CHLORIDE CRYS ER 20 MEQ PO TBCR
40.0000 meq | EXTENDED_RELEASE_TABLET | Freq: Once | ORAL | Status: AC
  Administered 2024-09-17: 40 meq via ORAL
  Filled 2024-09-17: qty 2

## 2024-09-17 MED ORDER — CARBIDOPA-LEVODOPA 25-100 MG PO TABS
1.5000 | ORAL_TABLET | Freq: Two times a day (BID) | ORAL | Status: DC
  Administered 2024-09-17 – 2024-09-19 (×5): 1.5 via ORAL
  Filled 2024-09-17 (×5): qty 2

## 2024-09-17 MED ORDER — VITAMIN E 45 MG (100 UNIT) PO CAPS
400.0000 [IU] | ORAL_CAPSULE | Freq: Every day | ORAL | Status: DC
  Administered 2024-09-17 – 2024-09-25 (×9): 400 [IU] via ORAL
  Filled 2024-09-17 (×9): qty 4

## 2024-09-17 MED ORDER — FUROSEMIDE 10 MG/ML IJ SOLN
40.0000 mg | Freq: Two times a day (BID) | INTRAMUSCULAR | Status: AC
  Administered 2024-09-17 – 2024-09-19 (×4): 40 mg via INTRAVENOUS
  Filled 2024-09-17 (×4): qty 4

## 2024-09-17 MED ORDER — SODIUM CHLORIDE 0.9 % IV SOLN
1.0000 g | INTRAVENOUS | Status: DC
  Administered 2024-09-17 – 2024-09-18 (×2): 1 g via INTRAVENOUS
  Filled 2024-09-17 (×3): qty 10

## 2024-09-17 MED ORDER — CHLORHEXIDINE GLUCONATE CLOTH 2 % EX PADS
6.0000 | MEDICATED_PAD | Freq: Every day | CUTANEOUS | Status: DC
  Administered 2024-09-17 – 2024-09-21 (×5): 6 via TOPICAL

## 2024-09-17 MED ORDER — PYRIDOXINE HCL 25 MG PO TABS
100.0000 mg | ORAL_TABLET | Freq: Every day | ORAL | Status: DC
  Administered 2024-09-17 – 2024-09-25 (×9): 100 mg via ORAL
  Filled 2024-09-17 (×9): qty 4

## 2024-09-17 NOTE — ED Notes (Signed)
 Pt bed/linen soiled w/ urine. Pt cleaned, linen changed, new brief and purwick placed at this time. Pt felt warm to touch, axillary temp of 99.3.SABRA see MAR

## 2024-09-17 NOTE — Evaluation (Signed)
 Physical Therapy Evaluation Patient Details Name: Michele Brewer MRN: [id number] DOB: 1950-11-28 Today's Date: 09/17/2024  History of Present Illness  Pt is a 73 y.o. year old female with medical history of Parkinson's disease, MDD presenting to the ED with worsening lower extremity edema making ambulation difficult.  MD assessment includes: FTT, LE weakness, LE edema, and UTI.   Clinical Impression  Pt was pleasant and motivated to participate during the session and put forth good effort throughout. Pt required significant physical assist to go from sidelying to sitting at the EOB for both LE and trunk management.  Once in sitting pt presented with no adverse symptoms and was steady in sitting throughout.  Pt was able to perform multiple sit to/from stands to both a RW and then a sara stedy lift with only +2 min A and cues for sequencing.  Multiple attemps were made to side step at the EOB while standing with the RW with pt able to minimally advance the RLE but unable to advance the LLE.  Pt will benefit from continued PT services upon discharge to safely address deficits listed in patient problem list for decreased caregiver assistance and eventual return to PLOF.          If plan is discharge home, recommend the following: Two people to help with walking and/or transfers;A lot of help with bathing/dressing/bathroom;Assistance with cooking/housework;Assist for transportation   Can travel by private vehicle   No    Equipment Recommendations Other (comment) (TBD at next venue of care)  Recommendations for Other Services       Functional Status Assessment Patient has had a recent decline in their functional status and/or demonstrates limited ability to make significant improvements in function in a reasonable and predictable amount of time     Precautions / Restrictions Precautions Precautions: Fall Restrictions Weight Bearing Restrictions Per Provider Order: No      Mobility  Bed  Mobility Overal bed mobility: Needs Assistance Bed Mobility: Rolling, Sidelying to Sit Rolling: Min assist, Used rails Sidelying to sit: Max assist, Used rails       General bed mobility comments: max A for BLE and trunk control during sidelying to sit with cues for sequencing and use of rails    Transfers Overall transfer level: Needs assistance Equipment used: Rolling walker (2 wheels) Transfers: Sit to/from Stand Sit to Stand: Min assist, +2 physical assistance, +2 safety/equipment, From elevated surface           General transfer comment: Pt able to come to standing to both a RW and then to a sara stedy lift with +2 min A and cues for general sequencing Transfer via Lift Equipment: Stedy  Ambulation/Gait               General Gait Details: Multiple attemps made to side step at the EOB with pt able to minimally advance the RLE but unable to advance the LLE  Stairs            Wheelchair Mobility     Tilt Bed    Modified Rankin (Stroke Patients Only)       Balance Overall balance assessment: Needs assistance Sitting-balance support: No upper extremity supported, Feet supported, Feet unsupported Sitting balance-Leahy Scale: Normal     Standing balance support: Bilateral upper extremity supported, Reliant on assistive device for balance Standing balance-Leahy Scale: Fair  Pertinent Vitals/Pain Pain Assessment Pain Assessment: No/denies pain    Home Living Family/patient expects to be discharged to:: Private residence Living Arrangements: Parent;Other relatives Available Help at Discharge: Family;Available PRN/intermittently Type of Home: House Home Access: Level entry       Home Layout: One level Home Equipment: Cane - single Librarian, Academic (2 wheels);Shower seat;Grab bars - toilet;Grab bars - tub/shower Additional Comments: Lives with mother and sister, intermittent assist only    Prior  Function               Mobility Comments: At baseline is Mod Ind with amb with a RW since her R hip hemiarthroplasty but recently has been getting weaker to the point where she has been having difficulty ambulating even limited household distances, reports 2 falls in the last two months due to BLEs buckling. ADLs Comments: Mod Ind with ADLs at baseline     Extremity/Trunk Assessment   Upper Extremity Assessment Upper Extremity Assessment: Generalized weakness    Lower Extremity Assessment Lower Extremity Assessment: Generalized weakness       Communication   Communication Communication: No apparent difficulties    Cognition Arousal: Alert Behavior During Therapy: WFL for tasks assessed/performed   PT - Cognitive impairments: No apparent impairments                         Following commands: Intact       Cueing Cueing Techniques: Verbal cues, Tactile cues, Visual cues     General Comments      Exercises     Assessment/Plan    PT Assessment Patient needs continued PT services  PT Problem List Decreased strength;Decreased activity tolerance;Decreased balance;Decreased mobility;Decreased knowledge of use of DME       PT Treatment Interventions DME instruction;Gait training;Functional mobility training;Therapeutic activities;Therapeutic exercise;Balance training;Patient/family education    PT Goals (Current goals can be found in the Care Plan section)  Acute Rehab PT Goals Patient Stated Goal: To get stronger PT Goal Formulation: With patient Time For Goal Achievement: 09/30/24 Potential to Achieve Goals: Fair    Frequency Min 2X/week     Co-evaluation               AM-PAC PT 6 Clicks Mobility  Outcome Measure Help needed turning from your back to your side while in a flat bed without using bedrails?: A Little Help needed moving from lying on your back to sitting on the side of a flat bed without using bedrails?: A Lot Help needed  moving to and from a bed to a chair (including a wheelchair)?: Total Help needed standing up from a chair using your arms (e.g., wheelchair or bedside chair)?: A Lot Help needed to walk in hospital room?: Total Help needed climbing 3-5 steps with a railing? : Total 6 Click Score: 10    End of Session Equipment Utilized During Treatment: Gait belt Activity Tolerance: Patient tolerated treatment well Patient left: in chair;with call bell/phone within reach;with nursing/sitter in room Nurse Communication: Mobility status;Other (comment) (CNA in room at end of session and notified that there was no box to plug the pt's chair alarm into) PT Visit Diagnosis: Unsteadiness on feet (R26.81);History of falling (Z91.81);Difficulty in walking, not elsewhere classified (R26.2);Muscle weakness (generalized) (M62.81)    Time: 8597-8561 PT Time Calculation (min) (ACUTE ONLY): 36 min   Charges:   PT Evaluation $PT Eval Moderate Complexity: 1 Mod PT Treatments $Therapeutic Activity: 8-22 mins PT General Charges $$ ACUTE PT VISIT:  1 Visit       D. [name] PT, DPT 09/17/2024, 2:54 PM

## 2024-09-17 NOTE — Progress Notes (Signed)
 [facility] Progress Note  Patient: Michele Brewer    FMW:[id number]  DOA: 09/16/2024     Date of Service: the patient was seen and examined on 09/17/2024  Chief Complaint  Patient presents with   Leg Swelling   Brief hospital course:  Zarria Towell is a 73 y.o. year old female with medical history of Parkinson's disease, MDD presenting to the ED with worsening lower extremity edema making ambulation difficult. States it has been worsening for the last 6 weeks. She states it was abrupt weakness in her legs. States swelling and weakness occurred together. She was able to do her ADLs and iADLs prior to this but has been unable to to them.   ED workup: HDS stable.  Labs: CBC without leukocytosis, mild anemia at baseline.  BMP unremarkable. UA with signs of infection but no symptoms.  Urine culture pending. ProBNP elevated at 856.  CXR with low lung volumes but otherwise clear. Given pt's edema making it difficult for patient to ambulate,  [facility] contacted for admission.    Assessment and Plan:  # Parkinson disease, presented with lower extremity stiffness and difficulty ambulation Continue Sinemet  Started Cogentin 1 mg p.o. twice daily Continue fall precaution PT and OT eval  Elevated CK level CK 699 It could be secondary to immobility. Cannot give IV fluid because of lower extremity edema, patient is getting Lasix  for diuresis Check CK level daily   # Lower extremity edema could be HFpEF proBNP elevated TTE shows LVEF 60 to 65% Started Lasix  40 mg IV twice daily Monitor urine output and renal function stat Venous duplex negative for DVT   Possible UTI UA positive Started ceftriaxone  1 g IV daily Follow urine culture   # Urinary retention Bladder scan 286 Foley catheter inserted Follow voiding trial after few days  # Vitamin E and vitamin B6 deficiency Started vitamin E and vitamin B6 oral supplement B12 and folate within normal range  # Hypokalemia,  potassium repleted. Monitor electrolytes and replete as needed.   Body mass index is 25.13 kg/m (pended).  Interventions:  Diet: Regular diet, fluid restriction 1.5-2 point DVT Prophylaxis: Subcutaneous Heparin     Advance goals of care discussion: DNR-intermittent  Family Communication: family was not present at bedside, at the time of interview.  The pt provided permission to discuss medical plan with the family. Opportunity was given to ask question and all questions were answered satisfactorily.   Disposition:  Pt is from home, admitted with lower extremity weakness due to Parkinson, still has weakness, which precludes a safe discharge. Discharge to SNF, when stable, most likely 1 to 2 days.  Subjective: No significant events overnight, patient was complaining of weakness of bilateral lower extremity which happened gradually.  Edema of lower extremities, denied any other complaints.   Physical Exam: General: NAD, lying comfortably Appear in no distress, affect appropriate Eyes: PERRLA ENT: Oral Mucosa Clear, moist  Neck: no JVD,  Cardiovascular: S1 and S2 Present, no Murmur,  Respiratory: good respiratory effort, Bilateral Air entry equal and Decreased, no Crackles, no wheezes Abdomen: Bowel Sound present, Soft and no tenderness,  Skin: no rashes Extremities: 2-3+ pedal edema, no calf tenderness Neurologic: Bilateral lower extremity stiffness most likely due to Parkinson disease, no any other focal deficits.   Gait not checked due to patient safety concerns  Vitals:   09/17/24 0200 09/17/24 0438 09/17/24 0843 09/17/24 1329  BP:  (!) 117/53 121/67 109/64  Pulse:  70 72 73  Resp:  20 18  Temp:  99.6 F (37.6 C) 98.3 F (36.8 C)   TempSrc:  Oral    SpO2:  97% 96% 97%  Weight: (P) 66.4 kg     Height: (P) 5' 4 (1.626 m)       Intake/Output Summary (Last 24 hours) at 09/17/2024 1526 Last data filed at 09/17/2024 1300 Gross per 24 hour  Intake 0 ml  Output 450 ml   Net -450 ml   Filed Weights   09/16/24 1754 09/17/24 0200  Weight: 66.7 kg (P) 66.4 kg    Data Reviewed: I have personally reviewed and interpreted daily labs, tele strips, imagings as discussed above. I reviewed all nursing notes, pharmacy notes, vitals, pertinent old records I have discussed plan of care as described above with RN and patient/family.  CBC: Recent Labs  Lab 09/16/24 1758 09/17/24 0429  WBC 5.1 4.3  HGB 11.0* 10.3*  HCT 31.8* 28.6*  MCV 94.4 94.1  PLT 173 173   Basic Metabolic Panel: Recent Labs  Lab 09/16/24 1758 09/16/24 1953 09/17/24 0428 09/17/24 0429  NA 139  --   --  141  K 3.9  --   --  3.3*  CL 104  --   --  104  CO2 25  --   --  28  GLUCOSE 71  --   --  106*  BUN 17  --   --  18  CREATININE 0.66  --   --  0.80  CALCIUM 9.3  --   --  9.3  MG  --  2.0  --   --   PHOS  --   --  3.7  --     Studies: US  Venous Img Lower Bilateral (DVT) Result Date: 09/17/2024 EXAM: ULTRASOUND DUPLEX OF THE BILATERAL LOWER EXTREMITY VEINS TECHNIQUE: Duplex ultrasound using B-mode/gray scaled imaging and Doppler spectral analysis and color flow was obtained of the deep venous structures of the bilateral lower extremity. COMPARISON: US  Extremity Low Venous 08/02/2024. CLINICAL HISTORY: Lower extremity edema. FINDINGS: LEFT: The common femoral vein, femoral vein, popliteal vein, and posterior tibial vein demonstrate normal compressibility with normal color flow and spectral analysis. RIGHT: The common femoral vein, femoral vein, popliteal vein, and posterior tibial vein demonstrate normal compressibility with normal color flow and spectral analysis. IMPRESSION: 1. No evidence of DVT. Electronically signed by: Katheleen Faes MD 09/17/2024 01:06 PM EST RP Workstation: [id number]   MR LUMBAR SPINE WO CONTRAST Result Date: 09/17/2024 EXAM: MRI LUMBAR SPINE 09/17/2024 01:29:14 AM TECHNIQUE: Multiplanar multisequence MRI of the lumbar spine was performed without the  administration of intravenous contrast. COMPARISON: None available. CLINICAL HISTORY: Low back pain, cauda equina syndrome suspected. FINDINGS: BONES AND ALIGNMENT: Normal alignment. Normal vertebral body heights. Bone marrow signal is unremarkable. SPINAL CORD: The conus terminates normally. SOFT TISSUES: No paraspinal mass. L1-L2: No significant disc herniation. No spinal canal stenosis or neural foraminal narrowing. L2-L3: No significant disc herniation. No spinal canal stenosis or neural foraminal narrowing. L3-L4: No significant disc herniation. No spinal canal stenosis or neural foraminal narrowing. L4-L5: Mild facet hypertrophy and small left asymmetric disc bulge. No central spinal canal or neural foraminal stenosis. L5-S1: No significant disc herniation. No spinal canal stenosis or neural foraminal narrowing. PELVIS: Distended urinary bladder. IMPRESSION: 1. No evidence of cauda equina syndrome. 2. Distended urinary bladder. 3. L4-5 mild facet hypertrophy and small left asymmetric disc bulge without central spinal canal or neural foraminal stenosis. Electronically signed by: Franky Stanford MD 09/17/2024 01:45 AM EST RP Workstation:  [id number]   DG Pelvis 1-2 Views Result Date: 09/17/2024 EXAM: 1 or 2 VIEW(S) XRAY OF THE PELVIS 09/17/2024 12:44:02 AM COMPARISON: None available. CLINICAL HISTORY: Lower extremity weakness FINDINGS: BONES AND JOINTS: Right hip bipolar hemiarthroplasty noted. Degenerative changes of the lower lumbar spine. No acute fracture. No malalignment. SOFT TISSUES: The soft tissues are unremarkable. IMPRESSION: 1. Right hip bipolar hemiarthroplasty. Electronically signed by: Dorethia Molt MD 09/17/2024 01:30 AM EST RP Workstation: [id number]   DG Chest 2 View Result Date: 09/16/2024 CLINICAL DATA:  Shortness of breath and bilateral lower extremity edema. EXAM: CHEST - 2 VIEW COMPARISON:  06/02/2023 FINDINGS: The cardiac silhouette, mediastinal and hilar contours are within normal  limits and stable. Mild tortuosity and calcification of the thoracic aorta. Low lung volumes with vascular crowding and streaky bibasilar atelectasis. There may be mild central vascular congestion but no pulmonary edema or pleural effusions. No pulmonary lesions or pneumothorax. The bony thorax is intact. Remote healed left rib fractures again noted. IMPRESSION: 1. Low lung volumes with vascular crowding and streaky bibasilar atelectasis. 2. Possible mild central vascular congestion but no pulmonary edema or pleural effusions. Electronically Signed   By: MYRTIS Stammer M.D.   On: 09/16/2024 19:44    Scheduled Meds:  benztropine  1 mg Oral BID   carbidopa -levodopa   1.5 tablet Oral BID   carbidopa -levodopa   1.5 tablet Oral QHS   Chlorhexidine Gluconate Cloth  6 each Topical Daily   furosemide   40 mg Intravenous BID   heparin   5,000 Units Subcutaneous Q8H   sodium chloride  flush  3 mL Intravenous Q12H   Vitamin B6  100 mg Oral Daily   vitamin E  400 Units Oral Daily   Continuous Infusions: PRN Meds: acetaminophen  **OR** acetaminophen , ondansetron  **OR** ondansetron  (ZOFRAN ) IV, senna-docusate  Time spent: 55 minutes  Author: ELVAN SOR. MD [facility] 09/17/2024 3:26 PM  To reach On-call, see care teams to locate the attending and reach out to them via [email or web address]. If 7PM-7AM, please contact night-coverage If you still have difficulty reaching the attending provider, please page the [facility] (Director on Call) for [facility] on amion for assistance.

## 2024-09-17 NOTE — ED Notes (Signed)
"  Pt to mri   "

## 2024-09-17 NOTE — Progress Notes (Signed)
 OT Cancellation Note  Patient Details Name: Bryan Omura MRN: [id number] DOB: 08-09-1951   Cancelled Treatment:    Reason Eval/Treat Not Completed: Medical issues which prohibited therapy;Other (comment). H pt pending US  to r/o DVT. Per protocols will hold at this time and re-attempt at a later date/time as available and pt medically appropriate for OT services.   Jhonny Pelton, M.S., OTR/L 09/17/2024, 12:41 PM

## 2024-09-17 NOTE — Progress Notes (Signed)
 PT Cancellation Note  Patient Details Name: Michele Brewer MRN: [id number] DOB: December 04, 1950   Cancelled Treatment:    Reason Eval/Treat Not Completed: Other (comment): PT eval held pending results of DVT US .  Will attempt to see pt at a future date/time as medically appropriate.    CHARM [name] PT, DPT 09/17/2024, 10:26 AM

## 2024-09-18 DIAGNOSIS — R627 Adult failure to thrive: Secondary | ICD-10-CM | POA: Diagnosis not present

## 2024-09-18 LAB — BASIC METABOLIC PANEL WITH GFR
Anion gap: 9 (ref 5–15)
BUN: 16 mg/dL (ref 8–23)
CO2: 28 mmol/L (ref 22–32)
Calcium: 9.3 mg/dL (ref 8.9–10.3)
Chloride: 101 mmol/L (ref 98–111)
Creatinine, Ser: 0.67 mg/dL (ref 0.44–1.00)
GFR, Estimated: >60 mL/min
Glucose, Bld: 82 mg/dL (ref 70–99)
Potassium: 4.2 mmol/L (ref 3.5–5.1)
Sodium: 138 mmol/L (ref 135–145)

## 2024-09-18 LAB — CBC
HCT: 29.4 % — ABNORMAL LOW (ref 36.0–46.0)
Hemoglobin: 10.8 g/dL — ABNORMAL LOW (ref 12.0–15.0)
MCH: 35.1 pg — ABNORMAL HIGH (ref 26.0–34.0)
MCHC: 36.7 g/dL — ABNORMAL HIGH (ref 30.0–36.0)
MCV: 95.5 fL (ref 80.0–100.0)
Platelets: 167 K/uL (ref 150–400)
RBC: 3.08 MIL/uL — ABNORMAL LOW (ref 3.87–5.11)
RDW: 14.4 % (ref 11.5–15.5)
WBC: 4.7 K/uL (ref 4.0–10.5)
nRBC: 0 % (ref 0.0–0.2)

## 2024-09-18 LAB — URINE CULTURE

## 2024-09-18 LAB — GLUCOSE, CAPILLARY: Glucose-Capillary: 87 mg/dL (ref 70–99)

## 2024-09-18 LAB — MAGNESIUM: Magnesium: 2 mg/dL (ref 1.7–2.4)

## 2024-09-18 LAB — PHOSPHORUS: Phosphorus: 3.2 mg/dL (ref 2.5–4.6)

## 2024-09-18 LAB — CK: Total CK: 411 U/L — ABNORMAL HIGH (ref 38–234)

## 2024-09-18 MED ORDER — IRON SUCROSE 300 MG IVPB - SIMPLE MED
300.0000 mg | Freq: Once | Status: AC
  Administered 2024-09-18: 300 mg via INTRAVENOUS
  Filled 2024-09-18: qty 300

## 2024-09-18 NOTE — Plan of Care (Signed)
  Problem: Education: Goal: Knowledge of General Education information will improve Description: Including pain rating scale, medication(s)/side effects and non-pharmacologic comfort measures Outcome: Progressing   Problem: Clinical Measurements: Goal: Respiratory complications will improve Outcome: Progressing   Problem: Activity: Goal: Risk for activity intolerance will decrease Outcome: Progressing   Problem: Nutrition: Goal: Adequate nutrition will be maintained Outcome: Progressing   Problem: Safety: Goal: Ability to remain free from injury will improve Outcome: Progressing   

## 2024-09-18 NOTE — Evaluation (Signed)
 Occupational Therapy Evaluation Patient Details Name: Michele Brewer MRN: [id number] DOB: May 25, 1951 Today's Date: 09/18/2024   History of Present Illness   Pt is a 74 y.o. year old female with medical history of Parkinson's disease, MDD presenting to the ED with worsening lower extremity edema making ambulation difficult.  MD assessment includes: FTT, LE weakness, LE edema, and UTI.     Clinical Impressions Chart reviewed to date, pt greeted semi supine in bed, alert and oriented x4, agreeable to OT evaluation. PTA Pt reports she has been having difficulties amb around her house with her RW due to BLE swelling. At baseline, she can perform ADL/IADL with MOD I per her report. Pt presents with deficits in strength,endurance, activity tolerance, balance, affecting safe and optimal ADL completion. MAX A required for bed mobility, MOD A for STS with RW, MOD A for step pivot to bsc. Significantly increased time and multi modal cueing provided for technique throughout. Pt reports she feels her legs still feel swollen. Pt is left in bedside chair, SET UP for grooming tasks, all needs met. PT is performing ADL/functional mobility below PLOF, will benefit from acute OT to address deficits and to facilitate optimal ADL/functional mobility engagement.      If plan is discharge home, recommend the following:   A lot of help with walking and/or transfers;A lot of help with bathing/dressing/bathroom     Functional Status Assessment   Patient has had a recent decline in their functional status and demonstrates the ability to make significant improvements in function in a reasonable and predictable amount of time.     Equipment Recommendations   Other (comment) (defer to next venue of care)     Recommendations for Other Services         Precautions/Restrictions   Precautions Precautions: Fall Recall of Precautions/Restrictions: Intact Restrictions Weight Bearing Restrictions Per Provider  Order: No     Mobility Bed Mobility Overal bed mobility: Needs Assistance Bed Mobility: Supine to Sit     Supine to sit: Max assist, HOB elevated, Used rails          Transfers Overall transfer level: Needs assistance Equipment used: Rolling walker (2 wheels) Transfers: Sit to/from Stand Sit to Stand: Min assist, Mod assist (regular bed height)                  Balance Overall balance assessment: Needs assistance Sitting-balance support: No upper extremity supported, Feet supported, Feet unsupported Sitting balance-Leahy Scale: Good     Standing balance support: Bilateral upper extremity supported, Reliant on assistive device for balance, During functional activity Standing balance-Leahy Scale: Fair                             ADL either performed or assessed with clinical judgement   ADL Overall ADL's : Needs assistance/impaired Eating/Feeding: Set up;Sitting   Grooming: Set up;Sitting       Lower Body Bathing: Maximal assistance;Bed level   Upper Body Dressing : Minimal assistance;Sitting   Lower Body Dressing: Maximal assistance;Sitting/lateral leans   Toilet Transfer: Moderate assistance;Rolling walker (2 wheels);Cueing for sequencing Toilet Transfer Details (indicate cue type and reason): simulated, step pivot to bedside chair, increased time Toileting- Clothing Manipulation and Hygiene: Maximal assistance Toileting - Clothing Manipulation Details (indicate cue type and reason): anticipate             Vision Patient Visual Report: No change from baseline       Perception  Praxis         Pertinent Vitals/Pain Pain Assessment Pain Assessment: Faces Faces Pain Scale: Hurts a little bit Pain Location: R hip Pain Descriptors / Indicators: Discomfort, Grimacing Pain Intervention(s): Monitored during session, Limited activity within patient's tolerance, Repositioned     Extremity/Trunk Assessment Upper Extremity  Assessment Upper Extremity Assessment: Generalized weakness   Lower Extremity Assessment Lower Extremity Assessment: Defer to PT evaluation       Communication Communication Communication: No apparent difficulties Factors Affecting Communication: Reduced clarity of speech   Cognition Arousal: Alert Behavior During Therapy: Flat affect Cognition: No apparent impairments                               Following commands: Intact, Impaired Following commands impaired: Follows multi-step commands with increased time     Cueing  General Comments   Cueing Techniques: Verbal cues;Tactile cues;Visual cues  vss, pt chux noted to be wet, pt reports she feels she is urinating around foley, notified nurse   Exercises Other Exercises Other Exercises: edu re role of OT, role of rehab, discharge recommendations   Shoulder Instructions      Home Living Family/patient expects to be discharged to:: Private residence Living Arrangements: Parent;Other relatives Available Help at Discharge: Family;Available PRN/intermittently Type of Home: House Home Access: Level entry     Home Layout: One level     Bathroom Shower/Tub: Producer, Television/film/video: Handicapped height     Home Equipment: Cane - single Librarian, Academic (2 wheels);Shower seat;Grab bars - toilet;Grab bars - tub/shower          Prior Functioning/Environment Prior Level of Function : Independent/Modified Independent;Driving;History of Falls (last six months)             Mobility Comments: MOD I with RW since R hip sx, reports recent weakness with difficulties in household amb ADLs Comments: MOD I for ADL at baseline, PRN assist for IADL but she helps her mother with her IADLs    OT Problem List: Decreased strength;Decreased activity tolerance;Decreased knowledge of use of DME or AE;Impaired balance (sitting and/or standing);Decreased coordination;Impaired UE functional use   OT  Treatment/Interventions: Self-care/ADL training;Therapeutic exercise;Energy conservation;DME and/or AE instruction;Therapeutic activities;Neuromuscular education;Modalities;Patient/family education;Balance training      OT Goals(Current goals can be found in the care plan section)   Acute Rehab OT Goals Patient Stated Goal: get stronger OT Goal Formulation: With patient Time For Goal Achievement: 10/02/24 Potential to Achieve Goals: Good ADL Goals Pt Will Perform Grooming: with modified independence;sitting Pt Will Perform Lower Body Dressing: with min assist;sitting/lateral leans Pt Will Transfer to Toilet: with contact guard assist;ambulating Pt Will Perform Toileting - Clothing Manipulation and hygiene: with contact guard assist;sitting/lateral leans   OT Frequency:  Min 2X/week    Co-evaluation              AM-PAC OT 6 Clicks Daily Activity     Outcome Measure Help from another person eating meals?: None Help from another person taking care of personal grooming?: None Help from another person toileting, which includes using toliet, bedpan, or urinal?: A Lot Help from another person bathing (including washing, rinsing, drying)?: A Lot Help from another person to put on and taking off regular upper body clothing?: A Little Help from another person to put on and taking off regular lower body clothing?: A Lot 6 Click Score: 17   End of Session Equipment Utilized During Treatment: Rolling  walker (2 wheels);Gait belt Nurse Communication: Mobility status  Activity Tolerance: Patient tolerated treatment well Patient left: in chair;with call bell/phone within reach;with chair alarm set  OT Visit Diagnosis: Other abnormalities of gait and mobility (R26.89);Muscle weakness (generalized) (M62.81)                Time: 8954-8881 OT Time Calculation (min): 33 min Charges:  OT General Charges $OT Visit: 1 Visit OT Evaluation $OT Eval Moderate Complexity: 1 Mod  Therisa Sheffield,  OTD OTR/L  09/18/2024, 12:56 PM

## 2024-09-18 NOTE — Plan of Care (Signed)

## 2024-09-18 NOTE — Progress Notes (Signed)
 [facility] Progress Note  Patient: Michele Brewer    FMW:[id number]  DOA: 09/16/2024     Date of Service: the patient was seen and examined on 09/18/2024  Chief Complaint  Patient presents with   Leg Swelling   Brief hospital course:  Michele Brewer is a 74 y.o. year old female with medical history of Parkinson's disease, MDD presenting to the ED with worsening lower extremity edema making ambulation difficult. States it has been worsening for the last 6 weeks. She states it was abrupt weakness in her legs. States swelling and weakness occurred together. She was able to do her ADLs and iADLs prior to this but has been unable to to them.   ED workup: HDS stable.  Labs: CBC without leukocytosis, mild anemia at baseline.  BMP unremarkable. UA with signs of infection but no symptoms.  Urine culture pending. ProBNP elevated at 856.  CXR with low lung volumes but otherwise clear. Given pt's edema making it difficult for patient to ambulate,  [facility] contacted for admission.    Assessment and Plan:  # Parkinson disease, presented with lower extremity stiffness and difficulty ambulation Continue Sinemet  Started Cogentin 1 mg p.o. twice daily Continue fall precaution PT and OT eval done, recommend SNF placement  Elevated CK level CK [id number] It could be secondary to immobility. Cannot give IV fluid because of lower extremity edema, patient is getting Lasix  for diuresis Check CK level daily   # Lower extremity edema could be HFpEF proBNP elevated TTE shows LVEF 60 to 65% Started Lasix  40 mg IV twice daily Monitor urine output and renal function stat Venous duplex negative for DVT   Possible UTI UA positive Started ceftriaxone  1 g IV daily Follow urine culture   # Urinary retention Bladder scan 286, Foley catheter was inserted and 325 mL urine was collected on 12/31 Follow voiding trial after few days  # Vitamin E and vitamin B6 deficiency Started vitamin E and vitamin B6  oral supplement B12 and folate within normal range  # Hypokalemia, potassium repleted. Monitor electrolytes and replete as needed.   Body mass index is 25.92 kg/m (pended).  Interventions:  Diet: Regular diet, fluid restriction 1.5 L/day DVT Prophylaxis: Subcutaneous Heparin     Advance goals of care discussion: DNR-intermittent  Family Communication: family was not present at bedside, at the time of interview.  The pt provided permission to discuss medical plan with the family. Opportunity was given to ask question and all questions were answered satisfactorily.   Disposition:  Pt is from home, admitted with lower extremity weakness due to Parkinson, still has weakness, which precludes a safe discharge. Discharge to SNF, when stable, most likely 1 to 2 days.  Subjective: No significant events overnight, patient felt improvement in the lower extremity edema, still has significant weakness in the bilateral extremity, feels maybe a little bit improvement after Cogentin but did not appreciate much improvement.   Physical Exam: General: NAD, lying comfortably Appear in no distress, affect appropriate Eyes: PERRLA ENT: Oral Mucosa Clear, moist  Neck: no JVD,  Cardiovascular: S1 and S2 Present, no Murmur,  Respiratory: good respiratory effort, Bilateral Air entry equal and Decreased, no Crackles, no wheezes Abdomen: Bowel Sound present, Soft and no tenderness,  Skin: no rashes Extremities: 2-3+ pedal edema, no calf tenderness Neurologic: Bilateral lower extremity stiffness most likely due to Parkinson disease, no any other focal deficits.   Gait not checked due to patient safety concerns  Vitals:   09/18/24 0500 09/18/24 0508 09/18/24  0736 09/18/24 1636  BP:  (!) 124/54 (!) 117/51 103/61  Pulse:  70 71 65  Resp:  18 17 16   Temp:  99 F (37.2 C) 98 F (36.7 C) 98.5 F (36.9 C)  TempSrc:  Oral    SpO2:  99% 98% 100%  Weight: 68.5 kg     Height:        Intake/Output  Summary (Last 24 hours) at 09/18/2024 1651 Last data filed at 09/18/2024 1500 Gross per 24 hour  Intake 368.5 ml  Output 3850 ml  Net -3481.5 ml   Filed Weights   09/16/24 1754 09/17/24 0200 09/18/24 0500  Weight: 66.7 kg (P) 66.4 kg 68.5 kg    Data Reviewed: I have personally reviewed and interpreted daily labs, tele strips, imagings as discussed above. I reviewed all nursing notes, pharmacy notes, vitals, pertinent old records I have discussed plan of care as described above with RN and patient/family.  CBC: Recent Labs  Lab 09/16/24 1758 09/17/24 0429 09/18/24 0528  WBC 5.1 4.3 4.7  HGB 11.0* 10.3* 10.8*  HCT 31.8* 28.6* 29.4*  MCV 94.4 94.1 95.5  PLT 173 173 167   Basic Metabolic Panel: Recent Labs  Lab 09/16/24 1758 09/16/24 1953 09/17/24 0428 09/17/24 0429 09/18/24 0528  NA 139  --   --  141 138  K 3.9  --   --  3.3* 4.2  CL 104  --   --  104 101  CO2 25  --   --  28 28  GLUCOSE 71  --   --  106* 82  BUN 17  --   --  18 16  CREATININE 0.66  --   --  0.80 0.67  CALCIUM 9.3  --   --  9.3 9.3  MG  --  2.0  --   --  2.0  PHOS  --   --  3.7  --  3.2    Studies: No results found.   Scheduled Meds:  benztropine  1 mg Oral BID   carbidopa -levodopa   1.5 tablet Oral BID   Chlorhexidine Gluconate Cloth  6 each Topical Daily   furosemide   40 mg Intravenous BID   heparin   5,000 Units Subcutaneous Q8H   pyridOXINE   100 mg Oral Daily   sodium chloride  flush  3 mL Intravenous Q12H   vitamin E  400 Units Oral Daily   Continuous Infusions:  cefTRIAXone  (ROCEPHIN )  IV Stopped (09/17/24 1726)   PRN Meds: acetaminophen  **OR** acetaminophen , ondansetron  **OR** ondansetron  (ZOFRAN ) IV, senna-docusate  Time spent: 55 minutes  Author: ELVAN SOR. MD [facility] 09/18/2024 4:51 PM  To reach On-call, see care teams to locate the attending and reach out to them via [email or web address]. If 7PM-7AM, please contact night-coverage If you still have difficulty reaching  the attending provider, please page the [facility] (Director on Call) for [facility] on amion for assistance.

## 2024-09-19 ENCOUNTER — Inpatient Hospital Stay

## 2024-09-19 DIAGNOSIS — R600 Localized edema: Secondary | ICD-10-CM | POA: Diagnosis not present

## 2024-09-19 DIAGNOSIS — R627 Adult failure to thrive: Secondary | ICD-10-CM | POA: Diagnosis not present

## 2024-09-19 DIAGNOSIS — G6289 Other specified polyneuropathies: Secondary | ICD-10-CM | POA: Diagnosis not present

## 2024-09-19 DIAGNOSIS — Z9889 Other specified postprocedural states: Secondary | ICD-10-CM | POA: Diagnosis not present

## 2024-09-19 DIAGNOSIS — G959 Disease of spinal cord, unspecified: Secondary | ICD-10-CM

## 2024-09-19 DIAGNOSIS — G20A1 Parkinson's disease without dyskinesia, without mention of fluctuations: Secondary | ICD-10-CM | POA: Diagnosis not present

## 2024-09-19 LAB — CBC
HCT: 36.9 % (ref 36.0–46.0)
Hemoglobin: 12.2 g/dL (ref 12.0–15.0)
MCH: 29.6 pg (ref 26.0–34.0)
MCHC: 33.1 g/dL (ref 30.0–36.0)
MCV: 89.6 fL (ref 80.0–100.0)
Platelets: 178 K/uL (ref 150–400)
RBC: 4.12 MIL/uL (ref 3.87–5.11)
RDW: 14.2 % (ref 11.5–15.5)
WBC: 4.2 K/uL (ref 4.0–10.5)
nRBC: 0 % (ref 0.0–0.2)

## 2024-09-19 LAB — BASIC METABOLIC PANEL WITH GFR
Anion gap: 11 (ref 5–15)
BUN: 19 mg/dL (ref 8–23)
CO2: 32 mmol/L (ref 22–32)
Calcium: 10 mg/dL (ref 8.9–10.3)
Chloride: 98 mmol/L (ref 98–111)
Creatinine, Ser: 0.77 mg/dL (ref 0.44–1.00)
GFR, Estimated: >60 mL/min
Glucose, Bld: 83 mg/dL (ref 70–99)
Potassium: 3.7 mmol/L (ref 3.5–5.1)
Sodium: 140 mmol/L (ref 135–145)

## 2024-09-19 LAB — METHYLMALONIC ACID, SERUM: Methylmalonic Acid, Quantitative: 131 nmol/L (ref 0–378)

## 2024-09-19 MED ORDER — CARBIDOPA-LEVODOPA 25-100 MG PO TABS
1.5000 | ORAL_TABLET | Freq: Three times a day (TID) | ORAL | Status: DC
  Administered 2024-09-19 – 2024-09-25 (×18): 1.5 via ORAL
  Filled 2024-09-19 (×19): qty 2

## 2024-09-19 MED ORDER — FUROSEMIDE 10 MG/ML IJ SOLN
40.0000 mg | Freq: Every day | INTRAMUSCULAR | Status: AC
  Administered 2024-09-19 – 2024-09-20 (×2): 40 mg via INTRAVENOUS
  Filled 2024-09-19 (×2): qty 4

## 2024-09-19 MED ORDER — SODIUM CHLORIDE 0.9 % IV SOLN
1.0000 g | INTRAVENOUS | Status: AC
  Administered 2024-09-19: 1 g via INTRAVENOUS
  Filled 2024-09-19: qty 10

## 2024-09-19 NOTE — Progress Notes (Signed)
 [facility] Progress Note  Patient: Michele Brewer    FMW:[id number]  DOA: 09/16/2024     Date of Service: the patient was seen and examined on 09/19/2024  Chief Complaint  Patient presents with   Leg Swelling   Brief hospital course:  Bhavya Eschete is a 74 y.o. year old female with medical history of Parkinson's disease, MDD presenting to the ED with worsening lower extremity edema making ambulation difficult. States it has been worsening for the last 6 weeks. She states it was abrupt weakness in her legs. States swelling and weakness occurred together. She was able to do her ADLs and iADLs prior to this but has been unable to to them.   ED workup: HDS stable.  Labs: CBC without leukocytosis, mild anemia at baseline.  BMP unremarkable. UA with signs of infection but no symptoms.  Urine culture pending. ProBNP elevated at 856.  CXR with low lung volumes but otherwise clear. Given pt's edema making it difficult for patient to ambulate,  [facility] contacted for admission.    Assessment and Plan:  # Parkinson disease, presented with lower extremity stiffness and difficulty ambulation Continue Sinemet  Started Cogentin 1 mg p.o. twice daily Continue fall precaution PT and OT eval done, recommend SNF placement 1/2 neurology consulted for further recommendation  Elevated CK level CK 699>>411 It could be secondary to immobility. Cannot give IV fluid because of lower extremity edema, patient is getting Lasix  for diuresis Check CK level daily   # Lower extremity edema could be HFpEF proBNP elevated TTE shows LVEF 60 to 65% S/p Lasix  40 mg IV twice daily x 2 days, followed by 40 mg IV daily x 2 days Monitor urine output and renal function stat Venous duplex negative for DVT   Possible UTI UA positive Started ceftriaxone  1 g IV daily x 3 days urine culture growing multiple species, suggested reflex   # Urinary retention Bladder scan 286, Foley catheter was inserted and 325 mL  urine was collected on 12/31 1/2 discontinued Foley, follow voiding trial    # Vitamin E and vitamin B6 deficiency Started vitamin E and vitamin B6 oral supplement B12 and folate within normal range  # Hypokalemia, potassium repleted. Monitor electrolytes and replete as needed.   Body mass index is 24.9 kg/m (pended).  Interventions:  Diet: Regular diet, fluid restriction 1.5 L/day DVT Prophylaxis: Subcutaneous Heparin     Advance goals of care discussion: DNR-intermittent  Family Communication: family was not present at bedside, at the time of interview.  The pt provided permission to discuss medical plan with the family. Opportunity was given to ask question and all questions were answered satisfactorily.   Disposition:  Pt is from home, admitted with lower extremity weakness due to Parkinson, still has weakness, which precludes a safe discharge. Discharge to SNF, when stable, most likely 1 to 2 days.  Subjective: No significant events overnight, patient feels improvement in the lower extremity edema, does not feel any improvement in the weakness, still having difficulty ambulation.    Physical Exam: General: NAD, lying comfortably Appear in no distress, affect appropriate Eyes: PERRLA ENT: Oral Mucosa Clear, moist  Neck: no JVD,  Cardiovascular: S1 and S2 Present, no Murmur,  Respiratory: good respiratory effort, Bilateral Air entry equal and Decreased, no Crackles, no wheezes Abdomen: Bowel Sound present, Soft and no tenderness,  Skin: no rashes Extremities: 1+ pedal edema, improved after diuresis Neurologic: Bilateral lower extremity stiffness most likely due to Parkinson disease, no any other focal deficits.  Gait not checked due to patient safety concerns  Vitals:   09/18/24 2007 09/19/24 0453 09/19/24 0500 09/19/24 0746  BP: 114/62 (!) 99/57  (!) 106/54  Pulse: 82 70  71  Resp: 20 20  16   Temp: 98.4 F (36.9 C) 98.9 F (37.2 C)  98.2 F (36.8 C)   TempSrc: Oral Axillary  Oral  SpO2: 98% 99%  98%  Weight:   65.8 kg   Height:        Intake/Output Summary (Last 24 hours) at 09/19/2024 1542 Last data filed at 09/19/2024 0900 Gross per 24 hour  Intake 334.6 ml  Output 1600 ml  Net -1265.4 ml   Filed Weights   09/17/24 0200 09/18/24 0500 09/19/24 0500  Weight: (P) 66.4 kg 68.5 kg 65.8 kg    Data Reviewed: I have personally reviewed and interpreted daily labs, tele strips, imagings as discussed above. I reviewed all nursing notes, pharmacy notes, vitals, pertinent old records I have discussed plan of care as described above with RN and patient/family.  CBC: Recent Labs  Lab 09/16/24 1758 09/17/24 0429 09/18/24 0528 09/19/24 1031  WBC 5.1 4.3 4.7 4.2  HGB 11.0* 10.3* 10.8* 12.2  HCT 31.8* 28.6* 29.4* 36.9  MCV 94.4 94.1 95.5 89.6  PLT 173 173 167 178   Basic Metabolic Panel: Recent Labs  Lab 09/16/24 1758 09/16/24 1953 09/17/24 0428 09/17/24 0429 09/18/24 0528 09/19/24 1031  NA 139  --   --  141 138 140  K 3.9  --   --  3.3* 4.2 3.7  CL 104  --   --  104 101 98  CO2 25  --   --  28 28 32  GLUCOSE 71  --   --  106* 82 83  BUN 17  --   --  18 16 19   CREATININE 0.66  --   --  0.80 0.67 0.77  CALCIUM 9.3  --   --  9.3 9.3 10.0  MG  --  2.0  --   --  2.0  --   PHOS  --   --  3.7  --  3.2  --     Studies: No results found.   Scheduled Meds:  carbidopa -levodopa   1.5 tablet Oral TID   Chlorhexidine Gluconate Cloth  6 each Topical Daily   heparin   5,000 Units Subcutaneous Q8H   pyridOXINE   100 mg Oral Daily   sodium chloride  flush  3 mL Intravenous Q12H   vitamin E  400 Units Oral Daily   Continuous Infusions:  cefTRIAXone  (ROCEPHIN )  IV Stopped (09/18/24 1745)   PRN Meds: acetaminophen  **OR** acetaminophen , ondansetron  **OR** ondansetron  (ZOFRAN ) IV, senna-docusate  Time spent: 40 minutes  Author: ELVAN SOR. MD [facility] 09/19/2024 3:42 PM  To reach On-call, see care teams to locate the  attending and reach out to them via [email or web address]. If 7PM-7AM, please contact night-coverage If you still have difficulty reaching the attending provider, please page the [facility] (Director on Call) for [facility] on amion for assistance.

## 2024-09-19 NOTE — [place] FL2 (Signed)
 "   MEDICAID [id number] LEVEL OF CARE FORM     IDENTIFICATION  Patient Name: Michele Brewer Birthdate: August 02, 1951 Sex: female Admission Date (Current Location): 09/16/2024  [name] and [name] Number:  Chiropodist and Address:  [facility], [address] [place], [place] [address]      Provider Number: [id number]  Attending Physician Name and Address:  Von Bellis, MD  Relative Name and Phone Number:  Michele Brewer- [phone]    Current Level of Care: Hospital Recommended Level of Care: Skilled Nursing Facility Prior Approval Number:    Date Approved/Denied:   PASRR Number: [id number]  Discharge Plan: SNF    Current Diagnoses: Patient Active Problem List   Diagnosis Date Noted   Failure to thrive in adult 09/16/2024   Hypotension 08/24/2024   Lower extremity edema 08/22/2024   Weakness 08/22/2024   Malnutrition of moderate degree 08/19/2024   UTI (urinary tract infection) 08/19/2024   Chronic anemia 08/19/2024   Syncope 08/18/2024   Rupture of ligament of right wrist 06/18/2024   Closed right hip fracture (HCC) 06/17/2024   COVID-19 virus infection 06/02/2023   Constipation 07/18/2022   Vitamin B12 deficiency 07/16/2022   Vitamin D  deficiency 07/16/2022   Folate deficiency 07/16/2022   Lower extremity weakness 07/15/2022   Neuropathy 07/15/2022   Ambulatory dysfunction 07/14/2022   Hypomagnesemia 05/28/2022   Parkinson disease (HCC) 05/25/2022   Depression 05/25/2022   Frequent falls 02/09/2021   Multiple fractures of ribs, left side, subsequent encounter for fracture with routine healing 02/08/2021   Polyneuropathy, unspecified 02/08/2021   Rhabdomyolysis 02/04/2021   Fall 02/03/2021   Osteopenia 04/07/2020   Major depressive disorder, recurrent, 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    Orientation RESPIRATION BLADDER Height & Weight     Self, Time, Situation, Place  Normal Indwelling catheter Weight: 65.8  kg Height:  (P) 5' 4 (162.6 cm)  BEHAVIORAL SYMPTOMS/MOOD NEUROLOGICAL BOWEL NUTRITION STATUS      Continent Diet (Fluid restriction 1500 ML)  AMBULATORY STATUS COMMUNICATION OF NEEDS Skin   Extensive Assist Verbally Normal                       Personal Care Assistance Level of Assistance  Bathing, Feeding, Dressing Bathing Assistance: Maximum assistance Feeding assistance: Limited assistance Dressing Assistance: Maximum assistance     Functional Limitations Info             SPECIAL CARE FACTORS FREQUENCY  PT (By licensed PT), OT (By licensed OT)     PT Frequency: 5 days per week OT Frequency: 5 days per week            Contractures Contractures Info: Not present    Additional Factors Info  Code Status, Allergies Code Status Info: DNR Allergies Info: Citrus Medium   rash  Peanut-containing Drug Products Medium   Rash and itch           Current Medications (09/19/2024):  This is the current hospital active medication list Current Facility-Administered Medications  Medication Dose Route Frequency Provider Last Rate Last Admin   acetaminophen  (TYLENOL ) tablet 650 mg  650 mg Oral Q6H PRN Khan, Ghalib, MD   650 mg at 09/17/24 0013   Or   acetaminophen  (TYLENOL ) suppository 650 mg  650 mg Rectal Q6H PRN Khan, Ghalib, MD       benztropine (COGENTIN) tablet 1 mg  1 mg Oral BID Von Bellis, MD   1 mg at 09/19/24 0820   carbidopa -levodopa  (SINEMET   IR) 25-100 MG per tablet immediate release 1.5 tablet  1.5 tablet Oral BID Von Bellis, MD   1.5 tablet at 09/19/24 0818   cefTRIAXone  (ROCEPHIN ) 1 g in sodium chloride  0.9 % 100 mL IVPB  1 g Intravenous Q24H Von Bellis, MD   Stopped at 09/18/24 1745   Chlorhexidine Gluconate Cloth 2 % PADS 6 each  6 each Topical Daily Von Bellis, MD   6 each at 09/18/24 1148   heparin  injection 5,000 Units  5,000 Units Subcutaneous Q8H Khan, Ghalib, MD   5,000 Units at 09/19/24 0645   ondansetron  (ZOFRAN ) tablet 4 mg  4 mg Oral  Q6H PRN Fernand Prost, MD       Or   ondansetron  (ZOFRAN ) injection 4 mg  4 mg Intravenous Q6H PRN Khan, Ghalib, MD   4 mg at 09/17/24 1711   pyridOXINE  (VITAMIN B6) tablet 100 mg  100 mg Oral Daily Von Bellis, MD   100 mg at 09/19/24 0818   senna-docusate (Senokot-S) tablet 1 tablet  1 tablet Oral QHS PRN Fernand Prost, MD       sodium chloride  flush (NS) 0.9 % injection 3 mL  3 mL Intravenous Q12H Khan, Ghalib, MD   3 mL at 09/19/24 0820   vitamin E capsule 400 Units  400 Units Oral Daily Von Bellis, MD   400 Units at 09/19/24 9182     Discharge Medications: Please see discharge summary for a list of discharge medications.  Relevant Imaging Results:  Relevant Lab Results:   Additional Information SSN: [id number]  Michele CHRISTELLA Ring, RN     "

## 2024-09-19 NOTE — Progress Notes (Signed)
 Occupational Therapy Treatment Patient Details Name: Michele Brewer MRN: [id number] DOB: September 17, 1951 Today's Date: 09/19/2024   History of present illness Pt is a 74 y.o. year old female with medical history of Parkinson's disease, MDD presenting to the ED with worsening lower extremity edema making ambulation difficult.  MD assessment includes: FTT, LE weakness, LE edema, and UTI.   OT comments  Pt is supine in bed on arrival. Pleasant and agreeable to OT session. She reports mild discomfort to R hip when moving. Pt performed bed mobility with Mod A for BLE management and use of bed features with increased time/effort. Stood from slightly elevated bed height with Min A and cues for hand placement then able to SPT to recliner with Min/mod A and use of RW with increased time and unsteadiness. ADLs performed seated in recliner/standing at [facility]. Set up to supervision for UB ADL management and Mod A for LB ADL management required with constant +1 assist to maintain dynamic standing balance. Pt left in recliner with all needs in place and will cont to require skilled acute OT services to maximize her safety and IND to return to PLOF.       If plan is discharge home, recommend the following:  A lot of help with walking and/or transfers;A lot of help with bathing/dressing/bathroom   Equipment Recommendations  Other (comment) (defer)    Recommendations for Other Services      Precautions / Restrictions Precautions Precautions: Fall Recall of Precautions/Restrictions: Intact Restrictions Weight Bearing Restrictions Per Provider Order: No       Mobility Bed Mobility Overal bed mobility: Needs Assistance Bed Mobility: Supine to Sit     Supine to sit: HOB elevated, Used rails, Mod assist     General bed mobility comments: increased time and assist with BLE management with HOB elevatedand cues for technique    Transfers Overall transfer level: Needs assistance Equipment used: Rolling walker  (2 wheels) Transfers: Sit to/from Stand, Bed to chair/wheelchair/BSC Sit to Stand: Min assist, From elevated surface     Step pivot transfers: Min assist, Mod assist     General transfer comment: stood from slightly elevated bed height with cues for hand placement, able to step pivot to recliner using RW with increased time and cues for overall sequencing     Balance Overall balance assessment: Needs assistance Sitting-balance support: No upper extremity supported, Feet supported, Feet unsupported Sitting balance-Leahy Scale: Good     Standing balance support: Bilateral upper extremity supported, Reliant on assistive device for balance, During functional activity Standing balance-Leahy Scale: Fair Standing balance comment: RW and +1 assist to maintain dynamic standing balance                           ADL either performed or assessed with clinical judgement   ADL Overall ADL's : Needs assistance/impaired     Grooming: Set up;Sitting;Wash/dry face;Oral care;Applying deodorant Grooming Details (indicate cue type and reason): sitting in recliner Upper Body Bathing: Supervision/ safety;Sitting   Lower Body Bathing: Sit to/from stand;Sitting/lateral leans;Moderate assistance Lower Body Bathing Details (indicate cue type and reason): assist to bathe bil feet and buttocks, able to perform remainder while seated in recliner and anterior hygiene in standing with Min A to maintain standing balance Upper Body Dressing : Minimal assistance;Sitting Upper Body Dressing Details (indicate cue type and reason): change out gowns Lower Body Dressing: Maximal assistance;Sitting/lateral leans Lower Body Dressing Details (indicate cue type and reason): doff socks Toilet  Transfer: Moderate assistance;Rolling walker (2 wheels);Cueing for sequencing;Minimal assistance Toilet Transfer Details (indicate cue type and reason): simulated, step pivot to bedside chair, increased time and cues                 Extremity/Trunk Assessment              Vision       Perception     Praxis     Communication Communication Communication: No apparent difficulties Factors Affecting Communication: Reduced clarity of speech   Cognition Arousal: Alert Behavior During Therapy: Flat affect, WFL for tasks assessed/performed Cognition: No apparent impairments                               Following commands: Intact, Impaired Following commands impaired: Follows multi-step commands with increased time      Cueing   Cueing Techniques: Verbal cues, Tactile cues, Visual cues  Exercises      Shoulder Instructions       General Comments      Pertinent Vitals/ Pain       Pain Assessment Pain Assessment: Faces Faces Pain Scale: Hurts little more Pain Location: R hip Pain Descriptors / Indicators: Discomfort, Grimacing Pain Intervention(s): Monitored during session, Limited activity within patient's tolerance, Repositioned  Home Living                                          Prior Functioning/Environment              Frequency  Min 2X/week        Progress Toward Goals  OT Goals(current goals can now be found in the care plan section)  Progress towards OT goals: Progressing toward goals  Acute Rehab OT Goals Patient Stated Goal: get stronger OT Goal Formulation: With patient Time For Goal Achievement: 10/02/24 Potential to Achieve Goals: Good  Plan      Co-evaluation                 AM-PAC OT 6 Clicks Daily Activity     Outcome Measure   Help from another person eating meals?: None Help from another person taking care of personal grooming?: A Little Help from another person toileting, which includes using toliet, bedpan, or urinal?: A Lot Help from another person bathing (including washing, rinsing, drying)?: A Lot Help from another person to put on and taking off regular upper body clothing?: A Little Help  from another person to put on and taking off regular lower body clothing?: A Lot 6 Click Score: 16    End of Session Equipment Utilized During Treatment: Rolling walker (2 wheels);Gait belt  OT Visit Diagnosis: Other abnormalities of gait and mobility (R26.89);Muscle weakness (generalized) (M62.81)   Activity Tolerance Patient tolerated treatment well   Patient Left in chair;with call bell/phone within reach;with chair alarm set   Nurse Communication Mobility status        Time: 8941-8866 OT Time Calculation (min): 35 min  Charges: OT General Charges $OT Visit: 1 Visit OT Treatments $Self Care/Home Management : 23-37 mins  Cricket Goodlin Chrismon, OTR/L  09/19/2024, 12:52 PM   Maryanna Stuber E Chrismon 09/19/2024, 12:50 PM

## 2024-09-19 NOTE — Plan of Care (Signed)

## 2024-09-19 NOTE — TOC Progression Note (Signed)
 Transition of Care [facility]) - Progression Note    Patient Details  Name: Estrellita Lasky MRN: [id number] Date of Birth: 10-Jun-1951  Transition of Care [facility]) CM/SW Contact  Nathanael CHRISTELLA Ring, RN Phone Number: 09/19/2024, 5:15 PM  Clinical Narrative:     CM presented bed offers, she wants to look them over and talk to her family.  TOC to follow up tomorrow.   Expected Discharge Plan: Skilled Nursing Facility Barriers to Discharge: Continued Medical Work up               Expected Discharge Plan and Services   Discharge Planning Services: CM Consult Post Acute Care Choice: Skilled Nursing Facility Living arrangements for the past 2 months: Single Family Home                 DME Arranged: N/A         HH Arranged: NA           Social Drivers of Health (SDOH) Interventions SDOH Screenings   Food Insecurity: Unknown (09/17/2024)  Housing: Low Risk (09/17/2024)  Transportation Needs: No Transportation Needs (09/17/2024)  Utilities: Not At Risk (09/17/2024)  Financial Resource Strain: Low Risk  (06/12/2024)   Received from [facility]  Social Connections: Moderately Integrated (09/17/2024)  Recent Concern: Social Connections - Moderately Isolated (08/18/2024)  Tobacco Use: Low Risk (09/16/2024)    Readmission Risk Interventions     No data to display

## 2024-09-19 NOTE — Plan of Care (Signed)
"    Patient was confused periodically, attempted to get out of bed however was redirectable. Patient resting in bed. Will continue to monitor.  Problem: Clinical Measurements: Goal: Ability to maintain clinical measurements within normal limits will improve Outcome: Progressing Goal: Will remain free from infection Outcome: Progressing Goal: Diagnostic test results will improve Outcome: Progressing Goal: Respiratory complications will improve Outcome: Progressing Goal: Cardiovascular complication will be avoided Outcome: Progressing   Problem: Safety: Goal: Ability to remain free from injury will improve Outcome: Progressing   "

## 2024-09-19 NOTE — Consult Note (Signed)
 NEUROLOGY CONSULT NOTE   Date of service: September 19, 2024 Patient Name: Michele Brewer MRN:  [id number] DOB:  June 27, 1951 Chief Complaint: Leg swelling Requesting Provider: Von Bellis, MD  History of Present Illness  Michele Brewer is a 74 y.o. female with hx of Parkinson's disease who currently takes 1.5 tablets 3 times daily at 6 AM, early afternoon, and right before bed.  She states that she woke up one morning several weeks ago with a sudden change in her ability to ambulate.  It was at that time that she noticed that she had lower extremity swelling, which she had not ever noticed before.  She states that she does not think it was there before, but never really looked at her legs before she noticed that she was having trouble walking.  She denies any numbness, just difficulty with her gait.  She does have Parkinson's disease, and states that she has been told to increase her dosing to 2 tablets 3 times daily, but I am not sure who told her this.  Past History   Past Medical History:  Diagnosis Date   Depression    Elevated CK 06/03/2023   Parkinson disease [facility])     Past Surgical History:  Procedure Laterality Date   CESAREAN SECTION     HIP ARTHROPLASTY Right 06/17/2024   Procedure: HEMIARTHROPLASTY (BIPOLAR) HIP, POSTERIOR APPROACH FOR FRACTURE;  Surgeon: Edie Norleen PARAS, MD;  Location: [facility] ORS;  Service: Orthopedics;  Laterality: Right;    Family History: Family History  Problem Relation Age of Onset   Gout Mother    Heart disease Father     Social History  reports that she has never smoked. She has never used smokeless tobacco. She reports that she does not currently use alcohol . She reports that she does not use drugs.  Allergies[1]  Medications  Current Medications[2]  Vitals   Vitals:   09/18/24 2007 09/19/24 0453 09/19/24 0500 09/19/24 0746  BP: 114/62 (!) 99/57  (!) 106/54  Pulse: 82 70  71  Resp: 20 20  16   Temp: 98.4 F (36.9 C) 98.9 F (37.2 C)   98.2 F (36.8 C)  TempSrc: Oral Axillary  Oral  SpO2: 98% 99%  98%  Weight:   65.8 kg   Height:        Body mass index is 24.9 kg/m (pended).   Physical Exam   Constitutional: Appears well-developed and well-nourished.   Neurologic Examination    Neuro: Mental Status: Patient is awake, alert, oriented to person, place, month, year, and situation. Patient is able to give a clear and coherent history. No signs of aphasia or neglect Cranial Nerves: II: Visual Fields are full. Pupils are equal, round, and reactive to light.   III,IV, VI: EOMI without ptosis or diploplia.  V: Facial sensation is symmetric to temperature VII: Facial movement with possible slight decrease on the right VIII: hearing is intact to voice X: Uvula elevates symmetrically XII: tongue is midline without atrophy or fasciculations.  Motor: She has mild drift in bilateral upper extremities, but symmetric to confrontation.  She guards against any type of movement in her lower extremities, but when I hold her needle off, she then is able to hold her hip aloft against gravity with only mild drift bilaterally.  It does feel to me that she has some spasticity and she holds her legs scissored together, but she states that she does this because of pain due to her hip replacement. Sensory: Sensation is symmetric to  light touch and temperature in the arms and legs. Deep Tendon Reflexes: She guards against movement in her knees and reacts before I tap her making checking her knee reflexes very difficult, absent at the ankles.   Plantars: Toes are downgoing bilaterally.  Cerebellar: FNF intact bilaterally  She has a parkinsonian tremor of the left upper extremity      Labs/Imaging/Neurodiagnostic studies   CBC:  Recent Labs  Lab 10/08/2024 0528 09/19/24 1031  WBC 4.7 4.2  HGB 10.8* 12.2  HCT 29.4* 36.9  MCV 95.5 89.6  PLT 167 178   Basic Metabolic Panel:  Lab Results  Component Value Date   NA 140  09/19/2024   K 3.7 09/19/2024   CO2 32 09/19/2024   GLUCOSE 83 09/19/2024   BUN 19 09/19/2024   CREATININE 0.77 09/19/2024   CALCIUM 10.0 09/19/2024   GFRNONAA >60 09/19/2024   MRI L-spine-no explanation for symptoms  ASSESSMENT   Michele Brewer is a 74 y.o. female with gait dysfunction of unclear etiology.  The fact that she noticed so much swelling, does make me think that this may be more of a mechanical as opposed to neurological issue.  If she had difficulties with her gait prior to developing her edema, this could have been exacerbated by the edema.  More concerning, is a possibility that she does have spasticity, she guards against any type of movement because of pain of her lower extremities and she seems to have pain of all of her joints, which does make examination more difficult, but I have enough concern that I do think spinal imaging would be prudent.  If her MRI C and T-spine are negative, then I would favor this being a multifactorial gait disturbance.  She has had recent workup with serum copper , vitamin E, B6, TSH, manganese, B12, with B6 and vitamin EE being low.  She is currently on vitamin supplementation for B6 and vitamin E.  Vitamin E has been associated with myelopathy and therefore could be associated, vitamin D6 is more associated with sensory neuropathy, and therefore though it could be one of multiple factors I do not think it is the primary cause.  Also her current Sinemet  dosing is suboptimal given that she takes it right before bedtime and therefore would be getting its benefits while asleep.  I discussed with her to take it during the day.  RECOMMENDATIONS  MRI C and T-spine Changed Sinemet  dosing to 1.5 tabs 3 times daily at 7 AM, noon, 5 PM ______________________________________________________________________    Signed, Aisha Seals, MD [facility]    [1]  Allergies Allergen Reactions   Citrus     rash   Peanut-Containing Drug  Products     Rash and itch  [2]  Current Facility-Administered Medications:    acetaminophen  (TYLENOL ) tablet 650 mg, 650 mg, Oral, Q6H PRN, 650 mg at 09/17/24 0013 **OR** acetaminophen  (TYLENOL ) suppository 650 mg, 650 mg, Rectal, Q6H PRN, Fernand Prost, MD   carbidopa -levodopa  (SINEMET  IR) 25-100 MG per tablet immediate release 1.5 tablet, 1.5 tablet, Oral, TID, Seals Aisha SQUIBB, MD   cefTRIAXone  (ROCEPHIN ) 1 g in sodium chloride  0.9 % 100 mL IVPB, 1 g, Intravenous, Q24H, Von Bellis, MD, Stopped at 10/08/2024 1745   Chlorhexidine Gluconate Cloth 2 % PADS 6 each, 6 each, Topical, Daily, Von Bellis, MD, 6 each at 09/19/24 1359   heparin  injection 5,000 Units, 5,000 Units, Subcutaneous, Q8H, Fernand Prost, MD, 5,000 Units at 09/19/24 1358   ondansetron  (ZOFRAN ) tablet 4 mg, 4  mg, Oral, Q6H PRN **OR** ondansetron  (ZOFRAN ) injection 4 mg, 4 mg, Intravenous, Q6H PRN, Fernand Prost, MD, 4 mg at 09/17/24 1711   pyridOXINE  (VITAMIN B6) tablet 100 mg, 100 mg, Oral, Daily, Von Bellis, MD, 100 mg at 09/19/24 0818   senna-docusate (Senokot-S) tablet 1 tablet, 1 tablet, Oral, QHS PRN, Fernand Prost, MD   sodium chloride  flush (NS) 0.9 % injection 3 mL, 3 mL, Intravenous, Q12H, Fernand Prost, MD, 3 mL at 09/19/24 0820   vitamin E capsule 400 Units, 400 Units, Oral, Daily, Von Bellis, MD, 400 Units at 09/19/24 [phone]

## 2024-09-19 NOTE — TOC Initial Note (Signed)
 Transition of Care [facility]) - Initial/Assessment Note    Patient Details  Name: Michele Brewer MRN: [id number] Date of Birth: 06/13/1951  Transition of Care [facility]) CM/SW Contact:    Nathanael CHRISTELLA Ring, RN Phone Number: 09/19/2024, 9:02 AM  Clinical Narrative:                 CM met with patient at the bedside, she is sitting up in the bedside chair with her eyes closed, she says she is trying to nap but heard a loud commotion just a little while ago.  Introduced self and explained role in DC planning.  She reports that she is from home where she lives with her sister, has a walker and was pretty independent before coming to the hospital and was driving.  She agrees to go for STR, she reports that she has been to [facility] in the past. CM will start SNF workup and bed search.   Expected Discharge Plan: Skilled Nursing Facility Barriers to Discharge: Continued Medical Work up   Patient Goals and CMS Choice Patient states their goals for this hospitalization and ongoing recovery are:: Wants to be able to walk out with her walker to go home- agrees to [facility] CMS Medicare.gov Compare Post Acute Care list provided to:: Patient Choice offered to / list presented to : Patient      Expected Discharge Plan and Services   Discharge Planning Services: CM Consult Post Acute Care Choice: Skilled Nursing Facility Living arrangements for the past 2 months: Single Family Home                 DME Arranged: N/A         HH Arranged: NA          Prior Living Arrangements/Services Living arrangements for the past 2 months: Single Family Home Lives with:: Siblings Patient language and need for interpreter reviewed:: Yes Do you feel safe going back to the place where you live?: Yes      Need for Family Participation in Patient Care: Yes (Comment) Care giver support system in place?: Yes (comment) Current home services: DME Criminal Activity/Legal Involvement Pertinent to Current Situation/Hospitalization: No  - Comment as needed  Activities of Daily Living   ADL Screening (condition at time of admission) Independently performs ADLs?: Yes (appropriate for developmental age) Is the patient deaf or have difficulty hearing?: No Does the patient have difficulty seeing, even when wearing glasses/contacts?: No Does the patient have difficulty concentrating, remembering, or making decisions?: No  Permission Sought/Granted Permission sought to share information with : Facility Industrial/product Designer granted to share information with : Yes, Verbal Permission Granted     Permission granted to share info w AGENCY: SNF        Emotional Assessment Appearance:: Appears stated age Attitude/Demeanor/Rapport: Engaged Affect (typically observed): Accepting Orientation: : Oriented to Self, Oriented to Place, Oriented to  Time, Oriented to Situation Alcohol  / Substance Use: Not Applicable Psych Involvement: No (comment)  Admission diagnosis:  Peripheral edema [R60.0] Failure to thrive in adult [R62.7] Parkinson's disease with dyskinesia, unspecified whether manifestations fluctuate (HCC) [G20.B1] Patient Active Problem List   Diagnosis Date Noted   Failure to thrive in adult 09/16/2024   Hypotension 08/24/2024   Lower extremity edema 08/22/2024   Weakness 08/22/2024   Malnutrition of moderate degree 08/19/2024   UTI (urinary tract infection) 08/19/2024   Chronic anemia 08/19/2024   Syncope 08/18/2024   Rupture of ligament of right wrist 06/18/2024   Closed right  hip fracture (HCC) 06/17/2024   COVID-19 virus infection 06/02/2023   Constipation 07/18/2022   Vitamin B12 deficiency 07/16/2022   Vitamin D  deficiency 07/16/2022   Folate deficiency 07/16/2022   Lower extremity weakness 07/15/2022   Neuropathy 07/15/2022   Ambulatory dysfunction 07/14/2022   Hypomagnesemia 05/28/2022   Parkinson disease (HCC) 05/25/2022   Depression 05/25/2022   Frequent falls 02/09/2021   Multiple  fractures of ribs, left side, subsequent encounter for fracture with routine healing 02/08/2021   Polyneuropathy, unspecified 02/08/2021   Rhabdomyolysis 02/04/2021   Fall 02/03/2021   Osteopenia 04/07/2020   Major depressive disorder, recurrent, 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   PCP:  [name], MD Pharmacy:   [facility] [address], [place] - [address] [place] [address] Phone: [phone] Fax: [phone]  [facility] - [address]), [place] - [address] [place] ([address]) [place] [address] Phone: [phone] Fax: [phone]     Social Drivers of Health (SDOH) Social History: SDOH Screenings   Food Insecurity: Unknown (09/17/2024)  Housing: Low Risk (09/17/2024)  Transportation Needs: No Transportation Needs (09/17/2024)  Utilities: Not At Risk (09/17/2024)  Financial Resource Strain: Low Risk  (06/12/2024)   Received from [facility]  Social Connections: Moderately Integrated (09/17/2024)  Recent Concern: Social Connections - Moderately Isolated (08/18/2024)  Tobacco Use: Low Risk (09/16/2024)   SDOH Interventions:     Readmission Risk Interventions     No data to display

## 2024-09-19 NOTE — Progress Notes (Signed)
 Physical Therapy Treatment Patient Details Name: Michele Brewer MRN: [id number] DOB: Sep 26, 1950 Today's Date: 09/19/2024   History of Present Illness Pt is a 74 y.o. year old female with medical history of Parkinson's disease, MDD presenting to the ED with worsening lower extremity edema making ambulation difficult.  MD assessment includes: FTT, LE weakness, LE edema, and UTI.    PT Comments  Pt was pleasant and motivated to participate during the session and put forth good effort throughout. Pt required cuing for proper sequencing and physical assistance with all below functional tasks but grossly improved mobility compared to prior session.   Most notably pt was able to take several steps, albeit with great effort, from the chair to the bed this session with assistance both for stability as well as to guide the RW.  Pt reported no adverse symptoms during the session with SpO2 and HR WNL throughout on room air.  Pt will benefit from continued PT services upon discharge to safely address deficits listed in patient problem list for decreased caregiver assistance and eventual return to PLOF.       If plan is discharge home, recommend the following: Two people to help with walking and/or transfers;A lot of help with bathing/dressing/bathroom;Assistance with cooking/housework;Assist for transportation   Can travel by private vehicle     No  Equipment Recommendations  Other (comment) (TBD at next venue of care)    Recommendations for Other Services       Precautions / Restrictions Precautions Precautions: Fall Recall of Precautions/Restrictions: Intact Restrictions Weight Bearing Restrictions Per Provider Order: No     Mobility  Bed Mobility Overal bed mobility: Needs Assistance Bed Mobility: Sit to Sidelying, Rolling Rolling: Min assist       Sit to sidelying: Mod assist General bed mobility comments: Mod multi-modal cues for log roll sequencing with mod A for BLE and trunk control  along with use of the bed rails    Transfers Overall transfer level: Needs assistance Equipment used: Rolling walker (2 wheels) Transfers: Sit to/from Stand Sit to Stand: Mod assist           General transfer comment: Mod A to scoot to the edge of the chair, multi-modal cues for sequencing, and Mod A to come to full upright standing and to prevent posterior LOB upon initial stand    Ambulation/Gait Ambulation/Gait assistance: Min assist Gait Distance (Feet): 3 Feet Assistive device: Rolling walker (2 wheels) Gait Pattern/deviations: Trunk flexed, Step-through pattern, Decreased step length - right, Decreased step length - left, Shuffle Gait velocity: decreased     General Gait Details: Pt able to amb around 3 feet from chair to bed including several lateral and posterior steps with min A for stability and to guide the RW; very slow, effortful cadence with mostly shuffling steps   Stairs             Wheelchair Mobility     Tilt Bed    Modified Rankin (Stroke Patients Only)       Balance Overall balance assessment: Needs assistance Sitting-balance support: No upper extremity supported, Feet supported Sitting balance-Leahy Scale: Good     Standing balance support: Bilateral upper extremity supported, Reliant on assistive device for balance, During functional activity Standing balance-Leahy Scale: Poor Standing balance comment: physical assistance needed to prevent posterior LOB upon initial stand and during gait  Communication Communication Communication: No apparent difficulties Factors Affecting Communication: Reduced clarity of speech  Cognition Arousal: Alert Behavior During Therapy: Flat affect, WFL for tasks assessed/performed   PT - Cognitive impairments: No apparent impairments                         Following commands: Impaired Following commands impaired: Follows one step commands with increased  time    Cueing Cueing Techniques: Verbal cues, Tactile cues, Visual cues  Exercises Total Joint Exercises Ankle Circles/Pumps: Strengthening, AROM, Both, 5 reps, 10 reps Straight Leg Raises: AAROM, Strengthening, Both, 5 reps Long Arc Quad: AROM, Strengthening, Both, 5 reps Knee Flexion: AROM, Strengthening, Both, 5 reps    General Comments        Pertinent Vitals/Pain Pain Assessment Pain Assessment: 0-10 Pain Score: 2  Pain Location: back Pain Descriptors / Indicators: Sore Pain Intervention(s): Repositioned, Premedicated before session, Monitored during session    Home Living                          Prior Function            PT Goals (current goals can now be found in the care plan section) Progress towards PT goals: Progressing toward goals    Frequency    Min 2X/week      PT Plan      Co-evaluation              AM-PAC PT 6 Clicks Mobility   Outcome Measure  Help needed turning from your back to your side while in a flat bed without using bedrails?: A Little Help needed moving from lying on your back to sitting on the side of a flat bed without using bedrails?: A Lot Help needed moving to and from a bed to a chair (including a wheelchair)?: A Lot Help needed standing up from a chair using your arms (e.g., wheelchair or bedside chair)?: A Lot Help needed to walk in hospital room?: Total Help needed climbing 3-5 steps with a railing? : Total 6 Click Score: 11    End of Session Equipment Utilized During Treatment: Gait belt Activity Tolerance: Patient tolerated treatment well Patient left: in bed;with call bell/phone within reach;with bed alarm set Nurse Communication: Mobility status PT Visit Diagnosis: Unsteadiness on feet (R26.81);History of falling (Z91.81);Difficulty in walking, not elsewhere classified (R26.2);Muscle weakness (generalized) (M62.81)     Time: 8544-8477 PT Time Calculation (min) (ACUTE ONLY): 27 min  Charges:     $Therapeutic Exercise: 8-22 mins $Therapeutic Activity: 8-22 mins PT General Charges $$ ACUTE PT VISIT: 1 Visit                     D. Scott Lonzy Mato PT, DPT 09/19/2024, 3:35 PM

## 2024-09-20 DIAGNOSIS — G20A1 Parkinson's disease without dyskinesia, without mention of fluctuations: Secondary | ICD-10-CM | POA: Diagnosis not present

## 2024-09-20 DIAGNOSIS — R600 Localized edema: Secondary | ICD-10-CM

## 2024-09-20 DIAGNOSIS — R627 Adult failure to thrive: Secondary | ICD-10-CM | POA: Diagnosis not present

## 2024-09-20 DIAGNOSIS — Z9889 Other specified postprocedural states: Secondary | ICD-10-CM | POA: Diagnosis not present

## 2024-09-20 LAB — BASIC METABOLIC PANEL WITH GFR
Anion gap: 9 (ref 5–15)
BUN: 22 mg/dL (ref 8–23)
CO2: 33 mmol/L — ABNORMAL HIGH (ref 22–32)
Calcium: 9.8 mg/dL (ref 8.9–10.3)
Chloride: 100 mmol/L (ref 98–111)
Creatinine, Ser: 0.73 mg/dL (ref 0.44–1.00)
GFR, Estimated: >60 mL/min
Glucose, Bld: 85 mg/dL (ref 70–99)
Potassium: 3.7 mmol/L (ref 3.5–5.1)
Sodium: 142 mmol/L (ref 135–145)

## 2024-09-20 LAB — GLUCOSE, CAPILLARY: Glucose-Capillary: 89 mg/dL (ref 70–99)

## 2024-09-20 LAB — VITAMIN A: Vitamin A (Retinoic Acid): 21.6 ug/dL — ABNORMAL LOW (ref 22.0–69.5)

## 2024-09-20 MED ORDER — PREDNISONE 20 MG PO TABS
40.0000 mg | ORAL_TABLET | Freq: Every day | ORAL | Status: AC
  Administered 2024-09-20 – 2024-09-22 (×3): 40 mg via ORAL
  Filled 2024-09-20 (×3): qty 2

## 2024-09-20 MED ORDER — METHOCARBAMOL 500 MG PO TABS
500.0000 mg | ORAL_TABLET | Freq: Three times a day (TID) | ORAL | Status: AC
  Administered 2024-09-20 – 2024-09-21 (×6): 500 mg via ORAL
  Filled 2024-09-20 (×6): qty 1

## 2024-09-20 MED ORDER — HYDROCERIN EX CREA
TOPICAL_CREAM | Freq: Two times a day (BID) | CUTANEOUS | Status: DC
  Filled 2024-09-20 (×2): qty 113

## 2024-09-20 MED ORDER — PANTOPRAZOLE SODIUM 40 MG PO TBEC
40.0000 mg | DELAYED_RELEASE_TABLET | Freq: Every day | ORAL | Status: AC
  Administered 2024-09-20 – 2024-09-24 (×4): 40 mg via ORAL
  Filled 2024-09-20 (×6): qty 1

## 2024-09-20 NOTE — TOC Progression Note (Signed)
 Transition of Care [facility]) - Progression Note    Patient Details  Name: Michele Brewer MRN: [id number] Date of Birth: [birth year]  [facility] CM/SW Contact  Boen Sterbenz L Hristopher [place], [place] Phone Number: 09/20/2024, 4:14 PM  Clinical Narrative:     CSW attempted to meet with patient to review bed offers. No family at bedside. Patient was sound asleep and would not wake up even when her name was being called.   CSW will attempt to meet with patient tomorrow.   Expected Discharge Plan: Skilled Nursing Facility Barriers to Discharge: Continued Medical Work up               Expected Discharge Plan and Services   Discharge Planning Services: CM Consult Post Acute Care Choice: Skilled Nursing Facility Living arrangements for the past 2 months: Single Family Home                 DME Arranged: N/A         HH Arranged: NA           Social Drivers of Health (SDOH) Interventions SDOH Screenings   Food Insecurity: Unknown (09/17/2024)  Housing: Low Risk (09/17/2024)  Transportation Needs: No Transportation Needs (09/17/2024)  Utilities: Not At Risk (09/17/2024)  Financial Resource Strain: Low Risk  (06/12/2024)   Received from [facility]  Social Connections: Moderately Integrated (09/17/2024)  Recent Concern: Social Connections - Moderately Isolated (08/18/2024)  Tobacco Use: Low Risk (09/16/2024)    Readmission Risk Interventions     No data to display

## 2024-09-20 NOTE — Progress Notes (Addendum)
 She reports no significant change.  No significant change, she continues to guard against movements, and it is therefore difficult to tell how much of her lower extremity symptoms are true spasticity versus guarding against movement, but she does not have any other pathological findings including she has normal reflexes of her arms and knees, absent at the ankles, downgoing toes.  MRI is reviewed personally and is negative in the thoracic and cervical spine.  Without definite pathological evidence of myelopathy, I would favor adjusting her Parkinson's medications and continuing physical therapy.  She did have a low vitamin D  level which has been associated with myelopathy in the past and I would favor repletion as is already being done.  She has multiple contributing issues contributing to her gait problems including recent hip surgery, Parkinson's, lower extremity edema, deconditioning.  I would continue her on Sinemet  1.5 tabs at 7 AM, noon, 5 PM  No further workup at this time, please call if further questions or concerns.  Aisha Seals, MD [facility]   If 7pm- 7am, please page neurology on call as listed in AMION.

## 2024-09-20 NOTE — Plan of Care (Signed)

## 2024-09-20 NOTE — Plan of Care (Signed)

## 2024-09-20 NOTE — Progress Notes (Signed)
" °   09/20/24 0200  Provider Notification  Provider Name/Title Delayne Solian MD  Date Provider Notified 09/20/24  Time Provider Notified 0245  Method of Notification Page  Notification Reason Other (Comment) (FYI page from bladder scan; patient did void at 8pm and 0230 >300ML using the bed pan.)  Provider response No new orders  Date of Provider Response 09/20/24  Time of Provider Response 0258    "

## 2024-09-20 NOTE — Progress Notes (Signed)
 [facility] Progress Note  Patient: Michele Brewer    FMW:[id number]  DOA: 09/16/2024     Date of Service: the patient was seen and examined on 09/20/2024  Chief Complaint  Patient presents with   Leg Swelling   Brief hospital course:  Michele Brewer is a 74 y.o. year old female with medical history of Parkinson's disease, MDD presenting to the ED with worsening lower extremity edema making ambulation difficult. States it has been worsening for the last 6 weeks. She states it was abrupt weakness in her legs. States swelling and weakness occurred together. She was able to do her ADLs and iADLs prior to this but has been unable to to them.   ED workup: HDS stable.  Labs: CBC without leukocytosis, mild anemia at baseline.  BMP unremarkable. UA with signs of infection but no symptoms.  Urine culture pending. ProBNP elevated at 856.  CXR with low lung volumes but otherwise clear. Given pt's edema making it difficult for patient to ambulate,  [facility] contacted for admission.    Assessment and Plan:  # Parkinson disease, presented with lower extremity stiffness and difficulty ambulation S/p Cogentin  1 mg p.o. BID, dc/'d as per neuro Continue fall precaution PT and OT eval done, recommend SNF placement 1/2 neurology consulted, Rec MRI C-T spine. No spina compression, multilevel C-spine degenerative changes, no acute T-spine changes. Neurology recommended to continue vit E and B6, increased Sinemet  1.5 tablet p.o. 3 times a day. 1/3 started prednisone  40 mg p.o. daily x 3 days and Robaxin  500 mg p.o. 3 times daily x 3 doses. PPI for GI prophylaxis    # Elevated CK level: improved CK 699>>411 It could be secondary to immobility. Cannot give IV fluid because of lower extremity edema, patient is getting Lasix  for diuresis   # Lower extremity edema could be HFpEF proBNP elevated TTE shows LVEF 60 to 65% S/p Lasix  40 mg IV twice daily x 2 days, followed by 40 mg IV daily x 2 days Monitor  urine output and renal function stat Venous duplex negative for DVT   # Pyuria, UTI ruled out  UA positive, s/p ceftriaxone  1 g IV daily x 3 days urine culture growing multiple species, suggested reflex   # Urinary retention Bladder scan 286, Foley catheter was inserted and 325 mL urine was collected on 12/31 1/2 discontinued Foley, patient is voiding well   # Vitamin E  and vitamin B6 deficiency Started vitamin E  and vitamin B6 oral supplement B12 and folate within normal range  # Hypokalemia, potassium repleted. Monitor electrolytes and replete as needed.   Body mass index is 23.54 kg/m (pended).  Interventions:  Diet: Regular diet, fluid restriction 1.5 L/day DVT Prophylaxis: Subcutaneous Heparin     Advance goals of care discussion: DNR-intermittent  Family Communication: family was not present at bedside, at the time of interview.  The pt provided permission to discuss medical plan with the family. Opportunity was given to ask question and all questions were answered satisfactorily.   Disposition:  Pt is from home, admitted with lower extremity weakness due to Parkinson, still has weakness, which precludes a safe discharge. Discharge to SNF, when stable, most likely 1 to 2 days.  Subjective: No significant events overnight.  Lower extremity edema is getting better, patient has dry skin and wrinkles on the feet, she is concerned about diet, recommended to follow-up with PCP and possible referral to dermatology versus rheumatology depending on the condition at that time. Advised to apply Eucerin for dry  screen Patient does not feel any improvement in her both legs weakness, still unable to ambulate.  Awaiting for SNF placement.    Physical Exam: General: NAD, lying comfortably Appear in no distress, affect appropriate Eyes: PERRLA ENT: Oral Mucosa Clear, moist  Neck: no JVD,  Cardiovascular: S1 and S2 Present, no Murmur,  Respiratory: good respiratory effort,  Bilateral Air entry equal and Decreased, no Crackles, no wheezes Abdomen: Bowel Sound present, Soft and no tenderness,  Skin: no rashes Extremities: No pedal edema, resolved after diuresis Neurologic: Bilateral lower extremity stiffness most likely due to Parkinson disease, no any other focal deficits.   Gait not checked due to patient safety concerns  Vitals:   09/20/24 0330 09/20/24 0500 09/20/24 0734 09/20/24 1603  BP: (!) 104/56  112/62 114/68  Pulse: 71  71 73  Resp: 16  18 16   Temp: 98 F (36.7 C)  98.3 F (36.8 C) 98 F (36.7 C)  TempSrc:   Oral   SpO2: 100%  97% 100%  Weight:  62.2 kg    Height:        Intake/Output Summary (Last 24 hours) at 09/20/2024 1617 Last data filed at 09/20/2024 1041 Gross per 24 hour  Intake 480 ml  Output 1044 ml  Net -564 ml   Filed Weights   09/18/24 0500 09/19/24 0500 09/20/24 0500  Weight: 68.5 kg 65.8 kg 62.2 kg    Data Reviewed: I have personally reviewed and interpreted daily labs, tele strips, imagings as discussed above. I reviewed all nursing notes, pharmacy notes, vitals, pertinent old records I have discussed plan of care as described above with RN and patient/family.  CBC: Recent Labs  Lab 09/16/24 1758 09/17/24 0429 09/18/24 0528 09/19/24 1031  WBC 5.1 4.3 4.7 4.2  HGB 11.0* 10.3* 10.8* 12.2  HCT 31.8* 28.6* 29.4* 36.9  MCV 94.4 94.1 95.5 89.6  PLT 173 173 167 178   Basic Metabolic Panel: Recent Labs  Lab 09/16/24 1758 09/16/24 1953 09/17/24 0428 09/17/24 0429 09/18/24 0528 09/19/24 1031 09/20/24 0545  NA 139  --   --  141 138 140 142  K 3.9  --   --  3.3* 4.2 3.7 3.7  CL 104  --   --  104 101 98 100  CO2 25  --   --  28 28 32 33*  GLUCOSE 71  --   --  106* 82 83 85  BUN 17  --   --  18 16 19 22   CREATININE 0.66  --   --  0.80 0.67 0.77 0.73  CALCIUM 9.3  --   --  9.3 9.3 10.0 9.8  MG  --  2.0  --   --  2.0  --   --   PHOS  --   --  3.7  --  3.2  --   --     Studies: MR CERVICAL SPINE WO  CONTRAST Result Date: 09/20/2024 EXAM: MRI CERVICAL AND THORACIC SPINE WITHOUT CONTRAST 09/19/2024 09:33:51 PM TECHNIQUE: Multiplanar multisequence MRI of the cervical and thoracic spine was performed without the administration of intravenous contrast. COMPARISON: MR Cervical Spine 07/15/2022. CLINICAL HISTORY: Acute cervical myelopathy. History of Parkinson's disease. FINDINGS: BONES AND ALIGNMENT: Normal alignment. Normal vertebral body heights. Marrow signal is unremarkable. No abnormal enhancement. SPINAL CORD: Normal spinal cord size. Normal spinal cord signal. SOFT TISSUES: Unremarkable. CERVICAL DISC LEVELS: C2-C3: No significant disc herniation. No spinal canal stenosis or neural foraminal narrowing. C3-C4: Small disc bulge. No central  spinal canal or neural foraminal stenosis. C4-C5: Small disc bulge with right greater than left uncovertebral hypertrophy. Unchanged severe right and moderate left neural foraminal stenosis. Mild spinal canal stenosis. C5-C6: Small disc bulge with right greater than left uncovertebral hypertrophy. Moderate right and mild left neural foraminal stenosis. C6-C7: Small disc bulge with bilateral uncovertebral spurring. Moderate left neural foraminal stenosis. No spinal canal stenosis. C7-T1: No significant disc herniation. No spinal canal stenosis or neural foraminal narrowing. THORACIC DISC LEVELS: Mild midthoracic degenerative disc disease without spinal canal or neural foraminal stenosis. Incidental Abdominal Findings: Cholelithiasis. IMPRESSION: 1. At C4-5, small disc bulge with right greater than left uncovertebral hypertrophy, unchanged severe right and moderate left neural foraminal stenosis, and mild spinal canal stenosis. 2. At C5-6, small disc bulge with right greater than left uncovertebral hypertrophy and moderate right and mild left neural foraminal stenosis. 3. At C6-7, small disc bulge with bilateral uncovertebral spurring and moderate left neural foraminal stenosis,  without spinal canal stenosis. 4. Mild midthoracic degenerative disc disease without spinal canal or neural foraminal stenosis. Electronically signed by: Franky Stanford MD 09/20/2024 12:42 AM EST RP Workstation: [id number]   MR THORACIC SPINE WO CONTRAST Result Date: 09/20/2024 EXAM: MRI CERVICAL AND THORACIC SPINE WITHOUT CONTRAST 09/19/2024 09:33:51 PM TECHNIQUE: Multiplanar multisequence MRI of the cervical and thoracic spine was performed without the administration of intravenous contrast. COMPARISON: MR Cervical Spine 07/15/2022. CLINICAL HISTORY: Acute cervical myelopathy. History of Parkinson's disease. FINDINGS: BONES AND ALIGNMENT: Normal alignment. Normal vertebral body heights. Marrow signal is unremarkable. No abnormal enhancement. SPINAL CORD: Normal spinal cord size. Normal spinal cord signal. SOFT TISSUES: Unremarkable. CERVICAL DISC LEVELS: C2-C3: No significant disc herniation. No spinal canal stenosis or neural foraminal narrowing. C3-C4: Small disc bulge. No central spinal canal or neural foraminal stenosis. C4-C5: Small disc bulge with right greater than left uncovertebral hypertrophy. Unchanged severe right and moderate left neural foraminal stenosis. Mild spinal canal stenosis. C5-C6: Small disc bulge with right greater than left uncovertebral hypertrophy. Moderate right and mild left neural foraminal stenosis. C6-C7: Small disc bulge with bilateral uncovertebral spurring. Moderate left neural foraminal stenosis. No spinal canal stenosis. C7-T1: No significant disc herniation. No spinal canal stenosis or neural foraminal narrowing. THORACIC DISC LEVELS: Mild midthoracic degenerative disc disease without spinal canal or neural foraminal stenosis. Incidental Abdominal Findings: Cholelithiasis. IMPRESSION: 1. At C4-5, small disc bulge with right greater than left uncovertebral hypertrophy, unchanged severe right and moderate left neural foraminal stenosis, and mild spinal canal stenosis. 2. At  C5-6, small disc bulge with right greater than left uncovertebral hypertrophy and moderate right and mild left neural foraminal stenosis. 3. At C6-7, small disc bulge with bilateral uncovertebral spurring and moderate left neural foraminal stenosis, without spinal canal stenosis. 4. Mild midthoracic degenerative disc disease without spinal canal or neural foraminal stenosis. Electronically signed by: Franky Stanford MD 09/20/2024 12:42 AM EST RP Workstation: [id number]     Scheduled Meds:  carbidopa -levodopa   1.5 tablet Oral TID   Chlorhexidine  Gluconate Cloth  6 each Topical Daily   heparin   5,000 Units Subcutaneous Q8H   methocarbamol   500 mg Oral TID   pantoprazole   40 mg Oral Daily   predniSONE   40 mg Oral Q breakfast   pyridOXINE   100 mg Oral Daily   sodium chloride  flush  3 mL Intravenous Q12H   vitamin E   400 Units Oral Daily   Continuous Infusions:   PRN Meds: acetaminophen  **OR** acetaminophen , ondansetron  **OR** ondansetron  (ZOFRAN ) IV, senna-docusate  Time spent: 55 minutes  Author: ELVAN KENNER MD [facility] 09/20/2024 4:17 PM  To reach On-call, see care teams to locate the attending and reach out to them via [email or web address]. If 7PM-7AM, please contact night-coverage If you still have difficulty reaching the attending provider, please page the [facility] (Director on Call) for [facility] on amion for assistance.

## 2024-09-21 DIAGNOSIS — R627 Adult failure to thrive: Secondary | ICD-10-CM | POA: Diagnosis not present

## 2024-09-21 LAB — GLUCOSE, CAPILLARY: Glucose-Capillary: 123 mg/dL — ABNORMAL HIGH (ref 70–99)

## 2024-09-21 MED ORDER — TRAMADOL HCL 50 MG PO TABS
25.0000 mg | ORAL_TABLET | Freq: Four times a day (QID) | ORAL | Status: DC | PRN
  Administered 2024-09-22: 50 mg via ORAL
  Filled 2024-09-21: qty 1

## 2024-09-21 MED ORDER — ACETAMINOPHEN 325 MG PO TABS
650.0000 mg | ORAL_TABLET | Freq: Three times a day (TID) | ORAL | Status: DC
  Administered 2024-09-21 – 2024-09-24 (×12): 650 mg via ORAL
  Filled 2024-09-21 (×12): qty 2

## 2024-09-21 MED ORDER — TRAMADOL HCL 50 MG PO TABS
25.0000 mg | ORAL_TABLET | Freq: Once | ORAL | Status: AC
  Administered 2024-09-21: 25 mg via ORAL
  Filled 2024-09-21: qty 1

## 2024-09-21 NOTE — Progress Notes (Signed)
 Physical Therapy Treatment Patient Details Name: Michele Brewer MRN: [id number] DOB: 1951-07-18 Today's Date: 09/21/2024   History of Present Illness Pt is a 74 y.o. year old female with medical history of Parkinson's disease, MDD presenting to the ED with worsening lower extremity edema making ambulation difficult.  MD assessment includes: FTT, LE weakness, LE edema, and UTI.    PT Comments  Pt ready for session in recliner.  She stands with inc time and light min a to get fully upright.  She is able to progress gait 30' with heavy reliance on RW for support and balance with generally slow gait.  +1 hands on assist at all times.  She takes a seated rest break but is motivated to stand for standing ex with walker support.  Pt highly motivated to improve mobility.  Pt lives with mom and sister who are both walker dependant and unable to physically assist her.  < 3 hrs a day therapy remains appropriate for discharge.   If plan is discharge home, recommend the following: A little help with walking and/or transfers;A little help with bathing/dressing/bathroom;Assistance with cooking/housework;Assist for transportation;Help with stairs or ramp for entrance   Can travel by private vehicle        Equipment Recommendations       Recommendations for Other Services       Precautions / Restrictions Precautions Precautions: Fall Recall of Precautions/Restrictions: Intact Restrictions Weight Bearing Restrictions Per Provider Order: No     Mobility  Bed Mobility               General bed mobility comments: in recliner before and after session Patient Response: Cooperative  Transfers Overall transfer level: Needs assistance Equipment used: Rolling walker (2 wheels) Transfers: Sit to/from Stand Sit to Stand: Min assist           General transfer comment: inc time    Ambulation/Gait Ambulation/Gait assistance: Min assist Gait Distance (Feet): 50 Feet Assistive device: Rolling  walker (2 wheels) Gait Pattern/deviations: Trunk flexed, Step-through pattern, Decreased step length - right, Decreased step length - left, Shuffle Gait velocity: decreased     General Gait Details: slow gait able to walk around bed x 2 without seated rest.  heavy reliance on RW.  fatigued but motivated to keep trying   Stairs             Wheelchair Mobility     Tilt Bed Tilt Bed Patient Response: Cooperative  Modified Rankin (Stroke Patients Only)       Balance Overall balance assessment: Needs assistance Sitting-balance support: No upper extremity supported, Feet supported Sitting balance-Leahy Scale: Good     Standing balance support: Bilateral upper extremity supported, Reliant on assistive device for balance, During functional activity Standing balance-Leahy Scale: Poor Standing balance comment: hand on assist at all times, heavy reliance on RW for support.  inc fall risk                            Communication Communication Communication: No apparent difficulties Factors Affecting Communication: Reduced clarity of speech  Cognition Arousal: Alert Behavior During Therapy: Flat affect, WFL for tasks assessed/performed   PT - Cognitive impairments: No apparent impairments                         Following commands: Impaired Following commands impaired: Follows one step commands with increased time    Cueing Cueing Techniques: Verbal cues,  Tactile cues, Visual cues  Exercises Other Exercises Other Exercises: standing ex with walker support.  marches, heel raises and SLR limited by fatigue but motivated to try    General Comments        Pertinent Vitals/Pain Pain Assessment Pain Assessment: 0-10 Pain Score: 7  Pain Location: R hip - prior hip fx.  stated it does not typically hurt Pain Descriptors / Indicators: Sore Pain Intervention(s): Limited activity within patient's tolerance, Monitored during session, Repositioned     Home Living                          Prior Function            PT Goals (current goals can now be found in the care plan section) Progress towards PT goals: Progressing toward goals    Frequency    Min 2X/week      PT Plan      Co-evaluation              AM-PAC PT 6 Clicks Mobility   Outcome Measure  Help needed turning from your back to your side while in a flat bed without using bedrails?: A Little Help needed moving from lying on your back to sitting on the side of a flat bed without using bedrails?: A Little Help needed moving to and from a bed to a chair (including a wheelchair)?: A Little Help needed standing up from a chair using your arms (e.g., wheelchair or bedside chair)?: A Little Help needed to walk in hospital room?: A Little Help needed climbing 3-5 steps with a railing? : A Lot 6 Click Score: 17    End of Session Equipment Utilized During Treatment: Gait belt Activity Tolerance: Patient tolerated treatment well Patient left: in chair;with call bell/phone within reach;with chair alarm set Nurse Communication: Mobility status PT Visit Diagnosis: Unsteadiness on feet (R26.81);History of falling (Z91.81);Difficulty in walking, not elsewhere classified (R26.2);Muscle weakness (generalized) (M62.81)     Time: 9163-9146 PT Time Calculation (min) (ACUTE ONLY): 17 min  Charges:    $Gait Training: 8-22 mins PT General Charges $$ ACUTE PT VISIT: 1 Visit                   Lauraine Gills, PTA 09/21/2024, 9:12 AM

## 2024-09-21 NOTE — Progress Notes (Signed)
 [facility] Progress Note  Patient: Michele Brewer    FMW:[id number]  DOA: 09/16/2024     Date of Service: the patient was seen and examined on 09/21/2024  Chief Complaint  Patient presents with   Leg Swelling   Brief hospital course:  Michele Brewer is a 74 y.o. year old female with medical history of Parkinson's disease, MDD presenting to the ED with worsening lower extremity edema making ambulation difficult. States it has been worsening for the last 6 weeks. She states it was abrupt weakness in her legs. States swelling and weakness occurred together. She was able to do her ADLs and iADLs prior to this but has been unable to to them.   ED workup: HDS stable.  Labs: CBC without leukocytosis, mild anemia at baseline.  BMP unremarkable. UA with signs of infection but no symptoms.  Urine culture pending. ProBNP elevated at 856.  CXR with low lung volumes but otherwise clear. Given pt's edema making it difficult for patient to ambulate,  [facility] contacted for admission.    Assessment and Plan:  # Parkinson disease, presented with lower extremity stiffness and difficulty ambulation S/p Cogentin  1 mg p.o. BID, dc/'d as per neuro Continue fall precaution PT and OT eval done, recommend SNF placement 1/2 neurology consulted, Rec MRI C-T spine. No spina compression, multilevel C-spine degenerative changes, no acute T-spine changes. Neurology recommended to continue vit E and B6, increased Sinemet  1.5 tablet p.o. 3 times a day. 1/3 started prednisone  40 mg p.o. daily x 3 days and Robaxin  500 mg p.o. 3 times daily x 3 doses. PPI for GI prophylaxis 1/4 patient is feeling little bit improvement, still complaining of pain in bilateral legs extending to her hips. Started Tylenol  650 mg p.o. 3 times daily scheduled and tramadol  12.5 mg p.o. every 6 hourly as needed   # Elevated CK level: improved CK [id number] It could be secondary to immobility. Cannot give IV fluid because of lower extremity  edema, patient is getting Lasix  for diuresis   # Lower extremity edema could be HFpEF 1/4 edema resolved, s/p IV Lasix  proBNP elevated TTE shows LVEF 60 to 65% S/p Lasix  40 mg IV twice daily x 2 days, followed by 40 mg IV daily x 2 days Monitor urine output and renal function stat Venous duplex negative for DVT   # Pyuria, UTI ruled out  UA positive, s/p ceftriaxone  1 g IV daily x 3 days urine culture growing multiple species, suggested reflex   # Urinary retention Bladder scan 286, Foley catheter was inserted and 325 mL urine was collected on 12/31 1/2 discontinued Foley, patient is voiding well   # Vitamin E  and vitamin B6 deficiency Started vitamin E  and vitamin B6 oral supplement B12 and folate within normal range  # Hypokalemia, potassium repleted. Monitor electrolytes and replete as needed.   Body mass index is 21.99 kg/m (pended).  Interventions:  Diet: Regular diet, fluid restriction 1.5 L/day DVT Prophylaxis: Subcutaneous Heparin     Advance goals of care discussion: DNR-intermittent  Family Communication: family was not present at bedside, at the time of interview.  The pt provided permission to discuss medical plan with the family. Opportunity was given to ask question and all questions were answered satisfactorily.   Disposition:  Pt is from home, admitted with lower extremity weakness due to Parkinson, still has weakness, which precludes a safe discharge. Discharge to SNF, when stable, most likely 1 to 2 days.  Subjective: No significant events overnight.  Patient feels little  bit improvement in her bilateral lower extremities weakness and stiffness.  Complaining of pain extending from bilateral lower extremities to hips. Patient is willing to trial low-dose opiates. Started tramadol      Physical Exam: General: NAD, lying comfortably Appear in no distress, affect appropriate Eyes: PERRLA ENT: Oral Mucosa Clear, moist  Neck: no JVD,   Cardiovascular: S1 and S2 Present, no Murmur,  Respiratory: good respiratory effort, Bilateral Air entry equal and Decreased, no Crackles, no wheezes Abdomen: Bowel Sound present, Soft and no tenderness,  Skin: no rashes Extremities: No pedal edema, resolved after diuresis Neurologic: Bilateral lower extremity stiffness most likely due to Parkinson disease, no any other focal deficits.   Gait not checked due to patient safety concerns  Vitals:   09/20/24 1954 09/21/24 0407 09/21/24 0500 09/21/24 0724  BP: (!) 87/52 119/72  109/64  Pulse: 79 (!) 45  69  Resp: 16 16  16   Temp: 98.4 F (36.9 C) 98 F (36.7 C)  97.7 F (36.5 C)  TempSrc: Oral Oral    SpO2: 100% 98%  100%  Weight:   58.1 kg   Height:        Intake/Output Summary (Last 24 hours) at 09/21/2024 1230 Last data filed at 09/21/2024 0900 Gross per 24 hour  Intake 480 ml  Output 650 ml  Net -170 ml   Filed Weights   09/19/24 0500 09/20/24 0500 09/21/24 0500  Weight: 65.8 kg 62.2 kg 58.1 kg    Data Reviewed: I have personally reviewed and interpreted daily labs, tele strips, imagings as discussed above. I reviewed all nursing notes, pharmacy notes, vitals, pertinent old records I have discussed plan of care as described above with RN and patient/family.  CBC: Recent Labs  Lab 09/16/24 1758 09/17/24 0429 09/18/24 0528 09/19/24 1031  WBC 5.1 4.3 4.7 4.2  HGB 11.0* 10.3* 10.8* 12.2  HCT 31.8* 28.6* 29.4* 36.9  MCV 94.4 94.1 95.5 89.6  PLT 173 173 167 178   Basic Metabolic Panel: Recent Labs  Lab 09/16/24 1758 09/16/24 1953 09/17/24 0428 09/17/24 0429 09/18/24 0528 09/19/24 1031 09/20/24 0545  NA 139  --   --  141 138 140 142  K 3.9  --   --  3.3* 4.2 3.7 3.7  CL 104  --   --  104 101 98 100  CO2 25  --   --  28 28 32 33*  GLUCOSE 71  --   --  106* 82 83 85  BUN 17  --   --  18 16 19 22   CREATININE 0.66  --   --  0.80 0.67 0.77 0.73  CALCIUM 9.3  --   --  9.3 9.3 10.0 9.8  MG  --  2.0  --   --  2.0   --   --   PHOS  --   --  3.7  --  3.2  --   --     Studies: No results found.    Scheduled Meds:  acetaminophen   650 mg Oral TID   carbidopa -levodopa   1.5 tablet Oral TID   Chlorhexidine  Gluconate Cloth  6 each Topical Daily   heparin   5,000 Units Subcutaneous Q8H   hydrocerin   Topical BID   methocarbamol   500 mg Oral TID   pantoprazole   40 mg Oral Daily   predniSONE   40 mg Oral Q breakfast   pyridOXINE   100 mg Oral Daily   sodium chloride  flush  3 mL Intravenous Q12H  vitamin E   400 Units Oral Daily   Continuous Infusions:   PRN Meds: ondansetron  **OR** ondansetron  (ZOFRAN ) IV, senna-docusate, traMADol   Time spent: 55 minutes  Author: ELVAN SOR. MD [facility] 09/21/2024 12:30 PM  To reach On-call, see care teams to locate the attending and reach out to them via [email or web address]. If 7PM-7AM, please contact night-coverage If you still have difficulty reaching the attending provider, please page the [facility] (Director on Call) for [facility] on amion for assistance.

## 2024-09-21 NOTE — Progress Notes (Signed)
 Mobility Specialist - Progress Note     09/21/24 1450  Mobility  Activity Stood at bedside;Ambulated with assistance  Level of Assistance Contact guard assist, steadying assist  Assistive Device Front wheel walker  Distance Ambulated (ft) 40 ft  Range of Motion/Exercises Active  Activity Response Tolerated well  Mobility Referral Yes  Mobility visit 1 Mobility   Pt resting in bed on RA upon entry. Pt STS and ambulates to sink and around room x2 performing hygiene tasking CGA with RW. Pt returned to EOB and left with needs in reach. Bed alarm activated.   Michele Brewer Mobility Specialist 09/21/2024, 3:22 PM

## 2024-09-21 NOTE — TOC Progression Note (Signed)
 Transition of Care [facility]) - Initial/Assessment Note    Patient Details  Name: Michele Brewer MRN: [id number] Date of Birth: 08-17-1951  Transition of Care [facility]) CM/SW Contact:    Ezzie Senat L Marrion Finan, LCSW Phone Number: 09/21/2024, 3:37 PM  Clinical Narrative:                   CSW met with patient. No family at bedside. CSW reviewed bed offers with patient. Patient chose [facility]. [facility] selected in portal for Skilled Nursing.   Shara will be started.    Expected Discharge Plan: Skilled Nursing Facility Barriers to Discharge: Continued Medical Work up   Patient Goals and CMS Choice Patient states their goals for this hospitalization and ongoing recovery are:: Wants to be able to walk out with her walker to go home- agrees to [facility] CMS Medicare.gov Compare Post Acute Care list provided to:: Patient Choice offered to / list presented to : Patient      Expected Discharge Plan and Services   Discharge Planning Services: CM Consult Post Acute Care Choice: Skilled Nursing Facility Living arrangements for the past 2 months: Single Family Home                 DME Arranged: N/A         HH Arranged: NA          Prior Living Arrangements/Services Living arrangements for the past 2 months: Single Family Home Lives with:: Siblings Patient language and need for interpreter reviewed:: Yes Do you feel safe going back to the place where you live?: Yes      Need for Family Participation in Patient Care: Yes (Comment) Care giver support system in place?: Yes (comment) Current home services: DME Criminal Activity/Legal Involvement Pertinent to Current Situation/Hospitalization: No - Comment as needed  Activities of Daily Living   ADL Screening (condition at time of admission) Independently performs ADLs?: Yes (appropriate for developmental age) Is the patient deaf or have difficulty hearing?: No Does the patient have difficulty seeing, even when wearing  glasses/contacts?: No Does the patient have difficulty concentrating, remembering, or making decisions?: No  Permission Sought/Granted Permission sought to share information with : Facility Industrial/product Designer granted to share information with : Yes, Verbal Permission Granted     Permission granted to share info w AGENCY: SNF        Emotional Assessment Appearance:: Appears stated age Attitude/Demeanor/Rapport: Engaged Affect (typically observed): Accepting Orientation: : Oriented to Self, Oriented to Place, Oriented to  Time, Oriented to Situation Alcohol  / Substance Use: Not Applicable Psych Involvement: No (comment)  Admission diagnosis:  Peripheral edema [R60.0] Failure to thrive in adult [R62.7] Parkinson's disease with dyskinesia, unspecified whether manifestations fluctuate (HCC) [G20.B1] Patient Active Problem List   Diagnosis Date Noted   Failure to thrive in adult 09/16/2024   Hypotension 08/24/2024   Lower extremity edema 08/22/2024   Weakness 08/22/2024   Malnutrition of moderate degree 08/19/2024   UTI (urinary tract infection) 08/19/2024   Chronic anemia 08/19/2024   Syncope 08/18/2024   Rupture of ligament of right wrist 06/18/2024   Closed right hip fracture (HCC) 06/17/2024   COVID-19 virus infection 06/02/2023   Constipation 07/18/2022   Vitamin B12 deficiency 07/16/2022   Vitamin D  deficiency 07/16/2022   Folate deficiency 07/16/2022   Lower extremity weakness 07/15/2022   Neuropathy 07/15/2022   Ambulatory dysfunction 07/14/2022   Hypomagnesemia 05/28/2022   Parkinson disease (HCC) 05/25/2022   Depression 05/25/2022   Frequent falls 02/09/2021  Multiple fractures of ribs, left side, subsequent encounter for fracture with routine healing 02/08/2021   Polyneuropathy, unspecified 02/08/2021   Rhabdomyolysis 02/04/2021   Fall 02/03/2021   Osteopenia 04/07/2020   Major depressive disorder, recurrent, 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   PCP:   [name], MD Pharmacy:   [facility] [address] [place] - [address] [place] [address] Phone: [phone] Fax: [phone]  [facility] [address]), [place] - [address] [place]) [place] [address] Phone: [phone] Fax: [phone]     Social Drivers of Health (SDOH) Social History: SDOH Screenings   Food Insecurity: Unknown (09/17/2024)  Housing: Low Risk (09/17/2024)  Transportation Needs: No Transportation Needs (09/17/2024)  Utilities: Not At Risk (09/17/2024)  Financial Resource Strain: Low Risk  (06/12/2024)   Received from [facility]  Social Connections: Moderately Integrated (09/17/2024)  Recent Concern: Social Connections - Moderately Isolated (08/18/2024)  Tobacco Use: Low Risk (09/16/2024)   SDOH Interventions:     Readmission Risk Interventions     No data to display

## 2024-09-21 NOTE — Plan of Care (Signed)
" °  Problem: Education: Goal: Knowledge of General Education information will improve Description: Including pain rating scale, medication(s)/side effects and non-pharmacologic comfort measures 09/21/2024 1530 by Thayne Asberry PARAS, RN Outcome: Progressing 09/21/2024 1530 by Thayne Asberry PARAS, RN Outcome: Progressing   Problem: Health Behavior/Discharge Planning: Goal: Ability to manage health-related needs will improve 09/21/2024 1530 by Thayne Asberry PARAS, RN Outcome: Progressing 09/21/2024 1530 by Thayne Asberry PARAS, RN Outcome: Progressing   Problem: Clinical Measurements: Goal: Ability to maintain clinical measurements within normal limits will improve 09/21/2024 1530 by Thayne Asberry PARAS, RN Outcome: Progressing 09/21/2024 1530 by Thayne Asberry PARAS, RN Outcome: Progressing Goal: Will remain free from infection 09/21/2024 1530 by Thayne Asberry PARAS, RN Outcome: Progressing 09/21/2024 1530 by Thayne Asberry PARAS, RN Outcome: Progressing Goal: Diagnostic test results will improve 09/21/2024 1530 by Thayne Asberry PARAS, RN Outcome: Progressing 09/21/2024 1530 by Thayne Asberry PARAS, RN Outcome: Progressing Goal: Respiratory complications will improve 09/21/2024 1530 by Thayne Asberry PARAS, RN Outcome: Progressing 09/21/2024 1530 by Thayne Asberry PARAS, RN Outcome: Progressing Goal: Cardiovascular complication will be avoided 09/21/2024 1530 by Thayne Asberry PARAS, RN Outcome: Progressing 09/21/2024 1530 by Thayne Asberry PARAS, RN Outcome: Progressing   Problem: Activity: Goal: Risk for activity intolerance will decrease 09/21/2024 1530 by Thayne Asberry PARAS, RN Outcome: Progressing 09/21/2024 1530 by Thayne Asberry PARAS, RN Outcome: Progressing   Problem: Nutrition: Goal: Adequate nutrition will be maintained 09/21/2024 1530 by Thayne Asberry PARAS, RN Outcome: Progressing 09/21/2024 1530 by Thayne Asberry PARAS, RN Outcome: Progressing   Problem: Coping: Goal: Level of anxiety will decrease 09/21/2024  1530 by Thayne Asberry PARAS, RN Outcome: Progressing 09/21/2024 1530 by Thayne Asberry PARAS, RN Outcome: Progressing   Problem: Elimination: Goal: Will not experience complications related to bowel motility 09/21/2024 1530 by Thayne Asberry PARAS, RN Outcome: Progressing 09/21/2024 1530 by Thayne Asberry PARAS, RN Outcome: Progressing Goal: Will not experience complications related to urinary retention 09/21/2024 1530 by Thayne Asberry PARAS, RN Outcome: Progressing 09/21/2024 1530 by Thayne Asberry PARAS, RN Outcome: Progressing   Problem: Pain Managment: Goal: General experience of comfort will improve and/or be controlled 09/21/2024 1530 by Thayne Asberry PARAS, RN Outcome: Progressing 09/21/2024 1530 by Thayne Asberry PARAS, RN Outcome: Progressing   Problem: Safety: Goal: Ability to remain free from injury will improve 09/21/2024 1530 by Thayne Asberry PARAS, RN Outcome: Progressing 09/21/2024 1530 by Thayne Asberry PARAS, RN Outcome: Progressing   Problem: Skin Integrity: Goal: Risk for impaired skin integrity will decrease 09/21/2024 1530 by Thayne Asberry PARAS, RN Outcome: Progressing 09/21/2024 1530 by Thayne Asberry PARAS, RN Outcome: Progressing   "

## 2024-09-22 LAB — GLUCOSE, CAPILLARY
Glucose-Capillary: 94 mg/dL (ref 70–99)
Glucose-Capillary: 100 mg/dL — ABNORMAL HIGH (ref 70–99)

## 2024-09-22 MED ORDER — TRAMADOL HCL 50 MG PO TABS
50.0000 mg | ORAL_TABLET | Freq: Four times a day (QID) | ORAL | Status: AC
  Administered 2024-09-22 (×2): 50 mg via ORAL
  Filled 2024-09-22 (×2): qty 1

## 2024-09-22 MED ORDER — METHOCARBAMOL 500 MG PO TABS
500.0000 mg | ORAL_TABLET | Freq: Three times a day (TID) | ORAL | Status: DC
  Administered 2024-09-22 – 2024-09-25 (×9): 500 mg via ORAL
  Filled 2024-09-22 (×9): qty 1

## 2024-09-22 NOTE — Progress Notes (Signed)
 Occupational Therapy Treatment Patient Details Name: Michele Brewer MRN: [id number] DOB: 1951/02/24 Today's Date: 09/22/2024   History of present illness Pt is a 74 y.o. year old female with medical history of Parkinson's disease, MDD presenting to the ED with worsening lower extremity edema making ambulation difficult.  MD assessment includes: FTT, LE weakness, LE edema, and UTI.   OT comments  Pt seen for OT tx. Pt agreeable. She requires increased time/effort to complete bed mobility but no direct assist required. CGA-MIN A to stand from EOB and from [facility] over toilet with RW. Slow but speed improved with cues for over exaggerating movements. MIN A for clothing mgt but pt able to complete pericare herself. Pt tolerated standing at the sink for grooming, BLE ex, and washing face and brushing teeth. MIN A for BLE mgt back to bed as pt endorsed getting muscle cramp. Pt demonstrating improvement and progress towards goals. Continues to benefit.        If plan is discharge home, recommend the following:  A little help with walking and/or transfers;A little help with bathing/dressing/bathroom;Assist for transportation   Equipment Recommendations  Other (comment) (defer)    Recommendations for Other Services      Precautions / Restrictions Precautions Precautions: Fall Recall of Precautions/Restrictions: Intact Restrictions Weight Bearing Restrictions Per Provider Order: No       Mobility Bed Mobility Overal bed mobility: Needs Assistance Bed Mobility: Supine to Sit, Sit to Supine     Supine to sit: Supervision, Used rails     General bed mobility comments: incr time/effort to complete    Transfers Overall transfer level: Needs assistance Equipment used: Rolling walker (2 wheels) Transfers: Sit to/from Stand Sit to Stand: Min assist                 Balance Overall balance assessment: Needs assistance Sitting-balance support: No upper extremity supported, Feet  supported Sitting balance-Leahy Scale: Good     Standing balance support: No upper extremity supported, During functional activity Standing balance-Leahy Scale: Fair                             ADL either performed or assessed with clinical judgement   ADL Overall ADL's : Needs assistance/impaired     Grooming: Standing;Contact guard assist;Wash/dry hands;Supervision/safety;Wash/dry face;Oral care Grooming Details (indicate cue type and reason): tolerated well standing at the sink                 Toilet Transfer: Contact guard assist;Ambulation;Regular Toilet;BSC/3in1;Rolling walker (2 wheels) Toilet Transfer Details (indicate cue type and reason): BSC over toilet, VC for reaching back for arm rests Toileting- Clothing Manipulation and Hygiene: Minimal assistance;Contact guard assist;Sit to/from stand Toileting - Clothing Manipulation Details (indicate cue type and reason): MIN A for clothing mgt in standing     Functional mobility during ADLs: Contact guard assist;Rolling walker (2 wheels)      Extremity/Trunk Assessment              Vision       Perception     Praxis     Communication Communication Communication: No apparent difficulties   Cognition Arousal: Alert Behavior During Therapy: Flat affect, WFL for tasks assessed/performed Cognition: No apparent impairments                               Following commands: Intact Following commands impaired: Follows one step  commands with increased time      Cueing   Cueing Techniques: Verbal cues, Tactile cues, Visual cues  Exercises Other Exercises Other Exercises: standing ex at sink for heel raises, marching.    Shoulder Instructions       General Comments      Pertinent Vitals/ Pain       Pain Assessment Pain Assessment: 0-10 Pain Score: 2  Faces Pain Scale: Hurts a little bit Pain Location: all over aches, Bilat knees Pain Descriptors / Indicators: Aching Pain  Intervention(s): Monitored during session, Repositioned  Home Living                                          Prior Functioning/Environment              Frequency  Min 2X/week        Progress Toward Goals  OT Goals(current goals can now be found in the care plan section)  Progress towards OT goals: Progressing toward goals  Acute Rehab OT Goals Patient Stated Goal: get stronger OT Goal Formulation: With patient Time For Goal Achievement: 10/02/24 Potential to Achieve Goals: Good  Plan      Co-evaluation                 AM-PAC OT 6 Clicks Daily Activity     Outcome Measure   Help from another person eating meals?: None Help from another person taking care of personal grooming?: A Little Help from another person toileting, which includes using toliet, bedpan, or urinal?: A Little Help from another person bathing (including washing, rinsing, drying)?: A Lot Help from another person to put on and taking off regular upper body clothing?: A Little Help from another person to put on and taking off regular lower body clothing?: A Lot 6 Click Score: 17    End of Session Equipment Utilized During Treatment: Rolling walker (2 wheels)  OT Visit Diagnosis: Other abnormalities of gait and mobility (R26.89);Muscle weakness (generalized) (M62.81)   Activity Tolerance Patient tolerated treatment well   Patient Left in bed;with call bell/phone within reach;with bed alarm set   Nurse Communication          Time: 8489-8457 OT Time Calculation (min): 32 min  Charges: OT General Charges $OT Visit: 1 Visit OT Treatments $Self Care/Home Management : 23-37 mins  Warren SAUNDERS., MPH, MS, OTR/L ascom [phone] 09/22/2024, 3:46 PM

## 2024-09-22 NOTE — Progress Notes (Signed)
 [facility] Progress Note  Patient: Michele Brewer    FMW:[id number]  DOA: 09/16/2024     Date of Service: the patient was seen and examined on 09/22/2024  Chief Complaint  Patient presents with   Leg Swelling   Brief hospital course:  Michele Brewer is a 74 y.o. year old female with medical history of Parkinson's disease, MDD presenting to the ED with worsening lower extremity edema making ambulation difficult. States it has been worsening for the last 6 weeks. She states it was abrupt weakness in her legs. States swelling and weakness occurred together. She was able to do her ADLs and iADLs prior to this but has been unable to to them.   ED workup: HDS stable.  Labs: CBC without leukocytosis, mild anemia at baseline.  BMP unremarkable. UA with signs of infection but no symptoms.  Urine culture pending. ProBNP elevated at 856.  CXR with low lung volumes but otherwise clear. Given pt's edema making it difficult for patient to ambulate,  [facility] contacted for admission.    Assessment and Plan:  # Parkinson disease, presented with lower extremity stiffness and difficulty ambulation S/p Cogentin  1 mg p.o. BID, dc/'d as per neuro Continue fall precaution PT and OT eval done, recommend SNF placement 1/2 neurology consulted, Rec MRI C-T spine. No spina compression, multilevel C-spine degenerative changes, no acute T-spine changes. Neurology recommended to continue vit E and B6, increased Sinemet  1.5 tablet p.o. 3 times a day. 1/3 started prednisone  40 mg p.o. daily x 3 days and  1/3 Robaxin  500 mg p.o. 3 times daily  PPI for GI prophylaxis 1/4 patient is feeling little bit improvement, still complaining of pain in bilateral legs extending to her hips. Started Tylenol  650 mg p.o. 3 times daily scheduled and tramadol  12.5 mg p.o. every 6 hourly as needed 1/5 Tramadol  50 mg po q6 x 2 doses scheduled    # Elevated CK level: improved CK [id number] It could be secondary to immobility. Cannot  give IV fluid because of lower extremity edema, patient is getting Lasix  for diuresis   # Lower extremity edema could be HFpEF 1/4 edema resolved, s/p IV Lasix  proBNP elevated TTE shows LVEF 60 to 65% S/p Lasix  40 mg IV twice daily x 2 days, followed by 40 mg IV daily x 2 days Monitor urine output and renal function stat Venous duplex negative for DVT   # Pyuria, UTI ruled out  UA positive, s/p ceftriaxone  1 g IV daily x 3 days urine culture growing multiple species, suggested reflex   # Urinary retention Bladder scan 286, Foley catheter was inserted and 325 mL urine was collected on 12/31 1/2 discontinued Foley, patient is voiding well   # Vitamin E  and vitamin B6 deficiency Started vitamin E  and vitamin B6 oral supplement B12 and folate within normal range  # Hypokalemia, potassium repleted. Monitor electrolytes and replete as needed.   Body mass index is 21.99 kg/m (pended).  Interventions:  Diet: Regular diet, fluid restriction 1.5 L/day DVT Prophylaxis: Subcutaneous Heparin     Advance goals of care discussion: DNR-intermittent  Family Communication: family was not present at bedside, at the time of interview.  The pt provided permission to discuss medical plan with the family. Opportunity was given to ask question and all questions were answered satisfactorily.   Disposition:  Pt is from home, admitted with lower extremity weakness due to Parkinson. Stable to DC now Discharge to SNF, when bed will be available. Follow TOC for dispo plan  Subjective: No significant events overnight.  Patient is feeling little bit improvement in her lower extremities, stated that muscle relaxer helped her so restarted Robaxin  today.  Patient agreed for tramadol , started 50 mg p.o. Q6 hourly x 2 doses and then as needed    Physical Exam: General: NAD, lying comfortably Appear in no distress, affect appropriate Eyes: PERRLA ENT: Oral Mucosa Clear, moist  Neck: no JVD,   Cardiovascular: S1 and S2 Present, no Murmur,  Respiratory: good respiratory effort, Bilateral Air entry equal and Decreased, no Crackles, no wheezes Abdomen: Bowel Sound present, Soft and no tenderness,  Skin: no rashes Extremities: No pedal edema, resolved after diuresis Neurologic: Bilateral lower extremity stiffness most likely due to Parkinson disease, no any other focal deficits.   Gait not checked due to patient safety concerns  Vitals:   09/21/24 1547 09/21/24 1924 09/22/24 0346 09/22/24 0741  BP: 113/71 104/60 120/67 (!) 102/57  Pulse: 80 70 65 66  Resp: 16 18 18 17   Temp: 97.9 F (36.6 C) 97.6 F (36.4 C) 98.3 F (36.8 C) 98.6 F (37 C)  TempSrc:  Oral  Oral  SpO2: 100% 98% 100% 96%  Weight:      Height:        Intake/Output Summary (Last 24 hours) at 09/22/2024 1341 Last data filed at 09/22/2024 0900 Gross per 24 hour  Intake 840 ml  Output --  Net 840 ml   Filed Weights   09/19/24 0500 09/20/24 0500 09/21/24 0500  Weight: 65.8 kg 62.2 kg 58.1 kg    Data Reviewed: I have personally reviewed and interpreted daily labs, tele strips, imagings as discussed above. I reviewed all nursing notes, pharmacy notes, vitals, pertinent old records I have discussed plan of care as described above with RN and patient/family.  CBC: Recent Labs  Lab 09/16/24 1758 09/17/24 0429 09/18/24 0528 09/19/24 1031  WBC 5.1 4.3 4.7 4.2  HGB 11.0* 10.3* 10.8* 12.2  HCT 31.8* 28.6* 29.4* 36.9  MCV 94.4 94.1 95.5 89.6  PLT 173 173 167 178   Basic Metabolic Panel: Recent Labs  Lab 09/16/24 1758 09/16/24 1953 09/17/24 0428 09/17/24 0429 09/18/24 0528 09/19/24 1031 09/20/24 0545  NA 139  --   --  141 138 140 142  K 3.9  --   --  3.3* 4.2 3.7 3.7  CL 104  --   --  104 101 98 100  CO2 25  --   --  28 28 32 33*  GLUCOSE 71  --   --  106* 82 83 85  BUN 17  --   --  18 16 19 22   CREATININE 0.66  --   --  0.80 0.67 0.77 0.73  CALCIUM 9.3  --   --  9.3 9.3 10.0 9.8  MG  --  2.0   --   --  2.0  --   --   PHOS  --   --  3.7  --  3.2  --   --     Studies: No results found.    Scheduled Meds:  acetaminophen   650 mg Oral TID   carbidopa -levodopa   1.5 tablet Oral TID   heparin   5,000 Units Subcutaneous Q8H   hydrocerin   Topical BID   methocarbamol   500 mg Oral TID   pantoprazole   40 mg Oral Daily   pyridOXINE   100 mg Oral Daily   sodium chloride  flush  3 mL Intravenous Q12H   traMADol   50 mg Oral  Q6H   vitamin E   400 Units Oral Daily   Continuous Infusions:   PRN Meds: ondansetron  **OR** ondansetron  (ZOFRAN ) IV, senna-docusate, traMADol   Time spent: 55 minutes  Author: ELVAN SOR. MD [facility] 09/22/2024 1:41 PM  To reach On-call, see care teams to locate the attending and reach out to them via [email or web address]. If 7PM-7AM, please contact night-coverage If you still have difficulty reaching the attending provider, please page the [facility] (Director on Call) for [facility] on amion for assistance.

## 2024-09-22 NOTE — Progress Notes (Signed)
 Physical Therapy Treatment Patient Details Name: Michele Brewer MRN: [id number] DOB: 07/22/1951 Today's Date: 09/22/2024   History of Present Illness Pt is a 74 y.o. year old female with medical history of Parkinson's disease, MDD presenting to the ED with worsening lower extremity edema making ambulation difficult.  MD assessment includes: FTT, LE weakness, LE edema, and UTI.    PT Comments  Pt in bed.  Reports some nausea today but stated it would not limit her ability to participate.  She is able to get in/out of bed with bed features and cga x 1.  She needs min a x 1 to stand from bed this am after failed attempt on her own.  She is able to walk into bathroom then complete loop on unit with slow gait.  Decreased step length and height remains.   When questioned she stated she is not at her baseline and feels she is shuffling her feet.  Stated LLE remains difficult to lift/advance due to edema and general weakness.  She remains highly motivated to improve mobility and return home but agrees she is not at baseline and family at home is not able to provide physical assist as they are both also on walkers.  < 3 hrs a day remains appropriate for discharge.    If plan is discharge home, recommend the following: A little help with walking and/or transfers;A little help with bathing/dressing/bathroom;Assistance with cooking/housework;Assist for transportation;Help with stairs or ramp for entrance   Can travel by private vehicle        Equipment Recommendations       Recommendations for Other Services       Precautions / Restrictions Precautions Precautions: Fall Recall of Precautions/Restrictions: Intact Restrictions Weight Bearing Restrictions Per Provider Order: No     Mobility  Bed Mobility Overal bed mobility: Needs Assistance Bed Mobility: Supine to Sit, Sit to Supine     Supine to sit: Supervision, Used rails Sit to supine: Supervision, Used rails     Patient Response:  Cooperative  Transfers Overall transfer level: Needs assistance Equipment used: Rolling walker (2 wheels) Transfers: Sit to/from Stand Sit to Stand: Min assist                Ambulation/Gait Ambulation/Gait assistance: Editor, Commissioning (Feet): 200 Feet Assistive device: Rolling walker (2 wheels) Gait Pattern/deviations: Trunk flexed, Step-through pattern, Decreased step length - right, Decreased step length - left, Shuffle Gait velocity: decreased     General Gait Details: slow gait with dec step height and length.  pt not at baseline amb   Optometrist     Tilt Bed Tilt Bed Patient Response: Cooperative  Modified Rankin (Stroke Patients Only)       Balance Overall balance assessment: Needs assistance Sitting-balance support: No upper extremity supported, Feet supported Sitting balance-Leahy Scale: Good     Standing balance support: Bilateral upper extremity supported, Reliant on assistive device for balance, During functional activity Standing balance-Leahy Scale: Fair Standing balance comment: able to manage clothing in bathroom today without UE supports                            Communication Communication Communication: No apparent difficulties Factors Affecting Communication: Reduced clarity of speech  Cognition Arousal: Alert Behavior During Therapy: Flat affect, WFL for tasks assessed/performed   PT - Cognitive impairments: No apparent impairments  PT - Cognition Comments: remains highly motivated with mobility Following commands: Intact Following commands impaired: Follows one step commands with increased time (inc time due to Parkinsons not cognition)    Cueing Cueing Techniques: Verbal cues, Tactile cues, Visual cues  Exercises      General Comments        Pertinent Vitals/Pain Pain Assessment Pain Assessment: Faces Faces Pain Scale: Hurts a little  bit Pain Location: L knee - when questioned she stated she has random pains that travel day to day. Pain Descriptors / Indicators: Sore Pain Intervention(s): Limited activity within patient's tolerance, Monitored during session, Repositioned    Home Living                          Prior Function            PT Goals (current goals can now be found in the care plan section) Progress towards PT goals: Progressing toward goals    Frequency    Min 2X/week      PT Plan      Co-evaluation              AM-PAC PT 6 Clicks Mobility   Outcome Measure  Help needed turning from your back to your side while in a flat bed without using bedrails?: A Little Help needed moving from lying on your back to sitting on the side of a flat bed without using bedrails?: A Little Help needed moving to and from a bed to a chair (including a wheelchair)?: A Little Help needed standing up from a chair using your arms (e.g., wheelchair or bedside chair)?: A Little Help needed to walk in hospital room?: A Little Help needed climbing 3-5 steps with a railing? : A Lot 6 Click Score: 17    End of Session Equipment Utilized During Treatment: Gait belt Activity Tolerance: Patient tolerated treatment well Patient left: in bed;with bed alarm set;with call bell/phone within reach;with family/visitor present Nurse Communication: Mobility status PT Visit Diagnosis: Unsteadiness on feet (R26.81);History of falling (Z91.81);Difficulty in walking, not elsewhere classified (R26.2);Muscle weakness (generalized) (M62.81)     Time: 8992-8968 PT Time Calculation (min) (ACUTE ONLY): 24 min  Charges:    $Gait Training: 8-22 mins $Therapeutic Activity: 8-22 mins PT General Charges $$ ACUTE PT VISIT: 1 Visit                   Lauraine Gills, PTA 09/22/2024, 10:55 AM

## 2024-09-22 NOTE — TOC Progression Note (Addendum)
 Transition of Care [facility]) - Progression Note    Patient Details  Name: Michele Brewer MRN: [id number] Date of Birth: Feb 11, 1951  [facility]) CM/SW Contact  Dalia GORMAN Fuse, RN Phone Number: 09/22/2024, 9:14 AM  Clinical Narrative:     TOC spoke with Tammy at [facility] to start ins auth for [facility] and [facility] transport. [facility] will call when they have a decision.  [employer] called and would like to offer a P2P. MD to call Dr. Janit [phone] before noon tomorrow.  Expected Discharge Plan: Skilled Nursing Facility Barriers to Discharge: Continued Medical Work up               Expected Discharge Plan and Services   Discharge Planning Services: CM Consult Post Acute Care Choice: Skilled Nursing Facility Living arrangements for the past 2 months: Single Family Home                 DME Arranged: N/A         HH Arranged: NA           Social Drivers of Health (SDOH) Interventions SDOH Screenings   Food Insecurity: Unknown (09/17/2024)  Housing: Low Risk (09/17/2024)  Transportation Needs: No Transportation Needs (09/17/2024)  Utilities: Not At Risk (09/17/2024)  Financial Resource Strain: Low Risk  (06/12/2024)   Received from [facility]  Social Connections: Moderately Integrated (09/17/2024)  Recent Concern: Social Connections - Moderately Isolated (08/18/2024)  Tobacco Use: Low Risk (09/16/2024)    Readmission Risk Interventions     No data to display

## 2024-09-22 NOTE — Care Management Important Message (Signed)
 Important Message  Patient Details  Name: Michele Brewer MRN: [id number] Date of Birth: [birth year]   Important Message Given:  Yes - Medicare IM     Berish Bohman W, CMA 09/22/2024, 12:43 PM

## 2024-09-23 ENCOUNTER — Telehealth (INDEPENDENT_AMBULATORY_CARE_PROVIDER_SITE_OTHER): Payer: Self-pay

## 2024-09-23 LAB — URIC ACID: Uric Acid, Serum: 5.1 mg/dL (ref 2.5–7.1)

## 2024-09-23 LAB — GLUCOSE, CAPILLARY: Glucose-Capillary: 100 mg/dL — ABNORMAL HIGH (ref 70–99)

## 2024-09-23 NOTE — Plan of Care (Signed)

## 2024-09-23 NOTE — Progress Notes (Addendum)
 [facility] Progress Note  Patient: Michele Brewer    FMW:[id number]  DOA: 09/16/2024     Date of Service: the patient was seen and examined on 09/23/2024  Chief Complaint  Patient presents with   Leg Swelling   Brief hospital course:  Michele Brewer is a 74 y.o. year old female with medical history of Parkinson's disease, MDD presenting to the ED with worsening lower extremity edema making ambulation difficult. States it has been worsening for the last 6 weeks. She states it was abrupt weakness in her legs. States swelling and weakness occurred together. She was able to do her ADLs and iADLs prior to this but has been unable to to them.   ED workup: HDS stable.  Labs: CBC without leukocytosis, mild anemia at baseline.  BMP unremarkable. UA with signs of infection but no symptoms.  Urine culture pending. ProBNP elevated at 856.  CXR with low lung volumes but otherwise clear. Given pt's edema making it difficult for patient to ambulate,  [facility] contacted for admission.    Assessment and Plan:  # Parkinson disease, presented with lower extremity stiffness and difficulty ambulation S/p Cogentin  1 mg p.o. BID, dc/'d as per neuro Continue fall precaution PT and OT eval done, recommend SNF placement 1/2 neurology consulted, Rec MRI C-T spine. No spina compression, multilevel C-spine degenerative changes, no acute T-spine changes. Neurology recommended to continue vit E and B6, increased Sinemet  1.5 tablet p.o. 3 times a day. 1/3 started prednisone  40 mg p.o. daily x 3 days and  1/3 Robaxin  500 mg p.o. 3 times daily  PPI for GI prophylaxis 1/4 patient is feeling little bit improvement, still complaining of pain in bilateral legs extending to her hips. Started Tylenol  650 mg p.o. 3 times daily scheduled and tramadol  12.5 mg p.o. every 6 hourly as needed 1/5 Tramadol  50 mg po q6 x 2 doses scheduled  1/6 to follow uric acid level to rule out gout  # Elevated CK level: improved CK  699>>411 It could be secondary to immobility. Cannot give IV fluid because of lower extremity edema, patient is getting Lasix  for diuresis   # Lower extremity edema could be HFpEF 1/4 edema resolved, s/p IV Lasix  proBNP elevated TTE shows LVEF 60 to 65% S/p Lasix  40 mg IV twice daily x 2 days, followed by 40 mg IV daily x 2 days Monitor urine output and renal function stat Venous duplex negative for DVT   # Pyuria, UTI ruled out  UA positive, s/p ceftriaxone  1 g IV daily x 3 days urine culture growing multiple species, suggested reflex   # Urinary retention Bladder scan 286, Foley catheter was inserted and 325 mL urine was collected on 12/31 1/2 discontinued Foley, patient is voiding well   # Vitamin E  and vitamin B6 deficiency Started vitamin E  and vitamin B6 oral supplement B12 and folate within normal range  # Hypokalemia, potassium repleted. Monitor electrolytes and replete as needed.   Body mass index is 23.8 kg/m (pended).  Interventions:  Diet: Regular diet, fluid restriction 1.5 L/day DVT Prophylaxis: Subcutaneous Heparin     Advance goals of care discussion: DNR-intermittent  Family Communication: family was not present at bedside, at the time of interview.  The pt provided permission to discuss medical plan with the family. Opportunity was given to ask question and all questions were answered satisfactorily.   Disposition:  Pt is from home, admitted with lower extremity weakness due to Parkinson. Stable to DC now Discharge to SNF, when bed  will be available. Follow TOC for dispo plan 1/6 peer-to-peer was done but it was denied, TOC is looking for LTC versus discharge home  Subjective: No significant events overnight.  Patient feels significant improvement in the bilateral lower To movement, she is now able to walk with minimal assistance.  Patient thinks that Robaxin  is helping Still has pain in the bilateral feet, order placed for uric acid level to rule  out gout    Physical Exam: General: NAD, lying comfortably Appear in no distress, affect appropriate Eyes: PERRLA ENT: Oral Mucosa Clear, moist  Neck: no JVD,  Cardiovascular: S1 and S2 Present, no Murmur,  Respiratory: good respiratory effort, Bilateral Air entry equal and Decreased, no Crackles, no wheezes Abdomen: Bowel Sound present, Soft and no tenderness,  Skin: no rashes Extremities: mild pedal edema, improved after diuresis Neurologic: Bilateral lower extremity stiffness improving, no any other focal deficits.   Gait not checked due to patient safety concerns  Vitals:   09/23/24 0507 09/23/24 0828 09/23/24 1541 09/23/24 1557  BP: 117/63 105/61 110/68 108/63  Pulse: (!) 59 66 71 72  Resp: 16 16 14 16   Temp: 97.7 F (36.5 C) 98.7 F (37.1 C)  97.8 F (36.6 C)  TempSrc:  Oral    SpO2: 99% 96% 100% 100%  Weight: 62.9 kg     Height:       No intake or output data in the 24 hours ending 09/23/24 1616  Filed Weights   09/20/24 0500 09/21/24 0500 09/23/24 0507  Weight: 62.2 kg 58.1 kg 62.9 kg    Data Reviewed: I have personally reviewed and interpreted daily labs, tele strips, imagings as discussed above. I reviewed all nursing notes, pharmacy notes, vitals, pertinent old records I have discussed plan of care as described above with RN and patient/family.  CBC: Recent Labs  Lab 09/16/24 1758 09/17/24 0429 09/18/24 0528 09/19/24 1031  WBC 5.1 4.3 4.7 4.2  HGB 11.0* 10.3* 10.8* 12.2  HCT 31.8* 28.6* 29.4* 36.9  MCV 94.4 94.1 95.5 89.6  PLT 173 173 167 178   Basic Metabolic Panel: Recent Labs  Lab 09/16/24 1758 09/16/24 1953 09/17/24 0428 09/17/24 0429 09/18/24 0528 09/19/24 1031 09/20/24 0545  NA 139  --   --  141 138 140 142  K 3.9  --   --  3.3* 4.2 3.7 3.7  CL 104  --   --  104 101 98 100  CO2 25  --   --  28 28 32 33*  GLUCOSE 71  --   --  106* 82 83 85  BUN 17  --   --  18 16 19 22   CREATININE 0.66  --   --  0.80 0.67 0.77 0.73  CALCIUM 9.3   --   --  9.3 9.3 10.0 9.8  MG  --  2.0  --   --  2.0  --   --   PHOS  --   --  3.7  --  3.2  --   --     Studies: No results found.    Scheduled Meds:  acetaminophen   650 mg Oral TID   carbidopa -levodopa   1.5 tablet Oral TID   heparin   5,000 Units Subcutaneous Q8H   hydrocerin   Topical BID   methocarbamol   500 mg Oral TID   pantoprazole   40 mg Oral Daily   pyridOXINE   100 mg Oral Daily   sodium chloride  flush  3 mL Intravenous Q12H   vitamin  E  400 Units Oral Daily   Continuous Infusions:   PRN Meds: ondansetron  **OR** ondansetron  (ZOFRAN ) IV, senna-docusate, traMADol   Time spent: 40 minutes  Author: ELVAN SOR. MD [facility] 09/23/2024 4:16 PM  To reach On-call, see care teams to locate the attending and reach out to them via [email or web address]. If 7PM-7AM, please contact night-coverage If you still have difficulty reaching the attending provider, please page the [facility] (Director on Call) for [facility] on amion for assistance.

## 2024-09-23 NOTE — TOC Progression Note (Signed)
 Transition of Care [facility]) - Progression Note    Patient Details  Name: Michele Brewer MRN: [id number] Date of Birth: April 14, 1951  Transition [facility] Care [facility]) CM/SW Contact  Dalia GORMAN Fuse, RN Phone Number: 09/23/2024, 9:36 AM  Clinical Narrative:    Patient with a STR bed at [facility]. P2P with [facility] is pending, the deadline is noon today.  DC disposition is SNF vs home w/ max HH services   Expected Discharge Plan: Skilled Nursing Facility Barriers to Discharge: Continued Medical Work up               Expected Discharge Plan and Services   Discharge Planning Services: CM Consult Post Acute Care Choice: Skilled Nursing Facility Living arrangements for the past 2 months: Single Family Home                 DME Arranged: N/A         HH Arranged: NA           Social Drivers of Health (SDOH) Interventions SDOH Screenings   Food Insecurity: Unknown (09/17/2024)  Housing: Low Risk (09/17/2024)  Transportation Needs: No Transportation Needs (09/17/2024)  Utilities: Not At Risk (09/17/2024)  Financial Resource Strain: Low Risk  (06/12/2024)   Received from [facility]  Social Connections: Moderately Integrated (09/17/2024)  Recent Concern: Social Connections - Moderately Isolated (08/18/2024)  Tobacco Use: Low Risk (09/16/2024)    Readmission Risk Interventions     No data to display

## 2024-09-23 NOTE — Progress Notes (Signed)
 Occupational Therapy Treatment Patient Details Name: Michele Brewer MRN: [id number] DOB: April 05, 1951 Today's Date: 09/23/2024   History of present illness Pt is a 74 y.o. year old female with medical history of Parkinson's disease, MDD presenting to the ED with worsening lower extremity edema making ambulation difficult.  MD assessment includes: FTT, LE weakness, LE edema, and UTI.   OT comments  Pt seen for OT tx. Pt in recliner, endorsing feeling a bit better but still having mild constant nausea, rates it 2/10. Pt agreeable to session. Pt required SBA for ADL transfers from recliner with increased time/effort to power up and initial posterior bias, improving with VC for anterior weight shift. Pt amb ~160' with 2 standing rest breaks, slow, PRN VC for improving PD-like shuffling, edu in activity pacing, work simplification for ADL and IADL to maximize safety and minimize over exertion. Pt verbalized understanding. Continues to benefit from skilled OT services to maximize safety/indep.       If plan is discharge home, recommend the following:  A little help with walking and/or transfers;A little help with bathing/dressing/bathroom;Assist for transportation   Equipment Recommendations  Other (comment) (defer)    Recommendations for Other Services      Precautions / Restrictions Precautions Precautions: Fall Recall of Precautions/Restrictions: Intact Restrictions Weight Bearing Restrictions Per Provider Order: No       Mobility Bed Mobility               General bed mobility comments: NT, in recliner pre and post session    Transfers Overall transfer level: Needs assistance Equipment used: Rolling walker (2 wheels) Transfers: Sit to/from Stand Sit to Stand: Contact guard assist           General transfer comment: inc time     Balance Overall balance assessment: Needs assistance Sitting-balance support: No upper extremity supported, Feet supported Sitting  balance-Leahy Scale: Good     Standing balance support: No upper extremity supported, During functional activity Standing balance-Leahy Scale: Fair                             ADL either performed or assessed with clinical judgement   ADL Overall ADL's : Needs assistance/impaired     Grooming: Standing;Supervision/safety;Wash/dry face;Oral care                                      Extremity/Trunk Assessment              Vision       Perception     Praxis     Communication     Cognition Arousal: Alert Behavior During Therapy: WFL for tasks assessed/performed Cognition: No apparent impairments                               Following commands: Intact Following commands impaired: Follows one step commands with increased time      Cueing      Exercises Other Exercises Other Exercises: Pt amb ~160' with 2 standing rest breaks, slow, PRN VC for improving PD-like shuffling, edu in activity pacing, work simplification for ADL and IADL to maximize safety and minimize over exertion    Shoulder Instructions       General Comments      Pertinent Vitals/ Pain       Pain  Assessment Pain Assessment: No/denies pain  Home Living                                          Prior Functioning/Environment              Frequency  Min 2X/week        Progress Toward Goals  OT Goals(current goals can now be found in the care plan section)  Progress towards OT goals: Progressing toward goals  Acute Rehab OT Goals Patient Stated Goal: get stronger OT Goal Formulation: With patient Time For Goal Achievement: 10/02/24 Potential to Achieve Goals: Good  Plan      Co-evaluation                 AM-PAC OT 6 Clicks Daily Activity     Outcome Measure   Help from another person eating meals?: None Help from another person taking care of personal grooming?: A Little Help from another person  toileting, which includes using toliet, bedpan, or urinal?: A Little Help from another person bathing (including washing, rinsing, drying)?: A Little Help from another person to put on and taking off regular upper body clothing?: A Little Help from another person to put on and taking off regular lower body clothing?: A Little 6 Click Score: 19    End of Session Equipment Utilized During Treatment: Rolling walker (2 wheels);Gait belt  OT Visit Diagnosis: Other abnormalities of gait and mobility (R26.89);Muscle weakness (generalized) (M62.81)   Activity Tolerance Patient tolerated treatment well   Patient Left in chair;with call bell/phone within reach;with chair alarm set   Nurse Communication          Time: 1341-1401 OT Time Calculation (min): 20 min  Charges: OT General Charges $OT Visit: 1 Visit OT Treatments $Therapeutic Activity: 8-22 mins  Warren SAUNDERS., MPH, MS, OTR/L ascom [phone] 09/23/2024, 2:35 PM

## 2024-09-23 NOTE — Progress Notes (Signed)
 Mobility Specialist - Progress Note    09/23/24 1054  Mobility  Activity Ambulated with assistance;Stood at bedside  Level of Assistance Contact guard assist, steadying assist  Assistive Device Front wheel walker  Distance Ambulated (ft) 200 ft  Range of Motion/Exercises Active  Activity Response Tolerated well  Mobility Referral Yes  Mobility visit 1 Mobility   Pt resting in bed on RA upon entry. Pt STS and ambulates to hallway around NS CGA with RW and chair follow for safety. Pt had a brief bout of dizziness but said it subsided as the walk progressed. Pt returned to recliner and left with needs in reach. Chair alarm activated.   Guido Rumble Mobility Specialist 09/23/2024, 10:56 AM

## 2024-09-23 NOTE — Telephone Encounter (Signed)
 Michele Brewer from [facility] home health called at this time in reference to a fax she sent over 09/10/24. I called Michele Brewer back at this time and left a voicemail letting her know the provider had signed the fax at this time and it had been sent back over, is she could call to confirm she received the fax. Sent over 09/22/24 @ 1536. Left voicemail for Michele Brewer yesterday 09/22/24 after sending over fax and again this morning 09/23/24 @1038A .

## 2024-09-23 NOTE — Plan of Care (Signed)
  Problem: Health Behavior/Discharge Planning: Goal: Ability to manage health-related needs will improve Outcome: Progressing   Problem: Clinical Measurements: Goal: Will remain free from infection Outcome: Progressing Goal: Cardiovascular complication will be avoided Outcome: Progressing   Problem: Elimination: Goal: Will not experience complications related to bowel motility Outcome: Progressing

## 2024-09-23 NOTE — TOC Progression Note (Signed)
 Transition of Care [facility]) - Progression Note    Patient Details  Name: Chava Dulac MRN: [id number] Date of Birth: March 15, 1951  Transition of Care [facility]) CM/SW Contact  Dalia GORMAN Fuse, RN Phone Number: 09/23/2024, 3:42 PM  Clinical Narrative:     TOC spoke with Dr. Janit the Medical Director for [facility]. He noticed the member has had multiple readmissions and that PT/OT continues to recommend STR. Documentation shows the patient is ambulating progressively further than she was prior to rehab. The patient's needs are more custodial in nature. The patient left rehab before at day 26, co-pay days start on day 27. Request for SNF will be denied at this time. TOC to speak with the patient to offer referral to finance to see if she is appropriate for LTC Medicaid. The patient has multiple readmissions, TOC to speak with the patient about LTC.  Expected Discharge Plan: Skilled Nursing Facility Barriers to Discharge: Continued Medical Work up               Expected Discharge Plan and Services   Discharge Planning Services: CM Consult Post Acute Care Choice: Skilled Nursing Facility Living arrangements for the past 2 months: Single Family Home                 DME Arranged: N/A         HH Arranged: NA           Social Drivers of Health (SDOH) Interventions SDOH Screenings   Food Insecurity: Unknown (09/17/2024)  Housing: Low Risk (09/17/2024)  Transportation Needs: No Transportation Needs (09/17/2024)  Utilities: Not At Risk (09/17/2024)  Financial Resource Strain: Low Risk  (06/12/2024)   Received from [facility]  Social Connections: Moderately Integrated (09/17/2024)  Recent Concern: Social Connections - Moderately Isolated (08/18/2024)  Tobacco Use: Low Risk (09/16/2024)    Readmission Risk Interventions     No data to display

## 2024-09-24 LAB — GLUCOSE, CAPILLARY: Glucose-Capillary: 85 mg/dL (ref 70–99)

## 2024-09-24 MED ORDER — IRON SUCROSE 300 MG IVPB - SIMPLE MED
300.0000 mg | Freq: Once | Status: AC
  Administered 2024-09-24: 300 mg via INTRAVENOUS
  Filled 2024-09-24: qty 300

## 2024-09-24 MED ORDER — KATE FARMS STANDARD 1.4 PO LIQD
325.0000 mL | Freq: Two times a day (BID) | ORAL | Status: DC
  Administered 2024-09-24: 325 mL via ORAL
  Filled 2024-09-24: qty 325

## 2024-09-24 MED ORDER — ADULT MULTIVITAMIN W/MINERALS CH
1.0000 | ORAL_TABLET | Freq: Every day | ORAL | Status: DC
  Administered 2024-09-24 – 2024-09-25 (×2): 1 via ORAL
  Filled 2024-09-24 (×2): qty 1

## 2024-09-24 MED ORDER — FERROUS SULFATE 325 (65 FE) MG PO TABS
325.0000 mg | ORAL_TABLET | Freq: Every day | ORAL | Status: DC
  Administered 2024-09-25: 325 mg via ORAL
  Filled 2024-09-24: qty 1

## 2024-09-24 MED ORDER — FERROUS SULFATE 325 (65 FE) MG PO TBEC: 325.0000 mg | DELAYED_RELEASE_TABLET | Freq: Every day | ORAL | Status: DC

## 2024-09-24 MED ORDER — POLYETHYLENE GLYCOL 3350 17 G PO PACK
17.0000 g | PACK | Freq: Every day | ORAL | Status: DC
  Administered 2024-09-24: 17 g via ORAL
  Filled 2024-09-24 (×2): qty 1

## 2024-09-24 MED ORDER — SENNOSIDES-DOCUSATE SODIUM 8.6-50 MG PO TABS
1.0000 | ORAL_TABLET | Freq: Every day | ORAL | Status: DC
  Administered 2024-09-24: 1 via ORAL
  Filled 2024-09-24: qty 1

## 2024-09-24 MED ORDER — ENSURE PLUS HIGH PROTEIN PO LIQD: 237.0000 mL | Freq: Two times a day (BID) | ORAL | Status: DC

## 2024-09-24 NOTE — Progress Notes (Signed)
 Initial Nutrition Assessment  DOCUMENTATION CODES:   Non-severe (moderate) malnutrition in context of chronic illness  INTERVENTION:   -MVI with minerals daily -Continue regular diet -D/c Ensure Plus High Protein po BID, each supplement provides 350 kcal and 20 grams of protein  -Kate Farms 1.4 PO BID, each supplement provides 455 kcal and 20 grams protein.  -Continue 100 mg vitamin B 6 daily -Continue 400 units vitamin E  daily  NUTRITION DIAGNOSIS:   Moderate Malnutrition related to chronic illness (Parkinson's disease) as evidenced by mild fat depletion, moderate fat depletion, mild muscle depletion, moderate muscle depletion.  GOAL:   Patient will meet greater than or equal to 90% of their needs  MONITOR:   PO intake, Supplement acceptance  REASON FOR ASSESSMENT:   Consult Assessment of nutrition requirement/status  ASSESSMENT:   74 y.o. year old female with medical history of Parkinson's disease, MDD presenting to the ED with worsening lower extremity edema making ambulation difficult. States it has been worsening for the last 6 weeks. She states it was abrupt weakness in her legs. States swelling and weakness occurred together. She was able to do her ADLs and iADLs prior to this but has been unable to to them  Patient admitted with Parkinson's disease, presenting with lower extremity stiffness and difficult ambulation.   Reviewed I/O's: +120 ml x 24 hours and -5.7 L since admission   Patient familiar to this RD, due to recent prior admission.   Patient sitting up in recliner chair at time of visit. She reports feeling well and remembers RD from prior admission. Patient is a selective eater at baseline, but reports that she has been able to find foods that she likes during admission. She ate 100% of lunch, but does not remember what she had. Noted meal completions 40-100%.   Patient reports good appetite PTA. SHe usually consumes 2 meals per day (Breakfast cereal and  fruit; Dinner: chicken and vegetables).   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 She also does not eat beef, which is a preference of hers. She does not want to drink Ensure, as she fears she will not tolerate it, even after being assured it is lactose free. She does not remember drinking Mallie Pinion last admission, but is willing to try.   Patient denies any weight loss. Reviewed weight over the past 4 months (63.5-65.8 kg).   Discussed importance of good meal and supplement intake to promote healing.   Medications reviewed and include sinmet, ferrous sulfate , miralax , senokot, and vitamin E .   Per TOC notes, plan to discharge home with [facility] services. Insurance request for SNF was denied.   Labs reviewed: CBGS: 85-123. Vitamin B6: 3.0. Vitamin B1, copper , vitamin D , vitamin C , and CRP  WDL, Vitamin E : 7.0. Noted labs ( thiamine , biotin, vitamin E ,  manganese, vitamin C , CRP drawn during last admission last month). Vitamin B6 and vitamin E  currently being supplemented.   NUTRITION - FOCUSED PHYSICAL EXAM:  Flowsheet Row Most Recent Value  Orbital Region Mild depletion  Upper Arm Region Mild depletion  Thoracic and Lumbar Region No depletion  Buccal Region Mild depletion  Temple Region Moderate depletion  Clavicle Bone Region Moderate depletion  Clavicle and Acromion Bone Region Moderate depletion  Scapular Bone Region Moderate depletion  Dorsal Hand Mild depletion  Patellar Region No depletion  Anterior Thigh Region No depletion  Posterior Calf Region No depletion  Edema (RD Assessment) Mild  Hair Reviewed  Eyes Reviewed  Mouth Reviewed  Skin Reviewed  Nails Reviewed    Diet Order:   Diet Order             Diet regular Room service appropriate? Yes; Fluid consistency: Thin; Fluid restriction: 2000 mL Fluid  Diet effective now                    EDUCATION NEEDS:   Education needs have been addressed  Skin:  Skin Assessment: Reviewed RN Assessment  Last BM:  09/24/24  Height:   Ht Readings from Last 1 Encounters:  09/17/24 (P) 5' 4 (1.626 m)    Weight:   Wt Readings from Last 1 Encounters:  09/24/24 65 kg    Ideal Body Weight:  54.5 kg  BMI:  Body mass index is 24.6 kg/m (pended).  Estimated Nutritional Needs:   Kcal:  1750-1950  Protein:  90-105 grams  Fluid:  1.7-1.9 L    Margery ORN, RD, LDN, CDCES Registered Dietitian III Certified Diabetes Care and Education Specialist If unable to reach this RD, please use RD Inpatient group chat on secure chat between hours of 8am-4 pm daily

## 2024-09-24 NOTE — Progress Notes (Signed)
 Physical Therapy Treatment Patient Details Name: Michele Brewer MRN: [id number] DOB: 07/08/1951 Today's Date: 09/24/2024   History of Present Illness Pt is a 74 y.o. year old female with medical history of Parkinson's disease, MDD presenting to the ED with worsening lower extremity edema making ambulation difficult.  MD assessment includes: FTT, LE weakness, LE edema, and UTI.    PT Comments  Patient received up in recliner, friends in room visiting. Patient is agreeable to PT session. She requires supervision for sit to stand from recliner and supervision for ambulation of 200 feet with RW. No lob, slow pace with decreased foot clearance bilaterally. Patient will continue to benefit from skilled PT to improve safety and strength.     If plan is discharge home, recommend the following: A little help with walking and/or transfers;A little help with bathing/dressing/bathroom;Assistance with cooking/housework;Assist for transportation;Help with stairs or ramp for entrance   Can travel by private vehicle     Yes  Equipment Recommendations  None recommended by PT    Recommendations for Other Services       Precautions / Restrictions Precautions Precautions: Fall Recall of Precautions/Restrictions: Intact Restrictions Weight Bearing Restrictions Per Provider Order: No     Mobility  Bed Mobility               General bed mobility comments: NT, in recliner pre and post session    Transfers Overall transfer level: Needs assistance Equipment used: Rolling walker (2 wheels) Transfers: Sit to/from Stand Sit to Stand: Supervision                Ambulation/Gait Ambulation/Gait assistance: Supervision Gait Distance (Feet): 200 Feet Assistive device: Rolling walker (2 wheels) Gait Pattern/deviations: Step-through pattern, Decreased step length - right, Decreased step length - left, Decreased stride length, Shuffle, Decreased dorsiflexion - right, Decreased dorsiflexion -  left Gait velocity: decreased     General Gait Details: slow, steady ambulation, decreased B foot clearance   Stairs             Wheelchair Mobility     Tilt Bed    Modified Rankin (Stroke Patients Only)       Balance Overall balance assessment: Modified Independent Sitting-balance support: Feet supported Sitting balance-Leahy Scale: Normal     Standing balance support: Bilateral upper extremity supported, During functional activity, Reliant on assistive device for balance Standing balance-Leahy Scale: Good Standing balance comment: no LOB with ambulation using AD                            Communication Communication Communication: No apparent difficulties  Cognition Arousal: Alert Behavior During Therapy: WFL for tasks assessed/performed   PT - Cognitive impairments: No apparent impairments                         Following commands: Intact      Cueing Cueing Techniques: Verbal cues  Exercises      General Comments        Pertinent Vitals/Pain Pain Assessment Pain Assessment: No/denies pain    Home Living                          Prior Function            PT Goals (current goals can now be found in the care plan section) Acute Rehab PT Goals Patient Stated Goal: To get stronger PT Goal  Formulation: With patient Time For Goal Achievement: 09/30/24 Potential to Achieve Goals: Good Progress towards PT goals: Progressing toward goals    Frequency    Min 2X/week      PT Plan      Co-evaluation              AM-PAC PT 6 Clicks Mobility   Outcome Measure        Help needed standing up from a chair using your arms (e.g., wheelchair or bedside chair)?: None Help needed to walk in hospital room?: A Little Help needed climbing 3-5 steps with a railing? : A Lot 6 Click Score: 9    End of Session   Activity Tolerance: Patient tolerated treatment well Patient left: in chair;with call  bell/phone within reach;with chair alarm set Nurse Communication: Mobility status PT Visit Diagnosis: Muscle weakness (generalized) (M62.81)     Time: 1130-1144 PT Time Calculation (min) (ACUTE ONLY): 14 min  Charges:    $Gait Training: 8-22 mins PT General Charges $$ ACUTE PT VISIT: 1 Visit                     Burdette Gergely, PT, GCS 09/24/2024,11:56 AM

## 2024-09-24 NOTE — TOC Progression Note (Addendum)
 Transition of Care [facility]) - Progression Note    Patient Details  Name: Michele Brewer MRN: [id number] Date of Birth: 27-Dec-1950  [facility] CM/SW Contact  Dalia GORMAN Fuse, RN Phone Number: 09/24/2024, 11:13 AM  Clinical Narrative:    TOC spoke with the patient in the room and advised of the SNF denial. The patient lives with her sister and mother, but it doesn't sound like her sister assists with her care. The patient advised that her mother is actually the boss and overseer of the home. Her mother requires minimal assistance.   TOC spoke with the patient about screening for LTC Medicaid in case she needs a LTC facility in the future. The patient is in agreement with the screening. Referral sent to Vernell Gully with fianance.  The patient would like to discharge to home with [facility] services (PT/OT/RN/Aide/SW). She is active with [facility]. Referral sent to [facility] in the hub. 13:57 [facility] did not respond. Hedda was the first to respond plan for [facility] services with [facility]   Expected Discharge Plan: Skilled Nursing Facility Barriers to Discharge: Continued Medical Work up               Expected Discharge Plan and Services   Discharge Planning Services: CM Consult Post Acute Care Choice: Skilled Nursing Facility Living arrangements for the past 2 months: Single Family Home                 DME Arranged: N/A         HH Arranged: NA           Social Drivers of Health (SDOH) Interventions SDOH Screenings   Food Insecurity: Unknown (09/17/2024)  Housing: Low Risk (09/17/2024)  Transportation Needs: No Transportation Needs (09/17/2024)  Utilities: Not At Risk (09/17/2024)  Financial Resource Strain: Low Risk  (06/12/2024)   Received from [facility]  Social Connections: Moderately Integrated (09/17/2024)  Recent Concern: Social Connections - Moderately Isolated (08/18/2024)  Tobacco Use: Low Risk (09/16/2024)    Readmission Risk  Interventions     No data to display

## 2024-09-24 NOTE — Plan of Care (Signed)
°  Problem: Clinical Measurements: Goal: Will remain free from infection Outcome: Progressing   Problem: Coping: Goal: Level of anxiety will decrease Outcome: Progressing   Problem: Elimination: Goal: Will not experience complications related to bowel motility Outcome: Progressing Goal: Will not experience complications related to urinary retention Outcome: Progressing

## 2024-09-24 NOTE — Progress Notes (Signed)
 [facility] Progress Note  Patient: Michele Brewer    FMW:[id number]  DOA: 09/16/2024     Date of Service: the patient was seen and examined on 09/24/2024  Chief Complaint  Patient presents with   Leg Swelling   Brief hospital course:  Michele Brewer is a 74 y.o. year old female with medical history of Parkinson's disease, MDD presenting to the ED with worsening lower extremity edema making ambulation difficult. States it has been worsening for the last 6 weeks. She states it was abrupt weakness in her legs. States swelling and weakness occurred together. She was able to do her ADLs and iADLs prior to this but has been unable to to them.   ED workup: HDS stable.  Labs: CBC without leukocytosis, mild anemia at baseline.  BMP unremarkable. UA with signs of infection but no symptoms.  Urine culture pending. ProBNP elevated at 856.  CXR with low lung volumes but otherwise clear. Given pt's edema making it difficult for patient to ambulate,  TRH contacted for admission.    Assessment and Plan:  # Parkinson disease, presented with lower extremity stiffness and difficulty ambulation S/p Cogentin  1 mg p.o. BID, dc/'d as per neuro Continue fall precaution PT and OT eval done, recommend SNF placement 1/2 neurology consulted, Rec MRI C-T spine. No spina compression, multilevel C-spine degenerative changes, no acute T-spine changes. Neurology recommended to continue vit E and B6, increased Sinemet  1.5 tablet p.o. 3 times a day. 1/3 started prednisone  40 mg p.o. daily x 3 days and  1/3 Robaxin  500 mg p.o. 3 times daily  PPI for GI prophylaxis 1/4 patient is feeling little bit improvement, still complaining of pain in bilateral legs extending to her hips. Started Tylenol  650 mg p.o. 3 times daily scheduled and tramadol  12.5 mg p.o. every 6 hourly as needed 1/5 Tramadol  50 mg po q6 x 2 doses scheduled  1/6 uric acid 5.1 1/7 Patient reports stiffness is improved with muscle relaxant, she is  having mild sedative effect from this.   # Elevated CK level: improved CK [id number] It could be secondary to immobility. Will check CK level in the AM    # Lower extremity edema could be HFpEF 1/4 edema resolved, s/p IV Lasix  proBNP elevated TTE shows LVEF 60 to 65% S/p Lasix  40 mg IV twice daily x 2 days, followed by 40 mg IV daily x 2 days Venous duplex negative for DVT Hold lasix  for now.    # Pyuria, UTI ruled out  UA positive, s/p ceftriaxone  1 g IV daily x 3 days urine culture growing multiple species, suggested reflex   # Urinary retention Resolved  Bladder scan 286, Foley catheter was inserted and 325 mL urine was collected on 12/31 1/2 discontinued Foley, patient is voiding well  #Fe deficiency    Latest Ref Rng & Units 09/19/2024   10:31 AM 09/18/2024    5:28 AM 09/17/2024    4:29 AM  CBC  WBC 4.0 - 10.5 K/uL 4.2  4.7  4.3   Hemoglobin 12.0 - 15.0 g/dL 87.7  89.1  89.6   Hematocrit 36.0 - 46.0 % 36.9  29.4  28.6   Platelets 150 - 400 K/uL 178  167  173    Iron /TIBC/Ferritin/ %Sat    Component Value Date/Time   IRON  26 (L) 09/17/2024 0428   TIBC 298 09/17/2024 0428   IRONPCTSAT 9 (L) 09/17/2024 0428   Will given venofer , then PO # Vitamin E  and vitamin B6 deficiency Started vitamin  E and vitamin B6 oral supplement B12 and folate within normal range  # Hypokalemia, potassium replaced as needed.   Body mass index is 24.6 kg/m (pended).  Interventions:  Diet: Regular diet, fluid restriction 1.5 L/day DVT Prophylaxis: Subcutaneous Heparin     Advance goals of care discussion: DNR-intermittent  Family Communication: family was not present at bedside, at the time of interview.  The pt provided permission to discuss medical plan with the family. Opportunity was given to ask question and all questions were answered satisfactorily.   Disposition:  Pt is from home, admitted with lower extremity weakness due to Parkinson. Stable to DC now Discharge to SNF,  when bed will be available. Follow TOC for dispo plan 1/6 peer-to-peer was done but it was denied, TOC is looking for LTC versus discharge home 1/7 patient will have home health services set up and discharge home.   Subjective: No significant events overnight.  Been over a week since bowel movement. Patient feels stiffness in legs is improved with robaxin . Increase bowel regimen. Need briefs prior to increasing bowel regmen.     Physical Exam:  Constitutional: In no distress.  Cardiovascular: Normal rate, regular rhythm. No lower extremity edema  Pulmonary: Non labored breathing on room air, no wheezing or rales.   Abdominal: Soft. Non distended and non tender Musculoskeletal: LE stiffness.   Neurological: Alert and oriented to person, place, and time. Resting tremor, gait not checked.  Skin: Skin is warm and dry.     Vitals:   09/23/24 2046 09/24/24 0405 09/24/24 0458 09/24/24 0736  BP: 120/67  (!) 112/56 (!) 101/57  Pulse: 67  64 66  Resp: 17  17 16   Temp: 97.9 F (36.6 C)  97.8 F (36.6 C) 98.3 F (36.8 C)  TempSrc: Oral  Oral Oral  SpO2: 100%  100% 99%  Weight:  65 kg    Height:       No intake or output data in the 24 hours ending 09/24/24 0847  Filed Weights   09/21/24 0500 09/23/24 0507 09/24/24 0405  Weight: 58.1 kg 62.9 kg 65 kg    Data Reviewed:   CBC: Recent Labs  Lab 09/18/24 0528 09/19/24 1031  WBC 4.7 4.2  HGB 10.8* 12.2  HCT 29.4* 36.9  MCV 95.5 89.6  PLT 167 178   Basic Metabolic Panel: Recent Labs  Lab 09/18/24 0528 09/19/24 1031 09/20/24 0545  NA 138 140 142  K 4.2 3.7 3.7  CL 101 98 100  CO2 28 32 33*  GLUCOSE 82 83 85  BUN 16 19 22   CREATININE 0.67 0.77 0.73  CALCIUM 9.3 10.0 9.8  MG 2.0  --   --   PHOS 3.2  --   --     Studies: No results found.    Scheduled Meds:  acetaminophen   650 mg Oral TID   carbidopa -levodopa   1.5 tablet Oral TID   heparin   5,000 Units Subcutaneous Q8H   hydrocerin   Topical BID    methocarbamol   500 mg Oral TID   pantoprazole   40 mg Oral Daily   pyridOXINE   100 mg Oral Daily   sodium chloride  flush  3 mL Intravenous Q12H   vitamin E   400 Units Oral Daily   Continuous Infusions:   PRN Meds: ondansetron  **OR** ondansetron  (ZOFRAN ) IV, senna-docusate, traMADol   Time spent: 35 minutes  Author: Alban Pepper. MD [facility] 09/24/2024 8:47 AM  To reach On-call, see care teams to locate the attending and reach out  to them via [email or web address]. If 7PM-7AM, please contact night-coverage If you still have difficulty reaching the attending provider, please page the [facility] (Director on Call) for [facility] on amion for assistance.

## 2024-09-25 ENCOUNTER — Other Ambulatory Visit: Payer: Self-pay

## 2024-09-25 DIAGNOSIS — G20B2 Parkinson's disease with dyskinesia, with fluctuations: Secondary | ICD-10-CM

## 2024-09-25 LAB — BASIC METABOLIC PANEL WITH GFR
Anion gap: 6 (ref 5–15)
BUN: 23 mg/dL (ref 8–23)
CO2: 30 mmol/L (ref 22–32)
Calcium: 9.8 mg/dL (ref 8.9–10.3)
Chloride: 101 mmol/L (ref 98–111)
Creatinine, Ser: 0.71 mg/dL (ref 0.44–1.00)
GFR, Estimated: >60 mL/min
Glucose, Bld: 89 mg/dL (ref 70–99)
Potassium: 3.9 mmol/L (ref 3.5–5.1)
Sodium: 137 mmol/L (ref 135–145)

## 2024-09-25 LAB — MAGNESIUM: Magnesium: 2.2 mg/dL (ref 1.7–2.4)

## 2024-09-25 LAB — CK: Total CK: 33 U/L — ABNORMAL LOW (ref 38–234)

## 2024-09-25 LAB — PHOSPHORUS: Phosphorus: 2.9 mg/dL (ref 2.5–4.6)

## 2024-09-25 LAB — GLUCOSE, CAPILLARY: Glucose-Capillary: 99 mg/dL (ref 70–99)

## 2024-09-25 MED ORDER — FUROSEMIDE 20 MG PO TABS: 20.0000 mg | ORAL_TABLET | Freq: Every day | ORAL | Status: DC

## 2024-09-25 MED ORDER — FERROUS SULFATE 325 (65 FE) MG PO TABS
325.0000 mg | ORAL_TABLET | Freq: Every day | ORAL | 0 refills | Status: AC
  Filled 2024-09-25: qty 30, 30d supply, fill #0

## 2024-09-25 MED ORDER — VITAMIN E 180 MG (400 UNIT) PO CAPS
400.0000 [IU] | ORAL_CAPSULE | Freq: Every day | ORAL | 0 refills | Status: AC
  Filled 2024-09-25: qty 100, 100d supply, fill #0

## 2024-09-25 NOTE — Discharge Instructions (Addendum)
 Please follow up with your primary care.   Your lasix  or Furosemide  was changed to one tablet every day.   Please continue to wear compression stockings during the day to help with the swelling in your legs.   Please take miralax  daily while you are taking iron  tablets, as iron  can be constipating.

## 2024-09-25 NOTE — TOC Transition Note (Signed)
 Transition of Care [facility]) - Discharge Note   Patient Details  Name: Michele Brewer MRN: [id number] Date of Birth: 1951-06-25  Transition [facility] Care [facility]) CM/SW Contact:  Dalia GORMAN Fuse, RN Phone Number: 09/25/2024, 3:38 PM   Clinical Narrative:    Patient is medically clear to discharge to home with [facility] PT/OT/RN/Aide/SW. Take voucher provided and TOC reviewed HH services.  No other TOC needs identified   Final next level of care: Home w Home Health Services Barriers to Discharge: Barriers Resolved   Patient Goals and CMS Choice Patient states their goals for this hospitalization and ongoing recovery are:: Wants to be able to walk out with her walker to go home- agrees to SNF CMS Medicare.gov Compare Post Acute Care list provided to:: Patient Choice offered to / list presented to : Patient      Discharge Placement                       Discharge Plan and Services Additional resources added to the After Visit Summary for     Discharge Planning Services: CM Consult Post Acute Care Choice: Skilled Nursing Facility          DME Arranged: N/A         HH Arranged: RN, PT, OT, Social Work, Nurse's Aide HH Agency: [facility]        Social Drivers of Health (SDOH) Interventions SDOH Screenings   Food Insecurity: Unknown (09/17/2024)  Housing: Low Risk (09/17/2024)  Transportation Needs: No Transportation Needs (09/17/2024)  Utilities: Not At Risk (09/17/2024)  Financial Resource Strain: Low Risk  (06/12/2024)   Received from [facility]  Social Connections: Moderately Integrated (09/17/2024)  Recent Concern: Social Connections - Moderately Isolated (08/18/2024)  Tobacco Use: Low Risk (09/16/2024)     Readmission Risk Interventions     No data to display

## 2024-09-25 NOTE — Plan of Care (Signed)
  Problem: Clinical Measurements: Goal: Will remain free from infection Outcome: Progressing Goal: Cardiovascular complication will be avoided Outcome: Progressing   Problem: Activity: Goal: Risk for activity intolerance will decrease Outcome: Progressing   Problem: Coping: Goal: Level of anxiety will decrease Outcome: Progressing   

## 2024-09-25 NOTE — TOC Progression Note (Signed)
 Transition of Care [facility]) - Progression Note    Patient Details  Name: Michele Brewer MRN: [id number] Date of Birth: 1950/10/24  [facility]) CM/SW Contact  Dalia GORMAN Fuse, RN Phone Number: 09/25/2024, 8:48 AM  Clinical Narrative:     Plan for DC to home with [facility] PT/OT/RN/Aide/SW. Finance unable to complete screening for Medicaid LTC because the patient is planning to discharge to home and not to a LTC facility.  TOC will continue to follow.  Expected Discharge Plan: Skilled Nursing Facility Barriers to Discharge: Continued Medical Work up               Expected Discharge Plan and Services   Discharge Planning Services: CM Consult Post Acute Care Choice: Skilled Nursing Facility Living arrangements for the past 2 months: Single Family Home                 DME Arranged: N/A         HH Arranged: NA           Social Drivers of Health (SDOH) Interventions SDOH Screenings   Food Insecurity: Unknown (09/17/2024)  Housing: Low Risk (09/17/2024)  Transportation Needs: No Transportation Needs (09/17/2024)  Utilities: Not At Risk (09/17/2024)  Financial Resource Strain: Low Risk  (06/12/2024)   Received from [facility]  Social Connections: Moderately Integrated (09/17/2024)  Recent Concern: Social Connections - Moderately Isolated (08/18/2024)  Tobacco Use: Low Risk (09/16/2024)    Readmission Risk Interventions     No data to display

## 2024-09-25 NOTE — Progress Notes (Signed)
 Mobility Specialist - Progress Note      09/25/24 1455  Mobility  Activity Ambulated with assistance;Stood at bedside  Level of Assistance Standby assist, set-up cues, supervision of patient - no hands on  Assistive Device Front wheel walker  Distance Ambulated (ft) 300 ft  Range of Motion/Exercises Active  Activity Response Tolerated well  Mobility Referral Yes  Mobility visit 1 Mobility   Pt resting in recliner on RA upon entry. Pt STS and ambulates to hallway around NS SBA with RW. Pt maintain upright walking mechanics throughout session and endorses only pain in knees. Pt returned to recliner and left with needs in reach. Chair alarm activated.   Guido Rumble Mobility Specialist 09/25/2024, 2:57 PM

## 2024-09-26 NOTE — Discharge Summary (Addendum)
 " Physician Discharge Summary   Patient: Michele Brewer MRN: [id number] DOB: 10-09-1950  Admit date:     09/16/2024  Discharge date: 09/25/2024  Discharge Physician: Alban Pepper   PCP: [name], MD   Recommendations at discharge:    Check iron  and vitamin levels as appropriate, see if patient tolerating increased sinemet  dos Will need midodrine  titrated.   Discharge Diagnoses: Principal Problem:   Failure to thrive in adult Active Problems:   Lower extremity weakness   Lower extremity edema   Malnutrition of moderate degree   Parkinson disease (HCC)   UTI (urinary tract infection)  Resolved Problems:   * No resolved hospital problems. [facility] Course: Per H&P HPI  Michele Brewer is a 74 y.o. year old female with medical history of Parkinson's disease, MDD presenting to the ED with worsening lower extremity edema making ambulation difficult. States it has been worsening for the last 6 weeks. She states it was abrupt weakness in her legs. States swelling and weakness occurred together. She was able to do her ADLs and iADLs prior to this but has been unable to to them.    ED workup: HDS stable.  Labs: CBC without leukocytosis, mild anemia at baseline.  BMP unremarkable. UA with signs of infection but no symptoms.  Urine culture pending. ProBNP elevated at 856.  CXR with low lung volumes but otherwise clear. Given pt's edema making it difficult for patient to ambulate,  [facility] contacted for admission.  Assessment and Plan: Parkinson disease  Worsened stiffness  Patient's increased rigidity likely secondary to her underlying Parkinson disease.  Her Sinemet  was increased to 1.5 tablets 3 times a day and she was also given continued on vitamin D  and B6.  Patient also stated that Robaxin  did help her symptoms.  She was also given a short course of steroids while inpatient.  She continued to need some assistance with moving around and performing her activities of daily living.   Insurance denied skilled nursing facility for rehab.  Patient discharged home with home health physical therapy, Occupational Therapy, and aide.  She will discharge with Sinemet  1.5 tablets TID. 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 She is on midodrine  chronically. She was normotensive here.  This was continued on discharge.   Elevated CK resolved Possibly in the setting of immobility.  Normal at the time of discharge.  History of lower extremity edema Unclear etiology, she was diuresed with IV Lasix . P.o. Lasix  was continued at discharge She would likely benefit from compression stockings however at this would be difficult for her to put on and take off as she has no assistance at home.   Urinary retention  Resolved  Iron  deficiency     Latest Ref Rng & Units 09/19/2024   10:31 AM 09/18/2024    5:28 AM 09/17/2024    4:29 AM  CBC  WBC 4.0 - 10.5 K/uL 4.2  4.7  4.3   Hemoglobin 12.0 - 15.0 g/dL 87.7  89.1  89.6   Hematocrit 36.0 - 46.0 % 36.9  29.4  28.6   Platelets 150 - 400 K/uL 178  167  173    Iron /TIBC/Ferritin/ %Sat    Component Value Date/Time   IRON  26 (L) 09/17/2024 0428   TIBC 298 09/17/2024 0428   IRONPCTSAT 9 (L) 09/17/2024 0428  Started on iron  supplementation, discussed continuing a bowel regimen given that iron  can be constipating   Vitamin E  and  b6 deficiency  Continued supplementation on discharge    Moderate malnutrition   1/7 RD progress note: NUTRITION DIAGNOSIS:  Moderate Malnutrition related to chronic illness (Parkinson's disease) as evidenced by mild fat depletion, moderate fat depletion, mild muscle depletion, moderate muscle depletion.   Height:  09/17/24        (P) 5' 4 (1.626 m) Weight:  09/24/24        65 kg BMI:  Body mass index is 24.6 kg/m (pended).   INTERVENTIONs:   -MVI with minerals daily -Meal supplements outpatient  -Continue 100 mg vitamin B 6 daily -Continue 400 units  vitamin E  daily        Consultants: Neurology  Procedures performed: None  Disposition: Home health Diet recommendation:  Regular diet DISCHARGE MEDICATION: Allergies as of 09/25/2024       Reactions   Citrus    rash   Peanut-containing Drug Products    Rash and itch        Medication List     TAKE these medications    carbidopa -levodopa  25-100 MG tablet Commonly known as: SINEMET  IR Take 1.5 tablets by mouth 2 (two) times daily. The timing of this medication is very important.   carbidopa -levodopa  25-100 MG tablet Commonly known as: SINEMET  IR Take 1.5 tablets by mouth at bedtime. The timing of this medication is very important.   acetaminophen  325 MG tablet Commonly known as: TYLENOL  Take 2 tablets (650 mg total) by mouth every 6 (six) hours as needed for mild pain, moderate pain, fever or headache (or Fever >/= 101).   cyanocobalamin  1000 MCG tablet Commonly known as: VITAMIN B12 Take 1 tablet (1,000 mcg total) by mouth daily.   docusate sodium  100 MG capsule Commonly known as: COLACE Take 1 capsule (100 mg total) by mouth 2 (two) times daily.   feeding supplement (KATE [employer]ARMS STANDARD ENT 1.4) Liqd liquid Take 325 mLs by mouth 3 (three) times daily between meals.   FeroSul 325 (65 Fe) MG tablet Generic drug: ferrous sulfate  Take 1 tablet (325 mg total) by mouth daily with breakfast.   fluticasone  50 MCG/ACT nasal spray Commonly known as: FLONASE  Place 2 sprays into both nostrils daily.   folic acid  1 MG tablet Commonly known as: FOLVITE  Take 1 tablet (1 mg total) by mouth daily.   furosemide  20 MG tablet Commonly known as: LASIX  Take 1 tablet (20 mg total) by mouth daily. What changed: when to take this   midodrine  5 MG tablet Commonly known as: PROAMATINE  Take 1 tablet (5 mg total) by mouth 3 (three) times daily with meals.   multivitamin with minerals Tabs tablet Take 1 tablet by mouth daily.   polyethylene glycol 17 g packet Commonly  known as: MIRALAX  / GLYCOLAX  Take 17 g by mouth daily as needed for moderate constipation.   pyridOXINE  100 MG tablet Commonly known as: VITAMIN B6 Take 1 tablet (100 mg total) by mouth daily.   Vitamin D  (Ergocalciferol ) 1.25 MG (50000 UNIT) Caps capsule Commonly known as: DRISDOL  Take 1 capsule (50,000 Units total) by mouth once a week.   Vitamin E  180 MG (400 UNIT) Caps Take 1 capsule (400 Units total) by mouth daily.        Contact information for follow-up providers     [name], MD Follow up.   Specialty: Internal Medicine Why: hospital follow up Contact information: [address] [place] [address] [phone]  Contact information for after-discharge care     Destination     [facility] .   Service: Skilled Nursing Contact information: [address] [place]  [address] [phone]             Home Medical Care     [facility]) .   Service: Home Health Services Contact information: [address] [place]  [address] [phone]                    Discharge Exam: Michele Brewer   09/23/24 0507 09/24/24 0405 09/25/24 0439  Weight: 62.9 kg 65 kg 63.9 kg   Physical Exam  Constitutional: In no distress. Chronically ill appearing  Cardiovascular: Normal rate, regular rhythm. No lower extremity edema  Pulmonary: Non labored breathing on room air, no wheezing or rales.   Abdominal: Soft. Non distended and non tender   Neurological: Alert and oriented to person, place, and time. Non focal  Skin: Skin is warm and dry.    Condition at discharge: fair  The results of significant diagnostics from this hospitalization (including imaging, microbiology, ancillary and laboratory) are listed below for reference.   Imaging Studies: MR CERVICAL SPINE WO  CONTRAST Result Date: 09/20/2024 EXAM: MRI CERVICAL AND THORACIC SPINE WITHOUT CONTRAST 09/19/2024 09:33:51 PM TECHNIQUE: Multiplanar multisequence MRI of the cervical and thoracic spine was performed without the administration of intravenous contrast. COMPARISON: MR Cervical Spine 07/15/2022. CLINICAL HISTORY: Acute cervical myelopathy. History of Parkinson's disease. FINDINGS: BONES AND ALIGNMENT: Normal alignment. Normal vertebral body heights. Marrow signal is unremarkable. No abnormal enhancement. SPINAL CORD: Normal spinal cord size. Normal spinal cord signal. SOFT TISSUES: Unremarkable. CERVICAL DISC LEVELS: C2-C3: No significant disc herniation. No spinal canal stenosis or neural foraminal narrowing. C3-C4: Small disc bulge. No central spinal canal or neural foraminal stenosis. C4-C5: Small disc bulge with right greater than left uncovertebral hypertrophy. Unchanged severe right and moderate left neural foraminal stenosis. Mild spinal canal stenosis. C5-C6: Small disc bulge with right greater than left uncovertebral hypertrophy. Moderate right and mild left neural foraminal stenosis. C6-C7: Small disc bulge with bilateral uncovertebral spurring. Moderate left neural foraminal stenosis. No spinal canal stenosis. C7-T1: No significant disc herniation. No spinal canal stenosis or neural foraminal narrowing. THORACIC DISC LEVELS: Mild midthoracic degenerative disc disease without spinal canal or neural foraminal stenosis. Incidental Abdominal Findings: Cholelithiasis. IMPRESSION: 1. At C4-5, small disc bulge with right greater than left uncovertebral hypertrophy, unchanged severe right and moderate left neural foraminal stenosis, and mild spinal canal stenosis. 2. At C5-6, small disc bulge with right greater than left uncovertebral hypertrophy and moderate right and mild left neural foraminal stenosis. 3. At C6-7, small disc bulge with bilateral uncovertebral spurring and moderate left neural foraminal stenosis,  without spinal canal stenosis. 4. Mild midthoracic degenerative disc disease without spinal canal or neural foraminal stenosis. Electronically signed by: Franky Stanford MD 09/20/2024 12:42 AM EST RP Workstation: [id number]   MR THORACIC SPINE WO CONTRAST Result Date: 09/20/2024 EXAM: MRI CERVICAL AND THORACIC SPINE WITHOUT CONTRAST 09/19/2024 09:33:51 PM TECHNIQUE: Multiplanar multisequence MRI of the cervical and thoracic spine was performed without the administration of intravenous contrast. COMPARISON: MR Cervical Spine 07/15/2022. CLINICAL HISTORY: Acute cervical myelopathy. History of Parkinson's disease. FINDINGS: BONES AND ALIGNMENT: Normal alignment. Normal vertebral body heights. Marrow signal is unremarkable. No abnormal enhancement. SPINAL CORD: Normal spinal cord size. Normal spinal cord signal. SOFT TISSUES: Unremarkable. CERVICAL  DISC LEVELS: C2-C3: No significant disc herniation. No spinal canal stenosis or neural foraminal narrowing. C3-C4: Small disc bulge. No central spinal canal or neural foraminal stenosis. C4-C5: Small disc bulge with right greater than left uncovertebral hypertrophy. Unchanged severe right and moderate left neural foraminal stenosis. Mild spinal canal stenosis. C5-C6: Small disc bulge with right greater than left uncovertebral hypertrophy. Moderate right and mild left neural foraminal stenosis. C6-C7: Small disc bulge with bilateral uncovertebral spurring. Moderate left neural foraminal stenosis. No spinal canal stenosis. C7-T1: No significant disc herniation. No spinal canal stenosis or neural foraminal narrowing. THORACIC DISC LEVELS: Mild midthoracic degenerative disc disease without spinal canal or neural foraminal stenosis. Incidental Abdominal Findings: Cholelithiasis. IMPRESSION: 1. At C4-5, small disc bulge with right greater than left uncovertebral hypertrophy, unchanged severe right and moderate left neural foraminal stenosis, and mild spinal canal stenosis. 2. At  C5-6, small disc bulge with right greater than left uncovertebral hypertrophy and moderate right and mild left neural foraminal stenosis. 3. At C6-7, small disc bulge with bilateral uncovertebral spurring and moderate left neural foraminal stenosis, without spinal canal stenosis. 4. Mild midthoracic degenerative disc disease without spinal canal or neural foraminal stenosis. Electronically signed by: Franky Stanford MD 09/20/2024 12:42 AM EST RP Workstation: [id number]   US  Venous Img Lower Bilateral (DVT) Result Date: 09/17/2024 EXAM: ULTRASOUND DUPLEX OF THE BILATERAL LOWER EXTREMITY VEINS TECHNIQUE: Duplex ultrasound using B-mode/gray scaled imaging and Doppler spectral analysis and color flow was obtained of the deep venous structures of the bilateral lower extremity. COMPARISON: US  Extremity Low Venous 08/02/2024. CLINICAL HISTORY: Lower extremity edema. FINDINGS: LEFT: The common femoral vein, femoral vein, popliteal vein, and posterior tibial vein demonstrate normal compressibility with normal color flow and spectral analysis. RIGHT: The common femoral vein, femoral vein, popliteal vein, and posterior tibial vein demonstrate normal compressibility with normal color flow and spectral analysis. IMPRESSION: 1. No evidence of DVT. Electronically signed by: Katheleen Faes MD 09/17/2024 01:06 PM EST RP Workstation: [id number]   MR LUMBAR SPINE WO CONTRAST Result Date: 09/17/2024 EXAM: MRI LUMBAR SPINE 09/17/2024 01:29:14 AM TECHNIQUE: Multiplanar multisequence MRI of the lumbar spine was performed without the administration of intravenous contrast. COMPARISON: None available. CLINICAL HISTORY: Low back pain, cauda equina syndrome suspected. FINDINGS: BONES AND ALIGNMENT: Normal alignment. Normal vertebral body heights. Bone marrow signal is unremarkable. SPINAL CORD: The conus terminates normally. SOFT TISSUES: No paraspinal mass. L1-L2: No significant disc herniation. No spinal canal stenosis or neural  foraminal narrowing. L2-L3: No significant disc herniation. No spinal canal stenosis or neural foraminal narrowing. L3-L4: No significant disc herniation. No spinal canal stenosis or neural foraminal narrowing. L4-L5: Mild facet hypertrophy and small left asymmetric disc bulge. No central spinal canal or neural foraminal stenosis. L5-S1: No significant disc herniation. No spinal canal stenosis or neural foraminal narrowing. PELVIS: Distended urinary bladder. IMPRESSION: 1. No evidence of cauda equina syndrome. 2. Distended urinary bladder. 3. L4-5 mild facet hypertrophy and small left asymmetric disc bulge without central spinal canal or neural foraminal stenosis. Electronically signed by: Franky Stanford MD 09/17/2024 01:45 AM EST RP Workstation: [id number]   DG Pelvis 1-2 Views Result Date: 09/17/2024 EXAM: 1 or 2 VIEW(S) XRAY OF THE PELVIS 09/17/2024 12:44:02 AM COMPARISON: None available. CLINICAL HISTORY: Lower extremity weakness FINDINGS: BONES AND JOINTS: Right hip bipolar hemiarthroplasty noted. Degenerative changes of the lower lumbar spine. No acute fracture. No malalignment. SOFT TISSUES: The soft tissues are unremarkable. IMPRESSION: 1. Right hip bipolar hemiarthroplasty. Electronically signed by: Dorethia Molt MD 09/17/2024 01:30  AM EST RP Workstation: [id number]   DG Chest 2 View Result Date: 09/16/2024 CLINICAL DATA:  Shortness of breath and bilateral lower extremity edema. EXAM: CHEST - 2 VIEW COMPARISON:  06/02/2023 FINDINGS: The cardiac silhouette, mediastinal and hilar contours are within normal limits and stable. Mild tortuosity and calcification of the thoracic aorta. Low lung volumes with vascular crowding and streaky bibasilar atelectasis. There may be mild central vascular congestion but no pulmonary edema or pleural effusions. No pulmonary lesions or pneumothorax. The bony thorax is intact. Remote healed left rib fractures again noted. IMPRESSION: 1. Low lung volumes with vascular  crowding and streaky bibasilar atelectasis. 2. Possible mild central vascular congestion but no pulmonary edema or pleural effusions. Electronically Signed   By: MYRTIS Stammer M.D.   On: 09/16/2024 19:44    Microbiology: Results for orders placed or performed during the hospital encounter of 09/16/24  Urine Culture     Status: Abnormal   Collection Time: 09/16/24  7:53 PM   Specimen: Urine, Clean Catch  Result Value Ref Range Status   Specimen Description   Final    URINE, CLEAN CATCH Performed at [facility], [address] [place], [place] [address]    Special Requests   Final    NONE Performed at [facility], [address], [place], [place] [address]    Culture MULTIPLE SPECIES PRESENT, SUGGEST RECOLLECTION (A)  Final   Report Status 09/18/2024 FINAL  Final    Labs: CBC: No results for input(s): WBC, NEUTROABS, HGB, HCT, MCV, PLT in the last 168 hours. Basic Metabolic Panel: Recent Labs  Lab 09/20/24 0545 09/25/24 0559  NA 142 137  K 3.7 3.9  CL 100 101  CO2 33* 30  GLUCOSE 85 89  BUN 22 23  CREATININE 0.73 0.71  CALCIUM 9.8 9.8  MG  --  2.2  PHOS  --  2.9   Liver Function Tests: No results for input(s): AST, ALT, ALKPHOS, BILITOT, PROT, ALBUMIN in the last 168 hours. CBG: Recent Labs  Lab 09/22/24 0759 09/22/24 1157 09/23/24 0829 09/24/24 0739 09/25/24 0719  GLUCAP 94 100* 100* 85 99    Discharge time spent: greater than 30 minutes.  Signed: Alban Pepper, MD [facility] 09/26/2024 "

## 2024-10-05 ENCOUNTER — Observation Stay: Admission: EM | Admit: 2024-10-05 | Discharge: 2024-10-11 | Disposition: A | Discharge status: Skilled Nursing Facility

## 2024-10-05 ENCOUNTER — Other Ambulatory Visit: Payer: Self-pay

## 2024-10-05 ENCOUNTER — Encounter: Payer: Self-pay | Admitting: Emergency Medicine

## 2024-10-05 ENCOUNTER — Observation Stay

## 2024-10-05 DIAGNOSIS — R600 Localized edema: Secondary | ICD-10-CM | POA: Diagnosis present

## 2024-10-05 DIAGNOSIS — F32A Depression, unspecified: Secondary | ICD-10-CM | POA: Diagnosis present

## 2024-10-05 DIAGNOSIS — D649 Anemia, unspecified: Secondary | ICD-10-CM | POA: Diagnosis not present

## 2024-10-05 DIAGNOSIS — I872 Venous insufficiency (chronic) (peripheral): Secondary | ICD-10-CM | POA: Diagnosis not present

## 2024-10-05 DIAGNOSIS — E440 Moderate protein-calorie malnutrition: Secondary | ICD-10-CM | POA: Diagnosis not present

## 2024-10-05 DIAGNOSIS — Z79899 Other long term (current) drug therapy: Secondary | ICD-10-CM | POA: Insufficient documentation

## 2024-10-05 DIAGNOSIS — Z96641 Presence of right artificial hip joint: Secondary | ICD-10-CM | POA: Insufficient documentation

## 2024-10-05 DIAGNOSIS — G20A1 Parkinson's disease without dyskinesia, without mention of fluctuations: Secondary | ICD-10-CM | POA: Diagnosis present

## 2024-10-05 DIAGNOSIS — I951 Orthostatic hypotension: Secondary | ICD-10-CM | POA: Diagnosis present

## 2024-10-05 DIAGNOSIS — W19XXXA Unspecified fall, initial encounter: Secondary | ICD-10-CM | POA: Diagnosis present

## 2024-10-05 DIAGNOSIS — R531 Weakness: Secondary | ICD-10-CM | POA: Diagnosis present

## 2024-10-05 DIAGNOSIS — D72819 Decreased white blood cell count, unspecified: Secondary | ICD-10-CM | POA: Insufficient documentation

## 2024-10-05 DIAGNOSIS — R29898 Other symptoms and signs involving the musculoskeletal system: Secondary | ICD-10-CM | POA: Diagnosis present

## 2024-10-05 DIAGNOSIS — I9589 Other hypotension: Secondary | ICD-10-CM | POA: Insufficient documentation

## 2024-10-05 DIAGNOSIS — I878 Other specified disorders of veins: Principal | ICD-10-CM

## 2024-10-05 DIAGNOSIS — Z91010 Allergy to peanuts: Secondary | ICD-10-CM | POA: Diagnosis not present

## 2024-10-05 DIAGNOSIS — G20C Parkinsonism, unspecified: Secondary | ICD-10-CM | POA: Diagnosis not present

## 2024-10-05 LAB — CBC
HCT: 33.8 % — ABNORMAL LOW (ref 36.0–46.0)
HCT: 33.5 % — ABNORMAL LOW (ref 36.0–46.0)
Hemoglobin: 11.2 g/dL — ABNORMAL LOW (ref 12.0–15.0)
Hemoglobin: 11.4 g/dL — ABNORMAL LOW (ref 12.0–15.0)
MCH: 31.3 pg (ref 26.0–34.0)
MCH: 31.1 pg (ref 26.0–34.0)
MCHC: 33.1 g/dL (ref 30.0–36.0)
MCHC: 34 g/dL (ref 30.0–36.0)
MCV: 94.4 fL (ref 80.0–100.0)
MCV: 91.5 fL (ref 80.0–100.0)
Platelets: 194 K/uL (ref 150–400)
Platelets: 191 K/uL (ref 150–400)
RBC: 3.58 MIL/uL — ABNORMAL LOW (ref 3.87–5.11)
RBC: 3.66 MIL/uL — ABNORMAL LOW (ref 3.87–5.11)
RDW: 14.5 % (ref 11.5–15.5)
RDW: 14.4 % (ref 11.5–15.5)
WBC: 5.5 K/uL (ref 4.0–10.5)
WBC: 5 K/uL (ref 4.0–10.5)
nRBC: 0 % (ref 0.0–0.2)
nRBC: 0 % (ref 0.0–0.2)

## 2024-10-05 LAB — URINALYSIS, ROUTINE W REFLEX MICROSCOPIC
Bilirubin Urine: NEGATIVE
Glucose, UA: NEGATIVE mg/dL
Ketones, ur: NEGATIVE mg/dL
Leukocytes,Ua: NEGATIVE
Nitrite: NEGATIVE
Protein, ur: NEGATIVE mg/dL
Specific Gravity, Urine: 1.009 (ref 1.005–1.030)
Squamous Epithelial / HPF: 0 /HPF (ref 0–5)
pH: 7 (ref 5.0–8.0)

## 2024-10-05 LAB — BASIC METABOLIC PANEL WITH GFR
Anion gap: 10 (ref 5–15)
BUN: 18 mg/dL (ref 8–23)
CO2: 26 mmol/L (ref 22–32)
Calcium: 10.2 mg/dL (ref 8.9–10.3)
Chloride: 103 mmol/L (ref 98–111)
Creatinine, Ser: 0.86 mg/dL (ref 0.44–1.00)
GFR, Estimated: >60 mL/min
Glucose, Bld: 94 mg/dL (ref 70–99)
Potassium: 4.5 mmol/L (ref 3.5–5.1)
Sodium: 138 mmol/L (ref 135–145)

## 2024-10-05 LAB — CREATININE, SERUM
Creatinine, Ser: 0.84 mg/dL (ref 0.44–1.00)
GFR, Estimated: >60 mL/min

## 2024-10-05 LAB — PRO BRAIN NATRIURETIC PEPTIDE: Pro Brain Natriuretic Peptide: 124 pg/mL

## 2024-10-05 MED ORDER — CARBIDOPA-LEVODOPA 25-100 MG PO TABS
1.5000 | ORAL_TABLET | Freq: Once | ORAL | Status: AC
  Administered 2024-10-05: 1.5 via ORAL
  Filled 2024-10-05: qty 2

## 2024-10-05 MED ORDER — ACETAMINOPHEN 325 MG PO TABS
650.0000 mg | ORAL_TABLET | Freq: Four times a day (QID) | ORAL | Status: DC | PRN
  Administered 2024-10-05: 650 mg via ORAL
  Filled 2024-10-05: qty 2

## 2024-10-05 MED ORDER — FERROUS SULFATE 325 (65 FE) MG PO TABS
325.0000 mg | ORAL_TABLET | Freq: Every day | ORAL | Status: DC
  Administered 2024-10-05 – 2024-10-11 (×7): 325 mg via ORAL
  Filled 2024-10-05 (×7): qty 1

## 2024-10-05 MED ORDER — ENOXAPARIN SODIUM 40 MG/0.4ML IJ SOSY
40.0000 mg | PREFILLED_SYRINGE | INTRAMUSCULAR | Status: DC
  Administered 2024-10-05 – 2024-10-11 (×7): 40 mg via SUBCUTANEOUS
  Filled 2024-10-05 (×7): qty 0.4

## 2024-10-05 MED ORDER — OXYCODONE HCL 5 MG PO TABS
5.0000 mg | ORAL_TABLET | ORAL | Status: DC | PRN
  Filled 2024-10-05: qty 1

## 2024-10-05 MED ORDER — CARBIDOPA-LEVODOPA 25-100 MG PO TABS: 1.5000 | ORAL_TABLET | Freq: Two times a day (BID) | ORAL | Status: DC

## 2024-10-05 MED ORDER — FUROSEMIDE 20 MG PO TABS
20.0000 mg | ORAL_TABLET | Freq: Every day | ORAL | Status: DC
  Administered 2024-10-05 – 2024-10-07 (×3): 20 mg via ORAL
  Filled 2024-10-05 (×3): qty 1

## 2024-10-05 MED ORDER — MORPHINE SULFATE (PF) 2 MG/ML IV SOLN: 2.0000 mg | INTRAVENOUS | Status: DC | PRN

## 2024-10-05 MED ORDER — ONDANSETRON HCL 4 MG PO TABS: 4.0000 mg | ORAL_TABLET | Freq: Four times a day (QID) | ORAL | Status: DC | PRN

## 2024-10-05 MED ORDER — ACETAMINOPHEN 650 MG RE SUPP: 650.0000 mg | Freq: Four times a day (QID) | RECTAL | Status: DC | PRN

## 2024-10-05 MED ORDER — OXYCODONE HCL 5 MG PO TABS
5.0000 mg | ORAL_TABLET | Freq: Once | ORAL | Status: AC
  Administered 2024-10-05: 5 mg via ORAL
  Filled 2024-10-05: qty 1

## 2024-10-05 MED ORDER — FUROSEMIDE 10 MG/ML IJ SOLN
40.0000 mg | Freq: Once | INTRAMUSCULAR | Status: AC
  Administered 2024-10-05: 40 mg via INTRAVENOUS
  Filled 2024-10-05: qty 4

## 2024-10-05 MED ORDER — MIDODRINE HCL 5 MG PO TABS
5.0000 mg | ORAL_TABLET | Freq: Three times a day (TID) | ORAL | Status: DC
  Administered 2024-10-05 – 2024-10-11 (×19): 5 mg via ORAL
  Filled 2024-10-05 (×19): qty 1

## 2024-10-05 MED ORDER — ACETAMINOPHEN 325 MG PO TABS
975.0000 mg | ORAL_TABLET | Freq: Once | ORAL | Status: AC
  Administered 2024-10-05: 975 mg via ORAL
  Filled 2024-10-05: qty 3

## 2024-10-05 MED ORDER — VITAMIN B-12 1000 MCG PO TABS
1000.0000 ug | ORAL_TABLET | Freq: Every day | ORAL | Status: DC
  Administered 2024-10-05 – 2024-10-11 (×7): 1000 ug via ORAL
  Filled 2024-10-05 (×6): qty 1
  Filled 2024-10-05: qty 2

## 2024-10-05 MED ORDER — POLYETHYLENE GLYCOL 3350 17 G PO PACK
17.0000 g | PACK | Freq: Every day | ORAL | Status: DC | PRN
  Administered 2024-10-11: 17 g via ORAL
  Filled 2024-10-05 (×3): qty 1

## 2024-10-05 MED ORDER — ONDANSETRON HCL 4 MG/2ML IJ SOLN
4.0000 mg | Freq: Four times a day (QID) | INTRAMUSCULAR | Status: DC | PRN
  Administered 2024-10-06: 4 mg via INTRAVENOUS
  Filled 2024-10-05: qty 2

## 2024-10-05 MED ORDER — VITAMIN E 180 MG (400 UNIT) PO CAPS
400.0000 [IU] | ORAL_CAPSULE | Freq: Every day | ORAL | Status: DC
  Administered 2024-10-06 – 2024-10-11 (×6): 400 [IU] via ORAL
  Filled 2024-10-05 (×7): qty 1

## 2024-10-05 NOTE — ED Notes (Signed)
 Pt I&O cath'd using 16 Fr soft tip cath with assistance from x2 NT for UA. Pt tolerated cath poorly due to pain when spreading legs. Approx 400 mL urine returned.

## 2024-10-05 NOTE — ED Triage Notes (Signed)
 Pt presents to the ED via [facility] with complaints of weakness x 1 day. Hx of parkinson's and has a visible tremor and endorses this has caused a significant decline in her ADLs. A&Ox4 at this time. Denies CP or SOB.

## 2024-10-05 NOTE — H&P (Signed)
 "  History and Physical    Michele Brewer FMW:[id number] DOB: 20-Nov-1950 DOA: 10/05/2024  DOS: the patient was seen and examined on 10/05/2024  PCP: [name], MD   Patient coming from: Home  I have personally briefly reviewed patient's old medical records in [facility]  Chief Complaint: Fall and generalized weakness  HPI: Michele Brewer is a pleasant 74 y.o. female with medical history significant for Parkinson's disease, depression who came into ED complaining of generalized weakness, fall and not able to be taken care of at home.  Patient was recently admitted and discharged from [facility] between 09/16/2024 and 09/26/2023.  Patient was being planned to transfer to skilled nursing facility during that admission but there was denial from the insurance and patient was discharged home.  Patient stated that at home she was living with her mother and sister.  She was trying to get up but was not able to do so rather she fell landing on her knees.  She denies hitting her head.  Denies any trauma to her knees and other part of the body.  EMS was called and brought into the emergency room at [facility].  Patient denies any fever, chills, nausea, vomiting, chest pain, cough, shortness of breath, palpitations.  ED Course: Upon arrival to the ED, patient is found to be tachycardic at 109, CBC and BMP unremarkable.  No imaging studies was done.  Hospitalist service was consulted for evaluation for admission for generalized weakness and fall at home.  Review of Systems:  ROS  All other systems negative except as noted in the HPI.  Past Medical History:  Diagnosis Date   Depression    Elevated CK 06/03/2023   Parkinson disease [facility])     Past Surgical History:  Procedure Laterality Date   CESAREAN SECTION     HIP ARTHROPLASTY Right 06/17/2024   Procedure: HEMIARTHROPLASTY (BIPOLAR) HIP, POSTERIOR APPROACH FOR FRACTURE;  Surgeon: Edie Norleen PARAS, MD;  Location: [facility] ORS;   Service: Orthopedics;  Laterality: Right;     reports that she has never smoked. She has never used smokeless tobacco. She reports that she does not currently use alcohol . She reports that she does not use drugs.  Allergies[1]  Family History  Problem Relation Age of Onset   Gout Mother    Heart disease Father     Prior to Admission medications  Medication Sig Start Date End Date Taking? Authorizing Provider  ferrous sulfate  325 (65 FE) MG tablet Take 1 tablet (325 mg total) by mouth daily with breakfast. 09/26/24  Yes Franchot Novel, MD  furosemide  (LASIX ) 20 MG tablet Take 1 tablet (20 mg total) by mouth daily. 09/25/24  Yes Franchot Novel, MD  midodrine  (PROAMATINE ) 5 MG tablet Take 1 tablet (5 mg total) by mouth 3 (three) times daily with meals. 08/25/24  Yes Wieting, Richard, MD  Vitamin E  180 MG (400 UNIT) CAPS Take 1 capsule (400 Units total) by mouth daily. 09/26/24  Yes Franchot Novel, MD  acetaminophen  (TYLENOL ) 325 MG tablet Take 2 tablets (650 mg total) by mouth every 6 (six) hours as needed for mild pain, moderate pain, fever or headache (or Fever >/= 101). Patient not taking: Reported on 10/05/2024 07/18/22   Josette Ade, MD  carbidopa -levodopa  (SINEMET  IR) 25-100 MG tablet Take 1.5 tablets by mouth 2 (two) times daily. Patient not taking: Reported on 10/05/2024 07/18/22   Josette Ade, MD  carbidopa -levodopa  (SINEMET  IR) 25-100 MG tablet Take 1.5 tablets by mouth at bedtime.  Patient not taking: Reported on 10/05/2024 07/18/22   Josette Ade, MD  cyanocobalamin  1000 MCG tablet Take 1 tablet (1,000 mcg total) by mouth daily. Patient not taking: Reported on 10/05/2024 08/25/24   Josette Ade, MD  docusate sodium  (COLACE) 100 MG capsule Take 1 capsule (100 mg total) by mouth 2 (two) times daily. Patient not taking: Reported on 09/08/2024 06/19/24   Patel, Sona, MD  fluticasone  (FLONASE ) 50 MCG/ACT nasal spray Place 2 sprays into both nostrils daily. Patient not  taking: Reported on 09/08/2024 06/10/23   Fausto Sor A, DO  folic acid  (FOLVITE ) 1 MG tablet Take 1 tablet (1 mg total) by mouth daily. Patient not taking: Reported on 09/08/2024 07/19/22   Josette Ade, MD  Multiple Vitamin (MULTIVITAMIN WITH MINERALS) TABS tablet Take 1 tablet by mouth daily. Patient not taking: Reported on 09/08/2024 07/19/22   Josette Ade, MD  Nutritional Supplements (FEEDING SUPPLEMENT, KATE FARMS STANDARD ENT 1.4,) LIQD liquid Take 325 mLs by mouth 3 (three) times daily between meals. Patient not taking: Reported on 10/05/2024 08/25/24   Josette Ade, MD  polyethylene glycol (MIRALAX  / GLYCOLAX ) 17 g packet Take 17 g by mouth daily as needed for moderate constipation. Patient not taking: Reported on 10/05/2024 07/18/22   Josette Ade, MD  Pyridoxine  HCl (VITAMIN B6) 100 MG TABS Take 1 tablet (100 mg total) by mouth daily. Patient not taking: Reported on 10/05/2024 08/25/24   Josette Ade, MD  Vitamin D , Ergocalciferol , (DRISDOL ) 1.25 MG (50000 UNIT) CAPS capsule Take 1 capsule (50,000 Units total) by mouth once a week. Patient not taking: Reported on 09/17/2024 06/06/24       Physical Exam: Vitals:   10/05/24 1000 10/05/24 1030 10/05/24 1100 10/05/24 1130  BP: (!) 113/58 132/61 127/73 116/60  Pulse: 83 95 96 74  Resp: 17 20 19 16   Temp:      TempSrc:      SpO2: 97% 97% 96% 97%  Weight:      Height:        Physical Exam   Constitutional: Alert, awake, calm, comfortable HEENT: Neck supple Respiratory: Clear to auscultation B/L, no wheezing, no rales.  Cardiovascular: Regular rate and rhythm, no murmurs / rubs / gallops.  Bilateral lower extremity 2+ pitting edema. 2+ pedal pulses. No carotid bruits.  Abdomen: Soft, no tenderness, Bowel sounds positive.  Musculoskeletal: no clubbing / cyanosis. Good ROM, no contractures. Normal muscle tone.  Skin: no rashes, lesions, ulcers. Neurologic: CN 2-12 grossly intact. Sensation intact, No focal  deficit identified Psychiatric: Alert and oriented x 3. Normal mood.    Labs on Admission: I have personally reviewed following labs and imaging studies  CBC: Recent Labs  Lab 10/05/24 0403 10/05/24 0917  WBC 5.5 5.0  HGB 11.2* 11.4*  HCT 33.8* 33.5*  MCV 94.4 91.5  PLT 194 191   Basic Metabolic Panel: Recent Labs  Lab 10/05/24 0403 10/05/24 0917  NA 138  --   K 4.5  --   CL 103  --   CO2 26  --   GLUCOSE 94  --   BUN 18  --   CREATININE 0.86 0.84  CALCIUM 10.2  --    GFR: Estimated Creatinine Clearance: 50.7 mL/min (by C-G formula based on SCr of 0.84 mg/dL). Liver Function Tests: No results for input(s): AST, ALT, ALKPHOS, BILITOT, PROT, ALBUMIN in the last 168 hours. No results for input(s): LIPASE, AMYLASE in the last 168 hours. No results for input(s): AMMONIA in the last 168 hours.  Coagulation Profile: No results for input(s): INR, PROTIME in the last 168 hours. Cardiac Enzymes: No results for input(s): CKTOTAL, CKMB, CKMBINDEX, TROPONINI, TROPONINIHS in the last 168 hours. BNP (last 3 results) No results for input(s): BNP in the last 8760 hours. HbA1C: No results for input(s): HGBA1C in the last 72 hours. CBG: No results for input(s): GLUCAP in the last 168 hours. Lipid Profile: No results for input(s): CHOL, HDL, LDLCALC, TRIG, CHOLHDL, LDLDIRECT in the last 72 hours. Thyroid  Function Tests: No results for input(s): TSH, T4TOTAL, FREET4, T3FREE, THYROIDAB in the last 72 hours. Anemia Panel: No results for input(s): VITAMINB12, FOLATE, FERRITIN, TIBC, IRON , RETICCTPCT in the last 72 hours. Urine analysis:    Component Value Date/Time   COLORURINE STRAW (A) 10/05/2024 0600   APPEARANCEUR CLEAR (A) 10/05/2024 0600   LABSPEC 1.009 10/05/2024 0600   PHURINE 7.0 10/05/2024 0600   GLUCOSEU NEGATIVE 10/05/2024 0600   HGBUR SMALL (A) 10/05/2024 0600   BILIRUBINUR NEGATIVE 10/05/2024  0600   KETONESUR NEGATIVE 10/05/2024 0600   PROTEINUR NEGATIVE 10/05/2024 0600   NITRITE NEGATIVE 10/05/2024 0600   LEUKOCYTESUR NEGATIVE 10/05/2024 0600    Radiological Exams on Admission: I have personally reviewed images No results found.  EKG: N/A    Assessment/Plan Principal Problem:   Fall Active Problems:   Lower extremity weakness   Lower extremity edema   Depression   Parkinson disease (HCC)   Chronic anemia    Assessment and Plan: 74 year old female with Parkinson's disease, depression, chronic anemia who was brought in from home for generalized weakness, fall at home and not able to be taken care of at home.  1.  Generalized weakness/fall - Patient was not able to taken care of at home since she was discharged from the hospital. - Patient says she was feeling weak and not able to stand or get up. - She denies any fever or chills dysuria hematuria. - CT scan of the head is pending - She will be given PT/OT - She may need to go to skilled rehab.  2.  Parkinson's disease - It appears that she was not taking any carbidopa  levodopa . - Continue to give supportive care  3.  Depression - Resume her home medications for depression  4.  Chronic anemia - Hemoglobin hematocrit has been stable. - Continue iron  supplementation - Continue to monitor  5.  Bilateral lower extremity edema - She was on 20 mg p.o. Lasix . - Continue Lasix .  6.  Chronic hypotension - Patient was given midodrine  during last admission. - Continue midodrine  5 mg 3 times daily. - Continue blood pressure monitoring     DVT prophylaxis: Lovenox  Code Status: DNR/DNI Family Communication: None Disposition Plan: Skilled nursing facility Consults called: None Admission status: Observation, Telemetry bed   Nena Rebel, MD [facility] 10/05/2024, 11:59 AM        [1]  Allergies Allergen Reactions   Citrus     rash   Peanut-Containing Drug Products     Rash and itch    "

## 2024-10-05 NOTE — ED Notes (Signed)
Pt repositioned to her left side.

## 2024-10-05 NOTE — ED Provider Notes (Signed)
 "  [facility] Provider Note    Event Date/Time   First MD Initiated Contact with Patient 10/05/24 [phone]     (approximate)   History   Weakness   HPI  Michele Brewer is a 74 y.o. female presenting today with concern of a fall.  She lives at home with her mother and sister, today apparently felt her legs give out underneath her and had a mechanical fall landing on her knees, denies any trauma to the head or other injury was not able to get up on her own and called EMS.  Complaining of weakness in both of her lower extremities was just recently seen and discharged from our facility for similar at that time seems insurance did not authorize long-term placement.  Patient with complaint of continued lower extremity swelling which apparently has been worsening since discharge and increasing weakness.  History of underlying Parkinson's, chronic tremor in her right upper extremity which continues today which is because significant difficulty with her daily activity.  I reviewed recent admission note, seems that she did have some for Parkinson's medications adjusted to help manage her symptoms.     Physical Exam   Triage Vital Signs: ED Triage Vitals  Encounter Vitals Group     BP 10/05/24 0313 138/80     Girls Systolic BP Percentile --      Girls Diastolic BP Percentile --      Boys Systolic BP Percentile --      Boys Diastolic BP Percentile --      Pulse Rate 10/05/24 0313 100     Resp 10/05/24 0313 18     Temp 10/05/24 0313 97.8 F (36.6 C)     Temp Source 10/05/24 0313 Oral     SpO2 10/05/24 0313 100 %     Weight 10/05/24 0314 139 lb 4.8 oz (63.2 kg)     Height 10/05/24 0314 5' 4 (1.626 m)     Head Circumference --      Peak Flow --      Pain Score 10/05/24 0314 4     Pain Loc --      Pain Education --      Exclude from Growth Chart --     Most recent vital signs: Vitals:   10/05/24 0313 10/05/24 0600  BP: 138/80 138/78  Pulse: 100 (!) 109  Resp: 18  19  Temp: 97.8 F (36.6 C) 97.9 F (36.6 C)  SpO2: 100% 98%     General: Awake, no distress.  CV:  Good peripheral perfusion.  Bilateral lower extremity swelling Resp:  Normal effort.  Abd:  No distention.  Soft nontender Neuro:  Tremor in the right upper extremity Other:     ED Results / Procedures / Treatments   Labs (all labs ordered are listed, but only abnormal results are displayed) Labs Reviewed  CBC - Abnormal; Notable for the following components:      Result Value   RBC 3.58 (*)    Hemoglobin 11.2 (*)    HCT 33.8 (*)    All other components within normal limits  URINALYSIS, ROUTINE W REFLEX MICROSCOPIC - Abnormal; Notable for the following components:   Color, Urine STRAW (*)    APPearance CLEAR (*)    Hgb urine dipstick SMALL (*)    Bacteria, UA RARE (*)    All other components within normal limits  BASIC METABOLIC PANEL WITH GFR  PRO BRAIN NATRIURETIC PEPTIDE     EKG  RADIOLOGY   PROCEDURES:  Critical Care performed: No  Procedures   MEDICATIONS ORDERED IN ED: Medications  acetaminophen  (TYLENOL ) tablet 975 mg (975 mg Oral Given 10/05/24 0421)  furosemide  (LASIX ) injection 40 mg (40 mg Intravenous Given 10/05/24 0421)  oxyCODONE  (Oxy IR/ROXICODONE ) immediate release tablet 5 mg (5 mg Oral Given 10/05/24 0557)     IMPRESSION / MDM / ASSESSMENT AND PLAN / ED COURSE  I reviewed the triage vital signs and the nursing notes.                               Patient's presentation is most consistent with acute illness / injury with system symptoms.  74 year old female presenting with concern of generalized weakness and a fall today.  Seems concern of increased frequency of falling, was just discharged for similar and at that time insurance did not cover for SNF.  Unfortunately now with continued falls at home, no apparent injury, the patient has bilateral lower extremity swelling which may be resulting in her frequent falls, obtaining labs here  I did review her recent echo which demonstrates appropriate heart function so likely venous stasis resulting in the symptomatology.  May warrant continued diuresis and continued work with physical therapy and Occupational Therapy.   Clinical Course as of 10/05/24 9357  [name] Oct 05, 2024  0641 Patient urinalysis is reassuring, I did speak with Dr. Cleatus who plans to have their team evaluate the patient to help determine safe and appropriate disposition for the patient. [SK]    Clinical Course User Index [SK] Fernand Rossie HERO, MD     FINAL CLINICAL IMPRESSION(S) / ED DIAGNOSES   Final diagnoses:  Venous stasis  Weakness     Rx / DC Orders   ED Discharge Orders     None        Note:  This document was prepared using Dragon voice recognition software and may include unintentional dictation errors.   Fernand Rossie HERO, MD 10/05/24 [phone]  "

## 2024-10-05 NOTE — Evaluation (Signed)
 Occupational Therapy Evaluation Patient Details Name: Michele Brewer MRN: [id number] DOB: 1951/04/11 Today's Date: 10/05/2024   History of Present Illness   74 y.o. female with medical history significant for Parkinson's disease, depression who came into ED complaining of generalized weakness, fall and not able to be taken care of at home.  Patient was recently admitted and discharged from [facility] between 09/16/2024 and 09/26/2023.  Patient was being planned to transfer to SNF during that admission but insurance denied and patient was discharged home. Patient stated she was living with her mother and sister. She was trying to get up but was not able to do so rather she fell landing on her knees.  She denies hitting her head.  Denies any trauma to her knees and other part of the body.  EMS was called and brought into the emergency room at [facility].     Clinical Impressions Pt was seen for OT evaluation this date, limited by fatigue. Prior to recent hospital admission, pt was mod indep with ADL and IADL, assisting mother with IADL. She lives with her mother and sister. Since discharge from last admission, pt has required increased assist in all aspects and falls. Pt presents with deficits in strength, balance, activity tolerance limiting their ability to perform ADL management at baseline level. Pt currently requires increased assist for all aspects of mobility, toileting, bathing, and dressing. Pt declined EOB/OOB 2/2 fatigue but was able to assist in boosting herself up in the bed with heavy assist from bed positioning. Pt would benefit from skilled OT services to address noted impairments and functional limitations (see below for any additional details) in order to maximize safety and independence while minimizing future risk of falls, injury, and readmission. Anticipate the need for follow up OT services upon acute hospital DC.    If plan is discharge home, recommend the  following:   A lot of help with walking and/or transfers;A lot of help with bathing/dressing/bathroom;Assistance with cooking/housework;Assist for transportation;Help with stairs or ramp for entrance;Direct supervision/assist for medications management     Functional Status Assessment   Patient has had a recent decline in their functional status and demonstrates the ability to make significant improvements in function in a reasonable and predictable amount of time.     Equipment Recommendations   Other (comment) (defer)     Recommendations for Other Services         Precautions/Restrictions   Precautions Precautions: Fall Recall of Precautions/Restrictions: Intact Restrictions Weight Bearing Restrictions Per Provider Order: No     Mobility Bed Mobility Overal bed mobility: Needs Assistance             General bed mobility comments: pt required MIN A for boosting up in bed with assist from headboard and bed in trendelenburg position, anticipate MAX A without bed supports, pt declined EOB/OOB 2/2 fatigue    Transfers                          Balance                                           ADL either performed or assessed with clinical judgement   ADL Overall ADL's : Needs assistance/impaired Eating/Feeding: Bed level;Sitting;Set up Eating/Feeding Details (indicate cue type and reason): setup bed level  Vision         Perception         Praxis         Pertinent Vitals/Pain Pain Assessment Pain Assessment: No/denies pain     Extremity/Trunk Assessment Upper Extremity Assessment Upper Extremity Assessment: Generalized weakness   Lower Extremity Assessment Lower Extremity Assessment: Generalized weakness       Communication Communication Communication: Impaired Factors Affecting Communication: Reduced clarity of speech   Cognition Arousal:  Alert Behavior During Therapy: WFL for tasks assessed/performed Cognition: No apparent impairments                               Following commands: Intact Following commands impaired: Follows one step commands with increased time     Cueing  General Comments   Cueing Techniques: Verbal cues      Exercises     Shoulder Instructions      Home Living Family/patient expects to be discharged to:: Private residence Living Arrangements: Parent;Other relatives Available Help at Discharge: Family;Available PRN/intermittently Type of Home: House Home Access: Level entry     Home Layout: One level     Bathroom Shower/Tub: Producer, Television/film/video: Handicapped height     Home Equipment: Cane - single Librarian, Academic (2 wheels);Shower seat;Grab bars - toilet;Grab bars - tub/shower          Prior Functioning/Environment Prior Level of Function : Independent/Modified Independent;Driving;History of Falls (last six months)             Mobility Comments: per chart review, MOD I with RW since R hip sx, reports recent weakness with difficulties in household amb; has required increased assist since discharge home from last admission as well as falls ADLs Comments: per chart review MOD I for ADL at baseline, PRN assist for IADL but she helps her mother with her IADLs; has required increased assist since discharge home from last admission    OT Problem List: Decreased strength;Decreased activity tolerance;Decreased knowledge of use of DME or AE;Impaired balance (sitting and/or standing);Decreased coordination;Impaired UE functional use   OT Treatment/Interventions: Self-care/ADL training;Therapeutic exercise;Energy conservation;DME and/or AE instruction;Therapeutic activities;Neuromuscular education;Modalities;Patient/family education;Balance training      OT Goals(Current goals can be found in the care plan section)   Acute Rehab OT Goals Patient  Stated Goal: get stronger OT Goal Formulation: With patient Time For Goal Achievement: 10/19/24 Potential to Achieve Goals: Good ADL Goals Pt Will Perform Grooming: sitting;with set-up;with supervision Pt Will Transfer to Toilet: bedside commode;stand pivot transfer;with min assist (LRAD) Pt Will Perform Toileting - Clothing Manipulation and hygiene: sitting/lateral leans;sit to/from stand;with contact guard assist   OT Frequency:  Min 2X/week    Co-evaluation              AM-PAC OT 6 Clicks Daily Activity     Outcome Measure Help from another person eating meals?: None Help from another person taking care of personal grooming?: A Little Help from another person toileting, which includes using toliet, bedpan, or urinal?: A Lot Help from another person bathing (including washing, rinsing, drying)?: A Lot Help from another person to put on and taking off regular upper body clothing?: A Lot Help from another person to put on and taking off regular lower body clothing?: A Lot 6 Click Score: 15   End of Session    Activity Tolerance: Patient limited by fatigue Patient left: in bed;with call bell/phone within reach;with bed alarm set  OT Visit Diagnosis: Other abnormalities of gait and mobility (R26.89);Muscle weakness (generalized) (M62.81)                Time: 8392-8380 OT Time Calculation (min): 12 min Charges:  OT General Charges $OT Visit: 1 Visit OT Evaluation $OT Eval Moderate Complexity: 1 Mod  Warren SAUNDERS., MPH, MS, OTR/L ascom [phone] 10/05/24, 4:46 PM

## 2024-10-06 DIAGNOSIS — I951 Orthostatic hypotension: Secondary | ICD-10-CM

## 2024-10-06 DIAGNOSIS — E440 Moderate protein-calorie malnutrition: Secondary | ICD-10-CM

## 2024-10-06 DIAGNOSIS — D649 Anemia, unspecified: Secondary | ICD-10-CM

## 2024-10-06 DIAGNOSIS — R600 Localized edema: Secondary | ICD-10-CM

## 2024-10-06 DIAGNOSIS — G20A1 Parkinson's disease without dyskinesia, without mention of fluctuations: Secondary | ICD-10-CM

## 2024-10-06 MED ORDER — CARBIDOPA-LEVODOPA 25-100 MG PO TABS
1.5000 | ORAL_TABLET | Freq: Three times a day (TID) | ORAL | Status: DC
  Administered 2024-10-06 – 2024-10-11 (×16): 1.5 via ORAL
  Filled 2024-10-06 (×16): qty 2

## 2024-10-06 NOTE — Assessment & Plan Note (Signed)
 Continue Sinemet .

## 2024-10-06 NOTE — Evaluation (Signed)
 Physical Therapy Evaluation Patient Details Name: Michele Brewer MRN: [id number] DOB: [birth year] Today's Date: 10/06/2024  History of Present Illness  74 y.o. female with medical history significant for Parkinson's disease, depression who came into ED complaining of generalized weakness, fall and not able to be taken care of at home.  Patient was recently admitted and discharged from [facility] between 09/16/2024 and 09/26/2023.  Patient was being planned to transfer to SNF during that admission but insurance denied and patient was discharged home. Patient stated she was living with her mother and sister. She was trying to get up but was not able to do so rather she fell landing on her knees.  She denies hitting her head.  Denies any trauma to her knees and other part of the body.  EMS was called and brought into the emergency room at [facility]  Clinical Impression  Patient admitted with the above. PTA, patient lives with sister and mother and was ambulatory with RW and typically modI with ADLs. Patient presents with weakness, impaired balance, and decreased activity tolerance. Patient stood from recliner with CGA. Ambulated 87' with RW and CGA-supervision, demonstrates decreased foot clearance bilaterally and shuffling. Patient will benefit from skilled PT services during acute stay to address listed deficits. Patient will benefit from ongoing therapy at discharge to maximize functional independence and safety.         If plan is discharge home, recommend the following: A little help with walking and/or transfers;A little help with bathing/dressing/bathroom;Assistance with cooking/housework;Assist for transportation;Help with stairs or ramp for entrance   Can travel by private vehicle        Equipment Recommendations BSC/3in1  Recommendations for Other Services       Functional Status Assessment Patient has had a recent decline in their functional status and/or demonstrates  limited ability to make significant improvements in function in a reasonable and predictable amount of time     Precautions / Restrictions Precautions Precautions: Fall Recall of Precautions/Restrictions: Intact Restrictions Weight Bearing Restrictions Per Provider Order: No      Mobility  Bed Mobility               General bed mobility comments: in recliner pre and post session    Transfers Overall transfer level: Needs assistance Equipment used: Rolling Natanel Snavely (2 wheels) Transfers: Sit to/from Stand Sit to Stand: Contact guard assist                Ambulation/Gait Ambulation/Gait assistance: Contact guard assist, Supervision Gait Distance (Feet): 75 Feet Assistive device: Rolling Jamilet Ambroise (2 wheels) Gait Pattern/deviations: Step-through pattern, Decreased step length - right, Decreased step length - left, Decreased stride length, Shuffle, Decreased dorsiflexion - right, Decreased dorsiflexion - left Gait velocity: decreased     General Gait Details: slow, steady ambulation, decreased B foot clearance  Stairs            Wheelchair Mobility     Tilt Bed    Modified Rankin (Stroke Patients Only)       Balance Overall balance assessment: Needs assistance Sitting-balance support: No upper extremity supported, Feet supported Sitting balance-Leahy Scale: Good     Standing balance support: Bilateral upper extremity supported, During functional activity, Reliant on assistive device for balance Standing balance-Leahy Scale: Fair                               Pertinent Vitals/Pain Pain Assessment Pain Assessment: No/denies pain  Home Living Family/patient expects to be discharged to:: Private residence Living Arrangements: Parent;Other relatives Available Help at Discharge: Family;Available PRN/intermittently Type of Home: House Home Access: Level entry       Home Layout: One level Home Equipment: Cane - single Social Worker (2 wheels);Shower seat;Grab bars - toilet;Grab bars - tub/shower      Prior Function Prior Level of Function : Independent/Modified Independent;Driving;History of Falls (last six months)             Mobility Comments: per chart review, MOD I with RW since R hip sx, reports recent weakness with difficulties in household amb; has required increased assist since discharge home from last admission as well as falls ADLs Comments: per chart review MOD I for ADL at baseline, PRN assist for IADL but she helps her mother with her IADLs; has required increased assist since discharge home from last admission     Extremity/Trunk Assessment   Upper Extremity Assessment Upper Extremity Assessment: Generalized weakness    Lower Extremity Assessment Lower Extremity Assessment: Generalized weakness    Cervical / Trunk Assessment Cervical / Trunk Assessment: Kyphotic  Communication   Communication Communication: Impaired Factors Affecting Communication: Reduced clarity of speech    Cognition Arousal: Alert Behavior During Therapy: WFL for tasks assessed/performed   PT - Cognitive impairments: No apparent impairments                         Following commands: Intact Following commands impaired: Follows one step commands with increased time     Cueing       General Comments      Exercises     Assessment/Plan    PT Assessment Patient needs continued PT services  PT Problem List Decreased strength;Decreased activity tolerance;Decreased balance;Decreased mobility;Decreased knowledge of use of DME       PT Treatment Interventions DME instruction;Gait training;Functional mobility training;Therapeutic activities;Therapeutic exercise;Balance training;Patient/family education    PT Goals (Current goals can be found in the Care Plan section)  Acute Rehab PT Goals Patient Stated Goal: To get stronger PT Goal Formulation: With patient Time For Goal Achievement:  10/20/24 Potential to Achieve Goals: Good    Frequency Min 2X/week     Co-evaluation               AM-PAC PT 6 Clicks Mobility  Outcome Measure Help needed turning from your back to your side while in a flat bed without using bedrails?: A Little Help needed moving from lying on your back to sitting on the side of a flat bed without using bedrails?: A Little Help needed moving to and from a bed to a chair (including a wheelchair)?: A Little Help needed standing up from a chair using your arms (e.g., wheelchair or bedside chair)?: A Little Help needed to walk in hospital room?: A Little Help needed climbing 3-5 steps with a railing? : A Lot 6 Click Score: 17    End of Session Equipment Utilized During Treatment: Gait belt Activity Tolerance: Patient tolerated treatment well Patient left: in chair;with call bell/phone within reach Nurse Communication: Mobility status PT Visit Diagnosis: Muscle weakness (generalized) (M62.81)    Time: 1010-1034 PT Time Calculation (min) (ACUTE ONLY): 24 min   Charges:   PT Evaluation $PT Eval Moderate Complexity: 1 Mod PT Treatments $Therapeutic Activity: 8-22 mins PT General Charges $$ ACUTE PT VISIT: 1 Visit         Maryanne Finder, PT, DPT Physical Therapist - [facility]   Keyanna Sandefer A Evona Westra 10/06/2024, 1:28 PM

## 2024-10-06 NOTE — Plan of Care (Signed)
  Problem: Clinical Measurements: Goal: Ability to maintain clinical measurements within normal limits will improve Outcome: Progressing   Problem: Clinical Measurements: Goal: Diagnostic test results will improve Outcome: Progressing   Problem: Activity: Goal: Risk for activity intolerance will decrease Outcome: Progressing   Problem: Nutrition: Goal: Adequate nutrition will be maintained Outcome: Progressing   Problem: Coping: Goal: Level of anxiety will decrease Outcome: Progressing

## 2024-10-06 NOTE — Hospital Course (Signed)
 74 y.o. female with medical history significant for Parkinson's disease, depression who came into ED complaining of generalized weakness, fall and not able to be taken care of at home.  Patient was recently admitted and discharged from [facility] between 09/16/2024 and 09/26/2023.  Patient was being planned to transfer to skilled nursing facility during that admission but there was denial from the insurance and patient was discharged home.  Patient stated that at home she was living with her mother and sister.  She was trying to get up but was not able to do so rather she fell landing on her knees.  She denies hitting her head.  Denies any trauma to her knees and other part of the body.  EMS was called and brought into the emergency room at [facility].  Patient denies any fever, chills, nausea, vomiting, chest pain, cough, shortness of breath, palpitations.   ED Course: Upon arrival to the ED, patient is found to be tachycardic at 109, CBC and BMP unremarkable.  No imaging studies was done.  Hospitalist service was consulted for evaluation for admission for generalized weakness and fall at home.  1/19.  Patient nervous about going home today.  Did okay with physical therapy.  Continue midodrine  and Sinemet . 1/20.  Needed help with the hospital bed rail in order to pull herself out of bed today with Occupational Therapy.  Physical therapy changed recommendations to rehab.

## 2024-10-06 NOTE — Assessment & Plan Note (Signed)
"   Continue Lasix .         "

## 2024-10-06 NOTE — TOC CM/SW Note (Signed)
 Transition of Care (TOC) CM/SW Note   .Patient is not able to walk the distance required to go the bathroom, or he/she is unable to safely negotiate stairs required to access the bathroom.  A 3in1 BSC will alleviate this problem

## 2024-10-06 NOTE — Progress Notes (Signed)
 " Progress Note   Patient: Michele Brewer FMW:[id number] DOB: [birth year] DOA: 10/05/2024     0 DOS: the patient was seen and examined on 10/06/2024   Brief hospital course: 74 y.o. female with medical history significant for Parkinson's disease, depression who came into ED complaining of generalized weakness, fall and not able to be taken care of at home.  Patient was recently admitted and discharged from [facility] between 09/16/2024 and 09/26/2023.  Patient was being planned to transfer to skilled nursing facility during that admission but there was denial from the insurance and patient was discharged home.  Patient stated that at home she was living with her mother and sister.  She was trying to get up but was not able to do so rather she fell landing on her knees.  She denies hitting her head.  Denies any trauma to her knees and other part of the body.  EMS was called and brought into the emergency room at [facility]  Patient denies any fever, chills, nausea, vomiting, chest pain, cough, shortness of breath, palpitations.   ED Course: Upon arrival to the ED, patient is found to be tachycardic at 109, CBC and BMP unremarkable.  No imaging studies was done.  Hospitalist service was consulted for evaluation for admission for generalized weakness and fall at home.  1/19.  Patient nervous about going home today.  Did okay with physical therapy.  Continue midodrine  and Sinemet .  Assessment and Plan: * Generalized weakness Continue working with PT and OT.  Will set up home health PT OT RN and aide.  Patient had a fall at home.  Orthostatic hypotension Continue midodrine   Lower extremity edema Continue Lasix   Protein-calorie malnutrition, moderate Continue supplements  Parkinson disease (HCC) Continue Sinemet   Chronic anemia Last hemoglobin 11.4        Subjective: Patient nervous about going home today.  Had a fall at home.  We will set up home health.  Admitted with  generalized weakness  Physical Exam: Vitals:   10/05/24 1540 10/05/24 2047 10/06/24 0321 10/06/24 0844  BP: 132/62 120/61 126/66 (!) 140/72  Pulse: 84 72 71 80  Resp: 12 16 18 16   Temp: 98.6 F (37 C) 98.1 F (36.7 C) 98.1 F (36.7 C) 98.1 F (36.7 C)  TempSrc: Oral     SpO2: 99% 96% 95% 97%  Weight:      Height:       Physical Exam HENT:     Head: Normocephalic.  Eyes:     General: Lids are normal.  Cardiovascular:     Rate and Rhythm: Normal rate and regular rhythm.     Heart sounds: Normal heart sounds, S1 normal and S2 normal.  Pulmonary:     Breath sounds: No decreased breath sounds, wheezing, rhonchi or rales.  Abdominal:     Palpations: Abdomen is soft.     Tenderness: There is no abdominal tenderness.  Musculoskeletal:     Right lower leg: Swelling present.     Left lower leg: Swelling present.  Skin:    General: Skin is warm.     Findings: No rash.  Neurological:     Mental Status: She is alert and oriented to person, place, and time.     Data Reviewed: Creatinine 0.84, white blood cell count 5.0, hemoglobin 11.4, platelet count 191  Family Communication: Spoke with friend on the phone  Disposition: Status is: Observation Patient nervous going home today.  Will discharge home tomorrow.  Resume home  health services.  Planned Discharge Destination: Home with Home Health    Time spent: 28 minutes  Author: Charlie Patterson, MD 10/06/2024 12:31 PM  For on call review [email or web address].  "

## 2024-10-06 NOTE — Assessment & Plan Note (Signed)
 Continue supplements

## 2024-10-06 NOTE — Assessment & Plan Note (Signed)
 Continue midodrine 

## 2024-10-06 NOTE — Care Management Obs Status (Signed)
 MEDICARE OBSERVATION STATUS NOTIFICATION   Patient Details  Name: Michele Brewer MRN: [id number] Date of Birth: 1950/12/06   Medicare Observation Status Notification Given:  Chaney BRANDY CHRISTIANE LELON, CMA 10/06/2024, 3:53 PM

## 2024-10-06 NOTE — Assessment & Plan Note (Signed)
 Last hemoglobin 11.4

## 2024-10-06 NOTE — Assessment & Plan Note (Signed)
 Continue working with PT and OT.  Will set up home health PT OT RN and aide.  Patient had a fall at home.

## 2024-10-07 MED ORDER — FUROSEMIDE 20 MG PO TABS
20.0000 mg | ORAL_TABLET | Freq: Two times a day (BID) | ORAL | Status: DC
  Administered 2024-10-07 – 2024-10-09 (×5): 20 mg via ORAL
  Filled 2024-10-07 (×6): qty 1

## 2024-10-07 NOTE — TOC Progression Note (Addendum)
 Transition of Care [facility]) - Progression Note    Patient Details  Name: Michele Brewer MRN: [id number] Date of Birth: 10-Apr-1951  Transition of Care [facility]) CM/SW Contact  [name], RN Phone Number: 10/07/2024, 10:25 AM  Clinical Narrative:     Spoke with patient regarding at home hospital bed. Patient verbalized that she is unsure who/where the bed came from as she came back from the hospital to live with her sister and the room was already set up. Patient verbalized permission for this CM to request hospital bed order to be filled with [facility]. This CM advised patient I will keep her updated, she verbalized agreement to this plan.   Notified Mitch with [employer], order is in and note has been sent for co-sign.   UPDATE 1:47pm-   This CM notified by Mitch with [employer], hospital bed approved however patient states that current bed at home will have to be dismantled and removed prior to [employer] delivering and setting up new bed.   Met with patient, she stated to this CM that a friend gave her the bed at home and that her friend's husband is going to go look at bed and see if he can fix it before removing it. Patient is unable to tell me when he will be going to look at the bed. This CM explained to patient that therapy is recommending a working bed be in place before she is discharged home however, once she is medically ready for discharge we have to have a plan in place for that. Patient verbalized understanding and advised this CM she will notify me as soon as she hears from her friend. This CM documented my name and phone number to patient's dry erase board on the wall.                     Expected Discharge Plan and Services                                               Social Drivers of Health (SDOH) Interventions SDOH Screenings   Food Insecurity: No Food Insecurity (10/05/2024)  Housing: Low Risk (10/05/2024)  Transportation Needs: No Transportation Needs (10/05/2024)   Utilities: Not At Risk (10/05/2024)  Financial Resource Strain: Low Risk  (06/12/2024)   Received from [facility]  Social Connections: Moderately Integrated (10/05/2024)  Recent Concern: Social Connections - Moderately Isolated (08/18/2024)  Tobacco Use: Low Risk (10/05/2024)    Readmission Risk Interventions     No data to display

## 2024-10-07 NOTE — TOC CM/SW Note (Signed)
 Patient has Parkinson disease which requires patient's body to be positioned in ways not feasible with a normal bed. Head must be elevated at greater than 30 degrees. Parkinson disease requires frequent changes in body position upon wakening and throughout the day which cannot be achieved with a normal bed.

## 2024-10-07 NOTE — TOC Progression Note (Signed)
 Transition of Care [facility]) - Progression Note    Patient Details  Name: Michele Brewer MRN: [id number] Date of Birth: April 03, 1951  [facility] CM/SW Contact  [name], RN Phone Number: 10/07/2024, 3:33 PM  Clinical Narrative:     Notified by PT that patient appropriate for SNF-STR and that patient verbalized she would like to see if insurance will approve SNF-STR.                     Expected Discharge Plan and Services                                               Social Drivers of Health (SDOH) Interventions SDOH Screenings   Food Insecurity: No Food Insecurity (10/05/2024)  Housing: Low Risk (10/05/2024)  Transportation Needs: No Transportation Needs (10/05/2024)  Utilities: Not At Risk (10/05/2024)  Financial Resource Strain: Low Risk  (06/12/2024)   Received from [facility]  Social Connections: Moderately Integrated (10/05/2024)  Recent Concern: Social Connections - Moderately Isolated (08/18/2024)  Tobacco Use: Low Risk (10/05/2024)    Readmission Risk Interventions     No data to display

## 2024-10-07 NOTE — Progress Notes (Signed)
 Occupational Therapy Treatment Patient Details Name: Michele Brewer MRN: [id number] DOB: May 01, 1951 Today's Date: 10/07/2024   History of present illness 74 y.o. female with medical history significant for Parkinson's disease, depression who came into ED complaining of generalized weakness, fall and not able to be taken care of at home.  Patient was recently admitted and discharged from [facility] between 09/16/2024 and 09/26/2023.  Patient was being planned to transfer to SNF during that admission but insurance denied and patient was discharged home. Patient stated she was living with her mother and sister. She was trying to get up but was not able to do so rather she fell landing on her knees.  She denies hitting her head.  Denies any trauma to her knees and other part of the body.  EMS was called and brought into the emergency room at [facility].   OT comments  Per MD request, complete an assessment this AM to determine what level of assistance pt requires for completing ADLs, to determine whether pt can safely discharge home or whether she requires SNF-level care. Pt demonstrates generalized weakness and limited endurance; however, she is able to complete out-of-bed tasks without physical assistance from therapist, but requires extra time and effort and frequent rest breaks. Pt transferred from EOB to BSC, using RW and step-pivot, with SUPV. She completed pericare/clothing manipulation in standing, ambulated with RW without any unsteadiness or LOB, although did endorse at one point feeling a little light-headed, which cleared following an extended standing rest break. Pt completed grooming in standing at sink w/o physical assistance and without UE support on RW or sink countertop. Despite repeated attempts, however, pt was not able to [facility] transfer supine<sit from a flat bed. With head of hospital bed elevated, pt could come into sitting EOB with extra time and effort but no physical  assistance from therapist. Anticipate that pt could discharge home with [facility] if she has a working hospital bed. Pt reports that a friend of hers recently donated a hospital bed to pt, but that the function to raise the [facility] is not working on this donated bed. Request that a functioning hospital bed be delivered and set up in pt's home prior to pt's DC home (or on day she DCs home). Without a functioning hospital pt that allows pt to raise the [facility], pt does not have the strength at present to get in and out of bed; in that case, she would need to discharge to a rehab facility to build strength, flexibility, and to master independent supine<>sit transfers.       If plan is discharge home, recommend the following:  A little help with bathing/dressing/bathroom;A little help with walking and/or transfers;Other (comment) (hospital bed)   Equipment Recommendations  Hospital bed    Recommendations for Other Services      Precautions / Restrictions Precautions Precautions: Fall Restrictions Weight Bearing Restrictions Per Provider Order: No       Mobility Bed Mobility Overal bed mobility: Needs Assistance Bed Mobility: Supine to Sit     Supine to sit: Used rails, Supervision, Max assist     General bed mobility comments: transfering sit to supine -- Max A from flat bed; SUPV with HOB elevated    Transfers Overall transfer level: Needs assistance Equipment used: Rolling walker (2 wheels) Transfers: Sit to/from Stand Sit to Stand: Supervision           General transfer comment: sit<stand with increased time and effort, RW; no physical assistance from therapist  required     Balance Overall balance assessment: Needs assistance Sitting-balance support: No upper extremity supported, Feet supported Sitting balance-Leahy Scale: Good     Standing balance support: Bilateral upper extremity supported, During functional activity, Reliant on assistive device for balance Standing  balance-Leahy Scale: Good Standing balance comment: no LOB with ambulation using RW                           ADL either performed or assessed with clinical judgement   ADL Overall ADL's : Needs assistance/impaired Eating/Feeding: Set up;Sitting   Grooming: Standing;Wash/dry face;Oral care;Wash/dry hands;Supervision/safety               Lower Body Dressing: Maximal assistance Lower Body Dressing Details (indicate cue type and reason): donning and doffing socks Toilet Transfer: Supervision/safety;BSC/3in1;Rolling walker (2 wheels);Ambulation Toilet Transfer Details (indicate cue type and reason): SUPV for safety, but no physical assistance nor cueing required Toileting- Clothing Manipulation and Hygiene: Modified independent;Sit to/from stand              Extremity/Trunk Assessment              Vision       Perception     Praxis     Communication     Cognition Arousal: Alert Behavior During Therapy: WFL for tasks assessed/performed Cognition: No apparent impairments                                        Cueing      Exercises Other Exercises Other Exercises: Educ re: bed level exercises to reduce AM stiffness prior to supine<sit    Shoulder Instructions       General Comments      Pertinent Vitals/ Pain       Pain Assessment Pain Assessment: No/denies pain  Home Living                                          Prior Functioning/Environment              Frequency  Min 2X/week        Progress Toward Goals  OT Goals(current goals can now be found in the care plan section)  Progress towards OT goals: Progressing toward goals     Plan      Co-evaluation                 AM-PAC OT 6 Clicks Daily Activity     Outcome Measure   Help from another person eating meals?: None Help from another person taking care of personal grooming?: A Little Help from another person toileting,  which includes using toliet, bedpan, or urinal?: A Little Help from another person bathing (including washing, rinsing, drying)?: A Lot Help from another person to put on and taking off regular upper body clothing?: A Little Help from another person to put on and taking off regular lower body clothing?: A Lot 6 Click Score: 17    End of Session Equipment Utilized During Treatment: Rolling walker (2 wheels)  OT Visit Diagnosis: Other abnormalities of gait and mobility (R26.89);Muscle weakness (generalized) (M62.81)   Activity Tolerance Patient tolerated treatment well   Patient Left in chair;with call bell/phone within reach;with chair alarm set   Nurse  Communication Mobility status        Time: 9152-9042 OT Time Calculation (min): 70 min  Charges: OT General Charges $OT Visit: 1 Visit OT Treatments $Self Care/Home Management : 68-82 mins  Suzen Hock, PhD, MS, OTR/L 10/07/24, 10:28 AM

## 2024-10-07 NOTE — Progress Notes (Signed)
 Physical Therapy Treatment Patient Details Name: Michele Brewer MRN: [id number] DOB: 1951/03/01 Today's Date: 10/07/2024   History of Present Illness 74 y.o. female with medical history significant for Parkinson's disease, depression who came into ED complaining of generalized weakness, fall and not able to be taken care of at home.  Patient was recently admitted and discharged from [facility] between 09/16/2024 and 09/26/2023.  Patient was being planned to transfer to SNF during that admission but insurance denied and patient was discharged home. Patient stated she was living with her mother and sister. She was trying to get up but was not able to do so rather she fell landing on her knees.  She denies hitting her head.  Denies any trauma to her knees and other part of the body.  EMS was called and brought into the emergency room at [facility].    PT Comments  Pt ready and motivated for session. She is unable to stand on her own without doing seated ROM ex prior to attempts.  She self initiates and stated she feels stiff from sitting too long.  After ROM, she is able to stand to RW with cga x 1 and transfer to bed.  Increased time spent on bed mobility with and without bed features.  Bed features do help somewhat but pt continues to struggle with getting her legs onto the bed either by trying to bring them straight up or but going into side lying then trying to put them up.  She does state she uses a chair at the end of her bed and she is able to get her feet onto it then work them into bed.  Simulated by chair at end of bed and it does improve her ability but remains quite cumbersome and time consuming.  After several trials, she is able to walk unit with RW and slow gait then return to room for brushing her teeth and washing face.  She opts to return to bed after gait and again struggles to get into bed despite bed features.  She is unable to manage her blankets.  Pt stated she has  struggled with bed mobility since discharge on 1/8.  She stated she often finds it too hard and ends up sitting in chair with her upper body on the bed to go to sleep.  Pt was denied rehab previously by her insurance but would like to explore the option again.  Given another recent admission and continued difficulties with bed mobility despite bed features, rehab would be appropriate for her if available.  Will update discharge recommendations.  Will try to increase mobility team in house in case rehab is not an option for her.   If plan is discharge home, recommend the following: A little help with walking and/or transfers;A little help with bathing/dressing/bathroom;Assistance with cooking/housework;Assist for transportation;Help with stairs or ramp for entrance   Can travel by private vehicle        Equipment Recommendations  BSC/3in1    Recommendations for Other Services       Precautions / Restrictions Precautions Precautions: Fall Recall of Precautions/Restrictions: Intact Restrictions Weight Bearing Restrictions Per Provider Order: No     Mobility  Bed Mobility Overal bed mobility: Needs Assistance Bed Mobility: Supine to Sit, Sit to Supine     Supine to sit: HOB elevated, Used rails Sit to supine: Min assist, HOB elevated, Used rails     Patient Response: Cooperative  Transfers Overall transfer level: Needs assistance Equipment used: Rolling  walker (2 wheels) Transfers: Sit to/from Stand Sit to Stand: Contact guard assist                Ambulation/Gait Ambulation/Gait assistance: Contact guard assist, Supervision Gait Distance (Feet): 160 Feet Assistive device: Rolling walker (2 wheels) Gait Pattern/deviations: Step-through pattern, Decreased step length - right, Decreased step length - left, Decreased stride length, Shuffle, Decreased dorsiflexion - right, Decreased dorsiflexion - left Gait velocity: decreased         Stairs              Wheelchair Mobility     Tilt Bed Tilt Bed Patient Response: Cooperative  Modified Rankin (Stroke Patients Only)       Balance Overall balance assessment: Needs assistance Sitting-balance support: No upper extremity supported, Feet supported Sitting balance-Leahy Scale: Good     Standing balance support: Bilateral upper extremity supported, During functional activity, Reliant on assistive device for balance Standing balance-Leahy Scale: Good Standing balance comment: no LOB with ambulation using RW                            Communication Communication Communication: Impaired Factors Affecting Communication: Reduced clarity of speech  Cognition Arousal: Alert Behavior During Therapy: WFL for tasks assessed/performed   PT - Cognitive impairments: No apparent impairments                       PT - Cognition Comments: remains highly motivated with mobility Following commands: Intact Following commands impaired: Follows one step commands with increased time    Cueing Cueing Techniques: Verbal cues  Exercises Other Exercises Other Exercises: stands to brush teeth and wash face at sink    General Comments        Pertinent Vitals/Pain Pain Assessment Pain Assessment: No/denies pain    Home Living                          Prior Function            PT Goals (current goals can now be found in the care plan section) Progress towards PT goals: Progressing toward goals    Frequency    Min 2X/week      PT Plan      Co-evaluation              AM-PAC PT 6 Clicks Mobility   Outcome Measure  Help needed turning from your back to your side while in a flat bed without using bedrails?: A Little Help needed moving from lying on your back to sitting on the side of a flat bed without using bedrails?: A Little Help needed moving to and from a bed to a chair (including a wheelchair)?: A Little Help needed standing up from a  chair using your arms (e.g., wheelchair or bedside chair)?: A Little Help needed to walk in hospital room?: A Little Help needed climbing 3-5 steps with a railing? : A Lot 6 Click Score: 17    End of Session Equipment Utilized During Treatment: Gait belt Activity Tolerance: Patient tolerated treatment well Patient left: with call bell/phone within reach;in bed;with bed alarm set Nurse Communication: Mobility status PT Visit Diagnosis: Muscle weakness (generalized) (M62.81)     Time: 8645-8554 PT Time Calculation (min) (ACUTE ONLY): 51 min  Charges:    $Gait Training: 23-37 mins $Therapeutic Activity: 8-22 mins PT General Charges $$ ACUTE PT VISIT: 1  Visit                   Lauraine Gills, PTA 10/07/24, 3:07 PM

## 2024-10-07 NOTE — Progress Notes (Signed)
 " Progress Note   Patient: Michele Brewer FMW:[id number] DOB: 20-Jan-1951 DOA: 10/05/2024     0 DOS: the patient was seen and examined on 10/07/2024   Brief hospital course: 74 y.o. female with medical history significant for Parkinson's disease, depression who came into ED complaining of generalized weakness, fall and not able to be taken care of at home.  Patient was recently admitted and discharged from [facility] between 09/16/2024 and 09/26/2023.  Patient was being planned to transfer to skilled nursing facility during that admission but there was denial from the insurance and patient was discharged home.  Patient stated that at home she was living with her mother and sister.  She was trying to get up but was not able to do so rather she fell landing on her knees.  She denies hitting her head.  Denies any trauma to her knees and other part of the body.  EMS was called and brought into the emergency room at [facility].  Patient denies any fever, chills, nausea, vomiting, chest pain, cough, shortness of breath, palpitations.   ED Course: Upon arrival to the ED, patient is found to be tachycardic at 109, CBC and BMP unremarkable.  No imaging studies was done.  Hospitalist service was consulted for evaluation for admission for generalized weakness and fall at home.  1/19.  Patient nervous about going home today.  Did okay with physical therapy.  Continue midodrine  and Sinemet . 1/20.  Needed help with the hospital bed rail in order to pull herself out of bed today with Occupational Therapy.  Physical therapy changed recommendations to rehab.  Assessment and Plan: * Generalized weakness Continue working with PT and OT.  Having difficulty getting out of the bed.  Needed the rail of hospital bed in order to get out of the bed with Occupational Therapy today.  Physical therapy now recommending rehab.  If she goes home will end up needing a functional hospital bed.  TOC to look into  options.  Unsafe discharge currently.  Orthostatic hypotension Continue midodrine   Lower extremity edema Continue Lasix  but increase to twice daily.  Protein-calorie malnutrition, moderate Continue supplements  Parkinson disease (HCC) Continue Sinemet   Chronic anemia Last hemoglobin 11.4        Subjective: Patient seen this morning was not feeling medication.  She had a little bit of a tremor before her Parkinson's medication.  Admitted after a fall.  Physical Exam: Vitals:   10/06/24 2006 10/07/24 0405 10/07/24 0804 10/07/24 1503  BP: (!) 99/51 112/60 115/64 (!) 108/55  Pulse: 78 76 79 63  Resp: 15 16 17 19   Temp: 99.5 F (37.5 C) 98.7 F (37.1 C) 98.3 F (36.8 C) (!) 97.3 F (36.3 C)  TempSrc:   Oral   SpO2: 99% 99% 98% 100%  Weight:      Height:       Physical Exam HENT:     Head: Normocephalic.  Eyes:     General: Lids are normal.  Cardiovascular:     Rate and Rhythm: Normal rate and regular rhythm.     Heart sounds: Normal heart sounds, S1 normal and S2 normal.  Pulmonary:     Breath sounds: No decreased breath sounds, wheezing, rhonchi or rales.  Abdominal:     Palpations: Abdomen is soft.     Tenderness: There is no abdominal tenderness.  Musculoskeletal:     Right lower leg: Swelling present.     Left lower leg: Swelling present.  Skin:  General: Skin is warm.     Findings: No rash.  Neurological:     Mental Status: She is alert and oriented to person, place, and time.     Comments: Resting tremor seen this morning in right hand and right foot     Data Reviewed: Last creatinine 0.84  Family Communication: Spoke with friend yesterday  Disposition: Status is: Observation Physical therapy now recommending rehab.  Needed help with a hospital bed rail in order to get herself out of bed today with Occupational Therapy  Planned Discharge Destination: Potential rehab versus home with home health    Time spent: 29 minutes Case discussed  with TOC, OT, PT and nursing staff  Author: Charlie Patterson, MD 10/07/2024 3:35 PM  For on call review [email or web address].  "

## 2024-10-08 DIAGNOSIS — R531 Weakness: Secondary | ICD-10-CM | POA: Diagnosis not present

## 2024-10-08 LAB — BASIC METABOLIC PANEL WITH GFR
Anion gap: 8 (ref 5–15)
BUN: 16 mg/dL (ref 8–23)
CO2: 29 mmol/L (ref 22–32)
Calcium: 9.5 mg/dL (ref 8.9–10.3)
Chloride: 102 mmol/L (ref 98–111)
Creatinine, Ser: 0.67 mg/dL (ref 0.44–1.00)
GFR, Estimated: >60 mL/min
Glucose, Bld: 101 mg/dL — ABNORMAL HIGH (ref 70–99)
Potassium: 3.7 mmol/L (ref 3.5–5.1)
Sodium: 139 mmol/L (ref 135–145)

## 2024-10-08 LAB — CBC
HCT: 33.5 % — ABNORMAL LOW (ref 36.0–46.0)
Hemoglobin: 11.1 g/dL — ABNORMAL LOW (ref 12.0–15.0)
MCH: 31.2 pg (ref 26.0–34.0)
MCHC: 33.1 g/dL (ref 30.0–36.0)
MCV: 94.1 fL (ref 80.0–100.0)
Platelets: 146 K/uL — ABNORMAL LOW (ref 150–400)
RBC: 3.56 MIL/uL — ABNORMAL LOW (ref 3.87–5.11)
RDW: 14.5 % (ref 11.5–15.5)
WBC: 3.7 K/uL — ABNORMAL LOW (ref 4.0–10.5)
nRBC: 0 % (ref 0.0–0.2)

## 2024-10-08 LAB — MAGNESIUM: Magnesium: 2 mg/dL (ref 1.7–2.4)

## 2024-10-08 MED ORDER — METHOCARBAMOL 500 MG PO TABS
500.0000 mg | ORAL_TABLET | Freq: Once | ORAL | Status: DC
  Filled 2024-10-08: qty 1

## 2024-10-08 NOTE — Progress Notes (Signed)
 Physical Therapy Treatment Patient Details Name: Michele Brewer MRN: [id number] DOB: Jan 15, 1951 Today's Date: 10/08/2024   History of Present Illness 74 y.o. female with medical history significant for Parkinson's disease, depression who came into ED complaining of generalized weakness, fall and not able to be taken care of at home.  Patient was recently admitted and discharged from [facility] between 09/16/2024 and 09/26/2023.  Patient was being planned to transfer to SNF during that admission but insurance denied and patient was discharged home. Patient stated she was living with her mother and sister. She was trying to get up but was not able to do so rather she fell landing on her knees.  She denies hitting her head.  Denies any trauma to her knees and other part of the body.  EMS was called and brought into the emergency room at [facility].    PT Comments  Overall good tolerance for PT session. Pt without dizziness with positional changes, able to stand from various surfaces with CGA. Gait training in hall with RW and CG/S, slow steady gait, decreased foot clearance, but cautious. Pt stood at sink with single UE support to complete reaching dynamic tasks with CGA. Currently planning for STR. Will continue to focus on set goals and progressing towards baseline LOF.    If plan is discharge home, recommend the following: A little help with walking and/or transfers;A little help with bathing/dressing/bathroom;Assistance with cooking/housework;Assist for transportation;Help with stairs or ramp for entrance   Can travel by private vehicle     Yes  Equipment Recommendations  BSC/3in1;Hospital bed    Recommendations for Other Services       Precautions / Restrictions Precautions Precautions: Fall Recall of Precautions/Restrictions: Intact Restrictions Weight Bearing Restrictions Per Provider Order: No     Mobility  Bed Mobility               General bed mobility  comments:  (NT)    Transfers Overall transfer level: Needs assistance Equipment used: Rolling walker (2 wheels) Transfers: Sit to/from Stand Sit to Stand: Contact guard assist           General transfer comment: sit<stand with increased time and effort, RW; no physical assistance from therapist required    Ambulation/Gait Ambulation/Gait assistance: Contact guard assist, Supervision Gait Distance (Feet): 125 Feet Assistive device: Rolling walker (2 wheels) Gait Pattern/deviations: Step-through pattern, Decreased step length - right, Decreased step length - left, Decreased stride length, Shuffle, Decreased dorsiflexion - right, Decreased dorsiflexion - left Gait velocity: decreased     General Gait Details: slow, steady ambulation, decreased B foot clearance   Stairs             Wheelchair Mobility     Tilt Bed    Modified Rankin (Stroke Patients Only)       Balance Overall balance assessment: Needs assistance Sitting-balance support: No upper extremity supported, Feet supported Sitting balance-Leahy Scale: Good     Standing balance support: Bilateral upper extremity supported, During functional activity, Reliant on assistive device for balance Standing balance-Leahy Scale: Good Standing balance comment: no LOB with ambulation using RW                            Communication Communication Communication: Impaired Factors Affecting Communication: Reduced clarity of speech  Cognition Arousal: Alert Behavior During Therapy: WFL for tasks assessed/performed   PT - Cognitive impairments: No apparent impairments  PT - Cognition Comments: remains highly motivated with mobility Following commands: Intact Following commands impaired: Follows one step commands with increased time    Cueing Cueing Techniques: Verbal cues  Exercises      General Comments        Pertinent Vitals/Pain Pain Assessment Pain  Assessment: No/denies pain    Home Living                          Prior Function            PT Goals (current goals can now be found in the care plan section) Acute Rehab PT Goals Patient Stated Goal: To get stronger Progress towards PT goals: Progressing toward goals    Frequency    Min 2X/week      PT Plan      Co-evaluation              AM-PAC PT 6 Clicks Mobility   Outcome Measure  Help needed turning from your back to your side while in a flat bed without using bedrails?: A Little Help needed moving from lying on your back to sitting on the side of a flat bed without using bedrails?: A Little Help needed moving to and from a bed to a chair (including a wheelchair)?: A Little Help needed standing up from a chair using your arms (e.g., wheelchair or bedside chair)?: A Little Help needed to walk in hospital room?: A Little Help needed climbing 3-5 steps with a railing? : A Lot 6 Click Score: 17    End of Session   Activity Tolerance: Patient tolerated treatment well Patient left: in chair;with call bell/phone within reach;with chair alarm set Nurse Communication: Mobility status PT Visit Diagnosis: Muscle weakness (generalized) (M62.81)     Time: 8845-8771 PT Time Calculation (min) (ACUTE ONLY): 34 min  Charges:    $Gait Training: 8-22 mins $Therapeutic Activity: 8-22 mins PT General Charges $$ ACUTE PT VISIT: 1 Visit                    Darice Bohr, PTA  Darice JAYSON Bohr 10/08/2024, 1:03 PM

## 2024-10-08 NOTE — [place] FL2 (Signed)
 " [place]  MEDICAID FL2 LEVEL OF CARE FORM     IDENTIFICATION  Patient Name: Michele Brewer Birthdate: June 13, 1951 Sex: female Admission Date (Current Location): 10/05/2024  [place] and [name] Number:  Chiropodist and Address:  [facility], [address] [place], [place] [address]      Provider Number: [id number]  Attending Physician Name and Address:  Jerelene Critchley, MD  Relative Name and Phone Number:  Curtistine Molt [phone]    Current Level of Care: Hospital Recommended Level of Care: Skilled Nursing Facility Prior Approval Number:    Date Approved/Denied:   PASRR Number: [id number]  Discharge Plan: SNF    Current Diagnoses: Patient Active Problem List   Diagnosis Date Noted   Failure to thrive in adult 09/16/2024   Orthostatic hypotension 08/24/2024   Lower extremity edema 08/22/2024   Generalized weakness 08/22/2024   Protein-calorie malnutrition, moderate 08/19/2024   UTI (urinary tract infection) 08/19/2024   Chronic anemia 08/19/2024   Syncope 08/18/2024   Rupture of ligament of right wrist 06/18/2024   Closed right hip fracture (HCC) 06/17/2024   COVID-19 virus infection 06/02/2023   Constipation 07/18/2022   Vitamin B12 deficiency 07/16/2022   Vitamin D  deficiency 07/16/2022   Folate deficiency 07/16/2022   Lower extremity weakness 07/15/2022   Neuropathy 07/15/2022   Ambulatory dysfunction 07/14/2022   Hypomagnesemia 05/28/2022   Parkinson disease (HCC) 05/25/2022   Depression 05/25/2022   Frequent falls 02/09/2021   Multiple fractures of ribs, left side, subsequent encounter for fracture with routine healing 02/08/2021   Polyneuropathy, unspecified 02/08/2021   Rhabdomyolysis 02/04/2021   Fall 02/03/2021   Osteopenia 04/07/2020   Major depressive disorder, recurrent, 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    Orientation RESPIRATION BLADDER Height & Weight     Self, Time, Situation, Place  Normal Continent Weight:  63.2 kg Height:  5' 4 (162.6 cm)  BEHAVIORAL SYMPTOMS/MOOD NEUROLOGICAL BOWEL NUTRITION STATUS      Continent Diet (Regular)  AMBULATORY STATUS COMMUNICATION OF NEEDS Skin   Extensive Assist Verbally Normal                       Personal Care Assistance Level of Assistance  Bathing, Feeding, Dressing Bathing Assistance: Maximum assistance Feeding assistance: Limited assistance Dressing Assistance: Maximum assistance     Functional Limitations Info  Sight Sight Info: Impaired (wears glasses)        SPECIAL CARE FACTORS FREQUENCY  PT (By licensed PT), OT (By licensed OT)     PT Frequency: 5x week OT Frequency: 5x week            Contractures Contractures Info: Not present    Additional Factors Info  Code Status, Allergies Code Status Info: FULL Allergies Info: Citrus Medium rash Peanut-containing Drug Products Medium Rash and itch           Current Medications (10/08/2024):  This is the current hospital active medication list Current Facility-Administered Medications  Medication Dose Route Frequency Provider Last Rate Last Admin   acetaminophen  (TYLENOL ) tablet 650 mg  650 mg Oral Q6H PRN Paudel, Keshab, MD   650 mg at 10/05/24 2159   Or   acetaminophen  (TYLENOL ) suppository 650 mg  650 mg Rectal Q6H PRN Roann Gouty, MD       carbidopa -levodopa  (SINEMET  IR) 25-100 MG per tablet immediate release 1.5 tablet  1.5 tablet Oral TID AC Josette Ade, MD   1.5 tablet at 10/08/24 0908   cyanocobalamin  (VITAMIN B12) tablet 1,000 mcg  1,000 mcg Oral Daily Paudel, Keshab, MD   1,000 mcg at 10/08/24 0909   enoxaparin  (LOVENOX ) injection 40 mg  40 mg Subcutaneous Q24H Paudel, Keshab, MD   40 mg at 10/08/24 9090   ferrous sulfate  tablet 325 mg  325 mg Oral Q breakfast Paudel, Keshab, MD   325 mg at 10/08/24 9090   furosemide  (LASIX ) tablet 20 mg  20 mg Oral BID Josette Ade, MD   20 mg at 10/08/24 9090   methocarbamol  (ROBAXIN ) tablet 500 mg  500 mg Oral Once  Foust, Katy L, NP       midodrine  (PROAMATINE ) tablet 5 mg  5 mg Oral TID WC Paudel, Keshab, MD   5 mg at 10/08/24 0909   morphine  (PF) 2 MG/ML injection 2 mg  2 mg Intravenous Q4H PRN Paudel, Keshab, MD       ondansetron  (ZOFRAN ) tablet 4 mg  4 mg Oral Q6H PRN Paudel, Keshab, MD       Or   ondansetron  (ZOFRAN ) injection 4 mg  4 mg Intravenous Q6H PRN Paudel, Keshab, MD   4 mg at 10/06/24 0934   oxyCODONE  (Oxy IR/ROXICODONE ) immediate release tablet 5 mg  5 mg Oral Q4H PRN Paudel, Keshab, MD       polyethylene glycol (MIRALAX  / GLYCOLAX ) packet 17 g  17 g Oral Daily PRN Paudel, Keshab, MD       Vitamin E  CAPS 400 Units  400 Units Oral Daily Paudel, Keshab, MD   400 Units at 10/08/24 9087     Discharge Medications: Please see discharge summary for a list of discharge medications.  Relevant Imaging Results:  Relevant Lab Results:   Additional Information SS# [id number]  [name], RN     "

## 2024-10-08 NOTE — Progress Notes (Signed)
 " Progress Note   Patient: Michele Brewer FMW:[id number] DOB: 1951/05/08 DOA: 10/05/2024     0 DOS: the patient was seen and examined on 10/08/2024   Brief hospital course: 74 y.o. female with medical history significant for Parkinson's disease, depression who came into ED complaining of generalized weakness, fall and not able to be taken care of at home.  Patient was recently admitted and discharged from [facility] between 09/16/2024 and 09/26/2023.  Patient was being planned to transfer to skilled nursing facility during that admission but there was denial from the insurance and patient was discharged home.  Patient stated that at home she was living with her mother and sister.  She was trying to get up but was not able to do so rather she fell landing on her knees.  She denies hitting her head.  Denies any trauma to her knees and other part of the body.  EMS was called and brought into the emergency room at [facility].  Patient denies any fever, chills, nausea, vomiting, chest pain, cough, shortness of breath, palpitations.   Hospitalist service was consulted for evaluation for admission for generalized weakness and fall at home.   Assessment and Plan: Generalized weakness Continue working with PT and OT.  Having difficulty getting out of the bed.   Patient has difficulty with transfer in and out of bed. PT recommended rehab, TOC to look into options. If she goes home will end up needing a functional hospital bed Unsafe discharge currently.   Orthostatic hypotension Continue midodrine    Lower extremity edema Continue Lasix  but increase to twice daily.   Protein-calorie malnutrition, moderate Continue supplements   Parkinson disease (HCC) Continue Sinemet    Chronic anemia Last hemoglobin 11.4    Subjective: Patient was examined at the bedside, new to me today. Reports she is able to walk but has difficulty transfer in and out of bed. TOC looking for rehab  bed  Physical Exam: Vitals:   10/07/24 1503 10/07/24 2030 10/08/24 0526 10/08/24 0741  BP: (!) 108/55 108/65 (!) 103/57 (!) 102/52  Pulse: 63 69 72 66  Resp: 19 18 16 16   Temp: (!) 97.3 F (36.3 C) 97.9 F (36.6 C) 98.3 F (36.8 C) 98.6 F (37 C)  TempSrc: Oral  Oral   SpO2: 100% 99% 100% 100%  Weight:      Height:       Physical Exam HENT:     Head: Normocephalic.  Eyes:     General: Lids are normal.  Cardiovascular:     Rate and Rhythm: Normal rate and regular rhythm.     Heart sounds: Normal heart sounds, S1 normal and S2 normal.  Pulmonary:     Breath sounds: No decreased breath sounds, wheezing, rhonchi or rales.  Abdominal:     Palpations: Abdomen is soft.     Tenderness: There is no abdominal tenderness.  Musculoskeletal:     Right lower leg: Swelling present.     Left lower leg: Swelling present.  Skin:    General: Skin is warm.     Findings: No rash.  Neurological:     Mental Status: She is alert and oriented to person, place, and time.     Comments: Resting tremor seen this morning in right hand and right foot    Data Reviewed: Labs reviewed  Family Communication: None at bedside  Disposition: Status is: Observation Physical therapy now recommending rehab.  Needed help with a hospital bed rail in order to get  herself out of bed today with Occupational Therapy  Planned Discharge Destination: Potential rehab versus home with home health    Time spent: 35 minutes Case discussed with TOC, OT, PT and nursing staff  Author: Laree Lock, MD 10/08/2024 8:28 AM  For on call review [email or web address].  "

## 2024-10-08 NOTE — TOC Progression Note (Signed)
 Transition of Care [name]    Patient Details  Name: Michele Brewer MRN: [id number] Date of Birth: [birth year]  Transition of [name]  [name], RN Phone Number: 10/08/2024, 12:03 PM  Clinical Narrative:     [name] [id number] A  FL2 sent for signature Referrals sent in HUB                    Expected Discharge Plan and Services                                               Social Drivers of Health (SDOH) Interventions SDOH Screenings   Food Insecurity: No Food Insecurity (10/05/2024)  Housing: Low Risk (10/05/2024)  Transportation Needs: No Transportation Needs (10/05/2024)  Utilities: Not At Risk (10/05/2024)  Financial Resource Strain: Low Risk  (06/12/2024)   Received from [facility]  Social Connections: Moderately Integrated (10/05/2024)  Recent Concern: Social Connections - Moderately Isolated (08/18/2024)  Tobacco Use: Low Risk (10/05/2024)    Readmission Risk Interventions     No data to display

## 2024-10-09 DIAGNOSIS — R531 Weakness: Secondary | ICD-10-CM | POA: Diagnosis not present

## 2024-10-09 NOTE — Progress Notes (Signed)
 Occupational Therapy Treatment Patient Details Name: Michele Brewer MRN: [id number] DOB: [birth year] Today's Date: 10/09/2024   History of present illness 74 y.o. female with medical history significant for Parkinson's disease, depression who came into ED complaining of generalized weakness, fall and not able to be taken care of at home.  Patient was recently admitted and discharged from [facility] between 09/16/2024 and 09/26/2023.  Patient was being planned to transfer to SNF during that admission but insurance denied and patient was discharged home. Patient stated she was living with her mother and sister. She was trying to get up but was not able to do so rather she fell landing on her knees.  She denies hitting her head.  Denies any trauma to her knees and other part of the body.  EMS was called and brought into the emergency room at [facility].   OT comments  Pt seen for OT treatment on this date. Upon arrival to room pt seated in chair, agreeable to tx session targeting improving functional activity tolerance in prep for ADL tasks. Pt is making progress towards goals as evidenced by requiring CGA for sit to stand with RW.  Close supervision requires for brushing teeth standing at sink with RW. Pt ambulated ~217ft CGA with RW. Trialed bed mobility techniques with compensatory techniques/AD with flat bed to simulate home set up. Attempted to simulate leg lifter with sheet, pt with increased difficulties compared to no sheet. Ultimately, multiple attempts were required to complete sit>supine with CGA. CGA required for supine>sit with increased ease.  Pt making good progress toward goals, will continue to follow POC. Recommend post acute OT follow up to facilitate optimal ADL/functional mobility performance.       If plan is discharge home, recommend the following:  A little help with walking and/or transfers;A little help with bathing/dressing/bathroom;Assistance with  cooking/housework;Assist for transportation;Help with stairs or ramp for entrance   Equipment Recommendations  Hospital bed    Recommendations for Other Services      Precautions / Restrictions Precautions Precautions: Fall Recall of Precautions/Restrictions: Intact Restrictions Weight Bearing Restrictions Per Provider Order: No       Mobility Bed Mobility Overal bed mobility: Needs Assistance                  Transfers Overall transfer level: Needs assistance Equipment used: Rolling walker (2 wheels) Transfers: Sit to/from Stand Sit to Stand: Contact guard assist                 Balance Overall balance assessment: Needs assistance Sitting-balance support: Feet supported, No upper extremity supported Sitting balance-Leahy Scale: Good     Standing balance support: Bilateral upper extremity supported, During functional activity, Reliant on assistive device for balance Standing balance-Leahy Scale: Good                             ADL either performed or assessed with clinical judgement   ADL Overall ADL's : Needs assistance/impaired                                       General ADL Comments: Supervision for grooming tasks standing at sink with RW (brushing teeth), intermttent vcs for technique, SET UP to eat lunch; Pt participated in functional mobility, amb approx 200' with RW with CGA and increased time; simulated toilet transfer bed>chair via step  pivot with RW with CGA, intermittent vcs for technique    Extremity/Trunk Assessment Upper Extremity Assessment Upper Extremity Assessment: Overall WFL for tasks assessed   Lower Extremity Assessment Lower Extremity Assessment: Generalized weakness        Vision       Perception     Praxis     Communication Communication Communication: No apparent difficulties   Cognition Arousal: Alert Behavior During Therapy: Flat affect Cognition: No apparent impairments                                Following commands: Intact Following commands impaired: Follows one step commands with increased time      Cueing   Cueing Techniques: Verbal cues  Exercises      Shoulder Instructions       General Comments VSS    Pertinent Vitals/ Pain       Pain Assessment Pain Assessment: No/denies pain  Home Living                                          Prior Functioning/Environment              Frequency  Min 2X/week        Progress Toward Goals  OT Goals(current goals can now be found in the care plan section)  Progress towards OT goals: Progressing toward goals  Acute Rehab OT Goals Patient Stated Goal: functionally improve OT Goal Formulation: With patient Time For Goal Achievement: 10/23/24 Potential to Achieve Goals: Good  Plan      Co-evaluation                 AM-PAC OT 6 Clicks Daily Activity     Outcome Measure   Help from another person eating meals?: None Help from another person taking care of personal grooming?: A Little Help from another person toileting, which includes using toliet, bedpan, or urinal?: A Little Help from another person bathing (including washing, rinsing, drying)?: A Little Help from another person to put on and taking off regular upper body clothing?: A Little Help from another person to put on and taking off regular lower body clothing?: A Little 6 Click Score: 19    End of Session Equipment Utilized During Treatment: Rolling walker (2 wheels);Gait belt  OT Visit Diagnosis: Other abnormalities of gait and mobility (R26.89);Muscle weakness (generalized) (M62.81)   Activity Tolerance Patient tolerated treatment well   Patient Left in chair;with call bell/phone within reach;with chair alarm set   Nurse Communication Mobility status        Time: 0214-0239 OT Time Calculation (min): 25 min  Charges: OT General Charges $OT Visit: 1 Visit OT  Treatments $Self Care/Home Management : 8-22 mins $Therapeutic Activity: 8-22 mins  Onie Hayashi OTS   Ronita Sauers 10/09/2024, 3:28 PM

## 2024-10-09 NOTE — TOC Progression Note (Signed)
 Transition of Care [facility]) - Progression Note    Patient Details  Name: Michele Brewer MRN: [id number] Date of Birth: 03-08-1951  Transition of Care [facility]) CM/SW Contact  Alvaro Louder, [place] Phone Number: 10/09/2024, 11:15 AM  Clinical Narrative:   LCSWA spoke to patient at the bedside. She selected SNF [facility]. LCSWA reached out to [employer] to start insurance auth for patient. Shara is pending.   TOC to follow for discharge.                       Expected Discharge Plan and Services                                               Social Drivers of Health (SDOH) Interventions SDOH Screenings   Food Insecurity: No Food Insecurity (10/05/2024)  Housing: Low Risk (10/05/2024)  Transportation Needs: No Transportation Needs (10/05/2024)  Utilities: Not At Risk (10/05/2024)  Financial Resource Strain: Low Risk  (06/12/2024)   Received from [facility]  Social Connections: Moderately Integrated (10/05/2024)  Recent Concern: Social Connections - Moderately Isolated (08/18/2024)  Tobacco Use: Low Risk (10/05/2024)    Readmission Risk Interventions     No data to display

## 2024-10-09 NOTE — Progress Notes (Signed)
 Physical Therapy Treatment Patient Details Name: Michele Brewer MRN: [id number] DOB: 25-May-1951 Today's Date: 10/09/2024   History of Present Illness 74 y.o. female with medical history significant for Parkinson's disease, depression who came into ED complaining of generalized weakness, fall and not able to be taken care of at home.  Patient was recently admitted and discharged from [facility] between 09/16/2024 and 09/26/2023.  Patient was being planned to transfer to SNF during that admission but insurance denied and patient was discharged home. Patient stated she was living with her mother and sister. She was trying to get up but was not able to do so rather she fell landing on her knees.  She denies hitting her head.  Denies any trauma to her knees and other part of the body.  EMS was called and brought into the emergency room at [facility].    PT Comments  Pt received up in chair this am, slightly more lethargic than previous day, however remains very motivated to participate in PT session. Pt required cues for safe transfer technique. Good tolerance for gait training with RW and increased cues for foot clearance as pt fatigues. Pt participated in seated B LE strengthening exercises with grossly 4-/5 strength throughout. Continue to recommend STR as pt is not at functional baseline currently.    If plan is discharge home, recommend the following: A little help with walking and/or transfers;A little help with bathing/dressing/bathroom;Assistance with cooking/housework;Assist for transportation;Help with stairs or ramp for entrance   Can travel by private vehicle     Yes  Equipment Recommendations  Hospital bed    Recommendations for Other Services       Precautions / Restrictions Precautions Precautions: Fall Recall of Precautions/Restrictions: Intact Restrictions Weight Bearing Restrictions Per Provider Order: No     Mobility  Bed Mobility               General  bed mobility comments:  (NT in chair pre/post session)    Transfers Overall transfer level: Needs assistance Equipment used: Rolling walker (2 wheels) Transfers: Sit to/from Stand Sit to Stand: Contact guard assist           General transfer comment:  (Cues for hand placement and wt shifting to power up to standing)    Ambulation/Gait Ambulation/Gait assistance: Contact guard assist, Supervision Gait Distance (Feet): 150 Feet Assistive device: Rolling walker (2 wheels) Gait Pattern/deviations: Step-through pattern, Decreased step length - right, Decreased step length - left, Decreased stride length, Shuffle, Decreased dorsiflexion - right, Decreased dorsiflexion - left Gait velocity: decreased     General Gait Details: slow, steady ambulation, decreased B foot clearance   Stairs             Wheelchair Mobility     Tilt Bed    Modified Rankin (Stroke Patients Only)       Balance Overall balance assessment: Needs assistance Sitting-balance support: No upper extremity supported, Feet supported Sitting balance-Leahy Scale: Good     Standing balance support: Bilateral upper extremity supported, During functional activity, Reliant on assistive device for balance Standing balance-Leahy Scale: Good Standing balance comment: no LOB with ambulation using RW                            Communication Communication Communication: Impaired Factors Affecting Communication: Reduced clarity of speech  Cognition Arousal: Lethargic Behavior During Therapy: Flat affect   PT - Cognitive impairments: No apparent impairments  PT - Cognition Comments: remains highly motivated with mobility Following commands: Intact Following commands impaired: Follows one step commands with increased time    Cueing Cueing Techniques: Verbal cues  Exercises General Exercises - Lower Extremity Ankle Circles/Pumps: AROM, Both, 10 reps Long Arc  Quad: AROM, Both, 10 reps Hip Flexion/Marching: AROM, Both, 20 reps, Seated Other Exercises Other Exercises:  (Pt educated on role of acute PT and plans to transition to STR in order to improve strength and safe mobility back to baseline)    General Comments        Pertinent Vitals/Pain Pain Assessment Pain Assessment: No/denies pain    Home Living                          Prior Function            PT Goals (current goals can now be found in the care plan section) Acute Rehab PT Goals Patient Stated Goal: To get stronger Progress towards PT goals: Progressing toward goals    Frequency    Min 2X/week      PT Plan      Co-evaluation              AM-PAC PT 6 Clicks Mobility   Outcome Measure  Help needed turning from your back to your side while in a flat bed without using bedrails?: A Little Help needed moving from lying on your back to sitting on the side of a flat bed without using bedrails?: A Little Help needed moving to and from a bed to a chair (including a wheelchair)?: A Little Help needed standing up from a chair using your arms (e.g., wheelchair or bedside chair)?: A Little Help needed to walk in hospital room?: A Little Help needed climbing 3-5 steps with a railing? : A Lot 6 Click Score: 17    End of Session Equipment Utilized During Treatment: Gait belt Activity Tolerance: Patient tolerated treatment well Patient left: in chair;with call bell/phone within reach;with chair alarm set Nurse Communication: Mobility status PT Visit Diagnosis: Muscle weakness (generalized) (M62.81)     Time: 1000-1033 PT Time Calculation (min) (ACUTE ONLY): 33 min  Charges:    $Gait Training: 8-22 mins $Therapeutic Exercise: 8-22 mins PT General Charges $$ ACUTE PT VISIT: 1 Visit                    Darice Bohr, PTA  Darice JAYSON Bohr 10/09/2024, 11:09 AM

## 2024-10-09 NOTE — Plan of Care (Signed)
  Problem: Activity: Goal: Risk for activity intolerance will decrease Outcome: Not Progressing    Problem: Safety: Goal: Ability to remain free from injury will improve Outcome: Not Progressing

## 2024-10-09 NOTE — Progress Notes (Signed)
 " Progress Note   Patient: Michele Brewer FMW:[id number] DOB: 1951-06-19 DOA: 10/05/2024     0 DOS: the patient was seen and examined on 10/09/2024   Brief hospital course: 74 y.o. female with medical history significant for Parkinson's disease, depression who came into ED complaining of generalized weakness, fall and not able to be taken care of at home.  Patient was recently admitted and discharged from [facility] between 09/16/2024 and 09/26/2023.  Patient was being planned to transfer to skilled nursing facility during that admission but there was denial from the insurance and patient was discharged home.  Patient stated that at home she was living with her mother and sister.  She was trying to get up but was not able to do so rather she fell landing on her knees.  She denies hitting her head.  Denies any trauma to her knees and other part of the body.  EMS was called and brought into the emergency room at [facility].  Patient denies any fever, chills, nausea, vomiting, chest pain, cough, shortness of breath, palpitations.   Hospitalist service was consulted for evaluation for admission for generalized weakness and fall at home.   Assessment and Plan: Generalized weakness Continue working with PT and OT.  Having difficulty getting out of the bed.   Patient has difficulty with transfer in and out of bed. PT recommended rehab, TOC to look into options. If she goes home will end up needing a functional hospital bed Unsafe discharge currently.   Orthostatic hypotension Continue midodrine    Lower extremity edema Continue Lasix  but increase to twice daily.   Protein-calorie malnutrition, moderate Continue supplements   Parkinson disease (HCC) Continue Sinemet    Chronic anemia Last hemoglobin 11.4    Subjective: Patient was examined at the bedside, no new complaints today Reports she is able to walk but has difficulty transfer in and out of bed. TOC looking for rehab  bed  Physical Exam: Vitals:   10/08/24 1451 10/08/24 2039 10/09/24 0331 10/09/24 0728  BP: (!) 94/55 (!) 98/51 125/63 111/60  Pulse: 71 72 62 70  Resp: 17 16 18 18   Temp: 97.7 F (36.5 C) 97.6 F (36.4 C) 98.2 F (36.8 C) 98.6 F (37 C)  TempSrc:      SpO2: 100% 100% 99% 100%  Weight:      Height:       Physical Exam HENT:     Head: Normocephalic.  Eyes:     General: Lids are normal.  Cardiovascular:     Rate and Rhythm: Normal rate and regular rhythm.     Heart sounds: Normal heart sounds, S1 normal and S2 normal.  Pulmonary:     Breath sounds: No decreased breath sounds, wheezing, rhonchi or rales.  Abdominal:     Palpations: Abdomen is soft.     Tenderness: There is no abdominal tenderness.  Musculoskeletal:     Right lower leg: Swelling present.     Left lower leg: Swelling present.  Skin:    General: Skin is warm.     Findings: No rash.  Neurological:     Mental Status: She is alert and oriented to person, place, and time.     Comments: Resting tremor seen this morning in right hand and right foot    Data Reviewed: Labs reviewed  Family Communication: None at bedside  Disposition: Status is: Observation Physical therapy now recommending rehab.  Needed help with a hospital bed rail in order to get herself out  of bed today with Occupational Therapy  Planned Discharge Destination: Potential rehab versus home with home health    Time spent: 35 minutes Case discussed with TOC, OT, PT and nursing staff  Author: Laree Lock, MD 10/09/2024 4:05 PM  For on call review [email or web address].  "

## 2024-10-09 NOTE — Plan of Care (Signed)
  Problem: Clinical Measurements: Goal: Ability to maintain clinical measurements within normal limits will improve Outcome: Progressing Goal: Will remain free from infection Outcome: Progressing   Problem: Activity: Goal: Risk for activity intolerance will decrease Outcome: Progressing   Problem: Coping: Goal: Level of anxiety will decrease Outcome: Progressing   

## 2024-10-10 DIAGNOSIS — R531 Weakness: Secondary | ICD-10-CM | POA: Diagnosis not present

## 2024-10-10 MED ORDER — FUROSEMIDE 40 MG PO TABS
40.0000 mg | ORAL_TABLET | Freq: Every day | ORAL | Status: DC
  Filled 2024-10-10: qty 1

## 2024-10-10 NOTE — Plan of Care (Signed)

## 2024-10-10 NOTE — Progress Notes (Signed)
 Pt received in chair, focused on mobility and gait training with RW in hall. Slow cadence, decreased heel strike bilaterally due to fatigue this pm. Attempted using gait belt as leg lifter during sit>supine, pt unable due to decreased strength and limited full knee extension. Pt will benefit from a newer hospital bed at home.  10/10/24 1600  PT Visit Information  Assistance Needed +1  History of Present Illness 74 y.o. female with medical history significant for Parkinson's disease, depression who came into ED complaining of generalized weakness, fall and not able to be taken care of at home.  Patient was recently admitted and discharged from [facility] between 09/16/2024 and 09/26/2023.  Patient was being planned to transfer to SNF during that admission but insurance denied and patient was discharged home. Patient stated she was living with her mother and sister. She was trying to get up but was not able to do so rather she fell landing on her knees.  She denies hitting her head.  Denies any trauma to her knees and other part of the body.  EMS was called and brought into the emergency room at [facility].  Subjective Data  Patient Stated Goal To get stronger  Precautions  Precautions Fall  Recall of Precautions/Restrictions Intact  Restrictions  Weight Bearing Restrictions Per Provider Order No  Cognition  Arousal Alert  Behavior During Therapy Flat affect  PT - Cognitive impairments No apparent impairments  PT - Cognition Comments remains highly motivated with mobility  Following Commands  Following commands Intact  Following commands impaired Follows one step commands with increased time  Cueing  Cueing Techniques Verbal cues  Communication  Communication No apparent difficulties  Factors Affecting Communication Reduced clarity of speech  Bed Mobility  General bed mobility comments  (Pt educated on using gait belt as a leg lifter, however attempted, and pt unable to  perform due to limited full knee extension and strength)  Transfers  Overall transfer level Needs assistance  Equipment used Rolling walker (2 wheels)  Transfers Sit to/from Stand  Sit to Stand Contact guard assist  General transfer comment sit<stand with increased time and effort, RW; no physical assistance from therapist required  Ambulation/Gait  Ambulation/Gait assistance Contact guard assist;Supervision  Gait Distance (Feet) 160 Feet  Assistive device Rolling walker (2 wheels)  Gait Pattern/deviations Step-through pattern;Decreased step length - right;Decreased step length - left;Decreased stride length;Shuffle;Decreased dorsiflexion - right;Decreased dorsiflexion - left  General Gait Details slow, steady ambulation, decreased B foot clearance  Gait velocity decreased  Balance  Overall balance assessment Needs assistance  Sitting-balance support Feet supported;No upper extremity supported  Sitting balance-Leahy Scale Good  Standing balance support Bilateral upper extremity supported;During functional activity;Reliant on assistive device for balance  Standing balance-Leahy Scale Good  Standing balance comment no LOB with ambulation using RW  PT - End of Session  Equipment Utilized During Treatment Gait belt  Activity Tolerance Patient tolerated treatment well  Patient left in chair;with call bell/phone within reach;with chair alarm set  Nurse Communication Mobility status   PT - Assessment/Plan  PT Visit Diagnosis Muscle weakness (generalized) (M62.81)  PT Frequency (ACUTE ONLY) Min 2X/week  Follow Up Recommendations Skilled nursing-short term rehab (<3 hours/day)  Can patient physically be transported by private vehicle Yes  Patient can return home with the following A little help with walking and/or transfers;A little help with bathing/dressing/bathroom;Assistance with cooking/housework;Assist for transportation;Help with stairs or ramp for entrance  PT equipment Hospital bed   AM-PAC PT 6  Clicks Mobility Outcome Measure (Version 2)  Help needed turning from your back to your side while in a flat bed without using bedrails? 3  Help needed moving from lying on your back to sitting on the side of a flat bed without using bedrails? 3  Help needed moving to and from a bed to a chair (including a wheelchair)? 3  Help needed standing up from a chair using your arms (e.g., wheelchair or bedside chair)? 3  Help needed to walk in hospital room? 3  Help needed climbing 3-5 steps with a railing?  2  6 Click Score 17  Consider Recommendation of Discharge To: Home with [facility]  Progressive Mobility  What is the highest level of mobility based on the mobility assessment? Level 4 (Ambulates with assistance) - Balance while stepping forward/back - Complete  Mobility Referral Yes  Activity Ambulated with assistance  PT Goal Progression  Progress towards PT goals Progressing toward goals  PT Time Calculation  PT Start Time (ACUTE ONLY) 1526  PT Stop Time (ACUTE ONLY) 1542  PT Time Calculation (min) (ACUTE ONLY) 16 min  PT General Charges  $$ ACUTE PT VISIT 1 Visit  PT Treatments  $Therapeutic Activity 8-22 mins   Darice Bohr, PTA 10/10/2024
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
## 2024-10-11 DIAGNOSIS — R531 Weakness: Secondary | ICD-10-CM | POA: Diagnosis not present

## 2024-10-11 LAB — BASIC METABOLIC PANEL WITH GFR
Anion gap: 7 (ref 5–15)
BUN: 16 mg/dL (ref 8–23)
CO2: 28 mmol/L (ref 22–32)
Calcium: 9.7 mg/dL (ref 8.9–10.3)
Chloride: 105 mmol/L (ref 98–111)
Creatinine, Ser: 0.65 mg/dL (ref 0.44–1.00)
GFR, Estimated: >60 mL/min
Glucose, Bld: 86 mg/dL (ref 70–99)
Potassium: 3.8 mmol/L (ref 3.5–5.1)
Sodium: 140 mmol/L (ref 135–145)

## 2024-10-11 LAB — CBC
HCT: 31.4 % — ABNORMAL LOW (ref 36.0–46.0)
Hemoglobin: 11.1 g/dL — ABNORMAL LOW (ref 12.0–15.0)
MCH: 32.9 pg (ref 26.0–34.0)
MCHC: 35.4 g/dL (ref 30.0–36.0)
MCV: 93.2 fL (ref 80.0–100.0)
Platelets: 157 10*3/uL (ref 150–400)
RBC: 3.37 MIL/uL — ABNORMAL LOW (ref 3.87–5.11)
RDW: 14.5 % (ref 11.5–15.5)
WBC: 2.7 10*3/uL — ABNORMAL LOW (ref 4.0–10.5)
nRBC: 0 % (ref 0.0–0.2)

## 2024-10-11 MED ORDER — FUROSEMIDE 20 MG PO TABS: 40.0000 mg | ORAL_TABLET | Freq: Every day | ORAL | Status: AC

## 2024-10-11 MED ORDER — CARBIDOPA-LEVODOPA 25-100 MG PO TABS: 1.5000 | ORAL_TABLET | Freq: Three times a day (TID) | ORAL | Status: AC
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
## 2024-10-11 NOTE — TOC Progression Note (Signed)
 Transition of Care [facility]) - Progression Note    Patient Details  Name: Michele Brewer MRN: [id number] Date of Birth: Jul 21, 1951  [facility]) CM/SW Contact  Racheal LITTIE Schimke, RN Phone Number: 10/11/2024, 2:58 PM  Clinical Narrative: CMRN spoke with Chrisine, patient's friend who will transport to [facility], room 611-A. TOC barriers resolved.       Barriers to Discharge: Barriers Resolved               Expected Discharge Plan and Services         Expected Discharge Date: 10/11/24               DME Arranged: N/A DME Agency: NA       HH Arranged: NA HH Agency: NA         Social Drivers of Health (SDOH) Interventions SDOH Screenings   Food Insecurity: No Food Insecurity (10/05/2024)  Housing: Low Risk (10/05/2024)  Transportation Needs: No Transportation Needs (10/05/2024)  Utilities: Not At Risk (10/05/2024)  Financial Resource Strain: Low Risk  (06/12/2024)   Received from [facility]  Social Connections: Moderately Integrated (10/05/2024)  Recent Concern: Social Connections - Moderately Isolated (08/18/2024)  Tobacco Use: Low Risk (10/05/2024)    Readmission Risk Interventions     No data to display

## 2024-10-11 NOTE — Plan of Care (Signed)

## 2024-10-14 NOTE — Progress Notes (Signed)
 Provider no longer with practice and unable to do the note
